# Patient Record
Sex: Female | Born: 1945
Health system: Southern US, Community
[De-identification: ages and names within clinical notes are randomized; demographics above are authoritative.]

## PROBLEM LIST (undated history)

## (undated) DIAGNOSIS — I340 Nonrheumatic mitral (valve) insufficiency: Secondary | ICD-10-CM

## (undated) DIAGNOSIS — M8580 Other specified disorders of bone density and structure, unspecified site: Secondary | ICD-10-CM

## (undated) DIAGNOSIS — Z9889 Other specified postprocedural states: Secondary | ICD-10-CM

## (undated) DIAGNOSIS — E041 Nontoxic single thyroid nodule: Secondary | ICD-10-CM

## (undated) DIAGNOSIS — Z923 Personal history of irradiation: Secondary | ICD-10-CM

## (undated) DIAGNOSIS — E039 Hypothyroidism, unspecified: Secondary | ICD-10-CM

## (undated) HISTORY — DX: Nontoxic single thyroid nodule: E04.1

## (undated) HISTORY — DX: Other specified disorders of bone density and structure, unspecified site: M85.80

## (undated) HISTORY — PX: BREAST EXCISIONAL BIOPSY: SUR124

## (undated) HISTORY — DX: Hypothyroidism, unspecified: E03.9

---

## 1997-11-05 ENCOUNTER — Other Ambulatory Visit: Admission: RE | Admit: 1997-11-05 | Discharge: 1997-11-05 | Payer: Self-pay | Admitting: Obstetrics and Gynecology

## 1997-12-19 ENCOUNTER — Other Ambulatory Visit: Admission: RE | Admit: 1997-12-19 | Discharge: 1997-12-19 | Payer: Self-pay | Admitting: Obstetrics and Gynecology

## 1998-12-04 ENCOUNTER — Encounter (INDEPENDENT_AMBULATORY_CARE_PROVIDER_SITE_OTHER): Payer: Self-pay

## 1998-12-04 ENCOUNTER — Other Ambulatory Visit: Admission: RE | Admit: 1998-12-04 | Discharge: 1998-12-04 | Payer: Self-pay | Admitting: Obstetrics and Gynecology

## 1999-08-27 ENCOUNTER — Encounter: Admission: RE | Admit: 1999-08-27 | Discharge: 1999-08-27 | Payer: Self-pay | Admitting: Obstetrics and Gynecology

## 1999-08-27 ENCOUNTER — Encounter: Payer: Self-pay | Admitting: Obstetrics and Gynecology

## 2000-08-30 ENCOUNTER — Encounter: Payer: Self-pay | Admitting: Obstetrics and Gynecology

## 2000-08-30 ENCOUNTER — Encounter: Admission: RE | Admit: 2000-08-30 | Discharge: 2000-08-30 | Payer: Self-pay | Admitting: Obstetrics and Gynecology

## 2001-09-01 ENCOUNTER — Encounter: Payer: Self-pay | Admitting: Obstetrics and Gynecology

## 2001-09-01 ENCOUNTER — Encounter: Admission: RE | Admit: 2001-09-01 | Discharge: 2001-09-01 | Payer: Self-pay | Admitting: Obstetrics and Gynecology

## 2002-04-17 ENCOUNTER — Other Ambulatory Visit: Admission: RE | Admit: 2002-04-17 | Discharge: 2002-04-17 | Payer: Self-pay | Admitting: Obstetrics and Gynecology

## 2002-09-06 ENCOUNTER — Encounter: Admission: RE | Admit: 2002-09-06 | Discharge: 2002-09-06 | Payer: Self-pay | Admitting: Obstetrics and Gynecology

## 2002-09-06 ENCOUNTER — Encounter: Payer: Self-pay | Admitting: Obstetrics and Gynecology

## 2002-09-08 ENCOUNTER — Encounter: Admission: RE | Admit: 2002-09-08 | Discharge: 2002-09-08 | Payer: Self-pay | Admitting: Obstetrics and Gynecology

## 2002-09-08 ENCOUNTER — Encounter: Payer: Self-pay | Admitting: Obstetrics and Gynecology

## 2002-11-06 ENCOUNTER — Encounter: Payer: Self-pay | Admitting: Family Medicine

## 2002-11-06 ENCOUNTER — Encounter: Admission: RE | Admit: 2002-11-06 | Discharge: 2002-11-06 | Payer: Self-pay | Admitting: Family Medicine

## 2003-09-10 ENCOUNTER — Encounter: Admission: RE | Admit: 2003-09-10 | Discharge: 2003-09-10 | Payer: Self-pay | Admitting: Obstetrics and Gynecology

## 2004-09-10 ENCOUNTER — Encounter: Admission: RE | Admit: 2004-09-10 | Discharge: 2004-09-10 | Payer: Self-pay | Admitting: Obstetrics and Gynecology

## 2004-09-25 ENCOUNTER — Ambulatory Visit (HOSPITAL_COMMUNITY): Admission: RE | Admit: 2004-09-25 | Discharge: 2004-09-25 | Payer: Self-pay | Admitting: Gastroenterology

## 2004-09-25 LAB — HM COLONOSCOPY

## 2005-09-18 ENCOUNTER — Encounter: Admission: RE | Admit: 2005-09-18 | Discharge: 2005-09-18 | Payer: Self-pay | Admitting: Obstetrics and Gynecology

## 2006-09-20 ENCOUNTER — Encounter: Admission: RE | Admit: 2006-09-20 | Discharge: 2006-09-20 | Payer: Self-pay | Admitting: Obstetrics and Gynecology

## 2006-11-10 ENCOUNTER — Inpatient Hospital Stay (HOSPITAL_COMMUNITY): Admission: EM | Admit: 2006-11-10 | Discharge: 2006-11-12 | Payer: Self-pay | Admitting: Emergency Medicine

## 2007-09-27 ENCOUNTER — Encounter: Admission: RE | Admit: 2007-09-27 | Discharge: 2007-09-27 | Payer: Self-pay | Admitting: Obstetrics and Gynecology

## 2007-10-06 ENCOUNTER — Encounter: Admission: RE | Admit: 2007-10-06 | Discharge: 2007-10-06 | Payer: Self-pay | Admitting: Obstetrics and Gynecology

## 2008-09-28 ENCOUNTER — Encounter: Admission: RE | Admit: 2008-09-28 | Discharge: 2008-09-28 | Payer: Self-pay | Admitting: Obstetrics and Gynecology

## 2009-09-30 ENCOUNTER — Encounter: Admission: RE | Admit: 2009-09-30 | Discharge: 2009-09-30 | Payer: Self-pay | Admitting: Obstetrics and Gynecology

## 2010-10-07 NOTE — Consult Note (Signed)
NAME:  Maria Gomez, Maria Gomez NO.:  [id number]   MEDICAL RECORD NO.:  [id number]          PATIENT TYPE:  EMS   LOCATION:  MAJO                         FACILITY:  [facility]   PHYSICIAN:  Ardeth Sportsman, MD     DATE OF BIRTH:  [birth year]   DATE OF CONSULTATION:  DATE OF DISCHARGE:                                 CONSULTATION   PRIMARY CARE PHYSICIAN:  I do not exactly for sure, I think it might be  Dr. Sigmund Hazel.   GYNECOLOGIST:  Dr. Carola Rhine.   REQUESTING PHYSICIAN:  Dr. Perrin Maltese from [facility].   SURGEON:  Dr. Michaell Cowing.   REASON FOR CONSULTATION:  Abdominal pain, possible appendicitis.   HISTORY OF PRESENT ILLNESS:  Maria Gomez is a 65 year old female,  otherwise, healthy who had sudden right lower quadrant abdominal pain  today.  It has become more intense.  It does not appear to radiate.  It  does not seem to get any worse with any or moving or twisting or  turning.  It progressed and based on concern, she went and saw Dr. Perrin Maltese  at the [facility].  He evaluated her and was concerned  about possible appendicitis and, therefore, he requested a consult for  me to evaluate the patient at [facility].   Patient has not had a history of really any abdominal pains before.  She  had a colonoscopy done about 3 years ago by Dr. Wandalee Ferdinand at [facility] and she recalls it being completely negative.  Apparently, her father had issues of bleeding diverticulosis and  stercoral ulcers with impaction and perforation and so was pretty  obsessed in making sure that family members had gotten their  colonoscopies.   Patient denies any sick contacts or travel history.  She has not really  had any nausea or vomiting and her appetite has been slightly down, but  she was able to tolerate food, including a sandwich around 6 hours ago  and actually is hungry right now.  No apparent bouts of hematemesis,  hematochezia or melena.   PAST  MEDICAL HISTORY:  1. Depression.  2. Negative colonoscopy in 2005 per patient.  3. Hypercholesterolemia.  4. Chronic constipation.   PAST SURGICAL HISTORY:  1. She had tonsils removed.  2. She has had left foot surgery.  3. She has had breast biopsies done on the right and on the left      breast.  One in the 1970s, one around 1991.  They were both benign.      She cannot recall the exact order of which one was which.   ALLERGIES:  NONE.   MEDICATIONS:  She takes:  1. Avicor daily.  2. Fish oil daily.  3. Garlic daily.  4. Sertraline 150 mg p.o. daily.  5. Aspirin 81 mg q.a.m.  6. Aspirin 325 mg q.h.s.  7. Prempro.  8. Calcium and vitamin D.  9. __________ to help with chronic constipation.   FAMILY HISTORY:  Notable for her father who had, at age 15, required  urgent colectomy for perforation  x2 secondary to probable obese syndrome  or stercoral ulceration.  No other family history of any other  significant GI problems.   SOCIAL HISTORY:  No tobacco, alcohol or drug use.  Her mother lives next  door to her and they are close.   REVIEW OF SYSTEMS:  As noted per HPI; otherwise, no constitutional,  ophthalmologic, ENT, cardiac, respiratory, GYN are negative.  Noted on  GYN, she had a full Pap smear in the past month and did not show any  abnormalities.  The rest of review of systems, musculoskeletal,  neurological, heme, lymph, hepatic, renal and endocrine, otherwise,  negative.  Heme, lymph and derm negative as well.   PHYSICAL EXAMINATION:  VITAL SIGNS:  Her pulse is in the 50s.  Respirations 16.  Blood pressure 112/73.  Temperature 97.6.  GENERAL:  She is a well-developed, well-nourished, slightly overweight  female in no acute distress.  Pleasant and chatty.  PSYCH:  Pleasant and interactive.  No evidence of any dementia,  delirium, psychosis or paranoia.  HEENT:  Eyes:  Pupils equal, round and reactive to light.  Extraocular  movements are intact.  Sclerae  nonicteric.  They are not injected.  She  is normocephalic with no facial asymmetry.  Mucous membranes are moist  and nasopharynx and oropharynx appear to be clear.  NECK:  Supple without any masses.  Trachea is midline.  LYMPH:  No head, neck, axillary or groin  lymphadenopathy.  BREASTS:  Deferred.  She has had a negative mammogram 2 months ago.  CHEST:  Clear to auscultation bilaterally.  No wheezes, rubs or rhonchi.  No pain to rib or sternal compression.  HEART:  Regular rate and rhythm.  Bradycardic, but no murmur, clicks or  rubs.  ABDOMEN:  Soft.  Slightly overweight.  She has a mild tenderness in the  right lower quadrant, but no rebound, tenderness or guarding to me.  No  periumbilical pain.  GENITAL:  Normal external female genitalia.  I did not do a formal  pelvic exam, given the recent negative pelvic exam.  RECTAL:  Deferred per patient's request.  EXTREMITIES:  No clubbing, cyanosis or edema.  MUSCULOSKELETAL:  Full range of motion of shoulders, elbows, wrists, as  well as hips, knees and ankles.  NEUROLOGICAL:  Cranial nerves II-XII are intact.  Hand grips 5/5 equal  and symmetrical and there is no resting or tension tremors.   STUDIES:  White count is 11.4, which is slightly elevated.  Her  chemistries are, otherwise, unremarkable.  She has a CT scan of the  abdomen from an outside facility, that was actually read with the  radiologist here and we both agree we can see appendix more towards the  midline.  She has sort of a nonadherent cecum in proximal descending  colon.  This is consistent with a cecum bascule.  Appendix is small, but  not inflamed.  Cecum and terminal ileum appear to be normal as well.  Colon is full of stool, but no definite bowel wall thickening concerning  for colitis.  No thickening of the terminal ileum concerning for Crohn.  She has some mild stranding of the proximal ascending colon, but no  definite 100% tic is actually seen.    ASSESSMENT/PLAN:  [name] with a sudden episode of  abdominal pain with the most likely etiology of diverticulitis given  negative GYN etiology and appendix is completely normal.   1. Admit.  2. IV antibiotics.  3. Sips only and  advance diet when white count normalizes and      abdominal pain resolves.  4. Follow transition over to oral antibiotics in the next day or so if      she makes rapid improvements.  5. Consider a repeat CT scan of the abdomen and pelvis with antero and      IV contrast in a couple of days if there is no significant      improvement.  6. Hypertension.  We will monitor closely.  7. Bradycardia.  We will get an EKG, although no evidence of a      myocardial infarction.  8. Serial abdominal exams and follow expectantly.   Anatomy and physiology of the digestive tract was explained.  Pathology  and etiology of diverticulosis and differential diagnoses explained as  well.  She and her mother are in agreement with this plan.      Ardeth Sportsman, MD  Electronically Signed     SCG/MEDQ  D:  11/10/2006  T:  11/11/2006  Job:  [id number]

## 2010-10-07 NOTE — Discharge Summary (Signed)
NAME:  HARTLEE, AMEDEE NO.:  [id number]   MEDICAL RECORD NO.:  [id number]          PATIENT TYPE:  INP   LOCATION:  5731                         FACILITY:  [facility]   PHYSICIAN:  Anselm Pancoast. Weatherly, M.D.DATE OF BIRTH:  1945/06/27   DATE OF ADMISSION:  11/10/2006  DATE OF DISCHARGE:  11/12/2006                               DISCHARGE SUMMARY   DISCHARGE DIAGNOSIS:  Probably a cecal diverticulitis.   OPERATIONS:  None.   HISTORY:  Maria Gomez is a 65 year old female otherwise healthy who  had a sudden onset of right lower abdominal pain on June 18, after a  bowel movement.  It does not radiate.  It did not appear to be getting  any worse.  And, she was seen by Dr. Perrin Maltese and a PA [facility].  He evaluated her and was concerned about a possible appendicitis  and requested a surgical consultation.  Dr. Michaell Cowing saw her in the  emergency room at [facility].  She has a past history of a negative  colonoscopy in 2006, by Dr. Evette Cristal, some history of chronic constipation,  and her evaluation was described as soft, slightly overweight, mild  tenderness in the right lower quadrant, no rebound, and no real obvious  surgical peritoneal signs.  Rectal and genitalia were described as  normal.  There was not actually a rectal exam at the patient's request.  The patient had a CT and the CT was described as diverticulitis of the  cecum, not an appendicitis.  And, she was admitted, placed on IV  antibiotics.  The described as possibly a kind of an ascending colitis  and then the following day she was seen by Dr. Carolynne Edouard, clinically was  improving, and a repeat white count was 8000, where her white count in  the [facility] had been 11,400.  She has been on now a liquid diet, is  tolerating it with no abdominal symptoms, and is ready for discharge at  this time.   She is going to be discharged on oral antibiotics, Cipro 500 mg b.i.d.  for 1 week and Flagyl 500 mg q.i.d.  for 1 week.  No pain medication.  She is instructed to get some MiraLax and if she is getting any  consistency of basically a firm stool to prevent it with the MiraLax.  She will be on low residue diet and will return to be seen in our office  to be seen either by Dr. Michaell Cowing.  Dr. Abbey Chatters did her father's  colectomy, and I could see her as needed.   I have not seen the CT but clinically she is fine and understands that  if she is having increasing pain she will call to be seen sooner.   I have also talked with Dr. Evette Cristal he did her colonoscopy 2 years ago and  the colonoscopy at that time described a perfectly normal cecum with no  evidence of any diverticulitis or questionable findings in the cecum of  2 years earlier.   She is discharged in improved condition and can return to normal work  and over the next 48 hours will advance her diet from full liquids to a  low residue diet.           ______________________________  Anselm Pancoast. Zachery Dakins, M.D.     WJW/MEDQ  D:  11/12/2006  T:  11/12/2006  Job:  [id number]   cc:   Ardeth Sportsman, MD  Graylin Shiver, M.D.

## 2010-10-10 NOTE — Op Note (Signed)
NAME:  Maria Gomez, Maria Gomez              ACCOUNT NO.:  [id number]   MEDICAL RECORD NO.:  [id number]          PATIENT TYPE:  AMB   LOCATION:  ENDO                         FACILITY:  [facility]   PHYSICIAN:  Graylin Shiver, M.D.   DATE OF BIRTH:  1945/07/19   DATE OF PROCEDURE:  09/25/2004  DATE OF DISCHARGE:                                 OPERATIVE REPORT   PROCEDURE:  Colonoscopy.   INDICATIONS:  Family history of colorectal cancer.   Informed consent was obtained after explanation of the risks of bleeding,  infection and perforation.   PREMEDICATION:  Fentanyl 60 mcg IV, Versed 6 milligrams IV.   PROCEDURE:  With the patient in the left lateral decubitus position, a  rectal exam was performed. No masses were felt. The Olympus colonoscope was  inserted into the rectum and advanced around the colon to the cecum.  Cecal  landmarks were identified.  The cecum and ascending colon looked normal. The  transverse colon looked normal. The descending colon looked normal. There  were a few diverticula in the sigmoid region. The rectum looked normal. She  tolerated the procedure well without complications.   IMPRESSION:  Diverticulosis.   PLAN:  I would recommend a follow-up colonoscopy again in five years due to  the family history.      SFG/MEDQ  D:  09/25/2004  T:  09/25/2004  Job:  [id number]   cc:   Sigmund Hazel, M.D.  [address]  [address] [place], [place] [address]  Fax: [phone]

## 2010-11-25 ENCOUNTER — Other Ambulatory Visit: Payer: Self-pay | Admitting: Physician Assistant

## 2010-11-25 DIAGNOSIS — Z1231 Encounter for screening mammogram for malignant neoplasm of breast: Secondary | ICD-10-CM

## 2010-12-08 ENCOUNTER — Ambulatory Visit
Admission: RE | Admit: 2010-12-08 | Discharge: 2010-12-08 | Disposition: A | Discharge status: Home / Self Care | Payer: Medicare Other | Source: Ambulatory Visit | Attending: Physician Assistant | Admitting: Physician Assistant

## 2010-12-08 DIAGNOSIS — Z1231 Encounter for screening mammogram for malignant neoplasm of breast: Secondary | ICD-10-CM

## 2011-03-11 LAB — CREATININE, SERUM: Creatinine, Ser: 1.05

## 2011-03-11 LAB — CBC
HCT: 37.9
MCHC: 34.2
RDW: 12.8
WBC: 8

## 2011-03-11 LAB — POTASSIUM: Potassium: 4.5

## 2012-03-22 DIAGNOSIS — Z0000 Encounter for general adult medical examination without abnormal findings: Secondary | ICD-10-CM | POA: Diagnosis not present

## 2012-03-22 DIAGNOSIS — E039 Hypothyroidism, unspecified: Secondary | ICD-10-CM | POA: Diagnosis not present

## 2012-03-22 DIAGNOSIS — E782 Mixed hyperlipidemia: Secondary | ICD-10-CM | POA: Diagnosis not present

## 2012-03-22 DIAGNOSIS — E559 Vitamin D deficiency, unspecified: Secondary | ICD-10-CM | POA: Diagnosis not present

## 2012-04-08 DIAGNOSIS — Z0000 Encounter for general adult medical examination without abnormal findings: Secondary | ICD-10-CM | POA: Diagnosis not present

## 2012-04-12 ENCOUNTER — Other Ambulatory Visit: Payer: Self-pay | Admitting: Physician Assistant

## 2012-04-12 DIAGNOSIS — Z780 Asymptomatic menopausal state: Secondary | ICD-10-CM

## 2012-04-13 ENCOUNTER — Other Ambulatory Visit: Payer: Self-pay | Admitting: Physician Assistant

## 2012-04-13 DIAGNOSIS — Z1231 Encounter for screening mammogram for malignant neoplasm of breast: Secondary | ICD-10-CM

## 2012-04-19 ENCOUNTER — Other Ambulatory Visit: Payer: Medicare Other

## 2012-06-02 ENCOUNTER — Ambulatory Visit
Admission: RE | Admit: 2012-06-02 | Discharge: 2012-06-02 | Disposition: A | Discharge status: Home / Self Care | Payer: Medicare Other | Source: Ambulatory Visit | Attending: Physician Assistant | Admitting: Physician Assistant

## 2012-06-02 DIAGNOSIS — M949 Disorder of cartilage, unspecified: Secondary | ICD-10-CM | POA: Diagnosis not present

## 2012-06-02 DIAGNOSIS — Z1231 Encounter for screening mammogram for malignant neoplasm of breast: Secondary | ICD-10-CM

## 2012-06-02 DIAGNOSIS — Z780 Asymptomatic menopausal state: Secondary | ICD-10-CM

## 2012-06-02 DIAGNOSIS — M899 Disorder of bone, unspecified: Secondary | ICD-10-CM | POA: Diagnosis not present

## 2012-12-01 ENCOUNTER — Other Ambulatory Visit: Payer: Self-pay | Admitting: Physician Assistant

## 2012-12-07 ENCOUNTER — Telehealth: Payer: Self-pay | Admitting: Physician Assistant

## 2012-12-07 MED ORDER — LEVOTHYROXINE SODIUM 25 MCG PO TABS: 25.0000 ug | ORAL_TABLET | Freq: Every day | ORAL | Status: DC

## 2012-12-07 NOTE — Telephone Encounter (Signed)
Ok to refill to last until CPE.

## 2012-12-07 NOTE — Telephone Encounter (Signed)
Medication refilled per protocol. 

## 2013-04-10 DIAGNOSIS — Z23 Encounter for immunization: Secondary | ICD-10-CM | POA: Diagnosis not present

## 2013-04-16 ENCOUNTER — Other Ambulatory Visit: Payer: Self-pay | Admitting: Family Medicine

## 2013-04-16 MED ORDER — LEVOTHYROXINE SODIUM 25 MCG PO TABS: 25.0000 ug | ORAL_TABLET | Freq: Every day | ORAL | Status: DC

## 2013-04-16 NOTE — Telephone Encounter (Signed)
.  Rx Refilled to [facility]

## 2013-05-15 ENCOUNTER — Other Ambulatory Visit: Payer: Self-pay

## 2013-05-15 DIAGNOSIS — Z1231 Encounter for screening mammogram for malignant neoplasm of breast: Secondary | ICD-10-CM

## 2013-06-13 ENCOUNTER — Ambulatory Visit
Admission: RE | Admit: 2013-06-13 | Discharge: 2013-06-13 | Disposition: A | Discharge status: Home / Self Care | Payer: Medicare Other | Source: Ambulatory Visit

## 2013-06-13 DIAGNOSIS — Z1231 Encounter for screening mammogram for malignant neoplasm of breast: Secondary | ICD-10-CM

## 2013-06-14 ENCOUNTER — Other Ambulatory Visit: Payer: Self-pay | Admitting: Physician Assistant

## 2013-06-14 DIAGNOSIS — R928 Other abnormal and inconclusive findings on diagnostic imaging of breast: Secondary | ICD-10-CM

## 2013-06-22 ENCOUNTER — Other Ambulatory Visit: Payer: Medicare Other

## 2013-06-22 DIAGNOSIS — E785 Hyperlipidemia, unspecified: Secondary | ICD-10-CM | POA: Diagnosis not present

## 2013-06-22 DIAGNOSIS — Z0000 Encounter for general adult medical examination without abnormal findings: Secondary | ICD-10-CM

## 2013-06-22 DIAGNOSIS — M8580 Other specified disorders of bone density and structure, unspecified site: Secondary | ICD-10-CM

## 2013-06-22 DIAGNOSIS — Z79899 Other long term (current) drug therapy: Secondary | ICD-10-CM | POA: Diagnosis not present

## 2013-06-22 DIAGNOSIS — E039 Hypothyroidism, unspecified: Secondary | ICD-10-CM

## 2013-06-22 DIAGNOSIS — E569 Vitamin deficiency, unspecified: Secondary | ICD-10-CM

## 2013-06-22 LAB — CBC WITH DIFFERENTIAL/PLATELET
BASOS ABS: 0 10*3/uL (ref 0.0–0.1)
BASOS PCT: 0 % (ref 0–1)
EOS ABS: 0.1 10*3/uL (ref 0.0–0.7)
EOS PCT: 1 % (ref 0–5)
HEMATOCRIT: 44.6 % (ref 36.0–46.0)
Hemoglobin: 15.2 g/dL — ABNORMAL HIGH (ref 12.0–15.0)
LYMPHS ABS: 1.9 10*3/uL (ref 0.7–4.0)
Lymphocytes Relative: 27 % (ref 12–46)
MCH: 30.7 pg (ref 26.0–34.0)
MCHC: 34.1 g/dL (ref 30.0–36.0)
MCV: 90.1 fL (ref 78.0–100.0)
MONO ABS: 0.5 10*3/uL (ref 0.1–1.0)
Monocytes Relative: 7 % (ref 3–12)
Neutro Abs: 4.6 10*3/uL (ref 1.7–7.7)
Neutrophils Relative %: 65 % (ref 43–77)
PLATELETS: 294 10*3/uL (ref 150–400)
RBC: 4.95 MIL/uL (ref 3.87–5.11)
RDW: 13.9 % (ref 11.5–15.5)
WBC: 7.1 10*3/uL (ref 4.0–10.5)

## 2013-06-22 LAB — LIPID PANEL
CHOL/HDL RATIO: 5.1 ratio
CHOLESTEROL: 257 mg/dL — AB (ref 0–200)
HDL: 50 mg/dL (ref >39)
LDL Cholesterol: 166 mg/dL — ABNORMAL HIGH (ref 0–99)
TRIGLYCERIDES: 204 mg/dL — AB (ref <150)
VLDL: 41 mg/dL — ABNORMAL HIGH (ref 0–40)

## 2013-06-22 LAB — COMPLETE METABOLIC PANEL WITH GFR
ALK PHOS: 82 U/L (ref 39–117)
ALT: 15 U/L (ref 0–35)
AST: 12 U/L (ref 0–37)
Albumin: 4.4 g/dL (ref 3.5–5.2)
BILIRUBIN TOTAL: 0.5 mg/dL (ref 0.2–1.2)
BUN: 19 mg/dL (ref 6–23)
CO2: 27 mEq/L (ref 19–32)
Calcium: 9.9 mg/dL (ref 8.4–10.5)
Chloride: 104 mEq/L (ref 96–112)
Creat: 1.05 mg/dL (ref 0.50–1.10)
GFR, EST AFRICAN AMERICAN: 64 mL/min
GFR, EST NON AFRICAN AMERICAN: 55 mL/min — AB
Glucose, Bld: 99 mg/dL (ref 70–99)
POTASSIUM: 4.9 meq/L (ref 3.5–5.3)
SODIUM: 139 meq/L (ref 135–145)
Total Protein: 7.4 g/dL (ref 6.0–8.3)

## 2013-06-22 LAB — TSH: TSH: 3.125 u[IU]/mL (ref 0.350–4.500)

## 2013-06-23 ENCOUNTER — Ambulatory Visit
Admission: RE | Admit: 2013-06-23 | Discharge: 2013-06-23 | Disposition: A | Discharge status: Home / Self Care | Payer: Medicare Other | Source: Ambulatory Visit | Attending: Physician Assistant | Admitting: Physician Assistant

## 2013-06-23 ENCOUNTER — Other Ambulatory Visit: Payer: Self-pay | Admitting: Physician Assistant

## 2013-06-23 DIAGNOSIS — R922 Inconclusive mammogram: Secondary | ICD-10-CM | POA: Diagnosis not present

## 2013-06-23 DIAGNOSIS — R928 Other abnormal and inconclusive findings on diagnostic imaging of breast: Secondary | ICD-10-CM

## 2013-06-26 ENCOUNTER — Ambulatory Visit (INDEPENDENT_AMBULATORY_CARE_PROVIDER_SITE_OTHER): Payer: Medicare Other | Admitting: Physician Assistant

## 2013-06-26 ENCOUNTER — Encounter: Payer: Self-pay | Admitting: Physician Assistant

## 2013-06-26 VITALS — BP 118/78 | HR 72 | Temp 98.1°F | Resp 18 | Ht 61.25 in | Wt 162.0 lb

## 2013-06-26 DIAGNOSIS — M949 Disorder of cartilage, unspecified: Secondary | ICD-10-CM

## 2013-06-26 DIAGNOSIS — E039 Hypothyroidism, unspecified: Secondary | ICD-10-CM

## 2013-06-26 DIAGNOSIS — M899 Disorder of bone, unspecified: Secondary | ICD-10-CM | POA: Diagnosis not present

## 2013-06-26 DIAGNOSIS — Z23 Encounter for immunization: Secondary | ICD-10-CM | POA: Diagnosis not present

## 2013-06-26 DIAGNOSIS — Z0000 Encounter for general adult medical examination without abnormal findings: Secondary | ICD-10-CM

## 2013-06-26 DIAGNOSIS — M8580 Other specified disorders of bone density and structure, unspecified site: Secondary | ICD-10-CM

## 2013-06-26 NOTE — Progress Notes (Signed)
Patient ID: Maria Gomez MRN: [id number], DOB: 1945-05-30, 68 y.o. Date of Encounter: 06/26/2013,   Chief Complaint: Physical (CPE)  HPI: 68 y.o. y/o female  here for CPE.   She has no complaints today. She did recently have a mammogram and is to go back this Friday for a biopsy. Otherwise, she has had no other medical issues over the past year since her last visit here.   Review of Systems: Consitutional: No fever, chills, fatigue, night sweats, lymphadenopathy. No significant/unexplained weight changes. Eyes: No visual changes, eye redness, or discharge. ENT/Mouth: No ear pain, sore throat, nasal drainage, or sinus pain. Cardiovascular: No chest pressure,heaviness, tightness or squeezing, even with exertion. No increased shortness of breath or dyspnea on exertion.No palpitations, edema, orthopnea, PND. Respiratory: No cough, hemoptysis, SOB, or wheezing. Gastrointestinal: No anorexia, dysphagia, reflux, pain, nausea, vomiting, hematemesis, diarrhea, constipation, BRBPR, or melena. Breast: No mass, nodules, bulging, or retraction. No skin changes or inflammation. No nipple discharge. No lymphadenopathy. Genitourinary: No dysuria, hematuria, incontinence, vaginal discharge, pruritis, burning, abnormal bleeding, or pain. Musculoskeletal: No decreased ROM, No joint pain or swelling. No significant pain in neck, back, or extremities. Skin: No rash, pruritis, or concerning lesions. Neurological: No headache, dizziness, syncope, seizures, tremors, memory loss, coordination problems, or paresthesias. Psychological: No anxiety, depression, hallucinations, SI/HI. Endocrine: No polydipsia, polyphagia, polyuria, or known diabetes.No increased fatigue. No palpitations/rapid heart rate. No significant/unexplained weight change. All other systems were reviewed and are otherwise negative.  Past Medical History  Diagnosis Date  . Hypothyroidism   . Osteopenia      History reviewed. No  pertinent past surgical history.  Home Meds:  Current Outpatient Prescriptions on File Prior to Visit  Medication Sig Dispense Refill  . levothyroxine (SYNTHROID, LEVOTHROID) 25 MCG tablet Take 1 tablet (25 mcg total) by mouth daily before breakfast.  90 tablet  1   No current facility-administered medications on file prior to visit.    Allergies:  Allergies  Allergen Reactions  . Niaspan [Niacin Er] Other (See Comments)    Severe flushing    History   Social History  . Marital Status: Married    Spouse Name: N/A    Number of Children: N/A  . Years of Education: N/A   Occupational History  . Not on file.   Social History Main Topics  . Smoking status: Never Smoker   . Smokeless tobacco: Never Used  . Alcohol Use: No  . Drug Use: No  . Sexual Activity: Not on file   Other Topics Concern  . Not on file   Social History Narrative   Lives with her mom.    Takes care of aunt who lives across the street.    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          Family History  Problem Relation Age of Onset  . Cancer Brother 19    ColoRectal Cancer    Physical Exam: Blood pressure 118/78, pulse 72, temperature 98.1 F (36.7 C), temperature source Oral, resp. rate 18, height 5' 1.25" (1.556 m), weight 162 lb (73.483 kg)., Body mass index is 30.35 kg/(m^2). General: Well developed, well nourished, WF. Appears  in no acute distress. HEENT: Normocephalic, atraumatic. Conjunctiva pink, sclera non-icteric. Pupils 2 mm constricting to 1 mm, round, regular, and equally reactive to light and accomodation. EOMI. Internal auditory canal clear. TMs with good cone of light and without pathology. Nasal mucosa pink. Nares are without  discharge. No sinus tenderness. Oral mucosa pink.  Pharynx without exudate.   Neck: Supple. Trachea midline. No thyromegaly. Full ROM. No lymphadenopathy.No Carotid Bruits. Lungs: Clear to auscultation bilaterally without wheezes,  rales, or rhonchi. Breathing is of normal effort and unlabored. Cardiovascular: RRR with S1 S2. No murmurs, rubs, or gallops. Distal pulses 2+ symmetrically. No carotid or abdominal bruits. Breast: Symmetrical. No masses. Nipples without discharge. Abdomen: Soft, non-tender, non-distended with normoactive bowel sounds. No hepatosplenomegaly or masses. No rebound/guarding. No CVA tenderness. No hernias.  Genitourinary:  External genitalia without lesions. Vaginal mucosa pink.No discharge present. Cervix pink and without discharge. No cervical tenderness.Normal uterus size. No adnexal mass or tenderness.  Musculoskeletal: Full range of motion and 5/5 strength throughout. Without swelling, atrophy, tenderness, crepitus, or warmth. Extremities without clubbing, cyanosis, or edema. Calves supple. Skin: Warm and moist without erythema, ecchymosis, wounds, or rash. Neuro: A+Ox3. CN II-XII grossly intact. Moves all extremities spontaneously. Full sensation throughout. Normal gait. DTR 2+ throughout upper and lower extremities. Finger to nose intact. Psych:  Responds to questions appropriately with a normal affect.   Assessment/Plan:  67 y.o. y/o female here for CPE 1. Visit for preventive health examination   A. Screening Labs:  Results for orders placed in visit on 06/22/13  TSH      Result Value Range   TSH 3.125  0.350 - 4.500 uIU/mL  LIPID PANEL      Result Value Range   Cholesterol 257 (*) 0 - 200 mg/dL   Triglycerides 204 (*) <150 mg/dL   HDL 50  >39 mg/dL   Total CHOL/HDL Ratio 5.1     VLDL 41 (*) 0 - 40 mg/dL   LDL Cholesterol 166 (*) 0 - 99 mg/dL  CBC WITH DIFFERENTIAL      Result Value Range   WBC 7.1  4.0 - 10.5 K/uL   RBC 4.95  3.87 - 5.11 MIL/uL   Hemoglobin 15.2 (*) 12.0 - 15.0 g/dL   HCT 44.6  36.0 - 46.0 %   MCV 90.1  78.0 - 100.0 fL   MCH 30.7  26.0 - 34.0 pg   MCHC 34.1  30.0 - 36.0 g/dL   RDW 13.9  11.5 - 15.5 %   Platelets 294  150 - 400 K/uL   Neutrophils Relative  % 65  43 - 77 %   Neutro Abs 4.6  1.7 - 7.7 K/uL   Lymphocytes Relative 27  12 - 46 %   Lymphs Abs 1.9  0.7 - 4.0 K/uL   Monocytes Relative 7  3 - 12 %   Monocytes Absolute 0.5  0.1 - 1.0 K/uL   Eosinophils Relative 1  0 - 5 %   Eosinophils Absolute 0.1  0.0 - 0.7 K/uL   Basophils Relative 0  0 - 1 %   Basophils Absolute 0.0  0.0 - 0.1 K/uL   Smear Review Criteria for review not met    COMPLETE METABOLIC PANEL WITH GFR      Result Value Range   Sodium 139  135 - 145 mEq/L   Potassium 4.9  3.5 - 5.3 mEq/L   Chloride 104  96 - 112 mEq/L   CO2 27  19 - 32 mEq/L   Glucose, Bld 99  70 - 99 mg/dL   BUN 19  6 - 23 mg/dL   Creat 1.05  0.50 - 1.10 mg/dL   Total Bilirubin 0.5  0.2 - 1.2 mg/dL   Alkaline Phosphatase 82  39 - 117   U/L   AST 12  0 - 37 U/L   ALT 15  0 - 35 U/L   Total Protein 7.4  6.0 - 8.3 g/dL   Albumin 4.4  3.5 - 5.2 g/dL   Calcium 9.9  8.4 - 10.5 mg/dL   GFR, Est African American 64     GFR, Est Non African American 55 (*)     Cholesterol numbers are the same as they were at the last check. She is kind of borderline of considering statin therapy. Discussed this with her. Will continue to try to decrease saturated fats in the diet and continue her walking.  B. Pap: Last Pap smear was performed here 02/27/2011. Cytology was normal. There was no HPV cotesting.  Latest guidelines state that no further pap indicated after age 65, as last pap nml and no h/o abnormality.   C. Screening Mammogram: Patient recently had mammogram which showed some abnormality. She is scheduled for followup biopsy this Friday.  D. DEXA/BMD:  At her last physical she had never had a DEXA/BMD. Placed referral for this and she had this performed 06/02/2012.  Showed osteopenia in both sites.  Lumbar spine: T score: -1.7 Left hip: T score: -2.1 See # 3 below.   E. Colorectal Cancer Screening: Last colonoscopy was performed by Dr. Ganem 09/25/2004. I had her sign a release to try to obtain records  at the last visit but did not get the records.  Today I have told her to call Dr. Ganem's office to determine when followup is due.   She has a brother who died of colorectal cancer at age 57. Therefore I think she is probably due for followup. Again, I discussed this with her today and told her to call there for followup.  F. Immunizations:  Influenza: She reports that she has already gotten the influenza vaccine for this season. Tetanus: Up-to-date. Given 04/08/2012 Pneumococcal: Pneumovax was given 02/2011                              Will give Prevnar 13 today.  Zostavax:  She has received this 05/20/2012    2. Hypothyroidism Recent TSH is within normal limits. Continue current dose.  3. Osteopenia She does report that her mother has osteoporosis. Discussed with her her mother has had any fractures. He says her mother has only had an ankle fracture. Says that this occurred after she fell on a wet floor and experienced a hard fall. Patient does not smoke. She has been on no chronic steroid treatment. She is now doing weight-bearing exercise and walking 1 mile daily. She is on calcium and vitamin D. We can wait and monitor this and hold off on starting any prescription medications right now.  4. Need for prophylactic vaccination against Streptococcus pneumoniae (pneumococcus) - Pneumococcal conjugate vaccine 13-valent   Signed, Mary Beth Dixon, PA, BSFM 06/26/2013 2:09 PM    

## 2013-06-29 ENCOUNTER — Ambulatory Visit
Admission: RE | Admit: 2013-06-29 | Discharge: 2013-06-29 | Disposition: A | Discharge status: Home / Self Care | Payer: Medicare Other | Source: Ambulatory Visit | Attending: Physician Assistant | Admitting: Physician Assistant

## 2013-06-29 DIAGNOSIS — R928 Other abnormal and inconclusive findings on diagnostic imaging of breast: Secondary | ICD-10-CM

## 2013-06-29 DIAGNOSIS — N6019 Diffuse cystic mastopathy of unspecified breast: Secondary | ICD-10-CM | POA: Diagnosis not present

## 2013-06-29 DIAGNOSIS — N6029 Fibroadenosis of unspecified breast: Secondary | ICD-10-CM | POA: Diagnosis not present

## 2013-09-06 ENCOUNTER — Other Ambulatory Visit: Payer: Self-pay | Admitting: Physician Assistant

## 2013-09-06 NOTE — Telephone Encounter (Signed)
Medication refilled per protocol. 

## 2013-10-27 ENCOUNTER — Encounter: Payer: Self-pay | Admitting: Family Medicine

## 2013-11-06 ENCOUNTER — Ambulatory Visit: Payer: Medicare Other | Admitting: Physician Assistant

## 2013-11-08 ENCOUNTER — Ambulatory Visit: Payer: Medicare Other | Admitting: Physician Assistant

## 2013-11-13 ENCOUNTER — Ambulatory Visit (INDEPENDENT_AMBULATORY_CARE_PROVIDER_SITE_OTHER): Payer: Medicare Other | Admitting: Physician Assistant

## 2013-11-13 ENCOUNTER — Encounter: Payer: Self-pay | Admitting: Physician Assistant

## 2013-11-13 VITALS — BP 108/76 | HR 60 | Temp 98.5°F | Resp 18 | Wt 162.0 lb

## 2013-11-13 DIAGNOSIS — K921 Melena: Secondary | ICD-10-CM

## 2013-11-13 DIAGNOSIS — K5900 Constipation, unspecified: Secondary | ICD-10-CM

## 2013-11-13 DIAGNOSIS — Z800 Family history of malignant neoplasm of digestive organs: Secondary | ICD-10-CM

## 2013-11-13 NOTE — Progress Notes (Signed)
Patient ID: Maria Gomez MRN: [id number], DOB: Oct 14, 1945, 68 y.o. Date of Encounter: 11/13/2013, 12:57 PM    Chief Complaint:  Chief Complaint  Patient presents with  . thinks she has colon cancer    prob w/constipation, bloody stools, "pencil like stools",      HPI: 68 y.o. year old white female with the above complaint.  She says that she has been noticing these changes over the last one to 2 months. Says that back around 2 months ago she had a period of time when her stools were thin  and " pencil-like."     Says that they are now back to normal caliber since she has adjusted her diet and is eating more spinach and beans. Says that she also was having some problems with constipation. Says about 2 weeks ago she had a episode where she had a real difficult time with her bowel movement. Says that when she finally got the stool to come out she saw some red blood. Because of all of this she was concerned that she could have colon cancer. She has a brother who died of colon cancer.  says that he was diagnosed with colon cancer around age 50. She has had no melena. No maroon colored stools and no black tarry stools. Has not seen red blood except for that one time. Has had no lightheadedness.     Home Meds:   Outpatient Prescriptions Prior to Visit  Medication Sig Dispense Refill  . Biotin 1 MG CAPS Take 1 capsule by mouth daily.      . Calcium Carbonate-Vitamin D (CALCIUM + D PO) Take 500 mg by mouth daily.      . Coenzyme Q10 (COQ10) 200 MG CAPS Take 1 capsule by mouth daily.      . Cyanocobalamin (CVS B-12) 1500 MCG TBDP Take 1 tablet by mouth daily.      Marland Kitchen levothyroxine (SYNTHROID, LEVOTHROID) 25 MCG tablet TAKE 1 TABLET  BY MOUTH DAILY BEFORE BREAKFAST.  90 tablet  1  . Multiple Vitamin (MULTIVITAMIN) tablet Take 1 tablet by mouth daily. Vitality      . OMEGA-3 KRILL OIL PO Take 1 capsule by mouth daily.      Marland Kitchen OVER THE COUNTER MEDICATION Take 1 tablet by mouth daily.  Cell Wise Broad Spectrum Antioxident      . Probiotic Product (PROBIOTIC DAILY PO) Take 1 capsule by mouth daily. Florify       No facility-administered medications prior to visit.    Allergies:  Allergies  Allergen Reactions  . Niaspan [Niacin Er] Other (See Comments)    Severe flushing      Review of Systems: See HPI for pertinent ROS. All other ROS negative.    Physical Exam: Blood pressure 108/76, pulse 60, temperature 98.5 F (36.9 C), temperature source Oral, resp. rate 18, weight 162 lb (73.483 kg)., Body mass index is 30.35 kg/(m^2). General:  WNWD WF. Appears in no acute distress. Neck: Supple. No thyromegaly. No lymphadenopathy. Lungs: Clear bilaterally to auscultation without wheezes, rales, or rhonchi. Breathing is unlabored. Heart: Regular rhythm. No murmurs, rubs, or gallops. Abdomen: Soft, non-tender, non-distended with normoactive bowel sounds. No hepatomegaly. No rebound/guarding. No obvious abdominal masses. Msk:  Strength and tone normal for age. Extremities/Skin: Warm and dry. No clubbing or cyanosis. No edema. No rashes or suspicious lesions. Neuro: Alert and oriented X 3. Moves all extremities spontaneously. Gait is normal. CNII-XII grossly in tact. Psych:  Responds to questions appropriately with  a normal affect.     ASSESSMENT AND PLAN:  68 y.o. year old female with  1. Family history of colon cancer - Ambulatory referral to Gastroenterology  2. Hematochezia - Ambulatory referral to Gastroenterology  3. Unspecified constipation - Ambulatory referral to Gastroenterology  Last colonoscopy was 09/25/2004 by Dr. Penelope Coop. Colonoscopy report shows diverticulosis. He has on the report that he would recommend followup colonoscopy in 5 years due to her family history. Given this recommendation, she is actually overdue for colonoscopy anyway. Refer her back to Dr. Penelope Coop for followup colonoscopy. I recommended obtaining a CBC today to make sure she has not  developed any anemia but she defers this. She is to follow up immediately if she develops any lightheadedness or further symptoms.  [address] [place], [place], [facility] 11/13/2013 12:57 PM

## 2013-11-21 DIAGNOSIS — K625 Hemorrhage of anus and rectum: Secondary | ICD-10-CM | POA: Diagnosis not present

## 2013-12-14 DIAGNOSIS — L259 Unspecified contact dermatitis, unspecified cause: Secondary | ICD-10-CM | POA: Diagnosis not present

## 2013-12-21 DIAGNOSIS — L259 Unspecified contact dermatitis, unspecified cause: Secondary | ICD-10-CM | POA: Diagnosis not present

## 2013-12-29 ENCOUNTER — Telehealth: Payer: Self-pay | Admitting: Physician Assistant

## 2013-12-29 NOTE — Telephone Encounter (Signed)
Patient is calling to let us know that the [facility] pharmacy needs authorization for her [place], [place] [phone] (Phone) [phone] (Fax)

## 2014-01-01 NOTE — Telephone Encounter (Signed)
Need authorizations please, can't do it because of [email or web address] does not have me on record for [place].

## 2014-01-02 MED ORDER — LEVOTHYROXINE SODIUM 25 MCG PO TABS: ORAL_TABLET | ORAL | Status: DC

## 2014-01-02 NOTE — Telephone Encounter (Signed)
Cal placed to pharmacy.   Was advised that no PA required, patient requesting refill. Stated that refill noted to prescription, but patient had not yet called in to have it delivered.   States that patient must call pharmacy to request delivery at [phone].  Call placed to patient. Maria Gomez.

## 2014-01-11 DIAGNOSIS — K625 Hemorrhage of anus and rectum: Secondary | ICD-10-CM | POA: Diagnosis not present

## 2014-01-11 DIAGNOSIS — Z800 Family history of malignant neoplasm of digestive organs: Secondary | ICD-10-CM | POA: Diagnosis not present

## 2014-01-11 DIAGNOSIS — K5730 Diverticulosis of large intestine without perforation or abscess without bleeding: Secondary | ICD-10-CM | POA: Diagnosis not present

## 2014-02-28 ENCOUNTER — Other Ambulatory Visit: Payer: Self-pay | Admitting: Physician Assistant

## 2014-03-01 NOTE — Telephone Encounter (Signed)
Medication refilled per protocol. 

## 2014-03-29 DIAGNOSIS — Z23 Encounter for immunization: Secondary | ICD-10-CM | POA: Diagnosis not present

## 2014-06-12 ENCOUNTER — Other Ambulatory Visit: Payer: Self-pay

## 2014-06-12 DIAGNOSIS — Z1231 Encounter for screening mammogram for malignant neoplasm of breast: Secondary | ICD-10-CM

## 2014-06-19 ENCOUNTER — Ambulatory Visit: Payer: Medicare Other

## 2014-06-27 ENCOUNTER — Ambulatory Visit
Admission: RE | Admit: 2014-06-27 | Discharge: 2014-06-27 | Disposition: A | Discharge status: Home / Self Care | Payer: Medicare Other | Source: Ambulatory Visit

## 2014-06-27 ENCOUNTER — Other Ambulatory Visit: Payer: Medicare Other

## 2014-06-27 DIAGNOSIS — E785 Hyperlipidemia, unspecified: Secondary | ICD-10-CM

## 2014-06-27 DIAGNOSIS — Z1231 Encounter for screening mammogram for malignant neoplasm of breast: Secondary | ICD-10-CM

## 2014-06-27 DIAGNOSIS — Z0000 Encounter for general adult medical examination without abnormal findings: Secondary | ICD-10-CM | POA: Diagnosis not present

## 2014-06-27 DIAGNOSIS — E039 Hypothyroidism, unspecified: Secondary | ICD-10-CM

## 2014-06-27 DIAGNOSIS — Z79899 Other long term (current) drug therapy: Secondary | ICD-10-CM

## 2014-06-27 DIAGNOSIS — M8580 Other specified disorders of bone density and structure, unspecified site: Secondary | ICD-10-CM

## 2014-06-27 LAB — COMPLETE METABOLIC PANEL WITH GFR
ALT: 14 U/L (ref 0–35)
AST: 12 U/L (ref 0–37)
Albumin: 3.9 g/dL (ref 3.5–5.2)
Alkaline Phosphatase: 74 U/L (ref 39–117)
BILIRUBIN TOTAL: 0.5 mg/dL (ref 0.2–1.2)
BUN: 14 mg/dL (ref 6–23)
CALCIUM: 9.4 mg/dL (ref 8.4–10.5)
CO2: 25 mEq/L (ref 19–32)
CREATININE: 0.98 mg/dL (ref 0.50–1.10)
Chloride: 101 mEq/L (ref 96–112)
GFR, Est African American: 69 mL/min
GFR, Est Non African American: 59 mL/min — ABNORMAL LOW
Glucose, Bld: 92 mg/dL (ref 70–99)
Potassium: 4.2 mEq/L (ref 3.5–5.3)
Sodium: 136 mEq/L (ref 135–145)
Total Protein: 6.7 g/dL (ref 6.0–8.3)

## 2014-06-27 LAB — CBC WITH DIFFERENTIAL/PLATELET
BASOS ABS: 0 10*3/uL (ref 0.0–0.1)
Basophils Relative: 0 % (ref 0–1)
Eosinophils Absolute: 0.1 10*3/uL (ref 0.0–0.7)
Eosinophils Relative: 1 % (ref 0–5)
HEMATOCRIT: 44 % (ref 36.0–46.0)
Hemoglobin: 14.7 g/dL (ref 12.0–15.0)
Lymphocytes Relative: 22 % (ref 12–46)
Lymphs Abs: 1.9 10*3/uL (ref 0.7–4.0)
MCH: 30.8 pg (ref 26.0–34.0)
MCHC: 33.4 g/dL (ref 30.0–36.0)
MCV: 92.1 fL (ref 78.0–100.0)
MPV: 9.9 fL (ref 8.6–12.4)
Monocytes Absolute: 0.4 10*3/uL (ref 0.1–1.0)
Monocytes Relative: 5 % (ref 3–12)
NEUTROS PCT: 72 % (ref 43–77)
Neutro Abs: 6.1 10*3/uL (ref 1.7–7.7)
PLATELETS: 269 10*3/uL (ref 150–400)
RBC: 4.78 MIL/uL (ref 3.87–5.11)
RDW: 14.2 % (ref 11.5–15.5)
WBC: 8.5 10*3/uL (ref 4.0–10.5)

## 2014-06-27 LAB — LIPID PANEL
CHOL/HDL RATIO: 5.3 ratio
Cholesterol: 259 mg/dL — ABNORMAL HIGH (ref 0–200)
HDL: 49 mg/dL (ref >39)
LDL CALC: 155 mg/dL — AB (ref 0–99)
TRIGLYCERIDES: 277 mg/dL — AB (ref <150)
VLDL: 55 mg/dL — ABNORMAL HIGH (ref 0–40)

## 2014-06-27 LAB — TSH: TSH: 1.885 u[IU]/mL (ref 0.350–4.500)

## 2014-07-04 ENCOUNTER — Encounter: Payer: Self-pay | Admitting: Physician Assistant

## 2014-07-04 ENCOUNTER — Ambulatory Visit (INDEPENDENT_AMBULATORY_CARE_PROVIDER_SITE_OTHER): Payer: Medicare Other | Admitting: Physician Assistant

## 2014-07-04 VITALS — BP 112/70 | HR 60 | Temp 98.2°F | Resp 18 | Ht 62.0 in | Wt 162.0 lb

## 2014-07-04 DIAGNOSIS — J988 Other specified respiratory disorders: Secondary | ICD-10-CM | POA: Diagnosis not present

## 2014-07-04 DIAGNOSIS — E039 Hypothyroidism, unspecified: Secondary | ICD-10-CM | POA: Diagnosis not present

## 2014-07-04 DIAGNOSIS — B9689 Other specified bacterial agents as the cause of diseases classified elsewhere: Secondary | ICD-10-CM

## 2014-07-04 DIAGNOSIS — M8580 Other specified disorders of bone density and structure, unspecified site: Secondary | ICD-10-CM

## 2014-07-04 DIAGNOSIS — Z0000 Encounter for general adult medical examination without abnormal findings: Secondary | ICD-10-CM

## 2014-07-04 MED ORDER — AZITHROMYCIN 250 MG PO TABS: ORAL_TABLET | ORAL | Status: DC

## 2014-07-04 NOTE — Progress Notes (Signed)
Patient ID: Maria Gomez MRN: [id number], DOB: September 09, 1945, 69 y.o. Date of Encounter: 07/04/2014,   Chief Complaint: Physical (CPE)  HPI: 69 y.o. y/o female  here for CPE.   Today she reports that she has had a cough for about one month. Says that she has had no nasal congestion or mucus from the nose.  She has no other complaints today.  Review of Systems: Consitutional: No fever, chills, fatigue, night sweats, lymphadenopathy. No significant/unexplained weight changes. Eyes: No visual changes, eye redness, or discharge. ENT/Mouth: No ear pain, sore throat, nasal drainage, or sinus pain. Cardiovascular: No chest pressure,heaviness, tightness or squeezing, even with exertion. No increased shortness of breath or dyspnea on exertion.No palpitations, edema, orthopnea, PND. Respiratory: No cough, hemoptysis, SOB, or wheezing. Gastrointestinal: No anorexia, dysphagia, reflux, pain, nausea, vomiting, hematemesis, diarrhea, constipation, BRBPR, or melena. Breast: No mass, nodules, bulging, or retraction. No skin changes or inflammation. No nipple discharge. No lymphadenopathy. Genitourinary: No dysuria, hematuria, incontinence, vaginal discharge, pruritis, burning, abnormal bleeding, or pain. Musculoskeletal: No decreased ROM, No joint pain or swelling. No significant pain in neck, back, or extremities. Skin: No rash, pruritis, or concerning lesions. Neurological: No headache, dizziness, syncope, seizures, tremors, memory loss, coordination problems, or paresthesias. Psychological: No anxiety, depression, hallucinations, SI/HI. Endocrine: No polydipsia, polyphagia, polyuria, or known diabetes.No increased fatigue. No palpitations/rapid heart rate. No significant/unexplained weight change. All other systems were reviewed and are otherwise negative.  Past Medical History  Diagnosis Date  . Hypothyroidism   . Osteopenia      History reviewed. No pertinent past surgical  history.  Home Meds:  Current Outpatient Prescriptions on File Prior to Visit  Medication Sig Dispense Refill  . Biotin 1 MG CAPS Take 1 capsule by mouth daily.    . Calcium Carbonate-Vitamin D (CALCIUM + D PO) Take 500 mg by mouth daily.    . Coenzyme Q10 (COQ10) 200 MG CAPS Take 1 capsule by mouth daily.    . Cyanocobalamin (CVS B-12) 1500 MCG TBDP Take 1 tablet by mouth daily.    Marland Kitchen levothyroxine (SYNTHROID, LEVOTHROID) 25 MCG tablet TAKE 1 TABLET EVERY DAY BEFORE BREAKFAST 90 tablet 1  . Multiple Vitamin (MULTIVITAMIN) tablet Take 1 tablet by mouth daily. Vitality    . Probiotic Product (PROBIOTIC DAILY PO) Take 1 capsule by mouth daily. Florify    . OMEGA-3 KRILL OIL PO Take 1 capsule by mouth daily.    Marland Kitchen OVER THE COUNTER MEDICATION Take 1 tablet by mouth daily. Cell Wise Broad Spectrum Antioxident     No current facility-administered medications on file prior to visit.    Allergies:  Allergies  Allergen Reactions  . Niaspan [Niacin Er] Other (See Comments)    Severe flushing    History   Social History  . Marital Status: Married    Spouse Name: N/A  . Number of Children: N/A  . Years of Education: N/A   Occupational History  . Not on file.   Social History Main Topics  . Smoking status: Never Smoker   . Smokeless tobacco: Never Used  . Alcohol Use: No  . Drug Use: No  . Sexual Activity: Not on file   Other Topics Concern  . Not on file   Social History Narrative   Lives with her mom.    Takes care of aunt who lives across the street.    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  Family History  Problem Relation Age of Onset  . Cancer Brother 34    ColoRectal Cancer    Physical Exam: Blood pressure 112/70, pulse 60, temperature 98.2 F (36.8 C), temperature source Oral, resp. rate 18, height '5\' 2"'  (1.575 m), weight 162 lb (73.483 kg)., Body mass index is 29.62 kg/(m^2). General: Well developed, well nourished, WF.  Appears  in no acute distress. HEENT: Normocephalic, atraumatic. Conjunctiva pink, sclera non-icteric. Pupils 2 mm constricting to 1 mm, round, regular, and equally reactive to light and accomodation. EOMI. Internal auditory canal clear. TMs with good cone of light and without pathology. Nasal mucosa pink. Nares are without discharge. No sinus tenderness. Oral mucosa pink.  Pharynx without exudate.   Neck: Supple. Trachea midline. No thyromegaly. Full ROM. No lymphadenopathy.No Carotid Bruits. Lungs: Clear to auscultation bilaterally without wheezes, rales, or rhonchi. Breathing is of normal effort and unlabored. Cardiovascular: RRR with S1 S2. No murmurs, rubs, or gallops. Distal pulses 2+ symmetrically. No carotid or abdominal bruits. Breast: Symmetrical. No masses. Nipples without discharge. Abdomen: Soft, non-tender, non-distended with normoactive bowel sounds. No hepatosplenomegaly or masses. No rebound/guarding. No CVA tenderness. No hernias.  Genitourinary: Pt deferred today. Had Pelvic Exam here 06/2013-pt now 69 y/o. Musculoskeletal: Full range of motion and 5/5 strength throughout. Without swelling, atrophy, tenderness, crepitus, or warmth. Extremities without clubbing, cyanosis, or edema.  Skin: Warm and moist without erythema, ecchymosis, wounds, or rash. Neuro: A+Ox3. CN II-XII grossly intact. Moves all extremities spontaneously. Full sensation throughout. Normal gait. Psych:  Responds to questions appropriately with a normal affect.   Assessment/Plan:  69 y.o. y/o female here for CPE 1. Visit for preventive health examination   A. Screening Labs:  Results for orders placed or performed in visit on 06/27/14  CBC with Differential/Platelet  Result Value Ref Range   WBC 8.5 4.0 - 10.5 K/uL   RBC 4.78 3.87 - 5.11 MIL/uL   Hemoglobin 14.7 12.0 - 15.0 g/dL   HCT 44.0 36.0 - 46.0 %   MCV 92.1 78.0 - 100.0 fL   MCH 30.8 26.0 - 34.0 pg   MCHC 33.4 30.0 - 36.0 g/dL   RDW 14.2 11.5 -  15.5 %   Platelets 269 150 - 400 K/uL   MPV 9.9 8.6 - 12.4 fL   Neutrophils Relative % 72 43 - 77 %   Neutro Abs 6.1 1.7 - 7.7 K/uL   Lymphocytes Relative 22 12 - 46 %   Lymphs Abs 1.9 0.7 - 4.0 K/uL   Monocytes Relative 5 3 - 12 %   Monocytes Absolute 0.4 0.1 - 1.0 K/uL   Eosinophils Relative 1 0 - 5 %   Eosinophils Absolute 0.1 0.0 - 0.7 K/uL   Basophils Relative 0 0 - 1 %   Basophils Absolute 0.0 0.0 - 0.1 K/uL   Smear Review Criteria for review not met   Lipid panel  Result Value Ref Range   Cholesterol 259 (H) 0 - 200 mg/dL   Triglycerides 277 (H) <150 mg/dL   HDL 49 >39 mg/dL   Total CHOL/HDL Ratio 5.3 Ratio   VLDL 55 (H) 0 - 40 mg/dL   LDL Cholesterol 155 (H) 0 - 99 mg/dL  TSH  Result Value Ref Range   TSH 1.885 0.350 - 4.500 uIU/mL  COMPLETE METABOLIC PANEL WITH GFR  Result Value Ref Range   Sodium 136 135 - 145 mEq/L   Potassium 4.2 3.5 - 5.3 mEq/L   Chloride 101 96 - 112 mEq/L  CO2 25 19 - 32 mEq/L   Glucose, Bld 92 70 - 99 mg/dL   BUN 14 6 - 23 mg/dL   Creat 0.98 0.50 - 1.10 mg/dL   Total Bilirubin 0.5 0.2 - 1.2 mg/dL   Alkaline Phosphatase 74 39 - 117 U/L   AST 12 0 - 37 U/L   ALT 14 0 - 35 U/L   Total Protein 6.7 6.0 - 8.3 g/dL   Albumin 3.9 3.5 - 5.2 g/dL   Calcium 9.4 8.4 - 10.5 mg/dL   GFR, Est African American 69 mL/min   GFR, Est Non African American 59 (L) mL/min    Cholesterol numbers are the same as they were at the last check. She is kind of borderline of considering statin therapy. Discussed this with her. Will continue to try to decrease saturated fats in the diet and continue her walking. At [place] 06/2014 she was a little frustrated because she says that every night her mom eats ice cream and her mom frequently eats cake and patient eats neither of these. However at this visit we did discuss the fact that she does eat a lot of cheese and sour cream and casseroles. Discussed trying to decrease these foods. She does not want to start statin therapy  at this time.  B. Pap: Last Pap smear was performed here 02/27/2011. Cytology was normal. There was no HPV cotesting.  Latest guidelines state that no further pap indicated after age 24, as last pap nml and no h/o abnormality.  She had pelvic exam here 06/2013 which was normal. Defers repeat at visit 06/2014 and she is now age 69.  C. Screening Mammogram: She just recently had mammogram 06/27/2014--negative. Repeat one year.  D. DEXA/BMD:  At recent physical she had never had a DEXA/BMD. Placed referral for this and she had this performed 06/02/2012.  Showed osteopenia in both sites.  Lumbar spine: T score: -1.7 Left hip: T score: -2.1 See # 3 below.--" "3 . Osteopenia She does report that her mother has osteoporosis. Discussed with her her mother has had any fractures. He says her mother has only had an ankle fracture. Says that this occurred after she fell on a wet floor and experienced a hard fall. Patient does not smoke. She has been on no chronic steroid treatment. She is now doing weight-bearing exercise and walking 1 mile daily. She is on calcium and vitamin D. We can wait and monitor this and hold off on starting any prescription medications right now.    E. Colorectal Cancer Screening: She had colonoscopy  performed by Dr. Penelope Coop 09/25/2004. She had another colonoscopy performed 01/11/2014. Found this listed in [place] but was unable to find the actual report and results to tell when she is supposed to have repeat.  F. Immunizations:  Influenza: She reports that she has already gotten the influenza vaccine for this season. Tetanus: Up-to-date. Given 04/08/2012 Pneumococcal: Pneumovax was given 02/2011                             Prevnar 13 --given here 06/26/2013  Zostavax:  She has received this 05/20/2012    2. Hypothyroidism Recent TSH is within normal limits. Continue current dose.  3. Osteopenia She does report that her mother has osteoporosis. Discussed with her her mother  has had any fractures. He says her mother has only had an ankle fracture. Says that this occurred after she fell on a wet floor  and experienced a hard fall. Patient does not smoke. She has been on no chronic steroid treatment. She is now doing weight-bearing exercise and walking 1 mile daily. She is on calcium and vitamin D. We can wait and monitor this and hold off on starting any prescription medications right now.   4. Bacterial respiratory infection - azithromycin (ZITHROMAX) 250 MG tablet; Day 1: Take 2 daily.  Days 2-5: Take 1 daily.  Dispense: 6 tablet; Refill: 0   Subjective:   Patient presents for Medicare Annual/Subsequent preventive examination.   Review Past Medical/Family/Social: These are all documented in Epic and are all reviewed today.  Risk Factors  Current exercise habits:  Walking Dietary issues discussed: Discussed decreasing cheese sour cream and casseroles today. See note under lipids above.  Cardiac risk factors: Age, Lipids   Depression Screen  (Note: if answer to either of the following is "Yes", a more complete depression screening is indicated)  Over the past two weeks, have you felt down, depressed or hopeless? No Over the past two weeks, have you felt little interest or pleasure in doing things? No Have you lost interest or pleasure in daily life? No Do you often feel hopeless? No Do you cry easily over simple problems? No   Activities of Daily Living  In your present state of health, do you have any difficulty performing the following activities?:  Driving? No  Managing money? No  Feeding yourself? No  Getting from bed to chair? No  Climbing a flight of stairs? No  Preparing food and eating?: No  Bathing or showering? No  Getting dressed: No  Getting to the toilet? No  Using the toilet:No  Moving around from place to place: No  In the past year have you fallen or had a near fall?:No  Are you sexually active? No  Do you have more than one  partner? No   Hearing Difficulties: No  Do you often ask people to speak up or repeat themselves? No  Do you experience ringing or noises in your ears? No Do you have difficulty understanding soft or whispered voices? No  Do you feel that you have a problem with memory? No Do you often misplace items? No  Do you feel safe at home? Yes  Cognitive Testing  Alert? Yes Normal Appearance?Yes  Oriented to person? Yes Place? Yes  Time? Yes  Recall of three objects? Yes  Can perform simple calculations? Yes  Displays appropriate judgment?Yes  Can read the correct time from a watch face?Yes   List the Names of Other Physician/Practitioners you currently use:  GI--In EPic  Indicate any recent Medical Services you may have received from other than [facility] providers in the past year (date may be approximate).   Screening Tests / Date Colonoscopy --all of this information is listed above.                    Zostavax  Mammogram  Influenza Vaccine  Tetanus/tdap    Assessment:    Annual wellness medicare exam   Plan:    During the course of the visit the patient was educated and counseled about appropriate screening and preventive services including:  Screening mammography  Colorectal cancer screening  Shingles vaccine. Prescription given to that she can get the vaccine at the pharmacy or Medicare part D.  Screen + for depression. PHQ- 9 score of 12 (moderate depression). We discussed the options of counseling versus possibly a medication. I encouraged her strongly think  about the counseling. She is going through some medical problems currently and her husband is as well Mrs. been very stressful for her. She says she will think about it. She does have Xanax to use as needed. Though she may benefit from an SSRI for her more depressive type symptoms but she wants to hold off at this time.  I aksed her to please have her cardioloist send records since we have none on file.  Diet review for  nutrition referral? Yes ____ Not Indicated __x__  Patient Instructions (the written plan) was given to the patient.  Medicare Attestation  I have personally reviewed:  The patient's medical and social history  Their use of alcohol, tobacco or illicit drugs  Their current medications and supplements  The patient's functional ability including ADLs,fall risks, home safety risks, cognitive, and hearing and visual impairment  Diet and physical activities  Evidence for depression or mood disorders  The patient's weight, height, BMI, and visual acuity have been recorded in the chart. I have made referrals, counseling, and provided education to the patient based on review of the above and I have provided the patient with a written personalized care plan for preventive services.        Signed, [address] [place], [place], [facility] 07/04/2014 9:40 AM

## 2014-07-10 ENCOUNTER — Telehealth: Payer: Self-pay | Admitting: Physician Assistant

## 2014-07-10 MED ORDER — LEVOFLOXACIN 750 MG PO TABS: 750.0000 mg | ORAL_TABLET | Freq: Every day | ORAL | Status: DC

## 2014-07-10 NOTE — Telephone Encounter (Signed)
Patient calling to say she is not better from taking her azithromycin would like a call back regarding this   [phone]

## 2014-07-10 NOTE — Telephone Encounter (Signed)
Called patient.  States cough has not improved.  Has gone through [place], [place].  Please advise.

## 2014-07-10 NOTE — Telephone Encounter (Signed)
Levaquin 750mg 1 po QD x 7 days. # 7 + 0. 

## 2014-07-10 NOTE — Telephone Encounter (Signed)
Pt aware of change in ABX.  If still not better will need to see back in office

## 2014-07-17 ENCOUNTER — Other Ambulatory Visit: Payer: Self-pay | Admitting: Physician Assistant

## 2014-07-18 NOTE — Telephone Encounter (Signed)
Medication refilled per protocol. 

## 2014-08-01 ENCOUNTER — Ambulatory Visit
Admission: RE | Admit: 2014-08-01 | Discharge: 2014-08-01 | Disposition: A | Discharge status: Home / Self Care | Payer: Medicare Other | Source: Ambulatory Visit | Attending: Physician Assistant | Admitting: Physician Assistant

## 2014-08-01 ENCOUNTER — Encounter: Payer: Self-pay | Admitting: Physician Assistant

## 2014-08-01 ENCOUNTER — Ambulatory Visit (INDEPENDENT_AMBULATORY_CARE_PROVIDER_SITE_OTHER): Payer: Medicare Other | Admitting: Physician Assistant

## 2014-08-01 ENCOUNTER — Telehealth: Payer: Self-pay | Admitting: *Deleted

## 2014-08-01 ENCOUNTER — Other Ambulatory Visit: Payer: Self-pay | Admitting: Physician Assistant

## 2014-08-01 VITALS — BP 108/78 | HR 76 | Temp 98.2°F | Resp 18 | Wt 162.0 lb

## 2014-08-01 DIAGNOSIS — R05 Cough: Secondary | ICD-10-CM

## 2014-08-01 DIAGNOSIS — R053 Chronic cough: Secondary | ICD-10-CM

## 2014-08-01 DIAGNOSIS — R9389 Abnormal findings on diagnostic imaging of other specified body structures: Secondary | ICD-10-CM

## 2014-08-01 DIAGNOSIS — R059 Cough, unspecified: Secondary | ICD-10-CM

## 2014-08-01 MED ORDER — PREDNISONE 20 MG PO TABS: 20.0000 mg | ORAL_TABLET | Freq: Two times a day (BID) | ORAL | Status: DC

## 2014-08-01 NOTE — Progress Notes (Signed)
Patient ID: BRYNNAN RODENBAUGH MRN: [id number], DOB: 09-24-1945, 69 y.o. Date of Encounter: 08/01/2014, 11:07 AM    Chief Complaint:  Chief Complaint  Patient presents with  . persisitant cough x 3 mth    rt lung very painful     HPI: 69 y.o. year old white female is here for follow-up.  I reviewed my office visit note dated 07/04/14. At that visit she reported that she had been experiencing a cough for about one month. She had no nasal congestion or mucus from the nose, but just congested cough. At that visit my exam showed that her lung exam was normal. At that visit I prescribed azithromycin Z-Pak.  Patient states that she took the Z-Pak as directed but says that she got no relief at all and so no change in her symptoms even after completion of that.  She called [place] on 07/10/14 reporting that she had not gotten symptom relief after completing the Z-Pak. At that time I prescribed Levaquin 750 mg daily 7 days. Patient states that she did take the Levaquin as directed and completed all of it. However, she states that she again saw no decrease in symptoms-- no change at all even after completion of Levaquin.  States that when she is just sitting at rest she feels okay. However says that she gets up to do any type of work at all, she feels pain "in her right lung "  and points underneath her right breast and towards her right back at that same level.  At that time she also starts to cough. Says that she is coughing up no phlegm.  Says that otherwise she does not feel bad. Only feels bad when she gets up to exert herself and develops that pain and cough.  Has had no fevers or chills.     Home Meds:   Outpatient Prescriptions Prior to Visit  Medication Sig Dispense Refill  . Biotin 1 MG CAPS Take 1 capsule by mouth daily.    . Calcium Carbonate-Vitamin D (CALCIUM + D PO) Take 500 mg by mouth daily.    . Coenzyme Q10 (COQ10) 200 MG CAPS Take 1 capsule by mouth daily.    .  Cyanocobalamin (CVS B-12) 1500 MCG TBDP Take 1 tablet by mouth daily.    Marland Kitchen levothyroxine (SYNTHROID, LEVOTHROID) 25 MCG tablet TAKE 1 TABLET EVERY DAY BEFORE BREAKFAST 90 tablet 1  . Multiple Vitamin (MULTIVITAMIN) tablet Take 1 tablet by mouth daily. Vitality    . OMEGA-3 KRILL OIL PO Take 1 capsule by mouth daily.    . Probiotic Product (PROBIOTIC DAILY PO) Take 1 capsule by mouth daily. Florify    . levofloxacin (LEVAQUIN) 750 MG tablet Take 1 tablet (750 mg total) by mouth daily. 7 tablet 0  . OVER THE COUNTER MEDICATION Take 1 tablet by mouth daily. Cell Wise Broad Spectrum Antioxident     No facility-administered medications prior to visit.    Allergies:  Allergies  Allergen Reactions  . Niaspan [Niacin Er] Other (See Comments)    Severe flushing      Review of Systems: See HPI for pertinent ROS. All other ROS negative.    Physical Exam: Blood pressure 108/78, pulse 76, temperature 98.2 F (36.8 C), temperature source Oral, resp. rate 18, weight 162 lb (73.483 kg), SpO2 98 %., Body mass index is 29.62 kg/(m^2). General: WNWD WF.  Appears in no acute distress. Neck: Supple. No thyromegaly. No lymphadenopathy. Lungs: Clear bilaterally to auscultation without wheezes,  rales, or rhonchi. Breathing is unlabored. Lungs are clear with no wheezing or rhonchi. She does cough several times during the visit and it does sound like a deep cough.  (Not a hacky cough that you would usually see with post nasal drip or GERD) Heart: Regular rhythm. No murmurs, rubs, or gallops. Msk:  Strength and tone normal for age. Extremities/Skin: Warm and dry.  Neuro: Alert and oriented X 3. Moves all extremities spontaneously. Gait is normal. CNII-XII grossly in tact. Psych:  Responds to questions appropriately with a normal affect.     ASSESSMENT AND PLAN:  69 y.o. year old female with  1. Cough Will obtain chest x-ray and then follow-up with patient once I get these results.  NOTE::She does  cough several times during the visit and it does sound like a deep cough.  (Not a hacky cough that you would usually see with post nasal drip or GERD)  07/04/14--prescribed a Z-Pak 07/10/14---prescribed Levaquin  - DG Chest 2 View; Future   Signed, Olean Ree [place], [place], [facility] 08/01/2014 11:07 AM

## 2014-08-01 NOTE — Telephone Encounter (Signed)
Pt has appointment at [place] on Fri march 11 at 3pm arrive 2:40pm, pt needs to bring ID and insurance. Left message on both mobile and home numbers to return my call

## 2014-08-02 ENCOUNTER — Telehealth: Payer: Self-pay | Admitting: Family Medicine

## 2014-08-02 NOTE — Telephone Encounter (Signed)
I agree with documentation from [facility]. I recommend that she have CT.

## 2014-08-02 NOTE — Telephone Encounter (Signed)
(  continuation of note from below)  Has started to feel some better and feels maybe will not need CT.  Maybe wait until has finished steroid and see how she feels.  I told her good that she feels better but has had cough for a long time and has abnormal chest xray.  Steroid will probably make her feel better from all the irritation from the cough but need to find out what underlying cause is.  Reinforced abnormal CXR.  Patient understood, does still want your advise.  Does have CT tomorrow and have told her NOT to cancel that appt.

## 2014-08-02 NOTE — Telephone Encounter (Signed)
Per phone note by Maudie Mercury pt aware of appt

## 2014-08-02 NOTE — Telephone Encounter (Signed)
Pt called concerned about having CT tomorrow.  Maria Gomez has started steroids and already is feeling some better.

## 2014-08-02 NOTE — Telephone Encounter (Signed)
Pt called.  Understands need for CT as planned

## 2014-08-03 ENCOUNTER — Ambulatory Visit
Admission: RE | Admit: 2014-08-03 | Discharge: 2014-08-03 | Disposition: A | Discharge status: Home / Self Care | Payer: Medicare Other | Source: Ambulatory Visit | Attending: Physician Assistant | Admitting: Physician Assistant

## 2014-08-03 DIAGNOSIS — R05 Cough: Secondary | ICD-10-CM

## 2014-08-03 DIAGNOSIS — K449 Diaphragmatic hernia without obstruction or gangrene: Secondary | ICD-10-CM | POA: Diagnosis not present

## 2014-08-03 DIAGNOSIS — R053 Chronic cough: Secondary | ICD-10-CM

## 2014-08-03 DIAGNOSIS — R0602 Shortness of breath: Secondary | ICD-10-CM | POA: Diagnosis not present

## 2014-08-03 DIAGNOSIS — R9389 Abnormal findings on diagnostic imaging of other specified body structures: Secondary | ICD-10-CM

## 2014-12-04 ENCOUNTER — Other Ambulatory Visit: Payer: Self-pay | Admitting: Physician Assistant

## 2014-12-04 NOTE — Telephone Encounter (Signed)
Medication refilled per protocol. 

## 2014-12-07 ENCOUNTER — Telehealth: Payer: Self-pay | Admitting: Physician Assistant

## 2014-12-07 NOTE — Telephone Encounter (Signed)
Left message NTBS

## 2014-12-07 NOTE — Telephone Encounter (Signed)
Patient was prescribed prednisone last march and would like to know if this can be prescribed again for her by chance for her ongoing cough [phone]

## 2015-02-02 DIAGNOSIS — L255 Unspecified contact dermatitis due to plants, except food: Secondary | ICD-10-CM | POA: Diagnosis not present

## 2015-02-15 ENCOUNTER — Other Ambulatory Visit: Payer: Self-pay | Admitting: Physician Assistant

## 2015-02-15 NOTE — Telephone Encounter (Signed)
Refill appropriate and filled per protocol. 

## 2015-03-22 DIAGNOSIS — Z23 Encounter for immunization: Secondary | ICD-10-CM | POA: Diagnosis not present

## 2015-04-16 ENCOUNTER — Other Ambulatory Visit: Payer: Self-pay | Admitting: Physician Assistant

## 2015-04-16 NOTE — Telephone Encounter (Signed)
Medication refilled per protocol. 

## 2015-06-17 ENCOUNTER — Other Ambulatory Visit: Payer: Self-pay | Admitting: Physician Assistant

## 2015-06-18 ENCOUNTER — Other Ambulatory Visit: Payer: Self-pay

## 2015-06-18 DIAGNOSIS — Z1231 Encounter for screening mammogram for malignant neoplasm of breast: Secondary | ICD-10-CM

## 2015-07-16 ENCOUNTER — Other Ambulatory Visit: Payer: Medicare Other

## 2015-07-16 ENCOUNTER — Other Ambulatory Visit: Payer: Self-pay | Admitting: Family Medicine

## 2015-07-16 DIAGNOSIS — E559 Vitamin D deficiency, unspecified: Secondary | ICD-10-CM | POA: Diagnosis not present

## 2015-07-16 DIAGNOSIS — Z79899 Other long term (current) drug therapy: Secondary | ICD-10-CM

## 2015-07-16 DIAGNOSIS — Z0000 Encounter for general adult medical examination without abnormal findings: Secondary | ICD-10-CM | POA: Diagnosis not present

## 2015-07-16 DIAGNOSIS — E039 Hypothyroidism, unspecified: Secondary | ICD-10-CM

## 2015-07-16 DIAGNOSIS — M8580 Other specified disorders of bone density and structure, unspecified site: Secondary | ICD-10-CM

## 2015-07-16 LAB — CBC WITH DIFFERENTIAL/PLATELET
Basophils Absolute: 0 10*3/uL (ref 0.0–0.1)
Basophils Relative: 0 % (ref 0–1)
Eosinophils Absolute: 0.1 10*3/uL (ref 0.0–0.7)
Eosinophils Relative: 2 % (ref 0–5)
HEMATOCRIT: 43.2 % (ref 36.0–46.0)
HEMOGLOBIN: 14.3 g/dL (ref 12.0–15.0)
LYMPHS PCT: 35 % (ref 12–46)
Lymphs Abs: 2.1 10*3/uL (ref 0.7–4.0)
MCH: 30.8 pg (ref 26.0–34.0)
MCHC: 33.1 g/dL (ref 30.0–36.0)
MCV: 92.9 fL (ref 78.0–100.0)
MONO ABS: 0.4 10*3/uL (ref 0.1–1.0)
MONOS PCT: 7 % (ref 3–12)
MPV: 10.1 fL (ref 8.6–12.4)
NEUTROS ABS: 3.3 10*3/uL (ref 1.7–7.7)
Neutrophils Relative %: 56 % (ref 43–77)
Platelets: 256 10*3/uL (ref 150–400)
RBC: 4.65 MIL/uL (ref 3.87–5.11)
RDW: 14 % (ref 11.5–15.5)
WBC: 5.9 10*3/uL (ref 4.0–10.5)

## 2015-07-17 LAB — LIPID PANEL
Cholesterol: 231 mg/dL — ABNORMAL HIGH (ref 125–200)
HDL: 51 mg/dL (ref >=46)
LDL CALC: 149 mg/dL — AB (ref <130)
TRIGLYCERIDES: 154 mg/dL — AB (ref <150)
Total CHOL/HDL Ratio: 4.5 Ratio (ref <=5.0)
VLDL: 31 mg/dL — ABNORMAL HIGH (ref <30)

## 2015-07-17 LAB — COMPLETE METABOLIC PANEL WITH GFR
ALBUMIN: 4.1 g/dL (ref 3.6–5.1)
ALK PHOS: 75 U/L (ref 33–130)
ALT: 10 U/L (ref 6–29)
AST: 13 U/L (ref 10–35)
BUN: 18 mg/dL (ref 7–25)
CALCIUM: 9.4 mg/dL (ref 8.6–10.4)
CHLORIDE: 104 mmol/L (ref 98–110)
CO2: 26 mmol/L (ref 20–31)
Creat: 1 mg/dL — ABNORMAL HIGH (ref 0.50–0.99)
GFR, EST AFRICAN AMERICAN: 66 mL/min (ref >=60)
GFR, EST NON AFRICAN AMERICAN: 58 mL/min — AB (ref >=60)
Glucose, Bld: 94 mg/dL (ref 70–99)
POTASSIUM: 4.6 mmol/L (ref 3.5–5.3)
Sodium: 139 mmol/L (ref 135–146)
Total Bilirubin: 0.4 mg/dL (ref 0.2–1.2)
Total Protein: 6.8 g/dL (ref 6.1–8.1)

## 2015-07-17 LAB — TSH: TSH: 2.66 mIU/L

## 2015-07-17 LAB — VITAMIN D 25 HYDROXY (VIT D DEFICIENCY, FRACTURES): VIT D 25 HYDROXY: 45 ng/mL (ref 30–100)

## 2015-07-22 ENCOUNTER — Ambulatory Visit (INDEPENDENT_AMBULATORY_CARE_PROVIDER_SITE_OTHER): Payer: Medicare Other | Admitting: Physician Assistant

## 2015-07-22 ENCOUNTER — Encounter: Payer: Self-pay | Admitting: Physician Assistant

## 2015-07-22 VITALS — BP 110/68 | HR 68 | Temp 98.3°F | Resp 18 | Ht 61.0 in | Wt 156.0 lb

## 2015-07-22 DIAGNOSIS — M8580 Other specified disorders of bone density and structure, unspecified site: Secondary | ICD-10-CM

## 2015-07-22 DIAGNOSIS — E039 Hypothyroidism, unspecified: Secondary | ICD-10-CM

## 2015-07-22 DIAGNOSIS — Z0000 Encounter for general adult medical examination without abnormal findings: Secondary | ICD-10-CM | POA: Diagnosis not present

## 2015-07-22 MED ORDER — LEVOTHYROXINE SODIUM 25 MCG PO TABS: ORAL_TABLET | ORAL | Status: DC

## 2015-07-22 NOTE — Progress Notes (Addendum)
Patient ID: Maria Gomez MRN: [id number], DOB: 1945-07-23, 70 y.o. Date of Encounter: 07/22/2015,   Chief Complaint: Physical (CPE)  HPI: 70 y.o. y/o female here for CPE.   She has no complaints today.  She shows me a picture on her cell phone-- there is a picture of her with a pile of limbs and brush that she has been working on. Says she "works hard." Pt states she has moved into her mother's house to live with her mom. Patient's son has moved into patient's old house. As well, patient's aunt lives next door. 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 Says that the brush pile that is in her picture today is where she takes care of his property.  In addition to this "yardwork" she walks 1 mile a day. States that she has recently started doing [facility] [employer] [facility]---- says she really enjoys that.  She is taking her thyroid medication daily.  Review of Systems: Consitutional: No fever, chills, fatigue, night sweats, lymphadenopathy. No significant/unexplained weight changes. Eyes: No visual changes, eye redness, or discharge. ENT/Mouth: No ear pain, sore throat, nasal drainage, or sinus pain. Cardiovascular: No chest pressure,heaviness, tightness or squeezing, even with exertion. No increased shortness of breath or dyspnea on exertion.No palpitations, edema, orthopnea, PND. Respiratory: No cough, hemoptysis, SOB, or wheezing. Gastrointestinal: No anorexia, dysphagia, reflux, pain, nausea, vomiting, hematemesis, diarrhea, constipation, BRBPR, or melena. Breast: No mass, nodules, bulging, or retraction. No skin changes or inflammation. No nipple discharge. No lymphadenopathy. Genitourinary: No dysuria, hematuria, incontinence, vaginal discharge, pruritis, burning, abnormal bleeding, or pain. Musculoskeletal: No decreased ROM, No joint pain or swelling. No significant pain in neck, back, or extremities. Skin: No rash,  pruritis, or concerning lesions. Neurological: No headache, dizziness, syncope, seizures, tremors, memory loss, coordination problems, or paresthesias. Psychological: No anxiety, depression, hallucinations, SI/HI. Endocrine: No polydipsia, polyphagia, polyuria, or known diabetes.No increased fatigue. No palpitations/rapid heart rate. No significant/unexplained weight change. All other systems were reviewed and are otherwise negative.  Past Medical History  Diagnosis Date  . Hypothyroidism   . Osteopenia      History reviewed. No pertinent past surgical history.  Home Meds:  Current Outpatient Prescriptions on File Prior to Visit  Medication Sig Dispense Refill  . Biotin 1 MG CAPS Take 1 capsule by mouth daily.    . Calcium Carbonate-Vitamin D (CALCIUM + D PO) Take 500 mg by mouth daily.    . Coenzyme Q10 (COQ10) 200 MG CAPS Take 1 capsule by mouth daily.    . Cyanocobalamin ([facility] B-12) 1500 MCG TBDP Take 1 tablet by mouth daily.    Marland Kitchen levothyroxine (SYNTHROID, LEVOTHROID) 25 MCG tablet TAKE 1 TABLET EVERY DAY BEFORE BREAKFAST (NEED MD APPOINTMENT) 90 tablet 0  . Multiple Vitamin (MULTIVITAMIN) tablet Take 1 tablet by mouth daily. Vitality    . Probiotic Product (PROBIOTIC DAILY PO) Take 1 capsule by mouth daily. Florify    . OMEGA-3 KRILL OIL PO Take 1 capsule by mouth daily. Reported on 07/22/2015     No current facility-administered medications on file prior to visit.    Allergies:  Allergies  Allergen Reactions  . Niaspan [Niacin Er] Other (See Comments)    Severe flushing    Social History   Social History  . Marital Status: Married    Spouse Name: N/A  . Number of Children: N/A  . Years of Education: N/A  Occupational History  . Not on file.   Social History Main Topics  . Smoking status: Never Smoker   . Smokeless tobacco: Never Used  . Alcohol Use: No  . Drug Use: No  . Sexual Activity: Not on file   Other Topics Concern  . Not on file   Social History  Narrative   Lives with her mom.    Takes care of aunt who lives across the street.    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          Family History  Problem Relation Age of Onset  . Cancer Brother 40    ColoRectal Cancer    Physical Exam: Blood pressure 110/68, pulse 68, temperature 98.3 F (36.8 C), temperature source Oral, resp. rate 18, height _0  (1.549 m), weight 156 lb (70.761 kg)., Body mass index is 29.49 kg/(m^2). General: Well developed, well nourished, WF. Appears  in no acute distress. HEENT: Normocephalic, atraumatic. Conjunctiva pink, sclera non-icteric. Pupils 2 mm constricting to 1 mm, round, regular, and equally reactive to light and accomodation. EOMI. Internal auditory canal clear. TMs with good cone of light and without pathology. Nasal mucosa pink. Nares are without discharge. No sinus tenderness. Oral mucosa pink.  Pharynx without exudate.   Neck: Supple. Trachea midline. No thyromegaly. Full ROM. No lymphadenopathy.No Carotid Bruits. Lungs: Clear to auscultation bilaterally without wheezes, rales, or rhonchi. Breathing is of normal effort and unlabored. Cardiovascular: RRR with S1 S2. No murmurs, rubs, or gallops. Distal pulses 2+ symmetrically. No carotid or abdominal bruits. Breast: Symmetrical. No masses. Nipples without discharge. Abdomen: Soft, non-tender, non-distended with normoactive bowel sounds. No hepatosplenomegaly or masses. No rebound/guarding. No CVA tenderness. No hernias.  Genitourinary: External genitalia normal. Vaginal mucosa normal. Cervix normal. Bimanual exam normal with no masses and no pain. Musculoskeletal: Full range of motion and 5/5 strength throughout.   Skin: Warm and moist. Left forehead: there is one area that is scaly appearance, rough with palpation. There is small amount of pink erythema underlying the scale. Lesion approx 3 mm diameter, raised approx 2 mm (scale).  Neuro: A+Ox3. CN II-XII grossly  intact. Moves all extremities spontaneously. Full sensation throughout. Normal gait. Psych:  Responds to questions appropriately with a normal affect.   Assessment/Plan:  70 y.o. y/o female here for CPE 1. Visit for preventive health examination   A. Screening Labs:  Results for orders placed or performed in visit on 07/16/15  COMPLETE METABOLIC PANEL WITH GFR  Result Value Ref Range   Sodium 139 135 - 146 mmol/L   Potassium 4.6 3.5 - 5.3 mmol/L   Chloride 104 98 - 110 mmol/L   CO2 26 20 - 31 mmol/L   Glucose, Bld 94 70 - 99 mg/dL   BUN 18 7 - 25 mg/dL   Creat 1.00 (H) 0.50 - 0.99 mg/dL   Total Bilirubin 0.4 0.2 - 1.2 mg/dL   Alkaline Phosphatase 75 33 - 130 U/L   AST 13 10 - 35 U/L   ALT 10 6 - 29 U/L   Total Protein 6.8 6.1 - 8.1 g/dL   Albumin 4.1 3.6 - 5.1 g/dL   Calcium 9.4 8.6 - 10.4 mg/dL   GFR, Est African American 66 >=60 mL/min   GFR, Est Non African American 58 (L) >=60 mL/min  TSH  Result Value Ref Range   TSH 2.66 mIU/L  Lipid panel  Result Value Ref Range   Cholesterol 231 (H)  125 - 200 mg/dL   Triglycerides 154 (H) <150 mg/dL   HDL 51 >=46 mg/dL   Total CHOL/HDL Ratio 4.5 <=5.0 Ratio   VLDL 31 (H) <30 mg/dL   LDL Cholesterol 149 (H) <130 mg/dL  CBC with Differential/Platelet  Result Value Ref Range   WBC 5.9 4.0 - 10.5 K/uL   RBC 4.65 3.87 - 5.11 MIL/uL   Hemoglobin 14.3 12.0 - 15.0 g/dL   HCT 43.2 36.0 - 46.0 %   MCV 92.9 78.0 - 100.0 fL   MCH 30.8 26.0 - 34.0 pg   MCHC 33.1 30.0 - 36.0 g/dL   RDW 14.0 11.5 - 15.5 %   Platelets 256 150 - 400 K/uL   MPV 10.1 8.6 - 12.4 fL   Neutrophils Relative % 56 43 - 77 %   Neutro Abs 3.3 1.7 - 7.7 K/uL   Lymphocytes Relative 35 12 - 46 %   Lymphs Abs 2.1 0.7 - 4.0 K/uL   Monocytes Relative 7 3 - 12 %   Monocytes Absolute 0.4 0.1 - 1.0 K/uL   Eosinophils Relative 2 0 - 5 %   Eosinophils Absolute 0.1 0.0 - 0.7 K/uL   Basophils Relative 0 0 - 1 %   Basophils Absolute 0.0 0.0 - 0.1 K/uL   Smear Review  Criteria for review not met   VITAMIN D 25 Hydroxy (Vit-D Deficiency, Fractures)  Result Value Ref Range   Vit D, 25-Hydroxy 45 30 - 100 ng/mL    Cholesterol numbers are the same as they were at the last check. She is kind of borderline of considering statin therapy. Discussed this with her. Will continue to try to decrease saturated fats in the diet and continue her walking. At [place] 06/2014 she was a little frustrated because she says that every night her mom eats ice cream and her mom frequently eats cake and patient eats neither of these. However at that visit we did discuss the fact that she does eat a lot of cheese and sour cream and casseroles. Discussed trying to decrease these foods. She does not want to start statin therapy at this time. At [place] 06/2015---Discussed that cholesterol numbers all about the same as at last check. She still does not want medication.   B. Pap: Last Pap smear was performed here 02/27/2011. Cytology was normal. There was no HPV cotesting.  Latest guidelines state that no further pap indicated after age 42, as last pap nml and no h/o abnormality.  She had pelvic exam here 06/2013 which was normal.  Pelvic Exam--07/22/15--Normal.   C. Screening Mammogram: She just recently had mammogram 06/27/2014--negative. Repeat one year. At [place] 07/22/15 she states that she has mammogram scheduled for tomorrow.  D. DEXA/BMD:  At recent physical she had never had a DEXA/BMD. Placed referral for this and she had this performed 06/02/2012.  Showed osteopenia in both sites.  Lumbar spine: T score: -1.7 Left hip: T score: -2.1 See # 3 below.--" "3 . Osteopenia She does report that her mother has osteoporosis. Discussed with her her mother has had any fractures. She says her mother has only had an ankle fracture. Says that this occurred after she fell on a wet floor and experienced a hard fall. Patient does not smoke. She has been on no chronic steroid treatment. She is now doing  weight-bearing exercise and walking 1 mile daily. She is on calcium and vitamin D. We can wait and monitor this and hold off on starting any prescription medications  right now. At [place] 07/22/2015----Ordered F/U DEXA-- ADDENDUM--DEXA performed 07/30/2015-- T-Score -2.0. No statistically significant change in BMD of Lumbar Spine or Left Hip since 06/02/2012. Pt informed to cont Calcium, Vit D, Weight Bearing Exercise.     E. Colorectal Cancer Screening: She had colonoscopy  performed by Dr. Penelope Coop 09/25/2004. She had another colonoscopy performed 01/11/2014. Found this listed in [place] but was unable to find the actual report and results to tell when she is supposed to have repeat.  F. Immunizations:  Influenza: She reports that she has already gotten the influenza vaccine for this season. Tetanus: Up-to-date. Given 04/08/2012 Pneumococcal: Pneumovax was given 02/2011                             Prevnar 13 --given here 06/26/2013  Zostavax:  She has received this 05/20/2012    2. Hypothyroidism Recent TSH is within normal limits. Continue current dose.  3. Osteopenia She does report that her mother has osteoporosis. Discussed with her her mother has had any fractures. He says her mother has only had an ankle fracture. Says that this occurred after she fell on a wet floor and experienced a hard fall. Patient does not smoke. She has been on no chronic steroid treatment. She is now doing weight-bearing exercise and walking 1 mile daily. She is on calcium and vitamin D. We can wait and monitor this and hold off on starting any prescription medications right now. At [place] 07/22/2015----Ordered F/U DEXA--  4. Actinic Keratosis A.K on Left Forehead--- Cryo therapy applied 4 freeze- thaw cycles.  Subjective:   Patient presents for Medicare Annual/Subsequent preventive examination.   Review Past Medical/Family/Social: These are all documented in Epic and are all reviewed today.  Risk Factors    Current exercise habits:  Walking, Water Aerobics, "Yard Work" for 15 acres!!! Dietary issues discussed: Discussed decreasing cheese sour cream and casseroles. See note under lipids above.  Cardiac risk factors: Age, Lipids   Depression Screen  (Note: if answer to either of the following is "Yes", a more complete depression screening is indicated)  Over the past two weeks, have you felt down, depressed or hopeless? No Over the past two weeks, have you felt little interest or pleasure in doing things? No Have you lost interest or pleasure in daily life? No Do you often feel hopeless? No Do you cry easily over simple problems? No   Activities of Daily Living  In your present state of health, do you have any difficulty performing the following activities?:  Driving? No  Managing money? No  Feeding yourself? No  Getting from bed to chair? No  Climbing a flight of stairs? No  Preparing food and eating?: No  Bathing or showering? No  Getting dressed: No  Getting to the toilet? No  Using the toilet:No  Moving around from place to place: No  In the past year have you fallen or had a near fall?:No  Are you sexually active? No  Do you have more than one partner? No   Hearing Difficulties: No  Do you often ask people to speak up or repeat themselves? No  Do you experience ringing or noises in your ears? No Do you have difficulty understanding soft or whispered voices? No  Do you feel that you have a problem with memory? No Do you often misplace items? No  Do you feel safe at home? Yes  Cognitive Testing  Alert? Yes Normal Appearance?Yes  Oriented to person? Yes Place? Yes  Time? Yes  Recall of three objects? Yes  Can perform simple calculations? Yes  Displays appropriate judgment?Yes  Can read the correct time from a watch face?Yes   List the Names of Other Physician/Practitioners you currently use:  GI--In EPic  Indicate any recent Medical Services you may have received from  other than [facility] providers in the past year (date may be approximate).   Screening Tests / Date Colonoscopy --all of this information is listed above.                    Zostavax  Mammogram  Influenza Vaccine  Tetanus/tdap    Assessment:    Annual wellness medicare exam   Plan:    During the course of the visit the patient was educated and counseled about appropriate screening and preventive services including:  Screening mammography  Colorectal cancer screening  Shingles vaccine. Prescription given to that she can get the vaccine at the pharmacy or Medicare part D.  Screen + for depression. PHQ- 9 score of 12 (moderate depression). We discussed the options of counseling versus possibly a medication. I encouraged her strongly think about the counseling. She is going through some medical problems currently and her husband is as well Mrs. been very stressful for her. She says she will think about it. She does have Xanax to use as needed. Though she may benefit from an SSRI for her more depressive type symptoms but she wants to hold off at this time.  I aksed her to please have her cardioloist send records since we have none on file.  Diet review for nutrition referral? Yes ____ Not Indicated __x__  Patient Instructions (the written plan) was given to the patient.  Medicare Attestation  I have personally reviewed:  The patient's medical and social history  Their use of alcohol, tobacco or illicit drugs  Their current medications and supplements  The patient's functional ability including ADLs,fall risks, home safety risks, cognitive, and hearing and visual impairment  Diet and physical activities  Evidence for depression or mood disorders  The patient's weight, height, BMI, and visual acuity have been recorded in the chart. I have made referrals, counseling, and provided education to the patient based on review of the above and I have provided the patient with a written personalized care  plan for preventive services.        Signed, [address] [place], [place], [facility] 07/22/2015 10:14 AM

## 2015-07-23 ENCOUNTER — Ambulatory Visit
Admission: RE | Admit: 2015-07-23 | Discharge: 2015-07-23 | Disposition: A | Discharge status: Home / Self Care | Payer: Medicare Other | Source: Ambulatory Visit

## 2015-07-23 DIAGNOSIS — Z1231 Encounter for screening mammogram for malignant neoplasm of breast: Secondary | ICD-10-CM

## 2015-07-30 ENCOUNTER — Ambulatory Visit
Admission: RE | Admit: 2015-07-30 | Discharge: 2015-07-30 | Disposition: A | Discharge status: Home / Self Care | Payer: Medicare Other | Source: Ambulatory Visit | Attending: Physician Assistant | Admitting: Physician Assistant

## 2015-07-30 DIAGNOSIS — M8589 Other specified disorders of bone density and structure, multiple sites: Secondary | ICD-10-CM | POA: Diagnosis not present

## 2015-07-30 DIAGNOSIS — M8580 Other specified disorders of bone density and structure, unspecified site: Secondary | ICD-10-CM

## 2015-07-30 LAB — HM DEXA SCAN

## 2015-07-31 ENCOUNTER — Encounter: Payer: Self-pay | Admitting: Family Medicine

## 2016-01-06 ENCOUNTER — Ambulatory Visit (INDEPENDENT_AMBULATORY_CARE_PROVIDER_SITE_OTHER): Payer: Medicare Other | Admitting: Physician Assistant

## 2016-01-06 ENCOUNTER — Encounter: Payer: Self-pay | Admitting: Physician Assistant

## 2016-01-06 DIAGNOSIS — K5900 Constipation, unspecified: Secondary | ICD-10-CM

## 2016-01-06 MED ORDER — LINACLOTIDE 72 MCG PO CAPS: 72.0000 ug | ORAL_CAPSULE | Freq: Every day | ORAL | 6 refills | Status: DC

## 2016-01-06 NOTE — Progress Notes (Signed)
Patient ID: Maria Gomez MRN: [id number], DOB: 1945-07-02, 70 y.o. Date of Encounter: 01/06/2016, 3:15 PM    Chief Complaint:  Chief Complaint  Patient presents with  . Constipation    Pt states she has to use Milk of Mag and prunes to use the bathroom - states she either has constipation or diarrhea intermitting  . Abdominal Pain    Intermitting lower abdomen pain   . Rash    on chest/upper abdomen      HPI: 70 y.o. year old female presents with above.   I asked about her rash--- she says this is resolved. 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  Regarding the constipation----says that she has been having this issue for about 1-1/2 months. Says that once or twice a week she uses milk of magnesia and prunes together and that works. Says that she has tried using just milk of magnesia by itself and it does not work and she has tried prunes by itself and that doesn't work but the combination works.  She does drink plenty of water on a regular basis.       Home Meds:   Outpatient Medications Prior to Visit  Medication Sig Dispense Refill  . Biotin 1 MG CAPS Take 1 capsule by mouth daily.    . Calcium Carbonate-Vitamin D (CALCIUM + D PO) Take 500 mg by mouth daily.    . Coenzyme Q10 (COQ10) 200 MG CAPS Take 1 capsule by mouth daily.    . Cyanocobalamin ([facility] B-12) 1500 MCG TBDP Take 1 tablet by mouth daily.    Marland Kitchen levothyroxine (SYNTHROID, LEVOTHROID) 25 MCG tablet TAKE 1 TABLET EVERY DAY BEFORE BREAKFAST 90 tablet 2  . Multiple Vitamin (MULTIVITAMIN) tablet Take 1 tablet by mouth daily. Vitality    . OMEGA-3 KRILL OIL PO Take 1 capsule by mouth daily. Reported on 07/22/2015    . Probiotic Product (PROBIOTIC DAILY PO) Take 1 capsule by mouth daily. Florify    . Melatonin 5 MG TABS Take 10 mg by mouth at bedtime as needed.     No facility-administered medications prior to visit.     Allergies:  Allergies    Allergen Reactions  . Niaspan [Niacin Er] Other (See Comments)    Severe flushing      Review of Systems: See HPI for pertinent ROS. All other ROS negative.    Physical Exam: Blood pressure 124/82, pulse 78, temperature 98.6 F (37 C), temperature source Oral, resp. rate 16, height 5\' 1"  (1.549 m), weight 152 lb (68.9 kg), SpO2 98 %., Body mass index is 28.72 kg/m. General:  WNWD WF. Appears in no acute distress. Neck: Supple. No thyromegaly. No lymphadenopathy. Lungs: Clear bilaterally to auscultation without wheezes, rales, or rhonchi. Breathing is unlabored. Heart: Regular rhythm. No murmurs, rubs, or gallops. Abdomen: Soft, non-tender, non-distended with normoactive bowel sounds. No hepatomegaly. No rebound/guarding. No obvious abdominal masses. Msk:  Strength and tone normal for age. Extremities/Skin: Inspected chest and abdomen but there is no rash at this time. Neuro: Alert and oriented X 3. Moves all extremities spontaneously. Gait is normal. CNII-XII grossly in tact. Psych:  Responds to questions appropriately with a normal affect.     ASSESSMENT AND PLAN:  70 y.o. year old female with  1. Constipation, unspecified constipation type Gave her 2 bottles of samples --- each containing 4 capsules ---for a total of 8 capsules. She is to  try the samples first.  She is to take her thyroid medication and then 30 minutes later take a Linzess then she can do her usual routine as far as eating drinking etc.  She can try taking the Linzess daily but then adjust the dosing as needed based on her stools.  If she feels that she needs a higher dose she is to call me and I can increase the dose. However based on her current symptoms and current treatment, I think that this low dose would be adequate and she may not even need this dose on a daily basis. Follow-up PRN. - linaclotide (LINZESS) 72 MCG capsule; Take 1 capsule (72 mcg total) by mouth daily before breakfast.  Dispense: 30 capsule;  Refill: [address] [place], [place], [facility] 01/06/2016 3:15 PM

## 2016-01-13 ENCOUNTER — Telehealth: Payer: Self-pay | Admitting: Family Medicine

## 2016-01-13 MED ORDER — LINACLOTIDE 145 MCG PO CAPS: 145.0000 ug | ORAL_CAPSULE | Freq: Every day | ORAL | 0 refills | Status: DC

## 2016-01-13 NOTE — Telephone Encounter (Signed)
Pt calling about the Rx for Linzess 72 mcg.  Says it really is not working.  Still needs to take MOM to get results.  Has not filled Rx given.  Please advise.

## 2016-01-13 NOTE — Telephone Encounter (Signed)
Tell her will try higher / "regular dose" -- I had given her the low dose.  Linzess 138mcg 1 po Q AM # 30 + 5

## 2016-01-13 NOTE — Telephone Encounter (Signed)
Called and discussed dose change with patient.  Will call in 1 30 day supply to local pharmacy.  Pt will call at end of week 2 into 3 and report on how it is working.  If working will send 90 day refills to mail order.

## 2016-01-14 ENCOUNTER — Telehealth: Payer: Self-pay | Admitting: Family Medicine

## 2016-01-14 NOTE — Telephone Encounter (Signed)
Linzess is $352.42.  TOO expensive.  What else can we do??

## 2016-01-15 NOTE — Telephone Encounter (Signed)
Amitiza 24 mcg 1 po BID with food # 60 + 5

## 2016-01-16 MED ORDER — LUBIPROSTONE 24 MCG PO CAPS: 24.0000 ug | ORAL_CAPSULE | Freq: Two times a day (BID) | ORAL | 0 refills | Status: DC

## 2016-01-16 NOTE — Telephone Encounter (Signed)
New Rx to pharmacy and left message for pt to call me back

## 2016-01-17 NOTE — Telephone Encounter (Signed)
Pt aware of new Rx.  Let us know if any issues with cost or effectiveness

## 2016-02-19 ENCOUNTER — Other Ambulatory Visit: Payer: Self-pay | Admitting: Physician Assistant

## 2016-03-03 ENCOUNTER — Telehealth: Payer: Self-pay

## 2016-03-03 NOTE — Telephone Encounter (Signed)
Pt states lubiprostone is not working for her all that good and wanted to see what you suggested she do now?  Pt states she still uses her prune mixture that works

## 2016-03-04 NOTE — Telephone Encounter (Signed)
Try: Linzess 135mcg 1 po QAM --- # 30 +0. If this works, then will refill as # 90 supply---take this daily. Call us in 2 - 3 weeks and let us know--if this works, then will send refill for # 90

## 2016-03-05 MED ORDER — LINACLOTIDE 145 MCG PO CAPS: 145.0000 ug | ORAL_CAPSULE | Freq: Every day | ORAL | 0 refills | Status: DC

## 2016-03-05 NOTE — Telephone Encounter (Signed)
RX filled.

## 2016-03-09 ENCOUNTER — Telehealth: Payer: Self-pay

## 2016-03-09 DIAGNOSIS — Z23 Encounter for immunization: Secondary | ICD-10-CM | POA: Diagnosis not present

## 2016-03-09 NOTE — Telephone Encounter (Signed)
Pt was taking amitiza which did not help with her symptoms and the Linzess cost is not affordable to her.  Pt states she will cke with ins and see what they will cover

## 2016-04-19 DIAGNOSIS — N390 Urinary tract infection, site not specified: Secondary | ICD-10-CM | POA: Diagnosis not present

## 2016-04-29 ENCOUNTER — Ambulatory Visit (INDEPENDENT_AMBULATORY_CARE_PROVIDER_SITE_OTHER): Payer: Medicare Other | Admitting: Physician Assistant

## 2016-04-29 ENCOUNTER — Encounter: Payer: Self-pay | Admitting: Physician Assistant

## 2016-04-29 VITALS — BP 118/62 | HR 70 | Temp 98.2°F | Resp 16 | Wt 145.0 lb

## 2016-04-29 DIAGNOSIS — N390 Urinary tract infection, site not specified: Secondary | ICD-10-CM | POA: Diagnosis not present

## 2016-04-29 DIAGNOSIS — R8290 Unspecified abnormal findings in urine: Secondary | ICD-10-CM

## 2016-04-29 LAB — URINALYSIS, ROUTINE W REFLEX MICROSCOPIC
BILIRUBIN URINE: NEGATIVE
Glucose, UA: NEGATIVE
KETONES UR: NEGATIVE
Nitrite: NEGATIVE
PH: 7 (ref 5.0–8.0)
Protein, ur: NEGATIVE
SPECIFIC GRAVITY, URINE: 1.015 (ref 1.001–1.035)

## 2016-04-29 LAB — URINALYSIS, MICROSCOPIC ONLY
CASTS: NONE SEEN [LPF]
CRYSTALS: NONE SEEN [HPF]
Yeast: NONE SEEN [HPF]

## 2016-04-29 MED ORDER — CIPROFLOXACIN HCL 500 MG PO TABS
500.0000 mg | ORAL_TABLET | Freq: Two times a day (BID) | ORAL | 0 refills | Status: DC
Start: 2016-04-29 — End: 2016-08-17

## 2016-04-29 NOTE — Progress Notes (Signed)
Patient ID: Maria Gomez MRN: [id number], DOB: 05-16-1946, 70 y.o. Date of Encounter: 04/29/2016, 12:34 PM    Chief Complaint:  Chief Complaint  Patient presents with  . urine cloudy     HPI: 70 y.o. year old female presents with above.   Says October 24 she saw her dentist for an abscess and root canal. At that time he prescribed amoxicillin 500 mg #22 pills.  Says then at Thanksgiving she developed UTI. We were closed so [facility] went to [facility] They prescribed nitrofurantoin 100 mg 1 twice a day 7 days #14+0. She says that she took all of that. Says that 24 hours later after she had completed the antibiotics her urine got cloudy again and she developed urinary frequency again.  Came here for follow-up today because urine now cloudy and having urinary frequency again. Has not noticed much dysuria. Has had no fevers or chills. No back pain.       Home Meds:   Outpatient Medications Prior to Visit  Medication Sig Dispense Refill  . Biotin 1 MG CAPS Take 1 capsule by mouth daily.    . Calcium Carbonate-Vitamin D (CALCIUM + D PO) Take 500 mg by mouth daily.    . Coenzyme Q10 (COQ10) 200 MG CAPS Take 1 capsule by mouth daily.    . Cyanocobalamin ([facility] B-12) 1500 MCG TBDP Take 1 tablet by mouth daily.    Marland Kitchen levothyroxine (SYNTHROID, LEVOTHROID) 25 MCG tablet TAKE 1 TABLET EVERY DAY BEFORE BREAKFAST 90 tablet 2  . Multiple Vitamin (MULTIVITAMIN) tablet Take 1 tablet by mouth daily. Vitality    . OMEGA-3 KRILL OIL PO Take 1 capsule by mouth daily. Reported on 07/22/2015    . Probiotic Product (PROBIOTIC DAILY PO) Take 1 capsule by mouth daily. Florify    . diphenhydrAMINE (BENADRYL) 50 MG tablet Take 50 mg by mouth at bedtime as needed for itching.    . linaclotide (LINZESS) 145 MCG CAPS capsule Take 1 capsule (145 mcg total) by mouth daily before breakfast. (Patient not taking: Reported on 04/29/2016) 30 capsule 0  . lubiprostone (AMITIZA) 24 MCG capsule Take 1 capsule (24 mcg  total) by mouth 2 (two) times daily with a meal. (Patient not taking: Reported on 04/29/2016) 60 capsule 0   No facility-administered medications prior to visit.     Allergies:  Allergies  Allergen Reactions  . Niaspan [Niacin Er] Other (See Comments)    Severe flushing  . Phenazopyridine Hcl Other (See Comments)    Made sick      Review of Systems: See HPI for pertinent ROS. All other ROS negative.    Physical Exam: Blood pressure 118/62, pulse 70, temperature 98.2 F (36.8 C), temperature source Oral, resp. rate 16, weight 145 lb (65.8 kg), SpO2 98 %., Body mass index is 27.4 kg/m. General:  WNWD WF Appears in no acute distress. Neck: Supple. No thyromegaly. No lymphadenopathy. Lungs: Clear bilaterally to auscultation without wheezes, rales, or rhonchi. Breathing is unlabored. Heart: Regular rhythm. No murmurs, rubs, or gallops. Abdomen: Soft, non-tender, non-distended with normoactive bowel sounds. No hepatomegaly. No rebound/guarding. No obvious abdominal masses. Msk:  Strength and tone normal for age. No costophrenic angle tenderness with percussion bilaterally. Extremities/Skin: Warm and dry.  Neuro: Alert and oriented X 3. Moves all extremities spontaneously. Gait is normal. CNII-XII grossly in tact. Psych:  Responds to questions appropriately with a normal affect.   Results for orders placed or performed in visit on 04/29/16  Urinalysis, Routine w  reflex microscopic  Result Value Ref Range   Color, Urine YELLOW YELLOW   APPearance CLOUDY (A) CLEAR   Specific Gravity, Urine 1.015 1.001 - 1.035   pH 7.0 5.0 - 8.0   Glucose, UA NEGATIVE NEGATIVE   Bilirubin Urine NEGATIVE NEGATIVE   Ketones, ur NEGATIVE NEGATIVE   Hgb urine dipstick 2+ (A) NEGATIVE   Protein, ur NEGATIVE NEGATIVE   Nitrite NEGATIVE NEGATIVE   Leukocytes, UA 2+ (A) NEGATIVE  Urine Microscopic  Result Value Ref Range   WBC, UA 40-60 (A) <=5 WBC/HPF   RBC / HPF 3-10 (A) <=2 RBC/HPF   Squamous  Epithelial / LPF 0-5 <=5 HPF   Bacteria, UA FEW (A) NONE SEEN HPF   Crystals NONE SEEN NONE SEEN HPF   Casts NONE SEEN NONE SEEN LPF   Yeast NONE SEEN NONE SEEN HPF     ASSESSMENT AND PLAN:  70 y.o. year old female with  1. Urinary tract infection without hematuria, site unspecified She is to start the Cipro immediately take as directed and complete all of it. As well will send urine culture and will follow up with her when I get that result. - ciprofloxacin (CIPRO) 500 MG tablet; Take 1 tablet (500 mg total) by mouth 2 (two) times daily.  Dispense: 14 tablet; Refill: 0 - Urine culture  2. Cloudy urine - Urinalysis, Routine w reflex microscopic   Signed, [address] [place], [place], [facility] 04/29/2016 12:34 PM

## 2016-05-02 LAB — URINE CULTURE

## 2016-06-24 ENCOUNTER — Other Ambulatory Visit: Payer: Self-pay | Admitting: Physician Assistant

## 2016-06-24 DIAGNOSIS — Z1231 Encounter for screening mammogram for malignant neoplasm of breast: Secondary | ICD-10-CM

## 2016-08-04 LAB — HM MAMMOGRAPHY

## 2016-08-05 ENCOUNTER — Ambulatory Visit
Admission: RE | Admit: 2016-08-05 | Discharge: 2016-08-05 | Disposition: A | Discharge status: Home / Self Care | Payer: Medicare Other | Source: Ambulatory Visit | Attending: Physician Assistant | Admitting: Physician Assistant

## 2016-08-05 DIAGNOSIS — Z1231 Encounter for screening mammogram for malignant neoplasm of breast: Secondary | ICD-10-CM | POA: Diagnosis not present

## 2016-08-11 ENCOUNTER — Other Ambulatory Visit: Payer: Self-pay | Admitting: Family Medicine

## 2016-08-11 ENCOUNTER — Other Ambulatory Visit: Payer: Medicare Other

## 2016-08-11 DIAGNOSIS — M8580 Other specified disorders of bone density and structure, unspecified site: Secondary | ICD-10-CM

## 2016-08-11 DIAGNOSIS — E039 Hypothyroidism, unspecified: Secondary | ICD-10-CM

## 2016-08-11 DIAGNOSIS — Z0000 Encounter for general adult medical examination without abnormal findings: Secondary | ICD-10-CM

## 2016-08-11 DIAGNOSIS — Z79899 Other long term (current) drug therapy: Secondary | ICD-10-CM

## 2016-08-11 LAB — COMPLETE METABOLIC PANEL WITH GFR
ALBUMIN: 4.2 g/dL (ref 3.6–5.1)
ALT: 14 U/L (ref 6–29)
AST: 14 U/L (ref 10–35)
Alkaline Phosphatase: 63 U/L (ref 33–130)
BUN: 16 mg/dL (ref 7–25)
CALCIUM: 9.5 mg/dL (ref 8.6–10.4)
CHLORIDE: 105 mmol/L (ref 98–110)
CO2: 27 mmol/L (ref 20–31)
Creat: 1.13 mg/dL — ABNORMAL HIGH (ref 0.60–0.93)
GFR, EST AFRICAN AMERICAN: 57 mL/min — AB (ref >=60)
GFR, EST NON AFRICAN AMERICAN: 49 mL/min — AB (ref >=60)
Glucose, Bld: 96 mg/dL (ref 70–99)
POTASSIUM: 4.8 mmol/L (ref 3.5–5.3)
Sodium: 139 mmol/L (ref 135–146)
Total Bilirubin: 0.5 mg/dL (ref 0.2–1.2)
Total Protein: 6.6 g/dL (ref 6.1–8.1)

## 2016-08-11 LAB — LIPID PANEL
CHOL/HDL RATIO: 4.3 ratio (ref <5.0)
CHOLESTEROL: 222 mg/dL — AB (ref <200)
HDL: 52 mg/dL (ref >50)
LDL Cholesterol: 139 mg/dL — ABNORMAL HIGH (ref <100)
Triglycerides: 154 mg/dL — ABNORMAL HIGH (ref <150)
VLDL: 31 mg/dL — ABNORMAL HIGH (ref <30)

## 2016-08-11 LAB — CBC WITH DIFFERENTIAL/PLATELET
BASOS PCT: 0 %
Basophils Absolute: 0 cells/uL (ref 0–200)
Eosinophils Absolute: 57 cells/uL (ref 15–500)
Eosinophils Relative: 1 %
HEMATOCRIT: 43.9 % (ref 35.0–45.0)
HEMOGLOBIN: 14.4 g/dL (ref 12.0–15.0)
Lymphocytes Relative: 32 %
Lymphs Abs: 1824 cells/uL (ref 850–3900)
MCH: 30.5 pg (ref 27.0–33.0)
MCHC: 32.8 g/dL (ref 32.0–36.0)
MCV: 93 fL (ref 80.0–100.0)
MPV: 10.2 fL (ref 7.5–12.5)
Monocytes Absolute: 456 cells/uL (ref 200–950)
Monocytes Relative: 8 %
NEUTROS PCT: 59 %
Neutro Abs: 3363 cells/uL (ref 1500–7800)
Platelets: 267 10*3/uL (ref 140–400)
RBC: 4.72 MIL/uL (ref 3.80–5.10)
RDW: 14 % (ref 11.0–15.0)
WBC: 5.7 10*3/uL (ref 3.8–10.8)

## 2016-08-11 LAB — TSH: TSH: 2.53 m[IU]/L

## 2016-08-17 ENCOUNTER — Encounter: Payer: Self-pay | Admitting: Physician Assistant

## 2016-08-17 ENCOUNTER — Ambulatory Visit (INDEPENDENT_AMBULATORY_CARE_PROVIDER_SITE_OTHER): Payer: Medicare Other | Admitting: Physician Assistant

## 2016-08-17 VITALS — BP 132/76 | HR 73 | Temp 98.1°F | Resp 14 | Wt 152.6 lb

## 2016-08-17 DIAGNOSIS — M8580 Other specified disorders of bone density and structure, unspecified site: Secondary | ICD-10-CM | POA: Diagnosis not present

## 2016-08-17 DIAGNOSIS — R011 Cardiac murmur, unspecified: Secondary | ICD-10-CM | POA: Diagnosis not present

## 2016-08-17 DIAGNOSIS — E039 Hypothyroidism, unspecified: Secondary | ICD-10-CM

## 2016-08-17 DIAGNOSIS — Z0000 Encounter for general adult medical examination without abnormal findings: Secondary | ICD-10-CM

## 2016-08-17 DIAGNOSIS — R0989 Other specified symptoms and signs involving the circulatory and respiratory systems: Secondary | ICD-10-CM | POA: Diagnosis not present

## 2016-08-17 NOTE — Progress Notes (Signed)
Patient ID: AOI KOUNS MRN: [id number], DOB: March 11, 1946, 71 y.o. Date of Encounter: 08/17/2016,   Chief Complaint: Physical (CPE)  HPI: 71 y.o. y/o female here for CPE.    07/22/2015: She shows me a picture on her cell phone-- there is a picture of her with a pile of limbs and brush that she has been working on. Says she "works hard." Pt states she has moved into her mother's house to live with her mom. Patient's son has moved into patient's old house. As well, patient's aunt lives next door. 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 Says that the brush pile that is in her picture today is where she takes care of his property.  In addition to this "yardwork" she walks 1 mile a day. States that she has recently [facility] says she really enjoys that.   08/17/2016: Today she reports that she is still living with her mother. Her mom is 16 years old. States that she her mother has to take no medicines but is starting to have some dementia.  Pt still remains very active with keeping up the yard and the house. Says that she had to quit the water aerobics because she did not have time for that when she was having to mowe the yard 5 days a week. She states that she has continued to feel well and has no specific complaints or concerns to address today. Even with physical exertion, she has no angina symptoms. She is taking her thyroid medication daily.  Review of Systems: Consitutional: No fever, chills, fatigue, night sweats, lymphadenopathy. No significant/unexplained weight changes. Eyes: No visual changes, eye redness, or discharge. ENT/Mouth: No ear pain, sore throat, nasal drainage, or sinus pain. Cardiovascular: No chest pressure,heaviness, tightness or squeezing, even with exertion. No increased shortness of breath or dyspnea on exertion.No palpitations, edema, orthopnea,  PND. Respiratory: No cough, hemoptysis, SOB, or wheezing. Gastrointestinal: No anorexia, dysphagia, reflux, pain, nausea, vomiting, hematemesis, diarrhea, constipation, BRBPR, or melena. Breast: No mass, nodules, bulging, or retraction. No skin changes or inflammation. No nipple discharge. No lymphadenopathy. Genitourinary: No dysuria, hematuria, incontinence, vaginal discharge, pruritis, burning, abnormal bleeding, or pain. Musculoskeletal: No decreased ROM, No joint pain or swelling. No significant pain in neck, back, or extremities. Skin: No rash, pruritis, or concerning lesions. Neurological: No headache, dizziness, syncope, seizures, tremors, memory loss, coordination problems, or paresthesias. Psychological: No anxiety, depression, hallucinations, SI/HI. Endocrine: No polydipsia, polyphagia, polyuria, or known diabetes.No increased fatigue. No palpitations/rapid heart rate. No significant/unexplained weight change. All other systems were reviewed and are otherwise negative.  Past Medical History:  Diagnosis Date  . Hypothyroidism   . Osteopenia      Past Surgical History:  Procedure Laterality Date  . BREAST EXCISIONAL BIOPSY Bilateral     Home Meds:  Current Outpatient Prescriptions on File Prior to Visit  Medication Sig Dispense Refill  . Biotin 1 MG CAPS Take 1 capsule by mouth daily.    . Calcium Carbonate-Vitamin D (CALCIUM + D PO) Take 500 mg by mouth daily.    . Coenzyme Q10 (COQ10) 200 MG CAPS Take 1 capsule by mouth daily.    . Cyanocobalamin ([facility]S B-12) 1500 MCG TBDP Take 1 tablet by mouth daily.    . diphenhydrAMINE (BENADRYL) 50 MG tablet Take 50 mg by mouth at bedtime as needed for itching.    Marland Kitchen  levothyroxine (SYNTHROID, LEVOTHROID) 25 MCG tablet TAKE 1 TABLET EVERY DAY BEFORE BREAKFAST 90 tablet 2  . Multiple Vitamin (MULTIVITAMIN) tablet Take 1 tablet by mouth daily. Vitality    . OMEGA-3 KRILL OIL PO Take 1 capsule by mouth daily. Reported on 07/22/2015    .  Probiotic Product (PROBIOTIC DAILY PO) Take 1 capsule by mouth daily. Florify     No current facility-administered medications on file prior to visit.     Allergies:  Allergies  Allergen Reactions  . Niaspan [Niacin Er] Other (See Comments)    Severe flushing  . Phenazopyridine Hcl Other (See Comments)    Made sick    Social History   Social History  . Marital status: Married    Spouse name: N/A  . Number of children: N/A  . Years of education: N/A   Occupational History  . Not on file.   Social History Main Topics  . Smoking status: Never Smoker  . Smokeless tobacco: Never Used  . Alcohol use No  . Drug use: No  . Sexual activity: Not on file   Other Topics Concern  . Not on file   Social History Narrative   Lives with her mom.    Takes care of aunt who lives across the street.    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          Family History  Problem Relation Age of Onset  . Cancer Brother 59    ColoRectal Cancer    Physical Exam: Blood pressure 132/76, pulse 73, temperature 98.1 F (36.7 C), temperature source Oral, resp. rate 14, weight 152 lb 9.6 oz (69.2 kg), SpO2 98 %., Body mass index is 28.83 kg/m. General: Well developed, well nourished, WF. Appears  in no acute distress. HEENT: Normocephalic, atraumatic. Conjunctiva pink, sclera non-icteric. Pupils 2 mm constricting to 1 mm, round, regular, and equally reactive to light and accomodation. EOMI. Internal auditory canal clear. TMs with good cone of light and without pathology. Nasal mucosa pink. Nares are without discharge. No sinus tenderness. Oral mucosa pink.  Pharynx without exudate.   Neck: Supple. Trachea midline. No thyromegaly. Full ROM. No lymphadenopathy. Soft right carotid bruit. Positive left carotid bruit. Lungs: Clear to auscultation bilaterally without wheezes, rales, or rhonchi. Breathing is of normal effort and unlabored. Cardiovascular: RRR with S1 S2. III/VI  murmur present at apex and at 2nd ICS. Breast: Symmetrical. No masses. Nipples without discharge. Abdomen: Soft, non-tender, non-distended with normoactive bowel sounds. No hepatosplenomegaly or masses. No rebound/guarding. No CVA tenderness. No hernias.  Genitourinary: She refuses pelvic exam today (08/17/2016)  Musculoskeletal: Full range of motion and 5/5 strength throughout.   Skin: Warm and moist. Left forehead: there is one area that is scaly appearance, rough with palpation. There is small amount of pink erythema underlying the scale. Lesion approx 3 mm diameter, raised approx 2 mm (scale).  Neuro: A+Ox3. CN II-XII grossly intact. Moves all extremities spontaneously. Full sensation throughout. Normal gait. Psych:  Responds to questions appropriately with a normal affect.   Assessment/Plan:  71 y.o. y/o female here for CPE 1. Visit for preventive health examination   A. Screening Labs:  She recently came in had fasting labs. We reviewed these results today. All labs are normal. LDL has improved slightly and LDL 139.  At prior visit, discussed that she was borderline of considering statin therapy. Discussed this with her. Will continue to try to decrease saturated fats in  the diet and continue her walking.   B. Pap: Last Pap smear was performed here 02/27/2011. Cytology was normal. There was no HPV cotesting.  Latest guidelines state that no further pap indicated after age 40, as last pap nml and no h/o abnormality.  She had pelvic exam here 06/2013 which was normal.  Pelvic Exam--07/22/15--Normal.  08/17/2016--she defers pelvic exam.  C. Screening Mammogram: She just recently had mammogram 06/27/2014--negative. Repeat one year. At [place] 07/22/15 she states that she has mammogram scheduled for tomorrow. At [place] 08/17/2016 she states that she just had mammogram and received letter last week that it was negative.  D. DEXA/BMD:  At recent physical she had never had a DEXA/BMD. Placed referral  for this and she had this performed 06/02/2012.  Showed osteopenia in both sites.  Lumbar spine: T score: -1.7 Left hip: T score: -2.1 See # 35below.--" "5 . Osteopenia She does report that her mother has osteoporosis. Discussed with her her mother has had any fractures. She says her mother has only had an ankle fracture. Says that this occurred after she fell on a wet floor and experienced a hard fall. Patient does not smoke. She has been on no chronic steroid treatment. She is now doing weight-bearing exercise and walking 1 mile daily. She is on calcium and vitamin D. We can wait and monitor this and hold off on starting any prescription medications right now. At [place] 07/22/2015----Ordered F/U DEXA-- ADDENDUM--DEXA performed 07/30/2015-- T-Score -2.0. No statistically significant change in BMD of Lumbar Spine or Left Hip since 06/02/2012. Pt informed to cont Calcium, Vit D, Weight Bearing Exercise.   08/17/2016--- Continue the calcium, vitamin D, weightbearing exercise. Repeat DEXA not due until after 07/29/2017  E. Colorectal Cancer Screening: She had colonoscopy  performed by Dr. Penelope Coop 09/25/2004. She had another colonoscopy performed 01/11/2014. Found this listed in [place] but was unable to find the actual report and results to tell when she is supposed to have repeat.  F. Immunizations:  Influenza: She reports that she has already gotten the influenza vaccine for this season. Tetanus: Up-to-date. Given 04/08/2012 Pneumococcal: Pneumovax was given 02/2011                             Prevnar 13 --given here 06/26/2013  Zostavax:  She has received this 05/20/2012    2. Murmur On exam 08/17/16 murmur is heard. Will obtain echo to further evaluate. Pt voices understanding and agrees. - ECHOCARDIOGRAM COMPLETE; Future  3. Bilateral carotid bruits On exam 08/17/16 murmur is heard. As well I can hear some carotid bruits--left greater than right. This could be radiation of the murmur but may be  secondary to carotid stenosis so will obtain carotid artery ultrasound to evaluate.Pt voices understanding and agrees.  - ECHOCARDIOGRAM COMPLETE; Future - VAS US CAROTID; Future   4. Hypothyroidism Recent TSH is within normal limits. Continue current dose.  5 Osteopenia She does report that her mother has osteoporosis. Discussed with her her mother has had any fractures. She says her mother has only had an ankle fracture. Says that this occurred after she fell on a wet floor and experienced a hard fall. Patient does not smoke. She has been on no chronic steroid treatment. She is now doing weight-bearing exercise and walking 1 mile daily. She is on calcium and vitamin D. We can wait and monitor this and hold off on starting any prescription medications right now. At [facility] 07/22/2015----Ordered F/U  DEXA-- ADDENDUM--DEXA performed 07/30/2015-- T-Score -2.0. No statistically significant change in BMD of Lumbar Spine or Left Hip since 06/02/2012. Pt informed to cont Calcium, Vit D, Weight Bearing Exercise.   08/17/2016--- Continue the calcium, vitamin D, weightbearing exercise. Repeat DEXA not due until after 07/29/2017   6. Actinic Keratosis A.K on Left Forehead--- Cryo therapy applied 4 freeze- thaw cycles.  Subjective:   Patient presents for Medicare Annual/Subsequent preventive examination.   Review Past Medical/Family/Social: These are all documented in Epic and are all reviewed today.  Risk Factors  Current exercise habits:  Walking, Water Aerobics, "Yard Work" for 15 acres!!! Dietary issues discussed: Discussed decreasing cheese sour cream and casseroles. See note under lipids above.  Cardiac risk factors: Age, Lipids   Depression Screen  (Note: if answer to either of the following is "Yes", a more complete depression screening is indicated)  Over the past two weeks, have you felt down, depressed or hopeless? No Over the past two weeks, have you felt little interest or pleasure in  doing things? No Have you lost interest or pleasure in daily life? No Do you often feel hopeless? No Do you cry easily over simple problems? No   Activities of Daily Living  In your present state of health, do you have any difficulty performing the following activities?:  Driving? No  Managing money? No  Feeding yourself? No  Getting from bed to chair? No  Climbing a flight of stairs? No  Preparing food and eating?: No  Bathing or showering? No  Getting dressed: No  Getting to the toilet? No  Using the toilet:No  Moving around from place to place: No  In the past year have you fallen or had a near fall?:No  Are you sexually active? No  Do you have more than one partner? No   Hearing Difficulties: No  Do you often ask people to speak up or repeat themselves? No  Do you experience ringing or noises in your ears? No Do you have difficulty understanding soft or whispered voices? No  Do you feel that you have a problem with memory? No Do you often misplace items? No  Do you feel safe at home? Yes  Cognitive Testing  Alert? Yes Normal Appearance?Yes  Oriented to person? Yes Place? Yes  Time? Yes  Recall of three objects? Yes  Can perform simple calculations? Yes  Displays appropriate judgment?Yes  Can read the correct time from a watch face?Yes   List the Names of Other Physician/Practitioners you currently use:  GI--In EPic  Indicate any recent Medical Services you may have received from other than [facility] providers in the past year (date may be approximate).   Screening Tests / Date Colonoscopy --all of this information is listed above.                    Zostavax  Mammogram  Influenza Vaccine  Tetanus/tdap    Assessment:    Annual wellness medicare exam   Plan:    During the course of the visit the patient was educated and counseled about appropriate screening and preventive services including:  Screening mammography  Colorectal cancer screening  Shingles  vaccine. Prescription given to that she can get the vaccine at the pharmacy or Medicare part D.  Screen + for depression. PHQ- 9 score of 12 (moderate depression). We discussed the options of counseling versus possibly a medication. I encouraged her strongly think about the counseling. She is going through some medical problems currently  and her husband is as well Mrs. been very stressful for her. She says she will think about it. She does have Xanax to use as needed. Though she may benefit from an SSRI for her more depressive type symptoms but she wants to hold off at this time.  I aksed her to please have her cardioloist send records since we have none on file.  Diet review for nutrition referral? Yes ____ Not Indicated __x__  Patient Instructions (the written plan) was given to the patient.  Medicare Attestation  I have personally reviewed:  The patient's medical and social history  Their use of alcohol, tobacco or illicit drugs  Their current medications and supplements  The patient's functional ability including ADLs,fall risks, home safety risks, cognitive, and hearing and visual impairment  Diet and physical activities  Evidence for depression or mood disorders  The patient's weight, height, BMI, and visual acuity have been recorded in the chart. I have made referrals, counseling, and provided education to the patient based on review of the above and I have provided the patient with a written personalized care plan for preventive services.        Marin Olp [place], [place], [facility] 08/17/2016 3:14 PM

## 2016-09-01 ENCOUNTER — Other Ambulatory Visit: Payer: Self-pay

## 2016-09-01 ENCOUNTER — Ambulatory Visit (HOSPITAL_COMMUNITY): Payer: Medicare Other | Attending: Internal Medicine

## 2016-09-01 DIAGNOSIS — R0989 Other specified symptoms and signs involving the circulatory and respiratory systems: Secondary | ICD-10-CM

## 2016-09-01 DIAGNOSIS — I517 Cardiomegaly: Secondary | ICD-10-CM | POA: Diagnosis not present

## 2016-09-01 DIAGNOSIS — R011 Cardiac murmur, unspecified: Secondary | ICD-10-CM | POA: Diagnosis not present

## 2016-09-01 DIAGNOSIS — I340 Nonrheumatic mitral (valve) insufficiency: Secondary | ICD-10-CM | POA: Insufficient documentation

## 2016-09-04 ENCOUNTER — Ambulatory Visit (HOSPITAL_COMMUNITY)
Admission: RE | Admit: 2016-09-04 | Discharge: 2016-09-04 | Disposition: A | Discharge status: Home / Self Care | Payer: Medicare Other | Source: Ambulatory Visit | Attending: Vascular Surgery | Admitting: Vascular Surgery

## 2016-09-04 DIAGNOSIS — R0989 Other specified symptoms and signs involving the circulatory and respiratory systems: Secondary | ICD-10-CM | POA: Diagnosis not present

## 2016-09-04 LAB — VAS US CAROTID
LCCAPDIAS: 27 cm/s
LCCAPSYS: 115 cm/s
LEFT ECA DIAS: -13 cm/s
LEFT VERTEBRAL DIAS: 14 cm/s
LICAPSYS: 68 cm/s
Left CCA dist dias: -22 cm/s
Left CCA dist sys: -69 cm/s
Left ICA dist dias: -19 cm/s
Left ICA dist sys: -48 cm/s
Left ICA prox dias: 18 cm/s
RCCADSYS: -65 cm/s
RCCAPDIAS: 19 cm/s
RCCAPSYS: 116 cm/s
RIGHT CCA MID DIAS: -13 cm/s
RIGHT ECA DIAS: -9 cm/s
RIGHT VERTEBRAL DIAS: 9 cm/s

## 2016-11-14 ENCOUNTER — Other Ambulatory Visit: Payer: Self-pay | Admitting: Physician Assistant

## 2016-12-30 ENCOUNTER — Ambulatory Visit (INDEPENDENT_AMBULATORY_CARE_PROVIDER_SITE_OTHER): Payer: Medicare Other | Admitting: Physician Assistant

## 2016-12-30 ENCOUNTER — Encounter: Payer: Self-pay | Admitting: Physician Assistant

## 2016-12-30 VITALS — BP 108/70 | HR 84 | Temp 97.9°F | Resp 14 | Ht 61.0 in | Wt 156.4 lb

## 2016-12-30 DIAGNOSIS — R8290 Unspecified abnormal findings in urine: Secondary | ICD-10-CM

## 2016-12-30 DIAGNOSIS — N390 Urinary tract infection, site not specified: Secondary | ICD-10-CM

## 2016-12-30 LAB — URINALYSIS, ROUTINE W REFLEX MICROSCOPIC
Bilirubin Urine: NEGATIVE
Glucose, UA: NEGATIVE
Ketones, ur: NEGATIVE
NITRITE: NEGATIVE
PH: 6 (ref 5.0–8.0)
Protein, ur: NEGATIVE
SPECIFIC GRAVITY, URINE: 1.01 (ref 1.001–1.035)

## 2016-12-30 LAB — URINALYSIS, MICROSCOPIC ONLY
CRYSTALS: NONE SEEN [HPF]
Casts: NONE SEEN [LPF]
SQUAMOUS EPITHELIAL / LPF: NONE SEEN [HPF] (ref <=5)
Yeast: NONE SEEN [HPF]

## 2016-12-30 MED ORDER — CIPROFLOXACIN HCL 500 MG PO TABS: 500.0000 mg | ORAL_TABLET | Freq: Two times a day (BID) | ORAL | 0 refills | Status: DC

## 2016-12-30 NOTE — Progress Notes (Signed)
Patient ID: Maria Gomez MRN: [id number], DOB: Oct 09, 1945, 71 y.o. Date of Encounter: 12/30/2016, 10:25 AM    Chief Complaint:  Chief Complaint  Patient presents with  . Urinary Frequency  . urine odor  . abnormal urine color     HPI: 71 y.o. year old female presents with above.   She reports that she has been having urinary frequency. "Have to go to the bathroom every 5 minutes ". Has been drinking cranberry juice but symptoms not resolving. No fevers or chills. No back pain.     Home Meds:   Outpatient Medications Prior to Visit  Medication Sig Dispense Refill  . Biotin 1 MG CAPS Take 1 capsule by mouth daily.    . Calcium Carbonate-Vitamin D (CALCIUM + D PO) Take 500 mg by mouth daily.    . Coenzyme Q10 (COQ10) 200 MG CAPS Take 1 capsule by mouth daily.    . Cyanocobalamin ([facility] B-12) 1500 MCG TBDP Take 1 tablet by mouth daily.    . diphenhydrAMINE (BENADRYL) 50 MG tablet Take 50 mg by mouth at bedtime as needed for itching.    . levothyroxine (SYNTHROID, LEVOTHROID) 25 MCG tablet TAKE 1 TABLET EVERY DAY BEFORE BREAKFAST 90 tablet 2  . Multiple Vitamin (MULTIVITAMIN) tablet Take 1 tablet by mouth daily. Vitality    . OMEGA-3 KRILL OIL PO Take 1 capsule by mouth daily. Reported on 07/22/2015    . Probiotic Product (PROBIOTIC DAILY PO) Take 1 capsule by mouth daily. Florify     No facility-administered medications prior to visit.     Allergies:  Allergies  Allergen Reactions  . Niaspan [Niacin Er] Other (See Comments)    Severe flushing  . Phenazopyridine Hcl Other (See Comments)    Made sick      Review of Systems: See HPI for pertinent ROS. All other ROS negative.    Physical Exam: Blood pressure 108/70, pulse 84, temperature 97.9 F (36.6 C), temperature source Oral, resp. rate 14, height 5\' 1"  (1.549 m), weight 156 lb 6.4 oz (70.9 kg), SpO2 98 %., Body mass index is 29.55 kg/m. General:  WNWD WF. Appears in no acute distress. Neck: Supple. No  thyromegaly. No lymphadenopathy. Lungs: Clear bilaterally to auscultation without wheezes, rales, or rhonchi. Breathing is unlabored. Heart: Regular rhythm. No murmurs, rubs, or gallops. Abdomen: Soft, non-tender, non-distended with normoactive bowel sounds. No hepatomegaly. No rebound/guarding. No obvious abdominal masses. No tenderness with palpation, even in the low abdomen/suprapubic region. Msk:  Strength and tone normal for age. No tenderness with percussion to costophrenic angle bilaterally. Extremities/Skin: Warm and dry.  Neuro: Alert and oriented X 3. Moves all extremities spontaneously. Gait is normal. CNII-XII grossly in tact. Psych:  Responds to questions appropriately with a normal affect.   Results for orders placed or performed in visit on 12/30/16  Urinalysis, Routine w reflex microscopic  Result Value Ref Range   Color, Urine YELLOW YELLOW   APPearance CLOUDY (A) CLEAR   Specific Gravity, Urine 1.010 1.001 - 1.035   pH 6.0 5.0 - 8.0   Glucose, UA NEGATIVE NEGATIVE   Bilirubin Urine NEGATIVE NEGATIVE   Ketones, ur NEGATIVE NEGATIVE   Hgb urine dipstick 3+ (A) NEGATIVE   Protein, ur NEGATIVE NEGATIVE   Nitrite NEGATIVE NEGATIVE   Leukocytes, UA 2+ (A) NEGATIVE  Urine Microscopic  Result Value Ref Range   WBC, UA 40-60 (A) <=5 WBC/HPF   RBC / HPF 0-2 <=2 RBC/HPF   Squamous Epithelial / LPF NONE  SEEN <=5 HPF   Bacteria, UA MODERATE (A) NONE SEEN HPF   Crystals NONE SEEN NONE SEEN HPF   Casts NONE SEEN NONE SEEN LPF   Yeast NONE SEEN NONE SEEN HPF     ASSESSMENT AND PLAN:  71 y.o. year old female with  1. Urinary tract infection without hematuria, site unspecified Reviewed with her that she had UTI December 2017. However prior to that had not had UTIs. After further discussion she states that if she only urinates and she does wipe back to front. Discussed with her that she needs to always wipe front to back even if she is only urinating always wipe front to  back. Also reviewed urine culture result from December 2017. That infection was sensitive to Cipro so we'll go ahead and use Cipro again this time. She is to take first dose as soon as she can take as directed and complete all of it. Follow-up if symptoms worsen or if they do not resolve upon completion of antibiotic. - ciprofloxacin (CIPRO) 500 MG tablet; Take 1 tablet (500 mg total) by mouth 2 (two) times daily.  Dispense: 14 tablet; Refill: 0  2. Abnormal urine odor - Urinalysis, Routine w reflex microscopic   Signed, [address] [place], [place], [facility] 12/30/2016 10:25 AM

## 2017-01-27 DIAGNOSIS — L821 Other seborrheic keratosis: Secondary | ICD-10-CM | POA: Diagnosis not present

## 2017-03-01 DIAGNOSIS — Z23 Encounter for immunization: Secondary | ICD-10-CM | POA: Diagnosis not present

## 2017-03-29 ENCOUNTER — Ambulatory Visit (INDEPENDENT_AMBULATORY_CARE_PROVIDER_SITE_OTHER): Payer: Medicare Other | Admitting: Physician Assistant

## 2017-03-29 ENCOUNTER — Encounter: Payer: Self-pay | Admitting: Physician Assistant

## 2017-03-29 VITALS — BP 124/78 | HR 90 | Temp 98.3°F | Resp 14 | Wt 154.4 lb

## 2017-03-29 DIAGNOSIS — J988 Other specified respiratory disorders: Secondary | ICD-10-CM

## 2017-03-29 DIAGNOSIS — B9689 Other specified bacterial agents as the cause of diseases classified elsewhere: Secondary | ICD-10-CM

## 2017-03-29 MED ORDER — AZITHROMYCIN 250 MG PO TABS: ORAL_TABLET | ORAL | 0 refills | Status: DC

## 2017-03-29 NOTE — Progress Notes (Signed)
Patient ID: KAILIN PRINCIPATO MRN: [id number], DOB: [birth year], 71 y.o. Date of Encounter: 03/29/2017, 11:46 AM    Chief Complaint:  Chief Complaint  Patient presents with  . Cough    going on for about a month     HPI: 71 y.o. year old female presents with above.   She states that she has been having cough and phlegm in her chest for about a month.   States that she has had no congestion in her head or nose and has had no mucus from her nose.   Has had no fevers or chills.  Says that she really has not even felt bad.  Therefore kept thinking that she would be able to fight this off on her own but decided it has gone on long enough and needed to come in.  No other concerns to address today.     Home Meds:   Outpatient Medications Prior to Visit  Medication Sig Dispense Refill  . Biotin 1 MG CAPS Take 1 capsule by mouth daily.    . Calcium Carbonate-Vitamin D (CALCIUM + D PO) Take 500 mg by mouth daily.    . Coenzyme Q10 (COQ10) 200 MG CAPS Take 1 capsule by mouth daily.    . Cyanocobalamin ([facility]S B-12) 1500 MCG TBDP Take 1 tablet by mouth daily.    . diphenhydrAMINE (BENADRYL) 50 MG tablet Take 50 mg by mouth at bedtime as needed for itching.    . levothyroxine (SYNTHROID, LEVOTHROID) 25 MCG tablet TAKE 1 TABLET EVERY DAY BEFORE BREAKFAST 90 tablet 2  . Multiple Vitamin (MULTIVITAMIN) tablet Take 1 tablet by mouth daily. Vitality    . OMEGA-3 KRILL OIL PO Take 1 capsule by mouth daily. Reported on 07/22/2015    . Probiotic Product (PROBIOTIC DAILY PO) Take 1 capsule by mouth daily. Florify    . ciprofloxacin (CIPRO) 500 MG tablet Take 1 tablet (500 mg total) by mouth 2 (two) times daily. 14 tablet 0   No facility-administered medications prior to visit.     Allergies:  Allergies  Allergen Reactions  . Niaspan [Niacin Er] Other (See Comments)    Severe flushing  . Phenazopyridine Hcl Other (See Comments)    Made sick      Review of Systems: See HPI for pertinent ROS.  All other ROS negative.    Physical Exam: Blood pressure 124/78, pulse 90, temperature 98.3 F (36.8 C), temperature source Oral, resp. rate 14, weight 70 kg (154 lb 6.4 oz), SpO2 99 %., Body mass index is 29.17 kg/m. General:  WNWD WF. Appears in no acute distress. HEENT: Normocephalic, atraumatic, eyes without discharge, sclera non-icteric, nares are without discharge. Bilateral auditory canals clear, TM's are without perforation, pearly grey and translucent with reflective cone of light bilaterally. Oral cavity moist, posterior pharynx without exudate, erythema, peritonsillar abscess.  Neck: Supple. No thyromegaly. No lymphadenopathy. Lungs: Clear bilaterally to auscultation without wheezes, rales, or rhonchi. Breathing is unlabored. Heart: Regular rhythm. No murmurs, rubs, or gallops. Msk:  Strength and tone normal for age. Extremities/Skin: Warm and dry.  Neuro: Alert and oriented X 3. Moves all extremities spontaneously. Gait is normal. CNII-XII grossly in tact. Psych:  Responds to questions appropriately with a normal affect.     ASSESSMENT AND PLAN:  71 y.o. year old female with  1. Bacterial respiratory infection She is to take antibiotic as directed.  Follow-up if symptoms do not resolve within 1 week after completion of antibiotic. - azithromycin (ZITHROMAX) 250 MG  tablet; Day 1: Take 2 daily. Days 2 -5: Take 1 daily.  Dispense: 6 tablet; Refill: 0   Signed, [address] [place], [place], [facility] 03/29/2017 11:46 AM

## 2017-04-05 ENCOUNTER — Telehealth: Payer: Self-pay

## 2017-04-05 MED ORDER — LEVOFLOXACIN 750 MG PO TABS: 750.0000 mg | ORAL_TABLET | Freq: Every day | ORAL | 0 refills | Status: DC

## 2017-04-05 NOTE — Telephone Encounter (Signed)
Levaquin 750mg  1 po QD x 7 days # 7 +0 If cough does not resolve after this, let me know -- then will need to get chest xray.

## 2017-04-05 NOTE — Telephone Encounter (Signed)
Patient called and states she still has a cough, no fever, no sore throat,patietn states she still have congestion and is requesting another  rx for the cough. Pls advise

## 2017-04-05 NOTE — Telephone Encounter (Signed)
rx sent to pharmacy pt aware of provider recommendations

## 2017-04-19 ENCOUNTER — Telehealth: Payer: Self-pay | Admitting: Physician Assistant

## 2017-04-19 DIAGNOSIS — R05 Cough: Secondary | ICD-10-CM

## 2017-04-19 DIAGNOSIS — R053 Chronic cough: Secondary | ICD-10-CM

## 2017-04-19 NOTE — Telephone Encounter (Signed)
At this point she needs to go for chest x-ray. Place order for chest x-ray. Give patient information of where to go to [place], [facility] address  Reviewed chart.   She had office visit 03/29/17 and was prescribed azithromycin. She had phone call 04/05/17 and was prescribed Levaquin.

## 2017-04-19 NOTE — Telephone Encounter (Signed)
Patient is aware an order has been in for her to have a chest xray done

## 2017-04-19 NOTE — Telephone Encounter (Signed)
Please See note below and advise

## 2017-04-19 NOTE — Telephone Encounter (Signed)
Patient is calling to say her cough is not better after taking all of her antibiotic, should she go ahead with chest xray, or please advise what she should do  [phone]

## 2017-04-20 ENCOUNTER — Ambulatory Visit
Admission: RE | Admit: 2017-04-20 | Discharge: 2017-04-20 | Disposition: A | Discharge status: Home / Self Care | Payer: Medicare Other | Source: Ambulatory Visit | Attending: Physician Assistant | Admitting: Physician Assistant

## 2017-04-20 ENCOUNTER — Other Ambulatory Visit: Payer: Self-pay

## 2017-04-20 DIAGNOSIS — R05 Cough: Secondary | ICD-10-CM

## 2017-04-20 DIAGNOSIS — R053 Chronic cough: Secondary | ICD-10-CM

## 2017-04-20 MED ORDER — OMEPRAZOLE 20 MG PO CPDR: 20.0000 mg | DELAYED_RELEASE_CAPSULE | Freq: Every day | ORAL | 2 refills | Status: DC

## 2017-04-20 MED ORDER — CETIRIZINE HCL 10 MG PO TABS: 10.0000 mg | ORAL_TABLET | Freq: Every day | ORAL | 2 refills | Status: DC

## 2017-05-03 ENCOUNTER — Ambulatory Visit: Payer: Self-pay | Admitting: Physician Assistant

## 2017-05-10 ENCOUNTER — Ambulatory Visit (INDEPENDENT_AMBULATORY_CARE_PROVIDER_SITE_OTHER): Payer: Medicare Other | Admitting: Physician Assistant

## 2017-05-10 ENCOUNTER — Encounter: Payer: Self-pay | Admitting: Physician Assistant

## 2017-05-10 ENCOUNTER — Other Ambulatory Visit: Payer: Self-pay

## 2017-05-10 VITALS — BP 100/72 | HR 74 | Temp 98.3°F | Resp 14 | Ht 61.0 in | Wt 154.4 lb

## 2017-05-10 DIAGNOSIS — R011 Cardiac murmur, unspecified: Secondary | ICD-10-CM | POA: Diagnosis not present

## 2017-05-10 DIAGNOSIS — M8580 Other specified disorders of bone density and structure, unspecified site: Secondary | ICD-10-CM

## 2017-05-10 DIAGNOSIS — R05 Cough: Secondary | ICD-10-CM | POA: Diagnosis not present

## 2017-05-10 DIAGNOSIS — E039 Hypothyroidism, unspecified: Secondary | ICD-10-CM

## 2017-05-10 DIAGNOSIS — R0989 Other specified symptoms and signs involving the circulatory and respiratory systems: Secondary | ICD-10-CM

## 2017-05-10 DIAGNOSIS — R059 Cough, unspecified: Secondary | ICD-10-CM

## 2017-05-10 NOTE — Progress Notes (Signed)
Patient ID: Maria Gomez MRN: [id number], DOB: 08-Jan-1946, 71 y.o. Date of Encounter: 05/10/2017,   Chief Complaint: Physical (CPE)  HPI: 71 y.o. y/o female here for CPE.    07/22/2015: She shows me a picture on her cell phone-- there is a picture of her with a pile of limbs and brush that she has been working on. Says she "works hard." Pt states she has moved into her mother's house to live with her mom. Patient's son has moved into patient's old house. As well, patient's aunt lives next door. 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 Says that the brush pile that is in her picture today is where she takes care of his property.  In addition to this "yardwork" she walks 1 mile a day. States that she has recently [facility] says she really enjoys that.   08/17/2016: Today she reports that she is still living with her mother. Her mom is 72 years old. States that she her mother has to take no medicines but is starting to have some dementia.  Pt still remains very active with keeping up the yard and the house. Says that she had to quit the water aerobics because she did not have time for that when she was having to mowe the yard 5 days a week. She states that she has continued to feel well and has no specific complaints or concerns to address today. Even with physical exertion, she has no angina symptoms. She is taking her thyroid medication daily.  05/10/2017: She had recent office visits with me secondary to cough.  Was treated with antibiotics.  Cough persisted.  Chest X-ray performed 04/20/17 was normal.  Then had her start Zyrtec and omeprazole daily.  Today she states that she has been taking these 2 medications daily and that the cough is resolved. She is taking thyroid medication daily as directed.     Review of Systems: Consitutional: No fever, chills, fatigue, night sweats,  lymphadenopathy. No significant/unexplained weight changes. Eyes: No visual changes, eye redness, or discharge. ENT/Mouth: No ear pain, sore throat, nasal drainage, or sinus pain. Cardiovascular: No chest pressure,heaviness, tightness or squeezing, even with exertion. No increased shortness of breath or dyspnea on exertion.No palpitations, edema, orthopnea, PND. Respiratory: No cough, hemoptysis, SOB, or wheezing. Gastrointestinal: No anorexia, dysphagia, reflux, pain, nausea, vomiting, hematemesis, diarrhea, constipation, BRBPR, or melena. Breast: No mass, nodules, bulging, or retraction. No skin changes or inflammation. No nipple discharge. No lymphadenopathy. Genitourinary: No dysuria, hematuria, incontinence, vaginal discharge, pruritis, burning, abnormal bleeding, or pain. Musculoskeletal: No decreased ROM, No joint pain or swelling. No significant pain in neck, back, or extremities. Skin: No rash, pruritis, or concerning lesions. Neurological: No headache, dizziness, syncope, seizures, tremors, memory loss, coordination problems, or paresthesias. Psychological: No anxiety, depression, hallucinations, SI/HI. Endocrine: No polydipsia, polyphagia, polyuria, or known diabetes.No increased fatigue. No palpitations/rapid heart rate. No significant/unexplained weight change. All other systems were reviewed and are otherwise negative.  Past Medical History:  Diagnosis Date  . Hypothyroidism   . Osteopenia      Past Surgical History:  Procedure Laterality Date  . BREAST EXCISIONAL BIOPSY Bilateral     Home Meds:  Current Outpatient Medications on File Prior to Visit  Medication Sig Dispense Refill  . Biotin 1 MG CAPS Take 1 capsule by mouth daily.    . Calcium Carbonate-Vitamin D (  CALCIUM + D PO) Take 500 mg by mouth daily.    . cetirizine (ZYRTEC) 10 MG tablet Take 1 tablet (10 mg total) by mouth daily. 30 tablet 2  . Coenzyme Q10 (COQ10) 200 MG CAPS Take 1 capsule by mouth daily.    .  Cyanocobalamin (CVS B-12) 1500 MCG TBDP Take 1 tablet by mouth daily.    . diphenhydrAMINE (BENADRYL) 50 MG tablet Take 50 mg by mouth at bedtime as needed for itching.    . levothyroxine (SYNTHROID, LEVOTHROID) 25 MCG tablet TAKE 1 TABLET EVERY DAY BEFORE BREAKFAST 90 tablet 2  . Multiple Vitamin (MULTIVITAMIN) tablet Take 1 tablet by mouth daily. Vitality    . OMEGA-3 KRILL OIL PO Take 1 capsule by mouth daily. Reported on 07/22/2015    . omeprazole (PRILOSEC) 20 MG capsule Take 1 capsule (20 mg total) by mouth daily. 30 capsule 2  . Probiotic Product (PROBIOTIC DAILY PO) Take 1 capsule by mouth daily. Florify     No current facility-administered medications on file prior to visit.     Allergies:  Allergies  Allergen Reactions  . Niaspan [Niacin Er] Other (See Comments)    Severe flushing  . Phenazopyridine Hcl Other (See Comments)    Made sick    Social History   Socioeconomic History  . Marital status: Married    Spouse name: Not on file  . Number of children: Not on file  . Years of education: Not on file  . Highest education level: Not on file  Social Needs  . Financial resource strain: Not on file  . Food insecurity - worry: Not on file  . Food insecurity - inability: Not on file  . Transportation needs - medical: Not on file  . Transportation needs - non-medical: Not on file  Occupational History  . Not on file  Tobacco Use  . Smoking status: Never Smoker  . Smokeless tobacco: Never Used  Substance and Sexual Activity  . Alcohol use: No  . Drug use: No  . Sexual activity: Not on file  Other Topics Concern  . Not on file  Social History Narrative   Lives with her mom.    Takes care of aunt who lives across the street.    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          Family History  Problem Relation Age of Onset  . Cancer Brother 45       ColoRectal Cancer    Physical Exam: Blood pressure 100/72, pulse 74, temperature 98.3 F  (36.8 C), temperature source Oral, resp. rate 14, height 5\' 1"  (1.549 m), weight 70 kg (154 lb 6.4 oz), SpO2 98 %., Body mass index is 29.17 kg/m. General: Well developed, well nourished, WF. Appears  in no acute distress. Neck: Supple. Trachea midline. No thyromegaly. Full ROM. No lymphadenopathy. Soft right carotid bruit. Positive left carotid bruit. Lungs: Clear to auscultation bilaterally without wheezes, rales, or rhonchi. Breathing is of normal effort and unlabored. Cardiovascular: RRR with S1 S2. III/VI murmur present at apex and at 2nd ICS. Abdomen: Soft, non-tender, non-distended with normoactive bowel sounds. No hepatosplenomegaly or masses. No rebound/guarding. No CVA tenderness. No hernias.  Musculoskeletal: Full range of motion and 5/5 strength throughout.   Skin: Warm and moist. Neuro: A+Ox3. CN II-XII grossly intact. Moves all extremities spontaneously. Full sensation throughout. Normal gait. Psych:  Responds to questions appropriately with a normal affect.   Assessment/Plan:  71  y.o. y/o female here for   1. Hypothyroidism, unspecified type She is taking thyroid medication daily.  Check lab to monitor. - TSH  2. Osteopenia, unspecified location She does report that her mother has osteoporosis. Discussed with her her mother has had any fractures. She says her mother has only had an ankle fracture. Says that this occurred after she fell on a wet floor and experienced a hard fall. Patient does not smoke. She has been on no chronic steroid treatment. She is now doing weight-bearing exercise and walking 1 mile daily. She is on calcium and vitamin D. We can wait and monitor this and hold off on starting any prescription medications right now. At [facility] 07/22/2015----Ordered F/U DEXA-- ADDENDUM--DEXA performed 07/30/2015-- T-Score -2.0. No statistically significant change in BMD of Lumbar Spine or Left Hip since 06/02/2012. Pt informed to cont Calcium, Vit D, Weight Bearing Exercise.    05/10/2017--- Continue the calcium, vitamin D, weightbearing exercise. Repeat DEXA not due until after 07/29/2017   3. Murmur Echocardiogram was performed 08/2016.  EF 60-65%.  Mitral valve showed trace to mild regurgitation.  Aortic valve showed sclerosis without stenosis.  No other significant abnormalities.  4. Bilateral carotid bruits Carotid Doppler was performed 09/04/16.  No significant stenosis of bilateral external or common carotid arteries.  No significant stenosis demonstrated.  5. Cough She is currently taking Zyrtec and omeprazole daily.  Cough has resolved.  She can stop these medications and if cough recurs then can start back one medication at a time.    [address] [place], [place], [facility] 05/10/2017 12:07 PM

## 2017-05-11 LAB — TSH: TSH: 3.74 m[IU]/L (ref 0.40–4.50)

## 2017-05-13 ENCOUNTER — Other Ambulatory Visit: Payer: Self-pay

## 2017-05-13 MED ORDER — LEVOTHYROXINE SODIUM 25 MCG PO TABS: ORAL_TABLET | ORAL | 2 refills | Status: DC

## 2017-06-25 ENCOUNTER — Other Ambulatory Visit: Payer: Self-pay | Admitting: Physician Assistant

## 2017-06-25 DIAGNOSIS — Z1231 Encounter for screening mammogram for malignant neoplasm of breast: Secondary | ICD-10-CM

## 2017-07-27 DIAGNOSIS — R69 Illness, unspecified: Secondary | ICD-10-CM | POA: Diagnosis not present

## 2017-08-10 ENCOUNTER — Ambulatory Visit
Admission: RE | Admit: 2017-08-10 | Discharge: 2017-08-10 | Disposition: A | Discharge status: Home / Self Care | Payer: Medicare HMO | Source: Ambulatory Visit | Attending: Physician Assistant | Admitting: Physician Assistant

## 2017-08-10 DIAGNOSIS — Z1231 Encounter for screening mammogram for malignant neoplasm of breast: Secondary | ICD-10-CM

## 2017-08-16 ENCOUNTER — Other Ambulatory Visit: Payer: Medicare Other

## 2017-08-16 DIAGNOSIS — Z0000 Encounter for general adult medical examination without abnormal findings: Secondary | ICD-10-CM

## 2017-08-16 DIAGNOSIS — Z13220 Encounter for screening for lipoid disorders: Secondary | ICD-10-CM | POA: Diagnosis not present

## 2017-08-16 DIAGNOSIS — Z1329 Encounter for screening for other suspected endocrine disorder: Secondary | ICD-10-CM | POA: Diagnosis not present

## 2017-08-17 LAB — CBC WITH DIFFERENTIAL/PLATELET
BASOS ABS: 29 {cells}/uL (ref 0–200)
Basophils Relative: 0.3 %
Eosinophils Absolute: 115 cells/uL (ref 15–500)
Eosinophils Relative: 1.2 %
HCT: 40.1 % (ref 35.0–45.0)
Hemoglobin: 13.8 g/dL (ref 11.7–15.5)
LYMPHS ABS: 1565 {cells}/uL (ref 850–3900)
MCH: 31.4 pg (ref 27.0–33.0)
MCHC: 34.4 g/dL (ref 32.0–36.0)
MCV: 91.1 fL (ref 80.0–100.0)
MPV: 11 fL (ref 7.5–12.5)
Monocytes Relative: 8.4 %
NEUTROS PCT: 73.8 %
Neutro Abs: 7085 cells/uL (ref 1500–7800)
PLATELETS: 240 10*3/uL (ref 140–400)
RBC: 4.4 10*6/uL (ref 3.80–5.10)
RDW: 12.3 % (ref 11.0–15.0)
TOTAL LYMPHOCYTE: 16.3 %
WBC: 9.6 10*3/uL (ref 3.8–10.8)
WBCMIX: 806 {cells}/uL (ref 200–950)

## 2017-08-17 LAB — LIPID PANEL
CHOLESTEROL: 199 mg/dL (ref <200)
HDL: 45 mg/dL — AB (ref >50)
LDL Cholesterol (Calc): 123 mg/dL (calc) — ABNORMAL HIGH
NON-HDL CHOLESTEROL (CALC): 154 mg/dL — AB (ref <130)
TRIGLYCERIDES: 193 mg/dL — AB (ref <150)
Total CHOL/HDL Ratio: 4.4 (calc) (ref <5.0)

## 2017-08-17 LAB — COMPLETE METABOLIC PANEL WITH GFR
AG RATIO: 1.5 (calc) (ref 1.0–2.5)
ALKALINE PHOSPHATASE (APISO): 72 U/L (ref 33–130)
ALT: 12 U/L (ref 6–29)
AST: 13 U/L (ref 10–35)
Albumin: 4.1 g/dL (ref 3.6–5.1)
BILIRUBIN TOTAL: 0.5 mg/dL (ref 0.2–1.2)
BUN/Creatinine Ratio: 16 (calc) (ref 6–22)
BUN: 15 mg/dL (ref 7–25)
CALCIUM: 9.5 mg/dL (ref 8.6–10.4)
CHLORIDE: 103 mmol/L (ref 98–110)
CO2: 28 mmol/L (ref 20–32)
Creat: 0.95 mg/dL — ABNORMAL HIGH (ref 0.60–0.93)
GFR, EST NON AFRICAN AMERICAN: 60 mL/min/{1.73_m2} (ref >=60)
GFR, Est African American: 70 mL/min/{1.73_m2} (ref >=60)
Globulin: 2.7 g/dL (calc) (ref 1.9–3.7)
Glucose, Bld: 106 mg/dL — ABNORMAL HIGH (ref 65–99)
POTASSIUM: 4.7 mmol/L (ref 3.5–5.3)
SODIUM: 140 mmol/L (ref 135–146)
Total Protein: 6.8 g/dL (ref 6.1–8.1)

## 2017-08-17 LAB — TSH: TSH: 2.32 m[IU]/L (ref 0.40–4.50)

## 2017-08-23 ENCOUNTER — Encounter: Payer: Self-pay | Admitting: Physician Assistant

## 2017-08-23 ENCOUNTER — Other Ambulatory Visit: Payer: Self-pay

## 2017-08-23 ENCOUNTER — Ambulatory Visit (INDEPENDENT_AMBULATORY_CARE_PROVIDER_SITE_OTHER): Payer: Medicare HMO | Admitting: Physician Assistant

## 2017-08-23 VITALS — BP 138/82 | HR 84 | Temp 98.5°F | Resp 16 | Ht 61.5 in | Wt 153.2 lb

## 2017-08-23 DIAGNOSIS — R011 Cardiac murmur, unspecified: Secondary | ICD-10-CM | POA: Diagnosis not present

## 2017-08-23 DIAGNOSIS — M8580 Other specified disorders of bone density and structure, unspecified site: Secondary | ICD-10-CM

## 2017-08-23 DIAGNOSIS — Z0000 Encounter for general adult medical examination without abnormal findings: Secondary | ICD-10-CM

## 2017-08-23 DIAGNOSIS — R69 Illness, unspecified: Secondary | ICD-10-CM | POA: Diagnosis not present

## 2017-08-23 DIAGNOSIS — F419 Anxiety disorder, unspecified: Secondary | ICD-10-CM | POA: Diagnosis not present

## 2017-08-23 DIAGNOSIS — E039 Hypothyroidism, unspecified: Secondary | ICD-10-CM | POA: Diagnosis not present

## 2017-08-23 DIAGNOSIS — J069 Acute upper respiratory infection, unspecified: Secondary | ICD-10-CM

## 2017-08-23 MED ORDER — CETIRIZINE HCL 10 MG PO TABS: 10.0000 mg | ORAL_TABLET | Freq: Every day | ORAL | 0 refills | Status: DC

## 2017-08-23 MED ORDER — OMEPRAZOLE 20 MG PO CPDR: 20.0000 mg | DELAYED_RELEASE_CAPSULE | Freq: Every day | ORAL | 0 refills | Status: DC

## 2017-08-23 MED ORDER — AZITHROMYCIN 250 MG PO TABS: ORAL_TABLET | ORAL | 0 refills | Status: DC

## 2017-08-23 MED ORDER — LEVOTHYROXINE SODIUM 25 MCG PO TABS: ORAL_TABLET | ORAL | 2 refills | Status: DC

## 2017-08-23 MED ORDER — ALPRAZOLAM 0.25 MG PO TABS: 0.2500 mg | ORAL_TABLET | Freq: Three times a day (TID) | ORAL | 0 refills | Status: DC | PRN

## 2017-08-23 MED ORDER — PREDNISONE 20 MG PO TABS: ORAL_TABLET | ORAL | 0 refills | Status: DC

## 2017-08-23 NOTE — Progress Notes (Addendum)
Patient ID: Maria Gomez MRN: [id number], DOB: 04/14/1946, 72 y.o. Date of Encounter: 08/23/2017,   Chief Complaint: Physical (CPE) 72 y.o.  HPI: 72 y.o. y/o female here for CPE.    07/22/2015: She shows me a picture on her cell phone-- there is a picture of her with a pile of limbs and brush that she has been working on. Says she "works hard." Pt states she has moved into her mother's house to live with her mom. Patient's son has moved into patient's old house. As well, patient's aunt lives next door. 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 Says that the brush pile that is in her picture today is where she takes care of his property.  In addition to this "yardwork" she walks 1 mile a day. States that she has recently [facility] says she really enjoys that.   08/17/2016: Today she reports that she is still living with her mother. Her mom is 67 years old. States that she her mother has to take no medicines but is starting to have some dementia.  Pt still remains very active with keeping up the yard and the house. Says that she had to quit the water aerobics because she did not have time for that when she was having to mowe the yard 5 days a week. She states that she has continued to feel well and has no specific complaints or concerns to address today. Even with physical exertion, she has no angina symptoms. She is taking her thyroid medication daily.   08/23/2017: She discusses that her mom is still living with her.  Her mom is almost 45 year old.  She says that her mom is in physical physically is in good shape but were short short-term memory is very poor and her reasoning is very poor.  Says that she feels stressed and frustrated with dealing with her.  Is that her mom wants to be in charge and wants to do things.  Patient says that as soon as she leaves the house her mom cooks.  That yesterday  as soon as she left the house she came back and her mom had made brownies with caramel sauce on top.  Says that her mom loves sugar.  Patient also says that she had a man come and help split would and that her mom went out there with a wheelbarrow insisting to help load that would even though the man and the patient both said please stop ".  Says that she is very frustrating.  Also says that her mom has been working on taxes for 3 months and has then spread all over the place and she names off multiple tables and floors etc. covered [employer] papers.  That her family member says that they have Xanax 0.25 mg to use when flying on an airplane and when speaking in front of big crowds and thinks that it would help patient to have this available to use as needed when her mom really frustrates and stresses her.  She also reports that she has been having some congestion type symptoms over the last couple weeks.  Says that she had cleaned out the barn and cleaned out some scrap metal and have gotten out some wood and burned also had raked leaves to burn in a barrel.  This "seems like anytime I deal with this stuff I get sick ".  Is that she "was very sick for couple of days "says it seems better but still is not completely resolving.  This is been going on 1-1/2 weeks.  Been using Mucinex DM and Robitussin cough syrup.  She has no other specific concerns to address today. Otherwise she has been feeling well and feeling that other things are stable other than above.  She continues to take thyroid medication daily.  She recently came and had fasting labs drawn on 08/16/17 so we have reviewed those results today.   Review of Systems: Consitutional: No fever, chills, fatigue, night sweats, lymphadenopathy. No significant/unexplained weight changes. Eyes: No visual changes, eye redness, or discharge. ENT/Mouth: No ear pain, sore throat, nasal drainage, or sinus pain. Cardiovascular: No chest pressure,heaviness,  tightness or squeezing, even with exertion. No increased shortness of breath or dyspnea on exertion.No palpitations, edema, orthopnea, PND. Respiratory: No cough, hemoptysis, SOB, or wheezing. Gastrointestinal: No anorexia, dysphagia, reflux, pain, nausea, vomiting, hematemesis, diarrhea, constipation, BRBPR, or melena. Breast: No mass, nodules, bulging, or retraction. No skin changes or inflammation. No nipple discharge. No lymphadenopathy. Genitourinary: No dysuria, hematuria, incontinence, vaginal discharge, pruritis, burning, abnormal bleeding, or pain. Musculoskeletal: No decreased ROM, No joint pain or swelling. No significant pain in neck, back, or extremities. Skin: No rash, pruritis, or concerning lesions. Neurological: No headache, dizziness, syncope, seizures, tremors, memory loss, coordination problems, or paresthesias. Psychological: No anxiety, depression, hallucinations, SI/HI. Endocrine: No polydipsia, polyphagia, polyuria, or known diabetes.No increased fatigue. No palpitations/rapid heart rate. No significant/unexplained weight change. All other systems were reviewed and are otherwise negative.  Past Medical History:  Diagnosis Date  . Hypothyroidism   . Osteopenia      Past Surgical History:  Procedure Laterality Date  . BREAST EXCISIONAL BIOPSY Bilateral     Home Meds:  Current Outpatient Medications on File Prior to Visit  Medication Sig Dispense Refill  . Biotin 1 MG CAPS Take 1 capsule by mouth daily.    . Calcium Carbonate-Vitamin D (CALCIUM + D PO) Take 500 mg by mouth daily.    . cetirizine (ZYRTEC) 10 MG tablet Take 1 tablet (10 mg total) by mouth daily. 30 tablet 2  . Coenzyme Q10 (COQ10) 200 MG CAPS Take 1 capsule by mouth daily.    . Cyanocobalamin ([employer] B-12) 1500 MCG TBDP Take 1 tablet by mouth daily.    . diphenhydrAMINE (BENADRYL) 50 MG tablet Take 50 mg by mouth at bedtime as needed for itching.    . levothyroxine (SYNTHROID, LEVOTHROID) 25 MCG tablet  TAKE 1 TABLET EVERY DAY BEFORE BREAKFAST 90 tablet 2  . Multiple Vitamin (MULTIVITAMIN) tablet Take 1 tablet by mouth daily. Vitality    . OMEGA-3 KRILL OIL PO Take 1 capsule by mouth daily. Reported on 07/22/2015    . omeprazole (PRILOSEC) 20 MG capsule Take 1 capsule (20 mg total) by mouth daily. 30 capsule 2  . Probiotic Product (PROBIOTIC DAILY PO) Take 1 capsule by mouth daily. Florify     No current facility-administered medications on file prior to visit.     Allergies:  Allergies  Allergen Reactions  . Niaspan [Niacin Er] Other (See Comments)    Severe flushing  . Phenazopyridine Hcl Other (See Comments)    Made sick    Social History   Socioeconomic History  . Marital status: Married    Spouse name: Not on file  . Number of children: Not on file  . Years of education: Not on file  . Highest education level: Not  on file  Occupational History  . Not on file  Social Needs  . Financial resource strain: Not on file  . Food insecurity:    Worry: Not on file    Inability: Not on file  . Transportation needs:    Medical: Not on file    Non-medical: Not on file  Tobacco Use  . Smoking status: Never Smoker  . Smokeless tobacco: Never Used  Substance and Sexual Activity  . Alcohol use: No  . Drug use: No  . Sexual activity: Not on file  Lifestyle  . Physical activity:    Days per week: Not on file    Minutes per session: Not on file  . Stress: Not on file  Relationships  . Social connections:    Talks on phone: Not on file    Gets together: Not on file    Attends religious service: Not on file    Active member of club or organization: Not on file    Attends meetings of clubs or organizations: Not on file    Relationship status: Not on file  . Intimate partner violence:    Fear of current or ex partner: Not on file    Emotionally abused: Not on file    Physically abused: Not on file    Forced sexual activity: Not on file  Other Topics Concern  . Not on file    Social History Narrative   Lives with her mom.    Takes care of aunt who lives across the street.    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          Family History  Problem Relation Age of Onset  . Cancer Brother 36       ColoRectal Cancer    Physical Exam: Blood pressure 138/82, pulse 84, temperature 98.5 F (36.9 C), temperature source Oral, resp. rate 16, height 5' 1.5" (1.562 m), weight 69.5 kg (153 lb 3.2 oz), SpO2 97 %., Body mass index is 28.48 kg/m. General: Well developed, well nourished, WF. Appears  in no acute distress. HEENT: Normocephalic, atraumatic. Conjunctiva pink, sclera non-icteric. Pupils 2 mm constricting to 1 mm, round, regular, and equally reactive to light and accomodation. EOMI. Internal auditory canal clear. TMs with good cone of light and without pathology. Nasal mucosa pink. Nares are without discharge. No sinus tenderness. Oral mucosa pink.  Pharynx without exudate.   Neck: Supple. Trachea midline. No thyromegaly. Full ROM. No lymphadenopathy. Radiation of murmur to neck bilaterally. Lungs: Clear to auscultation bilaterally without wheezes, rales, or rhonchi. Breathing is of normal effort and unlabored. Cardiovascular: RRR with S1 S2. III/VI murmur present at apex and at 2nd ICS. Breast: Symmetrical. No masses. Nipples without discharge. Abdomen: Soft, non-tender, non-distended with normoactive bowel sounds. No hepatosplenomegaly or masses. No rebound/guarding. No CVA tenderness. No hernias.  Genitourinary: She refuses pelvic exam today (08/23/2017)  Musculoskeletal: Full range of motion and 5/5 strength throughout.   Skin: Warm and moist.  Neuro: A+Ox3. CN II-XII grossly intact. Moves all extremities spontaneously. Full sensation throughout. Normal gait. Psych:  Responds to questions appropriately with a normal affect.   Assessment/Plan:  72 y.o. y/o female here for CPE 1. Visit for preventive health examination   A.  Screening Labs: 08/23/2017:  She recently came in had fasting labs. We reviewed these results today. All labs are normal. LDL has improved slightly and LDL 123.     B. Pap: Last Pap smear  was performed here 02/27/2011. Cytology was normal. There was no HPV cotesting.  Latest guidelines state that no further pap indicated after age 68, as last pap nml and no h/o abnormality.  She had pelvic exam here 06/2013 which was normal.  Pelvic Exam--07/22/15--Normal.  08/23/2017:  -she defers pelvic exam.  C. Screening Mammogram: She just recently had mammogram 06/27/2014--negative. Repeat one year. At [facility] 07/22/15 she states that she has mammogram scheduled for tomorrow. At [facility] 08/17/2016 she states that she just had mammogram and received letter last week that it was negative. 08/23/2017: She just recently had mammogram 08/10/2017-- negative  D. DEXA/BMD:  At recent physical she had never had a DEXA/BMD. Placed referral for this and she had this performed 06/02/2012.  Showed osteopenia in both sites.  Lumbar spine: T score: -1.7 Left hip: T score: -2.1 See # 5below.--" "5 . Osteopenia She does report that her mother has osteoporosis. Discussed with her her mother has had any fractures. She says her mother has only had an ankle fracture. Says that this occurred after she fell on a wet floor and experienced a hard fall. Patient does not smoke. She has been on no chronic steroid treatment. She is now doing weight-bearing exercise and walking 1 mile daily. She is on calcium and vitamin D. We can wait and monitor this and hold off on starting any prescription medications right now. At [facility] 07/22/2015----Ordered F/U DEXA-- ADDENDUM--DEXA performed 07/30/2015-- T-Score -2.0. No statistically significant change in BMD of Lumbar Spine or Left Hip since 06/02/2012. Pt informed to cont Calcium, Vit D, Weight Bearing Exercise.   08/17/2016--- Continue the calcium, vitamin D, weightbearing exercise. Repeat DEXA not due until  after 07/29/2017 08/23/2017: Today have put an order for a follow-up DEXA scan.  Last DEXA scan was 07/30/2015.  She does mention that her mom has osteoporosis and is on a bisphosphonate once weekly. - DG BONE DENSITY (DXA); Future   E. Colorectal Cancer Screening: She had colonoscopy  performed by Dr. Penelope Coop 09/25/2004. She had another colonoscopy performed 01/11/2014. Found this listed in [facility] but was unable to find the actual report and results to tell when she is supposed to have repeat. 08/23/2017: Last colonoscopy 01/11/2014.  See above.  F. Immunizations:  Influenza: She reports that she has already gotten the influenza vaccine for this season. Tetanus: Up-to-date. Given 04/08/2012 Pneumococcal: Pneumovax was given 02/2011                             Prevnar 13 --given here 06/26/2013  Zostavax:  She has received this 05/20/2012    2. Murmur On exam 08/17/16 murmur is heard. Will obtain echo to further evaluate. Pt voices understanding and agrees. - ECHOCARDIOGRAM COMPLETE; Future 08/23/2017: She still has significant murmur on exam so will get follow-up echocardiogram.  F/U echocardiogram was ordered today. - ECHOCARDIOGRAM COMPLETE; Future  3. Bilateral carotid bruits 07/2016 she had echocardiogram and carotid Dopplers.  Carotid dopplers were performed 09/06/2016 and showed no significant carotid stenosis.  What I am hearing on exam is radiation of her murmur up her neck.  4. Hypothyroidism Recent TSH is within normal limits. Continue current dose.  5 Osteopenia She does report that her mother has osteoporosis. Discussed with her her mother has had any fractures. She says her mother has only had an ankle fracture. Says that this occurred after she fell on a wet floor and experienced a hard fall. Patient does not smoke.  She has been on no chronic steroid treatment. She is now doing weight-bearing exercise and walking 1 mile daily. She is on calcium and vitamin D. We can wait and monitor  this and hold off on starting any prescription medications right now. At [facility] 07/22/2015----Ordered F/U DEXA-- ADDENDUM--DEXA performed 07/30/2015-- T-Score -2.0. No statistically significant change in BMD of Lumbar Spine or Left Hip since 06/02/2012. Pt informed to cont Calcium, Vit D, Weight Bearing Exercise.   08/17/2016--- Continue the calcium, vitamin D, weightbearing exercise. Repeat DEXA not due until after 07/29/2017 08/23/2017: Today have put an order for a follow-up DEXA scan.  Last DEXA scan was 07/30/2015.  She does mention that her mom has osteoporosis and is on a bisphosphonate once weekly. - DG BONE DENSITY (DXA); Future  Acute upper respiratory infection 08/23/2017:   She is to take antibiotic and prednisone as directed.  Follow-up if symptoms do not resolve after completion of these. - azithromycin (ZITHROMAX) 250 MG tablet; Day 1: Take 2 daily.  Days 2 -5: Take 1 daily.  Dispense: 6 tablet; Refill: 0 - predniSONE (DELTASONE) 20 MG tablet; Take 3 daily for 2 days, then 2 daily for 2 days, then 1 daily for 2 days.  Dispense: 12 tablet; Refill: 0  Anxiety 08/23/2017: Today I sent prescription for Xanax 0.25 mg 1 p.o. 3 times daily as needed anxiety #30+0.  She can inform pharmacy when she needs a refill. I also discussed with her having someone else help be a caregiver for her mom.  Gust whether having someone hired is in option.  She says that she thinks that that would offend her mom and that her mom would be be upset with that.  He does think that she needs to talk to her brother to see if they can help may be they could be with her mom 1 day a week to give patient a break.    Subjective:   Patient presents for Medicare Annual/Subsequent preventive examination.   Review Past Medical/Family/Social: These are all documented in Epic and are all reviewed today.  Risk Factors  Current exercise habits:  Walking, Water Aerobics, "Yard Work" for 15 acres!!! Dietary issues discussed: Discussed  decreasing cheese sour cream and casseroles. See note under lipids above.  Cardiac risk factors: Age, Lipids   Depression Screen  (Note: if answer to either of the following is "Yes", a more complete depression screening is indicated)  Over the past two weeks, have you felt down, depressed or hopeless? No Over the past two weeks, have you felt little interest or pleasure in doing things? No Have you lost interest or pleasure in daily life? No Do you often feel hopeless? No Do you cry easily over simple problems? No   Activities of Daily Living  In your present state of health, do you have any difficulty performing the following activities?:  Driving? No  Managing money? No  Feeding yourself? No  Getting from bed to chair? No  Climbing a flight of stairs? No  Preparing food and eating?: No  Bathing or showering? No  Getting dressed: No  Getting to the toilet? No  Using the toilet:No  Moving around from place to place: No  In the past year have you fallen or had a near fall?:No  Are you sexually active? No  Do you have more than one partner? No   Hearing Difficulties: No  Do you often ask people to speak up or repeat themselves? No  Do you experience ringing  or noises in your ears? No Do you have difficulty understanding soft or whispered voices? No  Do you feel that you have a problem with memory? No Do you often misplace items? No  Do you feel safe at home? Yes  Cognitive Testing  Alert? Yes Normal Appearance?Yes  Oriented to person? Yes Place? Yes  Time? Yes  Recall of three objects? Yes  Can perform simple calculations? Yes  Displays appropriate judgment?Yes  Can read the correct time from a watch face?Yes   List the Names of Other Physician/Practitioners you currently use:  GI--In EPic  Indicate any recent Medical Services you may have received from other than [facility] providers in the past year (date may be approximate).   Screening Tests / Date Colonoscopy --all  of this information is listed above.                    Zostavax  Mammogram  Influenza Vaccine  Tetanus/tdap    Assessment:    Annual wellness medicare exam   Plan:    During the course of the visit the patient was educated and counseled about appropriate screening and preventive services ---these are all documented above.   Medicare Attestation  I have personally reviewed:  The patient's medical and social history  Their current medications and supplements  The patient's functional ability including ADLs,fall risks, home safety risks, cognitive, and hearing and visual impairment  Diet and physical activities  The patient's weight, height, BMI, and visual acuity have been recorded in the chart.      Signed, [address] [place], [place], [facility] 08/23/2017 10:05 AM

## 2017-08-23 NOTE — Telephone Encounter (Signed)
Refill appropriate 

## 2017-08-31 ENCOUNTER — Telehealth: Payer: Self-pay

## 2017-08-31 NOTE — Telephone Encounter (Signed)
Patient called and left a message that her ears are stopped up and she feels like she is inside a well. Patient is requesting another round of antibiotics. Pls advise

## 2017-09-01 MED ORDER — LEVOFLOXACIN 750 MG PO TABS: 750.0000 mg | ORAL_TABLET | Freq: Every day | ORAL | 0 refills | Status: DC

## 2017-09-01 NOTE — Telephone Encounter (Signed)
rx sent to pharmacy patient is aware

## 2017-09-01 NOTE — Telephone Encounter (Signed)
At [facility] 08/23/17 I prescribed prednisone and azithromycin. At this point-- have her take---- Levaquin 750 mg 1 p.o. daily x 7 days--- dispense #7+0.   If symptoms do not resolve after that, come in for follow-up visit to reevaluate.

## 2017-09-08 ENCOUNTER — Encounter: Payer: Self-pay | Admitting: Family Medicine

## 2017-09-08 ENCOUNTER — Other Ambulatory Visit: Payer: Self-pay | Admitting: Family Medicine

## 2017-09-08 ENCOUNTER — Ambulatory Visit (INDEPENDENT_AMBULATORY_CARE_PROVIDER_SITE_OTHER): Payer: Medicare HMO | Admitting: Family Medicine

## 2017-09-08 VITALS — BP 130/70 | HR 76 | Temp 98.3°F | Resp 14 | Ht 61.5 in | Wt 155.0 lb

## 2017-09-08 DIAGNOSIS — H6091 Unspecified otitis externa, right ear: Secondary | ICD-10-CM

## 2017-09-08 DIAGNOSIS — R05 Cough: Secondary | ICD-10-CM | POA: Diagnosis not present

## 2017-09-08 DIAGNOSIS — J302 Other seasonal allergic rhinitis: Secondary | ICD-10-CM

## 2017-09-08 DIAGNOSIS — R059 Cough, unspecified: Secondary | ICD-10-CM

## 2017-09-08 DIAGNOSIS — H6981 Other specified disorders of Eustachian tube, right ear: Secondary | ICD-10-CM

## 2017-09-08 DIAGNOSIS — J069 Acute upper respiratory infection, unspecified: Secondary | ICD-10-CM | POA: Diagnosis not present

## 2017-09-08 DIAGNOSIS — H9191 Unspecified hearing loss, right ear: Secondary | ICD-10-CM

## 2017-09-08 MED ORDER — ALBUTEROL SULFATE HFA 108 (90 BASE) MCG/ACT IN AERS: 2.0000 | INHALATION_SPRAY | Freq: Four times a day (QID) | RESPIRATORY_TRACT | 0 refills | Status: DC | PRN

## 2017-09-08 MED ORDER — MOMETASONE FUROATE 50 MCG/ACT NA SUSP: 2.0000 | Freq: Every day | NASAL | 12 refills | Status: DC

## 2017-09-08 MED ORDER — PREDNISONE 20 MG PO TABS: ORAL_TABLET | ORAL | 0 refills | Status: DC

## 2017-09-08 MED ORDER — CIPROFLOXACIN-DEXAMETHASONE 0.3-0.1 % OT SUSP: 4.0000 [drp] | Freq: Two times a day (BID) | OTIC | 0 refills | Status: AC

## 2017-09-08 NOTE — Patient Instructions (Signed)
Take zyrtec and add nasal spray (nasonex) to help with allergies and with your ear (eustachian tube dysfunction). The oral steroids will help with this too, as well as your cough.  I feel you have some reactive airway to the pollen/allergies.  I want you to have an inhaler available to you for when you get coughing fits when going outside.  OR if you get chest tightness again with your cough.    Your right ear looks like you had an outer ear infection and a possible hole in the ear drum.  The drops will treat both of this.    Follow up if you are not feeling much better in about 2 weeks.  If may be gradual improvement between 1-2 weeks.     Eustachian Tube Dysfunction  The eustachian tube connects the middle ear to the back of the nose. It regulates air pressure in the middle ear by allowing air to move between the ear and nose. It also helps to drain fluid from the middle ear space. When the eustachian tube does not function properly, air pressure, fluid, or both can build up in the middle ear. Eustachian tube dysfunction can affect one or both ears. What are the causes? This condition happens when the eustachian tube becomes blocked or cannot open normally. This may result from:  Ear infections.  Colds and other upper respiratory infections.  Allergies.  Irritation, such as from cigarette smoke or acid from the stomach coming up into the esophagus (gastroesophageal reflux).  Sudden changes in air pressure, such as from descending in an airplane.  Abnormal growths in the nose or throat, such as nasal polyps, tumors, or enlarged tissue at the back of the throat (adenoids).  What increases the risk? This condition may be more likely to develop in people who smoke and people who are overweight. Eustachian tube dysfunction may also be more likely to develop in children, especially children who have:  Certain birth defects of the mouth, such as cleft palate.  Large tonsils and  adenoids.  What are the signs or symptoms? Symptoms of this condition may include:  A feeling of fullness in the ear.  Ear pain.  Clicking or popping noises in the ear.  Ringing in the ear.  Hearing loss.  Loss of balance.  Symptoms may get worse when the air pressure around you changes, such as when you travel to an area of high elevation or fly on an airplane. How is this diagnosed? This condition may be diagnosed based on:  Your symptoms.  A physical exam of your ear, nose, and throat.  Tests, such as those that measure: ? The movement of your eardrum (tympanogram). ? Your hearing (audiometry).  How is this treated? Treatment depends on the cause and severity of your condition. If your symptoms are mild, you may be able to relieve your symptoms by moving air into ("popping") your ears. If you have symptoms of fluid in your ears, treatment may include:  Decongestants.  Antihistamines.  Nasal sprays or ear drops that contain medicines that reduce swelling (steroids).  In some cases, you may need to have a procedure to drain the fluid in your eardrum (myringotomy). In this procedure, a small tube is placed in the eardrum to:  Drain the fluid.  Restore the air in the middle ear space.  Follow these instructions at home:  Take over-the-counter and prescription medicines only as told by your health care provider.  Use techniques to help pop your ears  as recommended by your health care provider. These may include: ? Chewing gum. ? Yawning. ? Frequent, forceful swallowing. ? Closing your mouth, holding your nose closed, and gently blowing as if you are trying to blow air out of your nose.  Do not do any of the following until your health care provider approves: ? Travel to high altitudes. ? Fly in airplanes. ? Work in a [employer] scheme manager or room. ? Scuba dive.  Keep your ears dry. Dry your ears completely after showering or bathing.  Do not smoke.  Keep all  follow-up visits as told by your health care provider. This is important. Contact a health care provider if:  Your symptoms do not go away after treatment.  Your symptoms come back after treatment.  You are unable to pop your ears.  You have: ? A fever. ? Pain in your ear. ? Pain in your head or neck. ? Fluid draining from your ear.  Your hearing suddenly changes.  You become very dizzy.  You lose your balance. This information is not intended to replace advice given to you by your health care provider. Make sure you discuss any questions you have with your health care provider. Document Released: 06/07/2015 Document Revised: 10/17/2015 Document Reviewed: 05/30/2014 Elsevier Interactive Patient Education  2018 [employer].    Otitis Externa Otitis externa is an infection of the outer ear canal. The outer ear canal is the area between the outside of the ear and the eardrum. Otitis externa is sometimes called "swimmer's ear." What are the causes? This condition may be caused by:  Swimming in dirty water.  Moisture in the ear.  An injury to the inside of the ear.  An object stuck in the ear.  A cut or scrape on the outside of the ear.  What increases the risk? This condition is more likely to develop in swimmers. What are the signs or symptoms? The first symptom of this condition is often itching in the ear. Later signs and symptoms include:  Swelling of the ear.  Redness in the ear.  Ear pain. The pain may get worse when you pull on your ear.  Pus coming from the ear.  How is this diagnosed? This condition may be diagnosed by examining the ear and testing fluid from the ear for bacteria and funguses. How is this treated? This condition may be treated with:  Antibiotic ear drops. These are often given for 10-14 days.  Medicine to reduce itching and swelling.  Follow these instructions at home:  If you were prescribed antibiotic ear drops, apply them as  told by your health care provider. Do not stop using the antibiotic even if your condition improves.  Take over-the-counter and prescription medicines only as told by your health care provider.  Keep all follow-up visits as told by your health care provider. This is important. How is this prevented?  Keep your ear dry. Use the corner of a towel to dry your ear after you swim or bathe.  Avoid scratching or putting things in your ear. Doing these things can damage the ear canal or remove the protective wax that lines it, which makes it easier for bacteria and funguses to grow.  Avoid swimming in lakes, polluted water, or pools that may not have the right amount of chlorine.  Consider making ear drops and putting 3 or 4 drops in each ear after you swim. Ask your health care provider about how you can make ear drops. Contact a  health care provider if:  You have a fever.  After 3 days your ear is still red, swollen, painful, or draining pus.  Your redness, swelling, or pain gets worse.  You have a severe headache.  You have redness, swelling, pain, or tenderness in the area behind your ear. This information is not intended to replace advice given to you by your health care provider. Make sure you discuss any questions you have with your health care provider. Document Released: 05/11/2005 Document Revised: 06/18/2015 Document Reviewed: 02/18/2015 Elsevier Interactive Patient Education  2018 [place], Adult An allergy is when your body's defense system (immune system) overreacts to an otherwise harmless substance (allergen) that you breathe in or eat or something that touches your skin. When you come into contact with something that you are allergic to, your immune system produces certain proteins (antibodies). These proteins cause cells to release chemicals (histamines) that trigger the symptoms of an allergic reaction. Allergies often affect the nasal passages (allergic  rhinitis), eyes (allergic conjunctivitis), skin (atopic dermatitis), and stomach. Allergies can be mild or severe. Allergies cannot spread from person to person (are not contagious). They can develop at any age and may be outgrown. What increases the risk? You may be at greater risk of allergies if other people in your family have allergies. What are the signs or symptoms? Symptoms depend on what type of allergy you have. They may include:  Runny, stuffy nose.  Sneezing.  Itchy mouth, ears, or throat.  Postnasal drip.  Sore throat.  Itchy, red, watery, or puffy eyes.  Skin rash or hives.  Stomach pain.  Vomiting.  Diarrhea.  Bloating.  Wheezing or coughing.  People with a severe allergy to food, medicine, or an insect bite may have a life-threatening allergic reaction (anaphylaxis). Symptoms of anaphylaxis include:  Hives.  Itching.  Flushed face.  Swollen lips, tongue, or mouth.  Tight or swollen throat.  Chest pain or tightness in the chest.  Trouble breathing or shortness of breath.  Rapid heartbeat.  Dizziness or fainting.  Vomiting.  Diarrhea.  Pain in the abdomen.  How is this diagnosed? This condition is diagnosed based on:  Your symptoms.  Your family and medical history.  A physical exam.  You may need to see a health care provider who specializes in treating allergies (allergist). You may also have tests, including:  Skin tests to see which allergens are causing your symptoms, such as: ? Skin prick test. In this test, your skin is pricked with a tiny needle and exposed to small amounts of possible allergens to see if your skin reacts. ? Intradermal skin test. In this test, a small amount of allergen is injected under your skin to see if your skin reacts. ? Patch test. In this test, a small amount of allergen is placed on your skin and then your skin is covered with a bandage. Your health care provider will check your skin after a couple  of days to see if a rash has developed.  Blood tests.  Challenges tests. In this test, you inhale a small amount of allergen by mouth to see if you have an allergic reaction.  You may also be asked to:  Keep a food diary. A food diary is a record of all the foods and drinks you have in a day and any symptoms you experience.  Practice an elimination diet. An elimination diet involves eliminating specific foods from your diet and then adding them back  in one by one to find out if a certain food causes an allergic reaction.  How is this treated? Treatment for allergies depends on your symptoms. Treatment may include:  Cold compresses to soothe itching and swelling.  Eye drops.  Nasal sprays.  Using a saline spray or container (neti pot) to flush out the nose (nasal irrigation). These methods can help clear away mucus and keep the nasal passages moist.  Using a humidifier.  Oral antihistamines or other medicines to block allergic reaction and inflammation.  Skin creams to treat rashes or itching.  Diet changes to eliminate food allergy triggers.  Repeated exposure to tiny amounts of allergens to build up a tolerance and prevent future allergic reactions (immunotherapy). These include: ? Allergy shots. ? Oral treatment. This involves taking small doses of an allergen under the tongue (sublingual immunotherapy).  Emergency epinephrine injection (auto-injector) in case of an allergic emergency. This is a self-injectable, pre-measured medicine that must be given within the first few minutes of a serious allergic reaction.  Follow these instructions at home:  Avoid known allergens whenever possible.  If you suffer from airborne allergens, wash out your nose daily. You can do this with a saline spray or a neti pot to flush out your nose (nasal irrigation).  Take over-the-counter and prescription medicines only as told by your health care provider.  Keep all follow-up visits as told  by your health care provider. This is important.  If you are at risk of a severe allergic reaction (anaphylaxis), keep your auto-injector with you at all times.  If you have ever had anaphylaxis, wear a medical alert bracelet or necklace that states you have a severe allergy. Contact a health care provider if:  Your symptoms do not improve with treatment. Get help right away if:  You have symptoms of anaphylaxis, such as: ? Swollen mouth, tongue, or throat. ? Pain or tightness in your chest. ? Trouble breathing or shortness of breath. ? Dizziness or fainting. ? Severe abdominal pain, vomiting, or diarrhea. This information is not intended to replace advice given to you by your health care provider. Make sure you discuss any questions you have with your health care provider. Document Released: 08/04/2002 Document Revised: 09/09/2016 Document Reviewed: 11/27/2015 Elsevier Interactive Patient Education  2018 [employer].    Bronchospasm, Adult Bronchospasm is a tightening of the airways going into the lungs. During an episode, it may be harder to breathe. You may cough, and you may make a whistling sound when you breathe (wheeze). This condition often affects people with asthma. What are the causes? This condition is caused by swelling and irritation in the airways. It can be triggered by:  An infection (common).  Seasonal allergies.  An allergic reaction.  Exercise.  Irritants. These include pollution, cigarette smoke, strong odors, aerosol sprays, and paint fumes.  Weather changes. Winds increase molds and pollens in the air. Cold air may cause swelling.  Stress and emotional upset.  What are the signs or symptoms? Symptoms of this condition include:  Wheezing. If the episode was triggered by an allergy, wheezing may start right away or hours later.  Nighttime coughing.  Frequent or severe coughing with a simple cold.  Chest tightness.  Shortness of  breath.  Decreased ability to exercise.  How is this diagnosed? This condition is usually diagnosed with a review of your medical history and a physical exam. Tests, such as lung function tests, are sometimes done to look for other conditions. The need  for a chest X-ray depends on where the wheezing occurs and whether it is the first time you have wheezed. How is this treated? This condition may be treated with:  Inhaled medicines. These open up the airways and help you breathe. They can be taken with an inhaler or a nebulizer device.  Corticosteroid medicines. These may be given for severe bronchospasm, usually when it is associated with asthma.  Avoiding triggers, such as irritants, infection, or allergies.  Follow these instructions at home: Medicines  Take over-the-counter and prescription medicines only as told by your health care provider.  If you need to use an inhaler or nebulizer to take your medicine, ask your health care provider to explain how to use it correctly. If you were given a spacer, always use it with your inhaler. Lifestyle  Reduce the number of triggers in your home. To do this: ? Change your heating and air conditioning filter at least once a month. ? Limit your use of fireplaces and wood stoves. ? Do not smoke. Do not allow smoking in your home. ? Avoid using perfumes and fragrances. ? Get rid of pests, such as roaches and mice, and their droppings. ? Remove any mold from your home. ? Keep your house clean and dust free. Use unscented cleaning products. ? Replace carpet with wood, tile, or vinyl flooring. Carpet can trap dander and dust. ? Use allergy-proof pillows, mattress covers, and box spring covers. ? Wash bed sheets and blankets every week in hot water. Dry them in a dryer. ? Use blankets that are made of polyester or cotton. ? Wash your hands often. ? Do not allow pets in your bedroom.  Avoid breathing in cold air when you exercise. General  instructions  Have a plan for seeking medical care. Know when to call your health care provider and local emergency services, and where to get emergency care.  Stay up to date on your immunizations.  When you have an episode of bronchospasm, stay calm. Try to relax and breathe more slowly.  If you have asthma, make sure you have an asthma action plan.  Keep all follow-up visits as told by your health care provider. This is important. Contact a health care provider if:  You have muscle aches.  You have chest pain.  The mucus that you cough up (sputum) changes from clear or white to yellow, green, gray, or bloody.  You have a fever.  Your sputum gets thicker. Get help right away if:  Your wheezing and coughing get worse, even after you take your prescribed medicines.  It gets even harder to breathe.  You develop severe chest pain. Summary  Bronchospasm is a tightening of the airways going into the lungs.  During an episode of bronchospasm, you may have a harder time breathing. You may cough and make a whistling sound when you breathe (wheeze).  Avoid exposure to triggers such as smoke, dust, mold, animal dander, and fragrances.  When you have an episode of bronchospasm, stay calm. Try to relax and breathe more slowly. This information is not intended to replace advice given to you by your health care provider. Make sure you discuss any questions you have with your health care provider. Document Released: 05/14/2003 Document Revised: 05/07/2016 Document Reviewed: 05/07/2016 Elsevier Interactive Patient Education  2017 [employer].

## 2017-09-08 NOTE — Progress Notes (Signed)
Patient ID: WILLOWDEAN LUHMANN, female    DOB: [birth year], 72 y.o.   MRN: [id number]  PCP: Orlena Sheldon, PA-C  Chief Complaint  Patient presents with  . Right ear still stoped up and popping, cough    Subjective:   IMAGENE BOSS is a 72 y.o. female, presents to clinic with CC of right ear pressure with popping and decreased hearing and dry cough that has been constant for over 2 weeks.  Her symptoms began with changing weather and increased pollen.  She initially had a lot of chest congestion, nonproductive cough, red itchy watery eyes, fever, right ear pain with drainage and she was subsequently seen for a well visit 16 days ago.  At that time she was treated with a Z-Pak and prednisone, also instructed to take Zyrtec which she did not take.  She was not feeling better 8 days later and was then given Levaquin.  She states that she continues to feel fatigued, "cannot do what she normally does."  She believes that the Levaquin is making her feel badly particularly fatigued.  She denies exertional dyspnea, increased sleeping, weight loss.  She does states that she is more tired.  Her dry cough from more than 2 weeks ago has not resolved, unchanged, described as dry cough with intermittent chest tightness.  Cough exacerbated when she goes outside to work, she states it is the pollen.    Her right ear continues to be symptomatic with no change to her symptoms after completing 1 week of Levaquin.  She does not have any more drainage but she continues to feel like her "head is in a well," with popping and clicking with swallowing.   She denies any recent fever, sweats, chills, shortness of breath, chest pain, rash, body aches, near syncope, orthopnea, PND, lower extremity edema.  She has no sick contacts.  She has not been taking Zyrtec and she does not take any other medications for seasonal allergies.  She does note a history of bronchitis, roughly around once a year, which lasts for 1 to 2  months.    She was recently prescribed anxiety medicine for situational anxiety while taking care of her elderly mother, but she has not taken any.  She states that she does not like to take medications if she does not need to, however she does do a lot of vitamins, supplements, probiotics and while taking several antibiotics has been eating a lot of yogurt with cultures.  She denies any abdominal pain, nausea, vomiting, diarrhea.   Patient Active Problem List   Diagnosis Date Noted  . Anxiety 08/23/2017  . Murmur 08/17/2016  . Carotid bruit 08/17/2016  . Constipation 01/06/2016  . Hypothyroidism   . Osteopenia      Prior to Admission medications   Medication Sig Start Date End Date Taking? Authorizing Provider  ALPRAZolam (XANAX) 0.25 MG tablet Take 1 tablet (0.25 mg total) by mouth 3 (three) times daily as needed for anxiety. 08/23/17 08/23/18 Yes Orlena Sheldon, PA-C  Biotin 1 MG CAPS Take 1 capsule by mouth daily.   Yes [provider]  Calcium Carbonate-Vitamin D (CALCIUM + D PO) Take 500 mg by mouth daily.   Yes [provider]  cetirizine (ZYRTEC) 10 MG tablet Take 1 tablet (10 mg total) by mouth daily. Patient taking differently: Take 10 mg by mouth daily as needed.  08/23/17  Yes Orlena Sheldon, PA-C  Coenzyme Q10 (COQ10) 200 MG CAPS Take 1 capsule  by mouth daily.   Yes [provider]  Cyanocobalamin ([facility] B-12) 1500 MCG TBDP Take 1 tablet by mouth daily.   Yes [provider]  levofloxacin (LEVAQUIN) 750 MG tablet Take 1 tablet (750 mg total) by mouth daily. 09/01/17  Yes Dixon, Lonie Peak, PA-C  levothyroxine (SYNTHROID, LEVOTHROID) 25 MCG tablet TAKE 1 TABLET EVERY DAY BEFORE BREAKFAST 08/23/17  Yes [name] B, PA-C  Multiple Vitamin (MULTIVITAMIN) tablet Take 1 tablet by mouth daily. Vitality   Yes [provider]  OMEGA-3 KRILL OIL PO Take 1 capsule by mouth daily. Reported on 07/22/2015   Yes [provider]  Probiotic Product  (PROBIOTIC DAILY PO) Take 1 capsule by mouth daily. Florify   Yes [provider]  albuterol (PROVENTIL HFA;VENTOLIN HFA) 108 (90 Base) MCG/ACT inhaler Inhale 2 puffs into the lungs every 6 (six) hours as needed for wheezing or shortness of breath (chest tightness with wheezy cough). 09/08/17   Delsa Grana, PA-C  ciprofloxacin-dexamethasone (CIPRODEX) OTIC suspension Place 4 drops into the right ear 2 (two) times daily for 7 days. 09/08/17 09/15/17  Delsa Grana, PA-C  mometasone (NASONEX) 50 MCG/ACT nasal spray Place 2 sprays into the nose daily. 09/08/17   Delsa Grana, PA-C  predniSONE (DELTASONE) 20 MG tablet 3 tabs poqday 1-2, 2 tabs poqday 3-4, 1 tab poqday 5-6 09/08/17   Delsa Grana, PA-C     Allergies  Allergen Reactions  . Niaspan [Niacin Er] Other (See Comments)    Severe flushing  . Phenazopyridine Hcl Other (See Comments)    Made sick     Family History  Problem Relation Age of Onset  . Cancer Brother 92       ColoRectal Cancer     Social History   Socioeconomic History  . Marital status: Married    Spouse name: Not on file  . Number of children: Not on file  . Years of education: Not on file  . Highest education level: Not on file  Occupational History  . Not on file  Social Needs  . Financial resource strain: Not on file  . Food insecurity:    Worry: Not on file    Inability: Not on file  . Transportation needs:    Medical: Not on file    Non-medical: Not on file  Tobacco Use  . Smoking status: Never Smoker  . Smokeless tobacco: Never Used  Substance and Sexual Activity  . Alcohol use: No  . Drug use: No  . Sexual activity: Not on file  Lifestyle  . Physical activity:    Days per week: Not on file    Minutes per session: Not on file  . Stress: Not on file  Relationships  . Social connections:    Talks on phone: Not on file    Gets together: Not on file    Attends religious service: Not on file    Active member of club or organization: Not  on file    Attends meetings of clubs or organizations: Not on file    Relationship status: Not on file  . Intimate partner violence:    Fear of current or ex partner: Not on file    Emotionally abused: Not on file    Physically abused: Not on file    Forced sexual activity: Not on file  Other Topics Concern  . Not on file  Social History Narrative   Lives with her mom.    Takes care of aunt who lives across  the street.    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           Review of Systems  Constitutional: Positive for activity change. Negative for appetite change, chills, diaphoresis, fatigue, fever and unexpected weight change.  HENT: Positive for rhinorrhea, sneezing and voice change. Negative for congestion, dental problem, drooling, postnasal drip, sore throat, tinnitus and trouble swallowing.   Eyes: Negative.  Negative for pain, discharge and itching.  Respiratory: Positive for cough and chest tightness. Negative for apnea, choking, shortness of breath, wheezing and stridor.   Cardiovascular: Negative for chest pain, palpitations and leg swelling.  Gastrointestinal: Negative.  Negative for abdominal distention, abdominal pain, anal bleeding, blood in stool, constipation, diarrhea, nausea, rectal pain and vomiting.  Endocrine: Negative.   Genitourinary: Negative.   Musculoskeletal: Negative.   Skin: Negative.  Negative for color change, pallor and rash.  Allergic/Immunologic: Positive for environmental allergies. Negative for food allergies and immunocompromised state.  Neurological: Negative.  Negative for dizziness, syncope, facial asymmetry, weakness, light-headedness, numbness and headaches.  Hematological: Negative.   Psychiatric/Behavioral: Negative.   All other systems reviewed and are negative.      Objective:    Vitals:   09/08/17 1200  BP: 130/70  Pulse: 76  Resp: 14  Temp: 98.3 F (36.8 C)  TempSrc: Oral  SpO2: 99%  Weight: 155  lb (70.3 kg)  Height: 5' 1.5" (1.562 m)      Physical Exam  Constitutional: She is oriented to person, place, and time. Vital signs are normal. She appears well-developed and well-nourished.  Non-toxic appearance. She does not appear ill. No distress.  HENT:  Head: Normocephalic and atraumatic.  Right Ear: External ear normal. No drainage, swelling or tenderness. No mastoid tenderness. Decreased hearing is noted.  Left Ear: Hearing, tympanic membrane, external ear and ear canal normal.  Nose: Nose normal.  Mouth/Throat: Uvula is midline, oropharynx is clear and moist and mucous membranes are normal.  Hearing decreased on the right Right external auditory canal moderately edematous and erythematous with crusting, dried blood on tympanic membrane, translucent, no erythema, no effusion, no perforation visualized  Nasal mucosa bilaterally erythematous and edematous with clear discharge  Posterior oropharynx with injection and mild erythema and edema, no exudate, uvula midline  Mucous membranes moist  No sinus tenderness to palpation  Eyes: Pupils are equal, round, and reactive to light. Conjunctivae, EOM and lids are normal.  Neck: Normal range of motion and phonation normal. Neck supple. No tracheal deviation present.  Cardiovascular: Normal rate, regular rhythm and normal pulses. Exam reveals no gallop and no friction rub.  Murmur heard. Pulses:      Radial pulses are 2+ on the right side, and 2+ on the left side.       Posterior tibial pulses are 2+ on the right side, and 2+ on the left side.  Harsh systolic murmur auscultated over aortic area No LE edema No JVD  Pulmonary/Chest: Effort normal and breath sounds normal. No stridor. No respiratory distress. She has no decreased breath sounds. She has no wheezes. She has no rhonchi. She has no rales. She exhibits no tenderness.  Frequent wheezy cough, no wheeze on exam, no accessory muscle use, no chest wall tenderness to palpation    Abdominal: Soft. Normal appearance and bowel sounds are normal. She exhibits no distension. There is no tenderness. There is no rebound and no guarding.  Musculoskeletal: Normal range of motion. She exhibits no edema  or deformity.  Lymphadenopathy:    She has no cervical adenopathy.  Neurological: She is alert and oriented to person, place, and time. She exhibits normal muscle tone. Gait normal.  Skin: Skin is warm, dry and intact. Capillary refill takes less than 2 seconds. No rash noted. She is not diaphoretic. No pallor.  Psychiatric: She has a normal mood and affect. Her speech is normal and behavior is normal.  Nursing note and vitals reviewed.         Assessment & Plan:      ICD-10-CM   1. Upper respiratory tract infection, unspecified type J06.9 mometasone (NASONEX) 50 MCG/ACT nasal spray  Patient has had persistent symptoms for over 2 weeks, treated with 2 courses of antibiotics, she continues to have erythema and edema and nose and throat, suspect a viral syndrome with baseline allergies  predniSONE (DELTASONE) 20 MG tablet  2. Eustachian tube dysfunction, right H69.81 mometasone (NASONEX) 50 MCG/ACT nasal spray    predniSONE (DELTASONE) 20 MG tablet  3. Otitis externa of right ear, unspecified chronicity, unspecified type H60.91 ciprofloxacin-dexamethasone (CIPRODEX) OTIC suspension  4. Cough Bronchitis vs reactive airway vs persistent URI?  Triggered by pollen - see management of allergies below. Lungs CTA A&P, no CXR indicated.  R05 predniSONE (DELTASONE) 20 MG tablet    albuterol (PROVENTIL HFA;VENTOLIN HFA) 108 (90 Base) MCG/ACT inhaler  5. Seasonal allergies J30.2 mometasone (NASONEX) 50 MCG/ACT nasal spray       Ear complaints - hearing screened.   Hearing Screening  Edited by: Jannifer Franklin, [place] B, RMA   125hz  250hz  500hz  1000hz  2000hz  3000hz  4000hz  6000hz  8000hz   Right ear   25 40 40  40    Left ear   20 40 20  20       Decreased hearing on right.  Unclear if  patient had an otitis externa infection or a ruptured TM several weeks ago when she had ear drainage and the onset of her symptoms.  I see dried blood on the tympanic membrane but do not see any current perforation.  With decreased hearing but is possible that she did have a perfect eardrum that is healing, with residual symptoms.  Patient may have also simply had otitis externa which does appear to be in a healing process at this time.  She does have a lot of erythema and edema in her nose and throat in her right ear symptoms may eustachian tube dysfunction secondary to URI.  Because she does continue to have some edema in erythema and crusting of her right external auditory canal will treat with Ciprodex drops which are safe for middle ear in case of recent perforation.  Steroid nasal spray and antihistamines to treat her seasonal allergies and eustachian tube dysfunction.  Steroid taper and albuterol to help with seems to be a bronchospasm/reactive airway, when she goes outside and encountered pollen.  Discussed the patient that hopefully all of these things will improve her symptoms 1-2 weeks from now.  He will return if not improved.  Particularly if her hearing and ear symptoms are not resolved, patient may require ENT referral.  Patient did verbalize understanding of plan which was printed for her, highlighted and reviewed.  Advised to return sooner if feeling acutely worse with fever chills sweats or fatigue.  I do hear a systolic murmur over the aortic area patient has had a echo last year and is currently pending further testing which was ordered at her recent annual physical exam.  She has no  concerning signs or symptoms of valvular disease or congestive heart failure.   Delsa Grana, PA-C 09/08/17 1:04 PM

## 2017-09-09 ENCOUNTER — Telehealth: Payer: Self-pay

## 2017-09-09 ENCOUNTER — Other Ambulatory Visit: Payer: Self-pay | Admitting: Family Medicine

## 2017-09-09 MED ORDER — CIPROFLOXACIN HCL 0.3 % OP SOLN: 2.0000 [drp] | Freq: Two times a day (BID) | OPHTHALMIC | 0 refills | Status: AC

## 2017-09-09 NOTE — Progress Notes (Signed)
Ear drops sent to alternative pharmacy per pt request due to cost

## 2017-09-09 NOTE — Telephone Encounter (Signed)
Call placed to patient she is aware of provider recommendations  

## 2017-09-09 NOTE — Telephone Encounter (Signed)
Patient called left message indicating that Ciprodex was $150 and would like to know if something cheaper can be called in. Pls advise

## 2017-09-09 NOTE — Telephone Encounter (Signed)
Ciprofloxacin ophthalmic drops sent to [facility] [address] pharmacy, per pt request.    Pt can look at [facility] for coupons to help with cost.

## 2017-09-20 ENCOUNTER — Other Ambulatory Visit: Payer: Self-pay

## 2017-09-20 ENCOUNTER — Ambulatory Visit (HOSPITAL_COMMUNITY): Payer: Medicare HMO | Attending: Cardiovascular Disease

## 2017-09-20 DIAGNOSIS — R011 Cardiac murmur, unspecified: Secondary | ICD-10-CM

## 2017-09-20 DIAGNOSIS — J069 Acute upper respiratory infection, unspecified: Secondary | ICD-10-CM | POA: Insufficient documentation

## 2017-09-20 DIAGNOSIS — E039 Hypothyroidism, unspecified: Secondary | ICD-10-CM | POA: Diagnosis not present

## 2017-09-21 ENCOUNTER — Other Ambulatory Visit: Payer: Self-pay

## 2017-09-22 ENCOUNTER — Ambulatory Visit
Admission: RE | Admit: 2017-09-22 | Discharge: 2017-09-22 | Disposition: A | Discharge status: Home / Self Care | Payer: Medicare HMO | Source: Ambulatory Visit | Attending: Physician Assistant | Admitting: Physician Assistant

## 2017-09-22 DIAGNOSIS — M8580 Other specified disorders of bone density and structure, unspecified site: Secondary | ICD-10-CM

## 2017-09-22 DIAGNOSIS — Z0000 Encounter for general adult medical examination without abnormal findings: Secondary | ICD-10-CM

## 2017-09-22 DIAGNOSIS — M85852 Other specified disorders of bone density and structure, left thigh: Secondary | ICD-10-CM | POA: Diagnosis not present

## 2017-09-22 DIAGNOSIS — M810 Age-related osteoporosis without current pathological fracture: Secondary | ICD-10-CM | POA: Diagnosis not present

## 2017-09-22 DIAGNOSIS — Z780 Asymptomatic menopausal state: Secondary | ICD-10-CM | POA: Diagnosis not present

## 2017-09-28 ENCOUNTER — Other Ambulatory Visit: Payer: Self-pay

## 2017-09-28 DIAGNOSIS — I340 Nonrheumatic mitral (valve) insufficiency: Secondary | ICD-10-CM

## 2017-09-28 DIAGNOSIS — M810 Age-related osteoporosis without current pathological fracture: Secondary | ICD-10-CM | POA: Insufficient documentation

## 2017-09-28 MED ORDER — ALENDRONATE SODIUM 70 MG PO TABS: 70.0000 mg | ORAL_TABLET | ORAL | 3 refills | Status: DC

## 2017-09-28 NOTE — Telephone Encounter (Signed)
rx called into pharmacy

## 2017-11-03 ENCOUNTER — Encounter: Payer: Self-pay | Admitting: Physician Assistant

## 2017-11-03 ENCOUNTER — Ambulatory Visit: Payer: Medicare HMO | Admitting: Physician Assistant

## 2017-11-03 VITALS — BP 117/78 | HR 62 | Ht 61.0 in | Wt 159.8 lb

## 2017-11-03 DIAGNOSIS — I340 Nonrheumatic mitral (valve) insufficiency: Secondary | ICD-10-CM | POA: Diagnosis not present

## 2017-11-03 DIAGNOSIS — Z01818 Encounter for other preprocedural examination: Secondary | ICD-10-CM | POA: Diagnosis not present

## 2017-11-03 DIAGNOSIS — E039 Hypothyroidism, unspecified: Secondary | ICD-10-CM

## 2017-11-03 DIAGNOSIS — E785 Hyperlipidemia, unspecified: Secondary | ICD-10-CM

## 2017-11-03 LAB — CBC
Hematocrit: 43.3 % (ref 34.0–46.6)
Hemoglobin: 14.5 g/dL (ref 11.1–15.9)
MCH: 30.9 pg (ref 26.6–33.0)
MCHC: 33.5 g/dL (ref 31.5–35.7)
MCV: 92 fL (ref 79–97)
PLATELETS: 268 10*3/uL (ref 150–450)
RBC: 4.7 x10E6/uL (ref 3.77–5.28)
RDW: 13.9 % (ref 12.3–15.4)
WBC: 7.2 10*3/uL (ref 3.4–10.8)

## 2017-11-03 LAB — BASIC METABOLIC PANEL
BUN/Creatinine Ratio: 25 (ref 12–28)
BUN: 24 mg/dL (ref 8–27)
CO2: 24 mmol/L (ref 20–29)
CREATININE: 0.97 mg/dL (ref 0.57–1.00)
Calcium: 9.7 mg/dL (ref 8.7–10.3)
Chloride: 100 mmol/L (ref 96–106)
GFR calc Af Amer: 68 mL/min/{1.73_m2} (ref >59)
GFR, EST NON AFRICAN AMERICAN: 59 mL/min/{1.73_m2} — AB (ref >59)
Glucose: 85 mg/dL (ref 65–99)
Potassium: 5.2 mmol/L (ref 3.5–5.2)
Sodium: 138 mmol/L (ref 134–144)

## 2017-11-03 NOTE — Progress Notes (Signed)
Cardiology Office Note    Date:  11/03/2017   ID:  Maria, Gomez 07-18-1945, MRN [id number]  PCP:  Maria Sheldon, PA-C  Cardiologist:  New - case discussed with Dr. Martinique  Chief Complaint  Patient presents with  . New Patient (Initial Visit)    case discussed with Dr. Martinique, referred by [name] PA-C for mitral regurgitation     History of Present Illness:  Maria Gomez is a 72 y.o. female with PMH of hypothyroidism and HLD but no prior cardiac history.  On the previous office visit with his primary care provider in March 2018, a heart murmur was heard.  Echocardiogram obtained on 09/01/2016 showed EF 60 to 65%, mild LVH with moderate focal basal septal hypertrophy, grade 1 DD, thickened mitral leaflet with posterior leaflet prolapse, trace to mild MR, moderate LAE, PA peak pressure was 21 mmHg.  Carotid Doppler obtained on 09/04/2016 showed no significant disease bilaterally.  Echocardiogram was repeated on 09/20/2017, this showed EF 35 to 60%, bileaflet prolapse of mitral valve with at least moderate to severe MR directed anteriorly into the left atrium, PA peak pressure 35 mmHg.  Despite her age, she is living with her 54 year old mother in the capable of doing everything by herself.  In the past year, she did notice gradual worsening fatigue, however no significant shortness of breath or chest discomfort with exertion.  She is still mowing her lawn and able to do all the household work in the worst.  She denies any significant dizziness, lower extremity edema, orthopnea or PND.  I discussed case with Dr. Martinique, we we will set the patient up for outpatient TEE to further assess the mitral valve.   Past Medical History:  Diagnosis Date  . Hypothyroidism   . Osteopenia     Past Surgical History:  Procedure Laterality Date  . BREAST EXCISIONAL BIOPSY Bilateral     Current Medications: Outpatient Medications Prior to Visit  Medication Sig Dispense Refill  .  alendronate (FOSAMAX) 70 MG tablet Take 1 tablet (70 mg total) by mouth every 7 (seven) days. Take with a full glass of water on an empty stomach.Sit upright for 30 minutes after taking 12 tablet 3  . ALPRAZolam (XANAX) 0.25 MG tablet Take 1 tablet (0.25 mg total) by mouth 3 (three) times daily as needed for anxiety. 30 tablet 0  . Biotin 1 MG CAPS Take 1 capsule by mouth daily.    . cetirizine (ZYRTEC) 10 MG tablet Take 1 tablet (10 mg total) by mouth daily. (Patient taking differently: Take 10 mg by mouth daily as needed. ) 90 tablet 0  . Coenzyme Q10 (COQ10) 200 MG CAPS Take 1 capsule by mouth daily.    . Cyanocobalamin ([employer] B-12) 1500 MCG TBDP Take 1 tablet by mouth daily.    Marland Kitchen levothyroxine (SYNTHROID, LEVOTHROID) 25 MCG tablet TAKE 1 TABLET EVERY DAY BEFORE BREAKFAST 90 tablet 2  . Magnesium 500 MG CAPS Take 1 capsule by mouth every morning.    . Multiple Vitamin (MULTIVITAMIN) tablet Take 1 tablet by mouth daily. Vitality    . OMEGA-3 KRILL OIL PO Take 1 capsule by mouth daily. Reported on 07/22/2015    . Probiotic Product (PROBIOTIC DAILY PO) Take 1 capsule by mouth daily. Florify    . albuterol (PROVENTIL HFA;VENTOLIN HFA) 108 (90 Base) MCG/ACT inhaler Inhale 2 puffs into the lungs every 6 (six) hours as needed for wheezing or shortness of breath (chest tightness with wheezy cough).  1 Inhaler 0  . Calcium Carbonate-Vitamin D (CALCIUM + D PO) Take 500 mg by mouth daily.    Marland Kitchen levofloxacin (LEVAQUIN) 750 MG tablet Take 1 tablet (750 mg total) by mouth daily. 7 tablet 0  . mometasone (NASONEX) 50 MCG/ACT nasal spray Place 2 sprays into the nose daily. 17 g 12  . predniSONE (DELTASONE) 20 MG tablet 3 tabs poqday 1-2, 2 tabs poqday 3-4, 1 tab poqday 5-6 12 tablet 0   No facility-administered medications prior to visit.      Allergies:   Niaspan [niacin er]; Phenazopyridine hcl; and Statins   Social History   Socioeconomic History  . Marital status: Married    Spouse name: Not on file  .  Number of children: Not on file  . Years of education: Not on file  . Highest education level: Not on file  Occupational History  . Not on file  Social Needs  . Financial resource strain: Not on file  . Food insecurity:    Worry: Not on file    Inability: Not on file  . Transportation needs:    Medical: Not on file    Non-medical: Not on file  Tobacco Use  . Smoking status: Never Smoker  . Smokeless tobacco: Never Used  Substance and Sexual Activity  . Alcohol use: No  . Drug use: No  . Sexual activity: Not on file  Lifestyle  . Physical activity:    Days per week: Not on file    Minutes per session: Not on file  . Stress: Not on file  Relationships  . Social connections:    Talks on phone: Not on file    Gets together: Not on file    Attends religious service: Not on file    Active member of club or organization: Not on file    Attends meetings of clubs or organizations: Not on file    Relationship status: Not on file  Other Topics Concern  . Not on file  Social History Narrative   Lives with her mom.    Takes care of aunt who lives across the street.    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           Family History:  The patient's family history includes Cancer (age of onset: 63) in her brother; Cancer (age of onset: 81) in her maternal grandmother; Heart disease in her father; Stroke in her paternal grandfather; Stroke (age of onset: 38) in her maternal grandfather.   ROS:   Please see the history of present illness.    ROS All other systems reviewed and are negative.   PHYSICAL EXAM:   VS:  BP 117/78 (BP Location: Right Arm)   Pulse 62   Ht 5\' 1"  (1.549 m)   Wt 159 lb 12.8 oz (72.5 kg)   BMI 30.19 kg/m    GEN: Well nourished, well developed, in no acute distress  HEENT: normal  Neck: no JVD, carotid bruits, or masses Cardiac: RRR; no rubs, or gallops,no edema  2/6 systolic murmur near apex Respiratory:  clear to auscultation  bilaterally, normal work of breathing GI: soft, nontender, nondistended, + BS MS: no deformity or atrophy  Skin: warm and dry, no rash Neuro:  Alert and Oriented x 3, Strength and sensation are intact Psych: euthymic mood, full affect  Wt Readings from Last 3 Encounters:  11/03/17 159 lb 12.8 oz (72.5 kg)  09/08/17 155 lb (70.3  kg)  08/23/17 153 lb 3.2 oz (69.5 kg)      Studies/Labs Reviewed:   EKG:  EKG is ordered today.  The ekg ordered today demonstrates normal sinus rhythm, no significant ST-T wave changes  Recent Labs: 08/16/2017: ALT 12; BUN 15; Creat 0.95; Hemoglobin 13.8; Platelets 240; Potassium 4.7; Sodium 140; TSH 2.32   Lipid Panel    Component Value Date/Time   CHOL 199 08/16/2017 0907   TRIG 193 (H) 08/16/2017 0907   HDL 45 (L) 08/16/2017 0907   CHOLHDL 4.4 08/16/2017 0907   VLDL 31 (H) 08/11/2016 0857   LDLCALC 123 (H) 08/16/2017 0907    Additional studies/ records that were reviewed today include:   Echo 09/01/2016 LV EF: 60% -   65% Study Conclusions  - Left ventricle: The cavity size was normal. Wall thickness was   increased in a pattern of mild LVH. There was moderate focal   basal hypertrophy of the septum. Systolic function was normal.   The estimated ejection fraction was in the range of 60% to 65%.   Wall motion was normal; there were no regional wall motion   abnormalities. Doppler parameters are consistent with abnormal   left ventricular relaxation (grade 1 diastolic dysfunction). The   E/e&' ratio is >15, suggesting elevated LV filling pressure. - Aortic valve: Trileaflet. Sclerosis without stenosis. There was   no regurgitation. - Mitral valve: Thickened leaflets with posterior leaflet prolapse.   Trace to mild regurgitation. - Left atrium: The atrium was moderately dilated. - Tricuspid valve: There was trivial regurgitation. - Pulmonary arteries: PA peak pressure: 21 mm Hg (S). - Inferior vena cava: The vessel was normal in size. The    respirophasic diameter changes were in the normal range (>= 50%),   consistent with normal central venous pressure.  Impressions:  - LVEF 60-65%, mild LVH with moderate focal basal septal   hypertrophy, normal wall motion, grade 1 DD with elevated LV   filling pressure, aortic valve sclerosis, posterior mitral valve   leaflet prolapse with mild to moderate anteriorly directed mitral   regurgitation, moderate LAE, trivial TR, RVSP 21 mmHg, normal   IVC.   Echo 09/20/2017 LV EF: 55% -   60% Study Conclusions  - Left ventricle: The cavity size was normal. Wall thickness was   increased in a pattern of mild LVH. Systolic function was normal.   The estimated ejection fraction was in the range of 55% to 60%. - Mitral valve: There is bileaflet prolapse of the MV MR is   directed anteirorly into the left atrium IT is at least   moderately severe . - Left atrium: The atrium was mildly dilated. - Pulmonary arteries: PA peak pressure: 35 mm Hg (S).  ASSESSMENT:    1. Mitral valve insufficiency, unspecified etiology   2. Pre-op testing   3. Hyperlipidemia, unspecified hyperlipidemia type   4. Hypothyroidism, unspecified type      PLAN:  In order of problems listed above:  1. Mitral valve regurgitation: Echocardiogram obtained recently showed worsening mitral valve regurgitation with bileaflet prolapse compared to previous echocardiogram in 2018.  Patient does complain of gradual worsening fatigue, however denies any worsening chest discomfort with exertion or shortness of breath.  She appears to be euvolemic on physical exam.  After discussing with DOD Dr. Martinique, we recommend to proceed with TEE.  It is unclear to me why the degree of mitral regurgitation has worsened so significantly compared to a year ago, it is possible previous echocardiogram  was underestimating the true mitral regurgitation, or recent echocardiogram overestimating the degree of mitral regurgitation.  For some of  her age, she is fairly independent and also takes care of her 75 year old mother.  -Benefit and risk of transesophageal echocardiogram has been discussed with the patient, including but not limited to reaction to the sedation such as hypotension, bradycardia and aspiration event or trauma as result of transesophageal probe including esophageal bleeding, hematoma or esophageal rupture.  Overall risk 1 in 10,000 cases.  Patient understands the risk and benefits of the procedure and agreed to proceed.  2. Hyperlipidemia: She is currently not on any statin medication.  Recent lipid panel showed elevated LDL.  Given her age, I would recommend increased diet and exercise, if cannot improve, may need to consider statin  3. Hypothyroidism: Managed by primary care provider    Medication Adjustments/Labs and Tests Ordered: Current medicines are reviewed at length with the patient today.  Concerns regarding medicines are outlined above.  Medication changes, Labs and Tests ordered today are listed in the Patient Instructions below. Patient Instructions  Dear Ms. Drue Flirt, You are scheduled for a TEE on                      with Dr.                    .  Please arrive at the [facility] (Main Entrance A) at [facility]: [place], [place] [address] at                am/pm. (1 hour prior to procedure unless lab work is needed; if lab work is needed arrive 1.5 hours ahead)  DIET: Nothing to eat or drink after midnight except a sip of water with medications (see medication instructions below)  Medication Instructions: Do not need to hold any medications   Labs: Please have your labs drawn within one week of the scheduled TEE.  You must have a responsible person to drive you home and stay in the waiting area during your procedure. Failure to do so could result in cancellation.  Bring your insurance cards.  *Special Note: Every effort is made to have your procedure done on time.  Occasionally there are emergencies that occur at the hospital that may cause delays. Please be patient if a delay does occur.   Your physician recommends that you schedule a follow-up appointment in: 2 months with Dr. Martinique     Signed, [place], [place]  11/03/2017 1:02 PM    [facility] [place], [place], [place]  [address] Phone: [phone]; Fax: [phone]

## 2017-11-03 NOTE — Patient Instructions (Addendum)
Dear Ms. Drue Flirt, You are scheduled for a TEE on                      with Dr.                    .  Please arrive at the [facility] (Main Entrance A) at [facility]: [place], [place] [address] at                am/pm. (1 hour prior to procedure unless lab work is needed; if lab work is needed arrive 1.5 hours ahead)  DIET: Nothing to eat or drink after midnight except a sip of water with medications (see medication instructions below)  Medication Instructions: Do not need to hold any medications   Labs: Please have your labs drawn within one week of the scheduled TEE.  You must have a responsible person to drive you home and stay in the waiting area during your procedure. Failure to do so could result in cancellation.  Bring your insurance cards.  *Special Note: Every effort is made to have your procedure done on time. Occasionally there are emergencies that occur at the hospital that may cause delays. Please be patient if a delay does occur.   Your physician recommends that you schedule a follow-up appointment in: 2 months with Dr. Martinique

## 2017-11-08 ENCOUNTER — Other Ambulatory Visit: Payer: Self-pay | Admitting: Physician Assistant

## 2017-11-09 ENCOUNTER — Encounter (HOSPITAL_COMMUNITY): Payer: Self-pay | Admitting: *Deleted

## 2017-11-09 ENCOUNTER — Ambulatory Visit (HOSPITAL_BASED_OUTPATIENT_CLINIC_OR_DEPARTMENT_OTHER): Payer: Medicare HMO

## 2017-11-09 ENCOUNTER — Ambulatory Visit (HOSPITAL_COMMUNITY)
Admission: RE | Admit: 2017-11-09 | Discharge: 2017-11-09 | Disposition: A | Discharge status: Home / Self Care | Payer: Medicare HMO | Source: Ambulatory Visit | Attending: Cardiology | Admitting: Cardiology

## 2017-11-09 ENCOUNTER — Encounter (HOSPITAL_COMMUNITY)
Admission: RE | Disposition: A | Discharge status: Home / Self Care | Payer: Self-pay | Source: Ambulatory Visit | Attending: Cardiology

## 2017-11-09 ENCOUNTER — Other Ambulatory Visit: Payer: Self-pay

## 2017-11-09 DIAGNOSIS — I340 Nonrheumatic mitral (valve) insufficiency: Secondary | ICD-10-CM | POA: Insufficient documentation

## 2017-11-09 DIAGNOSIS — I341 Nonrheumatic mitral (valve) prolapse: Secondary | ICD-10-CM | POA: Diagnosis not present

## 2017-11-09 HISTORY — PX: TEE WITHOUT CARDIOVERSION: SHX5443

## 2017-11-09 SURGERY — ECHOCARDIOGRAM, TRANSESOPHAGEAL
Anesthesia: Moderate Sedation

## 2017-11-09 MED ORDER — BUTAMBEN-TETRACAINE-BENZOCAINE 2-2-14 % EX AERO
INHALATION_SPRAY | CUTANEOUS | Status: DC | PRN
  Administered 2017-11-09: 2 via TOPICAL

## 2017-11-09 MED ORDER — MIDAZOLAM HCL 10 MG/2ML IJ SOLN
INTRAMUSCULAR | Status: DC | PRN
  Administered 2017-11-09 (×2): 2 mg via INTRAVENOUS

## 2017-11-09 MED ORDER — FENTANYL CITRATE (PF) 100 MCG/2ML IJ SOLN
INTRAMUSCULAR | Status: DC | PRN
  Administered 2017-11-09: 25 ug via INTRAVENOUS

## 2017-11-09 MED ORDER — MIDAZOLAM HCL 5 MG/ML IJ SOLN
INTRAMUSCULAR | Status: AC
  Filled 2017-11-09: qty 2

## 2017-11-09 MED ORDER — FENTANYL CITRATE (PF) 100 MCG/2ML IJ SOLN
INTRAMUSCULAR | Status: AC
  Filled 2017-11-09: qty 2

## 2017-11-09 MED ORDER — SODIUM CHLORIDE BACTERIOSTATIC 0.9 % IJ SOLN
INTRAMUSCULAR | Status: DC | PRN
  Administered 2017-11-09: 9 mL via INTRAVENOUS

## 2017-11-09 MED ORDER — SODIUM CHLORIDE 0.9 % IV SOLN
INTRAVENOUS | Status: DC
  Administered 2017-11-09: 500 mL via INTRAVENOUS

## 2017-11-09 NOTE — Addendum Note (Signed)
Addended by: Waylan Rocher on: 11/09/2017 09:41 AM   Modules accepted: Orders

## 2017-11-09 NOTE — Discharge Instructions (Signed)

## 2017-11-09 NOTE — Progress Notes (Signed)
  Echocardiogram Echocardiogram Transesophageal has been performed.  Jannett Celestine 11/09/2017, 11:08 AM

## 2017-11-09 NOTE — CV Procedure (Signed)
Procedure: TEE  Sedation: Versed 4 mg IV, Fentanyl 25 mcg IV  Indication: Mitral regurgitation.   Findings: Please see echo section for full report.  Normal LV size with mild LV hypertrophy.  EF 60%.  Normal RV size and systolic function.  Mild left atrial enlargement, no LA appendage thrombus.  Normal right atrium.  No ASD/PFO by color doppler.  Trivial tricuspid regurgitation, peak RV-RA gradient 18 mmHg.  Trileaflet aortic valve with no stenosis, trivial regurgitation.  There was bileaflet prolapse of the mitral valve.  There was partial flail of the P2 scallop of the posterior leaflet with highly eccentric, anteriorly-directed mitral regurgitation.  The regurgitation appeared severe, unable to do PISA calculation because the jet was so eccentric.  There was not systolic flow reversal in the pulmonary vein doppler pattern. Normal caliber aorta with minimal plaque.  Impression: Severe mitral regurgitation in setting of bileaflet MVP and partial flail P2 scallop.   Maria Gomez 11/09/2017 11:04 AM

## 2017-11-10 ENCOUNTER — Encounter (HOSPITAL_COMMUNITY): Payer: Self-pay | Admitting: Cardiology

## 2017-11-11 ENCOUNTER — Telehealth: Payer: Self-pay

## 2017-11-11 NOTE — Telephone Encounter (Signed)
-----   Message from [place], [place] sent at 11/11/2017 10:27 AM EDT ----- Darden Dates, this patient need sooner followup. Discussed with Dr. Martinique, either see Dr. Martinique on 7/15 at 4:40PM or me on a day Dr. Martinique is also in the clinic.  Thanks  Signed, Almyra Deforest PA Pager: [phone]  ----- Message ----- From: Martinique, Peter M, MD Sent: 11/11/2017   8:55 AM To: Almyra Deforest, PA  Yes I do think she needs to be seen sooner. Will need to discuss possible MV repair. Options would be to add her on to my schedule July 15 at 4:40 pm or if there is a day you could see her when I am in the office and could see her with you - that would be great.  Collier Salina   ----- Message ----- From: Almyra Deforest, [place] Sent: 11/10/2017   9:38 AM To: Peter M Martinique, MD  Please review the TEE report, mitral regurgitation appears to be severe. Her next appt with you is 2 month out, should I bring the patient back earlier?

## 2017-11-11 NOTE — Telephone Encounter (Signed)
Patient notified and appointment scheduled. 

## 2017-12-05 NOTE — Progress Notes (Signed)
Cardiology Office Note    Date:  12/06/2017   ID:  Maria, Gomez 1945/12/13, MRN [id number]  PCP:  Rennis Golden  Cardiologist:  Dr. Martinique  Chief Complaint  Patient presents with  . Heart Murmur    History of Present Illness:  Maria Gomez is a 72 y.o. female seen for followup Mitral insufficiency. She was intially evaluated by Almyra Deforest PA-C on 11/03/17 for evaluation of a heart murmur. She has a  PMH of hypothyroidism and HLD but no prior cardiac history.  On the previous office visit with his primary care provider in March 2018, a heart murmur was heard.  Echocardiogram obtained on 09/01/2016 showed EF 60 to 65%, mild LVH with moderate focal basal septal hypertrophy, grade 1 DD, thickened mitral leaflet with posterior leaflet prolapse, mild MR, moderate LAE, PA peak pressure was 21 mmHg.  Carotid Doppler obtained on 09/04/2016 showed no significant disease bilaterally.  Echocardiogram was repeated on 09/20/2017, this showed EF 55 to 60%, bileaflet prolapse of mitral valve with at least moderate to severe MR directed anteriorly into the left atrium, PA peak pressure 35 mmHg. She subsequently underwent TEE showing bileaflet MVP with a flail P2 segment and severe MR. She is seen today to discuss.   She notes that she has felt well this year until April. She was cleaning out an old barn with a lot of old leaf mold. Following this she felt ill and had a fever and some upper respiratory symptoms. This improved but she continued to feel more fatigued than usual. She has noted more palpitations- more a sensation that her heart is beating hard. Otherwise she has been in excellent health. She cares for her 23 yo mother and does a lot of yard work with mowing and tree/bush trimming. Denies chest pain or edema.    Past Medical History:  Diagnosis Date  . Hypothyroidism   . Osteopenia     Past Surgical History:  Procedure Laterality Date  . BREAST EXCISIONAL BIOPSY Bilateral     . TEE WITHOUT CARDIOVERSION N/A 11/09/2017   Procedure: TRANSESOPHAGEAL ECHOCARDIOGRAM (TEE);  Surgeon: Larey Dresser, MD;  Location: [facility] ENDOSCOPY;  Service: Cardiovascular;  Laterality: N/A;    Current Medications: Outpatient Medications Prior to Visit  Medication Sig Dispense Refill  . alendronate (FOSAMAX) 70 MG tablet Take 1 tablet (70 mg total) by mouth every 7 (seven) days. Take with a full glass of water on an empty stomach.Sit upright for 30 minutes after taking 12 tablet 3  . Biotin 1 MG CAPS Take 1 capsule by mouth daily.    . cetirizine (ZYRTEC) 10 MG tablet Take 1 tablet (10 mg total) by mouth daily. (Patient taking differently: Take 10 mg by mouth at bedtime. ) 90 tablet 0  . Coenzyme Q10 (COQ10) 200 MG CAPS Take 1 capsule by mouth daily.    . Cyanocobalamin ([employer] B-12) 1500 MCG TBDP Take 1 tablet by mouth daily.    Marland Kitchen levothyroxine (SYNTHROID, LEVOTHROID) 25 MCG tablet TAKE 1 TABLET EVERY DAY BEFORE BREAKFAST 90 tablet 2  . Magnesium 500 MG CAPS Take 1 capsule by mouth every morning.    . Multiple Vitamin (MULTIVITAMIN) tablet Take 1 tablet by mouth daily. Centrum Silver    . OMEGA-3 KRILL OIL PO Take 1 capsule by mouth daily. Reported on 07/22/2015    . Probiotic Product (PROBIOTIC DAILY PO) Take 1 capsule by mouth daily. Florify    . Propylene Glycol (SYSTANE COMPLETE) 0.6 %  SOLN Place 1 drop into both eyes daily as needed (dry eyes).    . zinc gluconate 50 MG tablet Take 50 mg by mouth daily.    Marland Kitchen ALPRAZolam (XANAX) 0.25 MG tablet Take 1 tablet (0.25 mg total) by mouth 3 (three) times daily as needed for anxiety. (Patient not taking: Reported on 12/06/2017) 30 tablet 0   No facility-administered medications prior to visit.      Allergies:   Niaspan [niacin er]; Phenazopyridine hcl; and Statins   Social History   Socioeconomic History  . Marital status: Widowed    Spouse name: Not on file  . Number of children: Not on file  . Years of education: Not on file  . Highest  education level: Not on file  Occupational History  . Not on file  Social Needs  . Financial resource strain: Not on file  . Food insecurity:    Worry: Not on file    Inability: Not on file  . Transportation needs:    Medical: Not on file    Non-medical: Not on file  Tobacco Use  . Smoking status: Never Smoker  . Smokeless tobacco: Never Used  Substance and Sexual Activity  . Alcohol use: No  . Drug use: No  . Sexual activity: Not on file  Lifestyle  . Physical activity:    Days per week: Not on file    Minutes per session: Not on file  . Stress: Not on file  Relationships  . Social connections:    Talks on phone: Not on file    Gets together: Not on file    Attends religious service: Not on file    Active member of club or organization: Not on file    Attends meetings of clubs or organizations: Not on file    Relationship status: Not on file  Other Topics Concern  . Not on file  Social History Narrative   Lives with her mom.    Takes care of aunt who lives across the street.    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           Family History:  The patient's family history includes Cancer (age of onset: 67) in her brother; Cancer (age of onset: 15) in her maternal grandmother; Heart disease in her father; Stroke in her paternal grandfather; Stroke (age of onset: 25) in her maternal grandfather.   ROS:   Please see the history of present illness.    ROS All other systems reviewed and are negative.   PHYSICAL EXAM:   VS:  BP 118/68   Pulse 64   Ht 5\' 1"  (1.549 m)   Wt 162 lb (73.5 kg)   BMI 30.61 kg/m    GENERAL:  Well appearing overweight WF in NAD HEENT:  PERRL, EOMI, sclera are clear. Oropharynx is clear. NECK:  No jugular venous distention, carotid upstroke brisk and symmetric, no bruits, no thyromegaly or adenopathy LUNGS:  Clear to auscultation bilaterally CHEST:  Unremarkable HEART:  RRR,  PMI not displaced or sustained,S1 and  S2 within normal limits, no S3, no S4: no clicks, no rubs, gr 7-1/0 late systolic murmur at the apex. ABD:  Soft, nontender. BS +, no masses or bruits. No hepatomegaly, no splenomegaly EXT:  2 + pulses throughout, no edema, no cyanosis no clubbing SKIN:  Warm and dry.  No rashes NEURO:  Alert and oriented x 3. Cranial nerves II through XII intact. PSYCH:  Cognitively intact    Wt Readings from Last 3 Encounters:  12/06/17 162 lb (73.5 kg)  11/03/17 159 lb 12.8 oz (72.5 kg)  09/08/17 155 lb (70.3 kg)      Studies/Labs Reviewed:   EKG:  EKG is not ordered today.    Recent Labs: 08/16/2017: ALT 12; TSH 2.32 11/03/2017: BUN 24; Creatinine, Ser 0.97; Hemoglobin 14.5; Platelets 268; Potassium 5.2; Sodium 138   Lipid Panel    Component Value Date/Time   CHOL 199 08/16/2017 0907   TRIG 193 (H) 08/16/2017 0907   HDL 45 (L) 08/16/2017 0907   CHOLHDL 4.4 08/16/2017 0907   VLDL 31 (H) 08/11/2016 0857   LDLCALC 123 (H) 08/16/2017 0907    Additional studies/ records that were reviewed today include:   Echo 09/01/2016 LV EF: 60% -   65% Study Conclusions  - Left ventricle: The cavity size was normal. Wall thickness was   increased in a pattern of mild LVH. There was moderate focal   basal hypertrophy of the septum. Systolic function was normal.   The estimated ejection fraction was in the range of 60% to 65%.   Wall motion was normal; there were no regional wall motion   abnormalities. Doppler parameters are consistent with abnormal   left ventricular relaxation (grade 1 diastolic dysfunction). The   E/e&' ratio is >15, suggesting elevated LV filling pressure. - Aortic valve: Trileaflet. Sclerosis without stenosis. There was   no regurgitation. - Mitral valve: Thickened leaflets with posterior leaflet prolapse.   Trace to mild regurgitation. - Left atrium: The atrium was moderately dilated. - Tricuspid valve: There was trivial regurgitation. - Pulmonary arteries: PA peak  pressure: 21 mm Hg (S). - Inferior vena cava: The vessel was normal in size. The   respirophasic diameter changes were in the normal range (>= 50%),   consistent with normal central venous pressure.  Impressions:  - LVEF 60-65%, mild LVH with moderate focal basal septal   hypertrophy, normal wall motion, grade 1 DD with elevated LV   filling pressure, aortic valve sclerosis, posterior mitral valve   leaflet prolapse with mild to moderate anteriorly directed mitral   regurgitation, moderate LAE, trivial TR, RVSP 21 mmHg, normal   IVC.   Echo 09/20/2017 LV EF: 55% -   60% Study Conclusions  - Left ventricle: The cavity size was normal. Wall thickness was   increased in a pattern of mild LVH. Systolic function was normal.   The estimated ejection fraction was in the range of 55% to 60%. - Mitral valve: There is bileaflet prolapse of the MV MR is   directed anteirorly into the left atrium IT is at least   moderately severe . - Left atrium: The atrium was mildly dilated. - Pulmonary arteries: PA peak pressure: 35 mm Hg (S).  TEE 11/09/17: Study Conclusions  - Left ventricle: The cavity size was normal. Wall thickness was   normal. The estimated ejection fraction was 60%. Wall motion was   normal; there were no regional wall motion abnormalities. - Aortic valve: There was no stenosis. There was trivial   regurgitation. - Aorta: Normal caliber aorta with minimal plaque. - Left atrium: The atrium was mildly dilated. No evidence of   thrombus in the atrial cavity or appendage. - Right ventricle: The cavity size was normal. Systolic function   was normal. - Right atrium: No evidence of thrombus in the atrial cavity or   appendage. - Atrial septum: No ASD/PFO by color doppler. - Tricuspid  valve: Peak RV-RA gradient (S): 18 mm Hg. - Impressions: Severe mitral regurgitation in setting of bileaflet   MVP and partial flail P2 scallop.  Impressions:  - Severe mitral regurgitation  in setting of bileaflet MVP and   partial flail P2 scallop.  ASSESSMENT:    1. Severe mitral valve regurgitation   2. Mitral valve prolapse   3. Pre-procedure lab exam      PLAN:  In order of problems listed above:  1. Mitral valve regurgitation secondary to partially flail P2 segment of the MV with MV prolapse. MR is severe by TEE with jet directed anteriorly. This has increased compared to Echo done in April 2018. Now with mild pulmonary HTN.  Patient does complain of gradual worsening fatigue, however denies any worsening chest discomfort with exertion or shortness of breath.  She appears to be euvolemic on physical exam. We discussed the pathophysiology of her MV disease. We discussed fully treatment options including watchful waiting versus surgical valve repair. Given low operative risk I would not recommend Mitraclip. She understands that earlier repair may minimize risk for late complications including pulmonary HTN, CHF, arrhythmias. She would like to be proactive and proceed with elective MV repair. First I recommend a [place] to rule out CAD and to assess hemodynamics. Following this we will refer to Dr Roxy Manns for consideration for valve repair. The procedure and risks were reviewed including but not limited to death, myocardial infarction, stroke, arrythmias, bleeding, transfusion, emergency surgery, dye allergy, or renal dysfunction. The patient voices understanding and is agreeable to proceed. We will plan on performing her heart cath on August 27 when her son can be with her. g  2. Hyperlipidemia: mild. For now recommend lifestyle/diet modification. If she has CAD on cath will need statin therapy.  3. Hypothyroidism: Managed by primary care provider    Medication Adjustments/Labs and Tests Ordered: Current medicines are reviewed at length with the patient today.  Concerns regarding medicines are outlined above.  Medication changes, Labs and Tests ordered today are listed in the  Patient Instructions below. Patient Instructions    [facility] [address] [place] [address] Dept: [phone] Loc: [phone]  CORIN FORMISANO  12/06/2017  You are scheduled for a Cardiac Catheterization on ________________, ____________ ______ with Dr. Farley Crooker [place].  1. Please arrive at the [facility] (Main Entrance A) at [facility]: [address] [place], [place] [address] at __________ (two hours before your procedure to ensure your preparation). Free valet parking service is available.   Special note: Every effort is made to have your procedure done on time. Please understand that emergencies sometimes delay scheduled procedures.  2. Diet: Do not eat or drink anything after midnight prior to your procedure except sips of water to take medications.  3. Labs: You will need to have blood work done 1 week prior to your heart cath procedure.   4. Medication instructions in preparation for your procedure:  On the morning of your procedure, take Aspirin 81mg  and any morning medicines NOT listed above.  You may use sips of water.  5. Plan for one night stay--bring personal belongings. 6. Bring a current list of your medications and current insurance cards. 7. You MUST have a responsible person to drive you home. 8. Someone MUST be with you the first 24 hours after you arrive home or your discharge will be delayed. 9. Please wear clothes that are easy to  get on and off and wear slip-on shoes.  Thank you for allowing [place] to care for you!   -- [facility] Cardi[facility] services     Signed, Timon Geissinger Martinique, MD  12/06/2017 5:21 PM    [place] [place], [place],   [address] Phone: [phone]; Fax: [phone]

## 2017-12-06 ENCOUNTER — Encounter: Payer: Self-pay | Admitting: Cardiology

## 2017-12-06 ENCOUNTER — Ambulatory Visit: Payer: Medicare HMO | Admitting: Cardiology

## 2017-12-06 VITALS — BP 118/68 | HR 64 | Ht 61.0 in | Wt 162.0 lb

## 2017-12-06 DIAGNOSIS — Z01812 Encounter for preprocedural laboratory examination: Secondary | ICD-10-CM

## 2017-12-06 DIAGNOSIS — I340 Nonrheumatic mitral (valve) insufficiency: Secondary | ICD-10-CM | POA: Diagnosis not present

## 2017-12-06 DIAGNOSIS — I341 Nonrheumatic mitral (valve) prolapse: Secondary | ICD-10-CM

## 2017-12-06 NOTE — Patient Instructions (Addendum)
   [facility] [address] [place] [address] Dept: [phone] Loc: [phone]  MAKESHA BELITZ  12/06/2017  You are scheduled for a Cardiac Catheterization on Tuesday August 27th with Dr. Peter Martinique.  1. Please arrive at the [facility] (Main Entrance A) at [facility]: [address] [place], [place] [address] at 5:30am (two hours before your procedure to ensure your preparation). Free valet parking service is available.   Special note: Every effort is made to have your procedure done on time. Please understand that emergencies sometimes delay scheduled procedures.  2. Diet: Do not eat or drink anything after midnight prior to your procedure except sips of water to take medications.  3. Labs: You will need to have blood work done 1 week prior to your heart cath procedure.   4. Medication instructions in preparation for your procedure:  On the morning of your procedure, take Aspirin 81mg  and any morning medicines NOT listed above.  You may use sips of water.  5. Plan for one night stay--bring personal belongings. 6. Bring a current list of your medications and current insurance cards. 7. You MUST have a responsible person to drive you home. 8. Someone MUST be with you the first 24 hours after you arrive home or your discharge will be delayed. 9. Please wear clothes that are easy to get on and off and wear slip-on shoes.  Thank you for allowing [place] to care for you!   -- [place] [facility]

## 2018-01-12 DIAGNOSIS — Z01812 Encounter for preprocedural laboratory examination: Secondary | ICD-10-CM | POA: Diagnosis not present

## 2018-01-12 DIAGNOSIS — I341 Nonrheumatic mitral (valve) prolapse: Secondary | ICD-10-CM | POA: Diagnosis not present

## 2018-01-12 DIAGNOSIS — I340 Nonrheumatic mitral (valve) insufficiency: Secondary | ICD-10-CM | POA: Diagnosis not present

## 2018-01-12 LAB — CBC
HEMATOCRIT: 41.7 % (ref 34.0–46.6)
HEMOGLOBIN: 14.1 g/dL (ref 11.1–15.9)
MCH: 30.6 pg (ref 26.6–33.0)
MCHC: 33.8 g/dL (ref 31.5–35.7)
MCV: 91 fL (ref 79–97)
Platelets: 223 10*3/uL (ref 150–450)
RBC: 4.61 x10E6/uL (ref 3.77–5.28)
RDW: 13.4 % (ref 12.3–15.4)
WBC: 6.6 10*3/uL (ref 3.4–10.8)

## 2018-01-12 LAB — BASIC METABOLIC PANEL
BUN / CREAT RATIO: 17 (ref 12–28)
BUN: 20 mg/dL (ref 8–27)
CO2: 23 mmol/L (ref 20–29)
CREATININE: 1.16 mg/dL — AB (ref 0.57–1.00)
Calcium: 9.1 mg/dL (ref 8.7–10.3)
Chloride: 99 mmol/L (ref 96–106)
GFR, EST AFRICAN AMERICAN: 54 mL/min/{1.73_m2} — AB (ref >59)
GFR, EST NON AFRICAN AMERICAN: 47 mL/min/{1.73_m2} — AB (ref >59)
Glucose: 78 mg/dL (ref 65–99)
Potassium: 4.2 mmol/L (ref 3.5–5.2)
SODIUM: 138 mmol/L (ref 134–144)

## 2018-01-17 ENCOUNTER — Telehealth: Payer: Self-pay | Admitting: *Deleted

## 2018-01-17 NOTE — Telephone Encounter (Signed)
Pt contacted pre-catheterization scheduled at [facility] for: Tuesday January 17, 2018 7:30 AM Verified arrival time and place: [place] Entrance A at: 5:30 AM  No solid food after midnight prior to cath, clear liquids until 5 AM day of procedure. Verified no diabetes medications.  Hold: NSAIDS  AM meds can be  taken pre-cath with sip of water including: ASA 81 mg  Confirmed patient has responsible person to drive home post procedure and for 24 hours after you arrive home:yes

## 2018-01-18 ENCOUNTER — Ambulatory Visit (HOSPITAL_COMMUNITY)
Admission: RE | Admit: 2018-01-18 | Discharge: 2018-01-18 | Disposition: A | Discharge status: Home / Self Care | Payer: Medicare HMO | Source: Ambulatory Visit | Attending: Cardiology | Admitting: Cardiology

## 2018-01-18 ENCOUNTER — Other Ambulatory Visit: Payer: Self-pay

## 2018-01-18 ENCOUNTER — Encounter (HOSPITAL_COMMUNITY)
Admission: RE | Disposition: A | Discharge status: Home / Self Care | Payer: Self-pay | Source: Ambulatory Visit | Attending: Cardiology

## 2018-01-18 DIAGNOSIS — I340 Nonrheumatic mitral (valve) insufficiency: Secondary | ICD-10-CM | POA: Diagnosis present

## 2018-01-18 DIAGNOSIS — E039 Hypothyroidism, unspecified: Secondary | ICD-10-CM | POA: Diagnosis not present

## 2018-01-18 DIAGNOSIS — M8580 Other specified disorders of bone density and structure, unspecified site: Secondary | ICD-10-CM | POA: Insufficient documentation

## 2018-01-18 DIAGNOSIS — I272 Pulmonary hypertension, unspecified: Secondary | ICD-10-CM | POA: Insufficient documentation

## 2018-01-18 DIAGNOSIS — E785 Hyperlipidemia, unspecified: Secondary | ICD-10-CM | POA: Insufficient documentation

## 2018-01-18 DIAGNOSIS — I341 Nonrheumatic mitral (valve) prolapse: Secondary | ICD-10-CM | POA: Diagnosis not present

## 2018-01-18 HISTORY — PX: RIGHT/LEFT HEART CATH AND CORONARY ANGIOGRAPHY: CATH118266

## 2018-01-18 LAB — POCT I-STAT 3, VENOUS BLOOD GAS (G3P V)
BICARBONATE: 25.8 mmol/L (ref 20.0–28.0)
O2 Saturation: 75 %
TCO2: 27 mmol/L (ref 22–32)
pCO2, Ven: 47.1 mmHg (ref 44.0–60.0)
pH, Ven: 7.347 (ref 7.250–7.430)
pO2, Ven: 43 mmHg (ref 32.0–45.0)

## 2018-01-18 LAB — POCT I-STAT 3, ART BLOOD GAS (G3+)
ACID-BASE DEFICIT: 1 mmol/L (ref 0.0–2.0)
Bicarbonate: 24.5 mmol/L (ref 20.0–28.0)
O2 SAT: 99 %
TCO2: 26 mmol/L (ref 22–32)
pCO2 arterial: 40.7 mmHg (ref 32.0–48.0)
pH, Arterial: 7.387 (ref 7.350–7.450)
pO2, Arterial: 153 mmHg — ABNORMAL HIGH (ref 83.0–108.0)

## 2018-01-18 SURGERY — RIGHT/LEFT HEART CATH AND CORONARY ANGIOGRAPHY
Anesthesia: LOCAL

## 2018-01-18 MED ORDER — LIDOCAINE HCL (PF) 1 % IJ SOLN
INTRAMUSCULAR | Status: DC | PRN
  Administered 2018-01-18 (×3): 2 mL

## 2018-01-18 MED ORDER — ACETAMINOPHEN 325 MG PO TABS: 650.0000 mg | ORAL_TABLET | ORAL | Status: DC | PRN

## 2018-01-18 MED ORDER — SODIUM CHLORIDE 0.9% FLUSH: 3.0000 mL | Freq: Two times a day (BID) | INTRAVENOUS | Status: DC

## 2018-01-18 MED ORDER — FENTANYL CITRATE (PF) 100 MCG/2ML IJ SOLN
INTRAMUSCULAR | Status: DC | PRN
  Administered 2018-01-18: 25 ug via INTRAVENOUS

## 2018-01-18 MED ORDER — VERAPAMIL HCL 2.5 MG/ML IV SOLN
INTRAVENOUS | Status: AC
  Filled 2018-01-18: qty 2

## 2018-01-18 MED ORDER — SODIUM CHLORIDE 0.9% FLUSH: 3.0000 mL | INTRAVENOUS | Status: DC | PRN

## 2018-01-18 MED ORDER — IOHEXOL 350 MG/ML SOLN
INTRAVENOUS | Status: DC | PRN
  Administered 2018-01-18: 55 mL via INTRA_ARTERIAL

## 2018-01-18 MED ORDER — SODIUM CHLORIDE 0.9 % WEIGHT BASED INFUSION: 1.0000 mL/kg/h | INTRAVENOUS | Status: DC

## 2018-01-18 MED ORDER — ONDANSETRON HCL 4 MG/2ML IJ SOLN: 4.0000 mg | Freq: Four times a day (QID) | INTRAMUSCULAR | Status: DC | PRN

## 2018-01-18 MED ORDER — HEPARIN (PORCINE) IN NACL 1000-0.9 UT/500ML-% IV SOLN
INTRAVENOUS | Status: DC | PRN
  Administered 2018-01-18 (×2): 500 mL

## 2018-01-18 MED ORDER — VERAPAMIL HCL 2.5 MG/ML IV SOLN
INTRAVENOUS | Status: DC | PRN
  Administered 2018-01-18: 10 mL via INTRA_ARTERIAL

## 2018-01-18 MED ORDER — SODIUM CHLORIDE 0.9 % IV SOLN: 250.0000 mL | INTRAVENOUS | Status: DC | PRN

## 2018-01-18 MED ORDER — MIDAZOLAM HCL 2 MG/2ML IJ SOLN
INTRAMUSCULAR | Status: AC
  Filled 2018-01-18: qty 2

## 2018-01-18 MED ORDER — FENTANYL CITRATE (PF) 100 MCG/2ML IJ SOLN
INTRAMUSCULAR | Status: AC
  Filled 2018-01-18: qty 2

## 2018-01-18 MED ORDER — HEPARIN SODIUM (PORCINE) 1000 UNIT/ML IJ SOLN
INTRAMUSCULAR | Status: DC | PRN
  Administered 2018-01-18: 3500 [IU] via INTRAVENOUS

## 2018-01-18 MED ORDER — SODIUM CHLORIDE 0.9 % WEIGHT BASED INFUSION
3.0000 mL/kg/h | INTRAVENOUS | Status: AC
  Administered 2018-01-18: 3 mL/kg/h via INTRAVENOUS

## 2018-01-18 MED ORDER — MIDAZOLAM HCL 2 MG/2ML IJ SOLN
INTRAMUSCULAR | Status: DC | PRN
  Administered 2018-01-18: 1 mg via INTRAVENOUS

## 2018-01-18 MED ORDER — HEPARIN (PORCINE) IN NACL 1000-0.9 UT/500ML-% IV SOLN
INTRAVENOUS | Status: AC
  Filled 2018-01-18: qty 1000

## 2018-01-18 MED ORDER — LIDOCAINE HCL (PF) 1 % IJ SOLN
INTRAMUSCULAR | Status: AC
  Filled 2018-01-18: qty 30

## 2018-01-18 MED ORDER — ASPIRIN 81 MG PO CHEW: 81.0000 mg | CHEWABLE_TABLET | ORAL | Status: DC

## 2018-01-18 MED ORDER — HEPARIN SODIUM (PORCINE) 1000 UNIT/ML IJ SOLN
INTRAMUSCULAR | Status: AC
  Filled 2018-01-18: qty 1

## 2018-01-18 SURGICAL SUPPLY — 12 items
CATH BALLN WEDGE 5F 110CM (CATHETERS) ×2 IMPLANT
CATH IMPULSE 5F ANG/FL3.5 (CATHETERS) ×2 IMPLANT
DEVICE RAD COMP TR BAND LRG (VASCULAR PRODUCTS) ×2 IMPLANT
GLIDESHEATH SLEND SS 6F .021 (SHEATH) ×2 IMPLANT
GUIDEWIRE INQWIRE 1.5J.035X260 (WIRE) ×1 IMPLANT
INQWIRE 1.5J .035X260CM (WIRE) ×2
KIT HEART LEFT (KITS) ×2 IMPLANT
PACK CARDIAC CATHETERIZATION (CUSTOM PROCEDURE TRAY) ×2 IMPLANT
SHEATH GLIDE SLENDER 4/5FR (SHEATH) ×2 IMPLANT
SYR MEDRAD MARK V 150ML (SYRINGE) ×2 IMPLANT
TRANSDUCER W/STOPCOCK (MISCELLANEOUS) ×2 IMPLANT
TUBING CIL FLEX 10 FLL-RA (TUBING) ×2 IMPLANT

## 2018-01-18 NOTE — H&P (Signed)
Cardiology Admission History and Physical:   Patient ID: Maria Gomez; MRN: [id number]; DOB: 1945/10/13   Admission date: 01/18/2018  Primary Care Provider: Orlena Sheldon, PA-C Primary Cardiologist: Kenniya Westrich [place], MD  Primary Electrophysiologist:  N/A  Chief Complaint:  murmur  Patient Profile:   Maria Gomez is a 72 y.o. female with Severe MR  History of Present Illness:   [name] has a PMH of hypothyroidism and HLD but no prior cardiac history.  On a previous office visit with his primary care provider in March 2018, a heart murmur was heard.  Echocardiogram obtained on 09/01/2016 showed EF 60 to 65%, mild LVH with moderate focal basal septal hypertrophy, grade 1 DD, thickened mitral leaflet with posterior leaflet prolapse, mild MR, moderate LAE, PA peak pressure was 21 mmHg.  Carotid Doppler obtained on 09/04/2016 showed no significant disease bilaterally.  Echocardiogram was repeated on 09/20/2017, this showed EF 55 to 60%, bileaflet prolapse of mitral valve with at least moderate to severe MR directed anteriorly into the left atrium, PA peak pressure 35 mmHg. She subsequently underwent TEE showing bileaflet MVP with a flail P2 segment and severe MR.   She notes that she has felt well this year until April. She was cleaning out an old barn with a lot of old leaf mold. Following this she felt ill and had a fever and some upper respiratory symptoms. This improved but she continued to feel more fatigued than usual. She has noted more palpitations- more a sensation that her heart is beating hard. Otherwise she has been in excellent health. She cares for her 64 yo mother and does a lot of yard work with mowing and tree/bush trimming. Denies chest pain or edema.   Past Medical History:  Diagnosis Date  . Hypothyroidism   . Osteopenia     Past Surgical History:  Procedure Laterality Date  . BREAST EXCISIONAL BIOPSY Bilateral   . TEE WITHOUT CARDIOVERSION N/A 11/09/2017   Procedure: TRANSESOPHAGEAL ECHOCARDIOGRAM (TEE);  Surgeon: Larey Dresser, MD;  Location: [facility] ENDOSCOPY;  Service: Cardiovascular;  Laterality: N/A;     Medications Prior to Admission: Prior to Admission medications   Medication Sig Start Date End Date Taking? Authorizing Provider  alendronate (FOSAMAX) 70 MG tablet Take 1 tablet (70 mg total) by mouth every 7 (seven) days. Take with a full glass of water on an empty stomach.Sit upright for 30 minutes after taking 09/28/17  Yes Orlena Sheldon, PA-C  Biotin 5000 MCG TABS Take 1 tablet by mouth daily.    Yes [provider]  cetirizine (ZYRTEC) 10 MG tablet Take 1 tablet (10 mg total) by mouth daily. Patient taking differently: Take 10 mg by mouth daily as needed for allergies.  08/23/17  Yes Orlena Sheldon, PA-C  Coenzyme Q10 (COQ10) 200 MG CAPS Take 1 capsule by mouth daily.   Yes [provider]  Cyanocobalamin ([employer] B-12) 1000 MCG TBCR Take 1 tablet by mouth daily.    Yes [provider]  docusate sodium (COLACE) 100 MG capsule Take 100 mg by mouth daily as needed for mild constipation or moderate constipation.   Yes [provider]  levothyroxine (SYNTHROID, LEVOTHROID) 25 MCG tablet TAKE 1 TABLET EVERY DAY BEFORE BREAKFAST Patient taking differently: Take 25 mcg by mouth daily before breakfast. TAKE 1 TABLET EVERY DAY BEFORE BREAKFAST 08/23/17  Yes Orlena Sheldon, PA-C  Magnesium 500 MG CAPS Take 1 capsule by mouth every morning.   Yes [provider]  Multiple Vitamin (MULTIVITAMIN) tablet Take 1 tablet by mouth daily. Centrum Silver   Yes [provider]  naproxen sodium (ALEVE) 220 MG tablet Take 220 mg by mouth daily as needed (pain).   Yes [provider]  OMEGA-3 KRILL OIL PO Take 350 mg by mouth daily. Reported on 07/22/2015   Yes [provider]  Probiotic Product (PROBIOTIC DAILY PO) Take 1 capsule by mouth daily. Florify   Yes [provider]  Propylene Glycol  (SYSTANE COMPLETE) 0.6 % SOLN Place 1 drop into both eyes daily as needed (dry eyes).   Yes [provider]  zinc gluconate 50 MG tablet Take 50 mg by mouth daily.   Yes [provider]     Allergies:    Allergies  Allergen Reactions  . Niaspan [Niacin Er] Other (See Comments)    Severe flushing  . Phenazopyridine Hcl Other (See Comments)    Made sick Other reaction(s): Other (See Comments) Made sick  . Statins     Other reaction(s): Other (See Comments) Severe flushing    Social History:   Social History   Socioeconomic History  . Marital status: Widowed    Spouse name: Not on file  . Number of children: Not on file  . Years of education: Not on file  . Highest education level: Not on file  Occupational History  . Not on file  Social Needs  . Financial resource strain: Not on file  . Food insecurity:    Worry: Not on file    Inability: Not on file  . Transportation needs:    Medical: Not on file    Non-medical: Not on file  Tobacco Use  . Smoking status: Never Smoker  . Smokeless tobacco: Never Used  Substance and Sexual Activity  . Alcohol use: No  . Drug use: No  . Sexual activity: Not on file  Lifestyle  . Physical activity:    Days per week: Not on file    Minutes per session: Not on file  . Stress: Not on file  Relationships  . Social connections:    Talks on phone: Not on file    Gets together: Not on file    Attends religious service: Not on file    Active member of club or organization: Not on file    Attends meetings of clubs or organizations: Not on file    Relationship status: Not on file  . Intimate partner violence:    Fear of current or ex partner: Not on file    Emotionally abused: Not on file    Physically abused: Not on file    Forced sexual activity: Not on file  Other Topics Concern  . Not on file  Social History Narrative   Lives with her mom.    Takes care of aunt who lives across the street.    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          Family History:   The patient's family history includes Cancer (age of onset: 3) in her brother; Cancer (age of onset: 57) in her maternal grandmother; Heart disease in her father; Stroke in her paternal grandfather; Stroke (age of onset: 57) in her maternal grandfather.    ROS:  Please see the history of present illness.  All other ROS reviewed and negative.     Physical Exam/Data:   Vitals:   01/18/18 0548  BP: 127/78  Pulse: 64  Resp: 18  Temp: 98.2 F (36.8 C)  TempSrc: Oral  SpO2: 98%  Weight: 71.7 kg  Height: 5\' 1"  (1.549 m)   No intake or output data in the 24 hours ending 01/18/18 0705 Filed Weights   01/18/18 0548  Weight: 71.7 kg   Body mass index is 29.85 kg/m.  GENERAL:  Well appearing overweight WF in NAD HEENT:  PERRL, EOMI, sclera are clear. Oropharynx is clear. NECK:  No jugular venous distention, carotid upstroke brisk and symmetric, no bruits, no thyromegaly or adenopathy LUNGS:  Clear to auscultation bilaterally CHEST:  Unremarkable HEART:  RRR,  PMI not displaced or sustained,S1 and S2 within normal limits, no S3, no S4: no clicks, no rubs, gr 2-4/2 late systolic murmur at the apex. ABD:  Soft, nontender. BS +, no masses or bruits. No hepatomegaly, no splenomegaly EXT:  2 + pulses throughout, no edema, no cyanosis no clubbing SKIN:  Warm and dry.  No rashes NEURO:  Alert and oriented x 3. Cranial nerves II through XII intact. PSYCH:  Cognitively intact   EKG:  The ECG that was done today was personally reviewed and demonstrates NSR rate 61. Normal.   Relevant CV Studies: Echo 09/01/2016 LV EF: 60% - 65% Study Conclusions  - Left ventricle: The cavity size was normal. Wall thickness was increased in a pattern of mild LVH. There was moderate focal basal hypertrophy of the septum. Systolic function was normal. The estimated ejection fraction was in the range of 60% to  65%. Wall motion was normal; there were no regional wall motion abnormalities. Doppler parameters are consistent with abnormal left ventricular relaxation (grade 1 diastolic dysfunction). The E/e&' ratio is >15, suggesting elevated LV filling pressure. - Aortic valve: Trileaflet. Sclerosis without stenosis. There was no regurgitation. - Mitral valve: Thickened leaflets with posterior leaflet prolapse. Trace to mild regurgitation. - Left atrium: The atrium was moderately dilated. - Tricuspid valve: There was trivial regurgitation. - Pulmonary arteries: PA peak pressure: 21 mm Hg (S). - Inferior vena cava: The vessel was normal in size. The respirophasic diameter changes were in the normal range (>= 50%), consistent with normal central venous pressure.  Impressions:  - LVEF 60-65%, mild LVH with moderate focal basal septal hypertrophy, normal wall motion, grade 1 DD with elevated LV filling pressure, aortic valve sclerosis, posterior mitral valve leaflet prolapse with mild to moderate anteriorly directed mitral regurgitation, moderate LAE, trivial TR, RVSP 21 mmHg, normal IVC.   Echo 09/20/2017 LV EF: 55% - 60% Study Conclusions  - Left ventricle: The cavity size was normal. Wall thickness was increased in a pattern of mild LVH. Systolic function was normal. The estimated ejection fraction was in the range of 55% to 60%. - Mitral valve: There is bileaflet prolapse of the MV MR is directed anteirorly into the left atrium IT is at least moderately severe . - Left atrium: The atrium was mildly dilated. - Pulmonary arteries: PA peak pressure: 35 mm Hg (S).  TEE 11/09/17: Study Conclusions  - Left ventricle: The cavity size was normal. Wall thickness was normal. The estimated ejection fraction was 60%. Wall motion was normal; there were no regional wall motion abnormalities. - Aortic valve: There was no stenosis. There was  trivial regurgitation. - Aorta: Normal caliber aorta with minimal plaque. - Left atrium: The atrium was mildly dilated. No evidence of thrombus in the atrial cavity or appendage. - Right ventricle: The cavity size was normal. Systolic function was normal. - Right atrium: No evidence of  thrombus in the atrial cavity or appendage. - Atrial septum: No ASD/PFO by color doppler. - Tricuspid valve: Peak RV-RA gradient (S): 18 mm Hg. - Impressions: Severe mitral regurgitation in setting of bileaflet MVP and partial flail P2 scallop.  Impressions:  - Severe mitral regurgitation in setting of bileaflet MVP and partial flail P2 scallop.  Laboratory Data:  Chemistry Recent Labs  Lab 01/12/18 1119  NA 138  K 4.2  CL 99  CO2 23  GLUCOSE 78  BUN 20  CREATININE 1.16*  CALCIUM 9.1  GFRNONAA 47*  GFRAA 54*    No results for input(s): PROT, ALBUMIN, AST, ALT, ALKPHOS, BILITOT in the last 168 hours. Hematology Recent Labs  Lab 01/12/18 1119  WBC 6.6  RBC 4.61  HGB 14.1  HCT 41.7  MCV 91  MCH 30.6  MCHC 33.8  RDW 13.4  PLT 223   Cardiac EnzymesNo results for input(s): TROPONINI in the last 168 hours. No results for input(s): TROPIPOC in the last 168 hours.  BNPNo results for input(s): BNP, PROBNP in the last 168 hours.  DDimer No results for input(s): DDIMER in the last 168 hours.  Radiology/Studies:  No results found.  Assessment and Plan:   1. Mitral valve regurgitation secondary to partially flail P2 segment of the MV with MV prolapse. MR is severe by TEE with jet directed anteriorly. This has increased compared to Echo done in April 2018. Now with mild pulmonary HTN.  Patient does complain of gradual worsening fatigue, however denies any worsening chest discomfort with exertion or shortness of breath.  She appears to be euvolemic on physical exam. We discussed the pathophysiology of her MV disease. We discussed fully treatment options including watchful  waiting versus surgical valve repair. Given low operative risk I would not recommend Mitraclip. She understands that earlier repair may minimize risk for late complications including pulmonary HTN, CHF, arrhythmias. She would like to be proactive and proceed with elective MV repair. First I recommend a [place] to rule out CAD and to assess hemodynamics. Following this we will refer to Dr Roxy Manns for consideration for valve repair. The procedure and risks were reviewed including but not limited to death, myocardial infarction, stroke, arrythmias, bleeding, transfusion, emergency surgery, dye allergy, or renal dysfunction. The patient voices understanding and is agreeable to proceed. We will plan on performing her heart cath on August 27 when her son can be with her. g  2. Hyperlipidemia: mild. For now recommend lifestyle/diet modification. If she has CAD on cath will need statin therapy.  3. Hypothyroidism: Managed by primary care provider     For questions or updates, please contact [place] Please consult [email or web address] for contact info under Cardiology/STEMI.    Signed, Saleh Ulbrich [place], MD  01/18/2018 7:05 AM

## 2018-01-18 NOTE — Discharge Instructions (Signed)

## 2018-01-18 NOTE — Interval H&P Note (Signed)
History and Physical Interval Note:  01/18/2018 7:27 AM  Maria Gomez  has presented today for surgery, with the diagnosis of mr  The various methods of treatment have been discussed with the patient and family. After consideration of risks, benefits and other options for treatment, the patient has consented to  Procedure(s): RIGHT/LEFT HEART CATH AND CORONARY ANGIOGRAPHY (N/A) as a surgical intervention .  The patient's history has been reviewed, patient examined, no change in status, stable for surgery.  I have reviewed the patient's chart and labs.  Questions were answered to the patient's satisfaction.     Collier Salina [facility] 01/18/2018 7:27 AM

## 2018-01-18 NOTE — Progress Notes (Signed)
TRB removed, gauze with tegaderm placed, armboard placed site unremarkable will continue to monitor

## 2018-01-19 ENCOUNTER — Encounter (HOSPITAL_COMMUNITY): Payer: Self-pay | Admitting: Cardiology

## 2018-01-29 NOTE — Progress Notes (Signed)
Cardiology Office Note    Date:  02/02/2018   ID:  Maria, Gomez 1945-08-06, MRN [id number]  PCP:  Rennis Golden  Cardiologist:  Dr. Martinique  Chief Complaint  Patient presents with  . Follow-up    Post cath.  . Mitral Valve Prolapse    History of Present Illness:  Maria Gomez is a 72 y.o. female seen for followup Mitral insufficiency. She was intially evaluated by Almyra Deforest PA-C on 11/03/17 for evaluation of a heart murmur. She has a  PMH of hypothyroidism and HLD but no prior cardiac history.  On the previous office visit with his primary care provider in March 2018, a heart murmur was heard.  Echocardiogram obtained on 09/01/2016 showed EF 60 to 65%, mild LVH with moderate focal basal septal hypertrophy, grade 1 DD, thickened mitral leaflet with posterior leaflet prolapse, mild MR, moderate LAE, PA peak pressure was 21 mmHg.  Carotid Doppler obtained on 09/04/2016 showed no significant disease bilaterally.  Echocardiogram was repeated on 09/20/2017, this showed EF 55 to 60%, bileaflet prolapse of mitral valve with at least moderate to severe MR directed anteriorly into the left atrium, PA peak pressure 35 mmHg. She subsequently underwent TEE showing bileaflet MVP with a flail P2 segment and severe MR. She subsequently underwent [facility] on 8/27 confirming severe MR. Normal coronary arteries. Normal right heart pressures.    She continues to feel more fatigued than usual. She has noted more palpitations- more a sensation that her heart is beating hard. Otherwise she has been in excellent health. She cares for her 43 yo mother and does a lot of yard work with mowing and tree/bush trimming. Denies chest pain or edema.    Past Medical History:  Diagnosis Date  . Hypothyroidism   . Osteopenia     Past Surgical History:  Procedure Laterality Date  . BREAST EXCISIONAL BIOPSY Bilateral   . RIGHT/LEFT HEART CATH AND CORONARY ANGIOGRAPHY N/A 01/18/2018   Procedure: RIGHT/LEFT  HEART CATH AND CORONARY ANGIOGRAPHY;  Surgeon: Martinique, Peter M, MD;  Location: [facility];  Service: Cardiovascular;  Laterality: N/A;  . TEE WITHOUT CARDIOVERSION N/A 11/09/2017   Procedure: TRANSESOPHAGEAL ECHOCARDIOGRAM (TEE);  Surgeon: Larey Dresser, MD;  Location: [facility] ENDOSCOPY;  Service: Cardiovascular;  Laterality: N/A;    Current Medications: Outpatient Medications Prior to Visit  Medication Sig Dispense Refill  . alendronate (FOSAMAX) 70 MG tablet Take 1 tablet (70 mg total) by mouth every 7 (seven) days. Take with a full glass of water on an empty stomach.Sit upright for 30 minutes after taking 12 tablet 3  . Biotin 5000 MCG TABS Take 1 tablet by mouth daily.     . cetirizine (ZYRTEC) 10 MG tablet Take 1 tablet (10 mg total) by mouth daily. (Patient taking differently: Take 10 mg by mouth daily as needed for allergies. ) 90 tablet 0  . Coenzyme Q10 (COQ10) 200 MG CAPS Take 1 capsule by mouth daily.    . Cyanocobalamin ([employer] B-12) 1000 MCG TBCR Take 1 tablet by mouth daily.     Marland Kitchen docusate sodium (COLACE) 100 MG capsule Take 100 mg by mouth daily as needed for mild constipation or moderate constipation.    Marland Kitchen levothyroxine (SYNTHROID, LEVOTHROID) 25 MCG tablet TAKE 1 TABLET EVERY DAY BEFORE BREAKFAST (Patient taking differently: Take 25 mcg by mouth daily before breakfast. TAKE 1 TABLET EVERY DAY BEFORE BREAKFAST) 90 tablet 2  . Magnesium 500 MG CAPS Take 1 capsule by  mouth every morning.    . Multiple Vitamin (MULTIVITAMIN) tablet Take 1 tablet by mouth daily. Centrum Silver    . naproxen sodium (ALEVE) 220 MG tablet Take 220 mg by mouth daily as needed (pain).    . OMEGA-3 KRILL OIL PO Take 350 mg by mouth daily. Reported on 07/22/2015    . Probiotic Product (PROBIOTIC DAILY PO) Take 1 capsule by mouth daily. Florify    . Propylene Glycol (SYSTANE COMPLETE) 0.6 % SOLN Place 1 drop into both eyes daily as needed (dry eyes).    . zinc gluconate 50 MG tablet Take 50 mg by mouth  daily.     No facility-administered medications prior to visit.      Allergies:   Niaspan [niacin er]; Phenazopyridine hcl; and Statins   Social History   Socioeconomic History  . Marital status: Widowed    Spouse name: Not on file  . Number of children: Not on file  . Years of education: Not on file  . Highest education level: Not on file  Occupational History  . Not on file  Social Needs  . Financial resource strain: Not on file  . Food insecurity:    Worry: Not on file    Inability: Not on file  . Transportation needs:    Medical: Not on file    Non-medical: Not on file  Tobacco Use  . Smoking status: Never Smoker  . Smokeless tobacco: Never Used  Substance and Sexual Activity  . Alcohol use: No  . Drug use: No  . Sexual activity: Not on file  Lifestyle  . Physical activity:    Days per week: Not on file    Minutes per session: Not on file  . Stress: Not on file  Relationships  . Social connections:    Talks on phone: Not on file    Gets together: Not on file    Attends religious service: Not on file    Active member of club or organization: Not on file    Attends meetings of clubs or organizations: Not on file    Relationship status: Not on file  Other Topics Concern  . Not on file  Social History Narrative   Lives with her mom.    Takes care of aunt who lives across the street.    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           Family History:  The patient's family history includes Cancer (age of onset: 85) in her brother; Cancer (age of onset: 75) in her maternal grandmother; Heart disease in her father; Stroke in her paternal grandfather; Stroke (age of onset: 25) in her maternal grandfather.   ROS:   Please see the history of present illness.    ROS All other systems reviewed and are negative.   PHYSICAL EXAM:   VS:  BP 122/70 (BP Location: Left Arm, Patient Position: Sitting, Cuff Size: Normal)   Pulse 79   Ht 5\' 1"   (1.549 m)   Wt 159 lb (72.1 kg)   BMI 30.04 kg/m    GENERAL:  Well appearing overweight WF in NAD HEENT:  PERRL, EOMI, sclera are clear. Oropharynx is clear. NECK:  No jugular venous distention, carotid upstroke brisk and symmetric, no bruits, no thyromegaly or adenopathy LUNGS:  Clear to auscultation bilaterally CHEST:  Unremarkable HEART:  RRR,  PMI not displaced or sustained,S1 and S2 within normal limits, no S3, no S4: no  clicks, no rubs, gr 1-2/4 late systolic murmur at the apex. ABD:  Soft, nontender. BS +, no masses or bruits. No hepatomegaly, no splenomegaly EXT:  2 + pulses throughout, no edema, no cyanosis no clubbing. Radial cath site has healed well. SKIN:  Warm and dry.  No rashes NEURO:  Alert and oriented x 3. Cranial nerves II through XII intact. PSYCH:  Cognitively intact    Wt Readings from Last 3 Encounters:  02/02/18 159 lb (72.1 kg)  01/18/18 158 lb (71.7 kg)  12/06/17 162 lb (73.5 kg)      Studies/Labs Reviewed:   EKG:  EKG is not ordered today.    Recent Labs: 08/16/2017: ALT 12; TSH 2.32 01/12/2018: BUN 20; Creatinine, Ser 1.16; Hemoglobin 14.1; Platelets 223; Potassium 4.2; Sodium 138   Lipid Panel    Component Value Date/Time   CHOL 199 08/16/2017 0907   TRIG 193 (H) 08/16/2017 0907   HDL 45 (L) 08/16/2017 0907   CHOLHDL 4.4 08/16/2017 0907   VLDL 31 (H) 08/11/2016 0857   LDLCALC 123 (H) 08/16/2017 0907    Additional studies/ records that were reviewed today include:   Echo 09/01/2016 LV EF: 60% -   65% Study Conclusions  - Left ventricle: The cavity size was normal. Wall thickness was   increased in a pattern of mild LVH. There was moderate focal   basal hypertrophy of the septum. Systolic function was normal.   The estimated ejection fraction was in the range of 60% to 65%.   Wall motion was normal; there were no regional wall motion   abnormalities. Doppler parameters are consistent with abnormal   left ventricular relaxation (grade 1  diastolic dysfunction). The   E/e&' ratio is >15, suggesting elevated LV filling pressure. - Aortic valve: Trileaflet. Sclerosis without stenosis. There was   no regurgitation. - Mitral valve: Thickened leaflets with posterior leaflet prolapse.   Trace to mild regurgitation. - Left atrium: The atrium was moderately dilated. - Tricuspid valve: There was trivial regurgitation. - Pulmonary arteries: PA peak pressure: 21 mm Hg (S). - Inferior vena cava: The vessel was normal in size. The   respirophasic diameter changes were in the normal range (>= 50%),   consistent with normal central venous pressure.  Impressions:  - LVEF 60-65%, mild LVH with moderate focal basal septal   hypertrophy, normal wall motion, grade 1 DD with elevated LV   filling pressure, aortic valve sclerosis, posterior mitral valve   leaflet prolapse with mild to moderate anteriorly directed mitral   regurgitation, moderate LAE, trivial TR, RVSP 21 mmHg, normal   IVC.   Echo 09/20/2017 LV EF: 55% -   60% Study Conclusions  - Left ventricle: The cavity size was normal. Wall thickness was   increased in a pattern of mild LVH. Systolic function was normal.   The estimated ejection fraction was in the range of 55% to 60%. - Mitral valve: There is bileaflet prolapse of the MV MR is   directed anteirorly into the left atrium IT is at least   moderately severe . - Left atrium: The atrium was mildly dilated. - Pulmonary arteries: PA peak pressure: 35 mm Hg (S).  TEE 11/09/17: Study Conclusions  - Left ventricle: The cavity size was normal. Wall thickness was   normal. The estimated ejection fraction was 60%. Wall motion was   normal; there were no regional wall motion abnormalities. - Aortic valve: There was no stenosis. There was trivial   regurgitation. - Aorta: Normal  caliber aorta with minimal plaque. - Left atrium: The atrium was mildly dilated. No evidence of   thrombus in the atrial cavity or  appendage. - Right ventricle: The cavity size was normal. Systolic function   was normal. - Right atrium: No evidence of thrombus in the atrial cavity or   appendage. - Atrial septum: No ASD/PFO by color doppler. - Tricuspid valve: Peak RV-RA gradient (S): 18 mm Hg. - Impressions: Severe mitral regurgitation in setting of bileaflet   MVP and partial flail P2 scallop.  Impressions:  - Severe mitral regurgitation in setting of bileaflet MVP and   partial flail P2 scallop.  Cardiac cath 01/18/18: Procedures   RIGHT/LEFT HEART CATH AND CORONARY ANGIOGRAPHY  Conclusion     The left ventricular systolic function is normal.  LV end diastolic pressure is normal.  The left ventricular ejection fraction is 55-65% by visual estimate.  There is severe (4+) mitral regurgitation and moderate mitral valve prolapse.   1. Normal coronary anatomy 2. Normal LV function 3. Mitral valve prolapse with severe MR 3-4+ 4. Normal right heart pressures 5. Normal LV filling pressures  No indication for antiplatelet therapy at this time.   Will refer to Dr. Roxy Manns for consideration of MV repair     ASSESSMENT:    1. Mitral valve prolapse   2. Severe mitral valve regurgitation      PLAN:  In order of problems listed above:  1. Mitral valve regurgitation secondary to partially flail P2 segment of the MV with MV prolapse. MR is severe by TEE with jet directed anteriorly. This has increased compared to Echo done in April 2018.   Patient does complain of gradual worsening fatigue, however denies any worsening chest discomfort with exertion or shortness of breath.  She appears to be euvolemic on physical exam. She is scheduled to see Dr. Roxy Manns on Sept. 20 for consideration of MV repair.   2. Hyperlipidemia: mild. For now recommend lifestyle/diet modification.  3. Hypothyroidism: Managed by primary care provider    Medication Adjustments/Labs and Tests Ordered: Current medicines are  reviewed at length with the patient today.  Concerns regarding medicines are outlined above.  Medication changes, Labs and Tests ordered today are listed in the Patient Instructions below. There are no Patient Instructions on file for this visit.   Signed, Peter Martinique, MD  02/02/2018 1:41 PM    [facility] [place], [place], [place]  [address] Phone: [phone]; Fax: [phone]

## 2018-02-02 ENCOUNTER — Ambulatory Visit: Payer: Medicare HMO | Admitting: Cardiology

## 2018-02-02 ENCOUNTER — Encounter: Payer: Self-pay | Admitting: Cardiology

## 2018-02-02 VITALS — BP 122/70 | HR 79 | Ht 61.0 in | Wt 159.0 lb

## 2018-02-02 DIAGNOSIS — I341 Nonrheumatic mitral (valve) prolapse: Secondary | ICD-10-CM

## 2018-02-02 DIAGNOSIS — I340 Nonrheumatic mitral (valve) insufficiency: Secondary | ICD-10-CM | POA: Diagnosis not present

## 2018-02-11 ENCOUNTER — Encounter: Payer: Self-pay | Admitting: Thoracic Surgery (Cardiothoracic Vascular Surgery)

## 2018-02-11 ENCOUNTER — Other Ambulatory Visit: Payer: Self-pay | Admitting: *Deleted

## 2018-02-11 ENCOUNTER — Institutional Professional Consult (permissible substitution): Payer: Medicare HMO | Admitting: Thoracic Surgery (Cardiothoracic Vascular Surgery)

## 2018-02-11 VITALS — BP 124/76 | HR 65 | Resp 20 | Ht 61.0 in | Wt 160.0 lb

## 2018-02-11 DIAGNOSIS — Z01818 Encounter for other preprocedural examination: Secondary | ICD-10-CM

## 2018-02-11 DIAGNOSIS — I7409 Other arterial embolism and thrombosis of abdominal aorta: Secondary | ICD-10-CM

## 2018-02-11 DIAGNOSIS — I7101 Dissection of thoracic aorta: Secondary | ICD-10-CM

## 2018-02-11 DIAGNOSIS — I340 Nonrheumatic mitral (valve) insufficiency: Secondary | ICD-10-CM

## 2018-02-11 DIAGNOSIS — I71019 Dissection of thoracic aorta, unspecified: Secondary | ICD-10-CM

## 2018-02-11 NOTE — Patient Instructions (Addendum)
Stop taking omega 3 fish oil capsules (krill oil)  Continue taking all other medications without change through the day before surgery.  Have nothing to eat or drink after midnight the night before surgery.  On the morning of surgery take only levothyroxine (Synthroid) with a sip of water.

## 2018-02-11 NOTE — Progress Notes (Signed)
[facility] Gomez SURGERY CONSULTATION REPORT  Referring Provider is Martinique, Maria M, MD PCP is Orlena Sheldon, PA-C  Chief Complaint  Patient presents with  . Mitral Regurgitation    Surgical eval, Cardiac Cath 01/18/18, TEE 11/09/17, ECHO 09/20/17    HPI:  Patient is a 72 year old female who has been referred for surgical consultation to discuss treatment options for management of mitral valve prolapse with stage D severe symptomatic primary mitral regurgitation.  Patient has history of hypothyroidism and mild osteopenia but essentially has been healthy essentially all of her adult life.  She was recently noted to have a systolic murmur on physical exam and was referred for cardiology consultation.  She was initially evaluated by Almyra Deforest and more recently by Dr. Martinique.  Transthoracic echocardiogram performed September 20, 2017 revealed normal left ventricular systolic function with ejection fraction estimated 55 to 60%.  There was mitral valve prolapse with at least moderately severe mitral regurgitation.  Transesophageal echocardiogram performed November 09, 2017 confirmed the presence of bileaflet prolapse of the mitral valve with partially flail segment involving the middle scallop of the posterior leaflet.  There was felt to be severe mitral regurgitation although quantification using PISA was not possible because of the extremely eccentric jet of regurgitation.  There was mild left atrial enlargement and normal left ventricular size and systolic function.  No other significant abnormalities were noted.  Diagnostic cardiac catheterization was performed January 18, 2018.  The patient was found to have normal coronary arteries with no significant coronary artery disease.  There was severe mitral regurgitation with normal right heart pressures and normal left heart filling pressures.  Cardiothoracic surgical consultation was  requested.  Patient is widowed and lives with her elderly mother in [place].  She has remained healthy and physically active all of her adult life.  She is a retired Radio producer.  She looks after her mother who suffers from dementia.  She takes care of her entire house and performs chores around the house including mowing 10 acres of grass every week.  She complains that over the past year she has developed progressive decline in her exercise tolerance.  She gets fatigued much more easily than she used to.  She has occasional palpitations.  She does not experience much shortness of breath although she does note a dry persistent cough.  She denies any history of PND, orthopnea, or lower extremity edema.  She has not had any chest pain or chest tightness either with activity or at rest  Past Medical History:  Diagnosis Date  . Hypothyroidism   . Osteopenia     Past Surgical History:  Procedure Laterality Date  . BREAST EXCISIONAL BIOPSY Bilateral   . RIGHT/LEFT HEART CATH AND CORONARY ANGIOGRAPHY N/A 01/18/2018   Procedure: RIGHT/LEFT HEART CATH AND CORONARY ANGIOGRAPHY;  Surgeon: Martinique, Maria M, MD;  Location: [facility];  Service: Cardiovascular;  Laterality: N/A;  . TEE WITHOUT CARDIOVERSION N/A 11/09/2017   Procedure: TRANSESOPHAGEAL ECHOCARDIOGRAM (TEE);  Surgeon: Larey Dresser, MD;  Location: [facility];  Service: Cardiovascular;  Laterality: N/A;    Family History  Problem Relation Age of Onset  . Heart disease Father   . Cancer Brother 35       ColoRectal Cancer  . Cancer Maternal Grandmother 62  . Stroke Maternal Grandfather 70  . Stroke Paternal Grandfather     Social History   Socioeconomic History  . Marital status: Widowed  Spouse name: Not on file  . Number of children: Not on file  . Years of education: Not on file  . Highest education level: Not on file  Occupational History  . Not on file  Social Needs  . Financial resource strain: Not on file  .  Food insecurity:    Worry: Not on file    Inability: Not on file  . Transportation needs:    Medical: Not on file    Non-medical: Not on file  Tobacco Use  . Smoking status: Never Smoker  . Smokeless tobacco: Never Used  Substance and Sexual Activity  . Alcohol use: No  . Drug use: No  . Sexual activity: Not on file  Lifestyle  . Physical activity:    Days per week: Not on file    Minutes per session: Not on file  . Stress: Not on file  Relationships  . Social connections:    Talks on phone: Not on file    Gets together: Not on file    Attends religious service: Not on file    Active member of club or organization: Not on file    Attends meetings of clubs or organizations: Not on file    Relationship status: Not on file  . Intimate partner violence:    Fear of current or ex partner: Not on file    Emotionally abused: Not on file    Physically abused: Not on file    Forced sexual activity: Not on file  Other Topics Concern  . Not on file  Social History Narrative   Lives with her mom.    Takes care of aunt who lives across the street.    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          Current Outpatient Medications  Medication Sig Dispense Refill  . alendronate (FOSAMAX) 70 MG tablet Take 1 tablet (70 mg total) by mouth every 7 (seven) days. Take with a full glass of water on an empty stomach.Sit upright for 30 minutes after taking 12 tablet 3  . Biotin 5000 MCG TABS Take 1 tablet by mouth daily.     . cetirizine (ZYRTEC) 10 MG tablet Take 1 tablet (10 mg total) by mouth daily. (Patient taking differently: Take 10 mg by mouth daily as needed for allergies. ) 90 tablet 0  . Coenzyme Q10 (COQ10) 200 MG CAPS Take 1 capsule by mouth daily.    . Cyanocobalamin ([employer] B-12) 1000 MCG TBCR Take 1 tablet by mouth daily.     Marland Kitchen docusate sodium (COLACE) 100 MG capsule Take 100 mg by mouth daily as needed for mild constipation or moderate constipation.    Marland Kitchen  levothyroxine (SYNTHROID, LEVOTHROID) 25 MCG tablet TAKE 1 TABLET EVERY DAY BEFORE BREAKFAST (Patient taking differently: Take 25 mcg by mouth daily before breakfast. TAKE 1 TABLET EVERY DAY BEFORE BREAKFAST) 90 tablet 2  . Magnesium 500 MG CAPS Take 1 capsule by mouth every morning.    . Multiple Vitamin (MULTIVITAMIN) tablet Take 1 tablet by mouth daily. Centrum Silver    . naproxen sodium (ALEVE) 220 MG tablet Take 220 mg by mouth daily as needed (pain).    . OMEGA-3 KRILL OIL PO Take 350 mg by mouth daily. Reported on 07/22/2015    . Probiotic Product (PROBIOTIC DAILY PO) Take 1 capsule by mouth daily. Florify    . Propylene Glycol (SYSTANE COMPLETE) 0.6 % SOLN Place 1 drop into  both eyes daily as needed (dry eyes).    . zinc gluconate 50 MG tablet Take 50 mg by mouth daily.     No current facility-administered medications for this visit.     Allergies  Allergen Reactions  . Niaspan [Niacin Er] Other (See Comments)    Severe flushing  . Phenazopyridine Hcl Other (See Comments)    Made sick Other reaction(s): Other (See Comments) Made sick  . Statins     Other reaction(s): Other (See Comments) Severe flushing      Review of Systems:   General:  normal appetite, decreased energy, no weight gain, no weight loss, no fever  Cardiac:  no chest pain with exertion, no chest pain at rest, + mild SOB with exertion, no resting SOB, no PND, no orthopnea, + palpitations, no arrhythmia, no atrial fibrillation, no LE edema, no dizzy spells, no syncope  Respiratory:  no shortness of breath, no home oxygen, no productive cough, + dry cough, no bronchitis, no wheezing, no hemoptysis, no asthma, no pain with inspiration or cough, no sleep apnea, no CPAP at night  GI:   no difficulty swallowing, no reflux, + frequent heartburn, no hiatal hernia, no abdominal pain, + constipation, no diarrhea, no hematochezia, no hematemesis, no melena  GU:   no dysuria,  + frequency, no urinary tract infection, no  hematuria, no kidney stones, no kidney disease  Vascular:  no pain suggestive of claudication, no pain in feet, no leg cramps, no varicose veins, no DVT, no non-healing foot ulcer  Neuro:   no stroke, no TIA's, no seizures, no headaches, no temporary blindness one eye,  no slurred speech, no peripheral neuropathy, no chronic pain, no instability of gait, no memory/cognitive dysfunction  Musculoskeletal: no arthritis, no joint swelling, no myalgias, no difficulty walking, normal mobility   Skin:   no rash, no itching, no skin infections, no pressure sores or ulcerations  Psych:   + anxiety, no depression, no nervousness, no unusual recent stress  Eyes:   no blurry vision, no floaters, no recent vision changes, + wears glasses or contacts  ENT:   no hearing loss, no loose or painful teeth, no dentures, last saw dentist 1 year ago but scheduled for cleaning 02/17/2018  Hematologic:  no easy bruising, no abnormal bleeding, no clotting disorder, no frequent epistaxis  Endocrine:  no diabetes, does not check CBG's at home           Physical Exam:   BP 124/76   Pulse 65   Resp 20   Ht 5\' 1"  (1.549 Gomez)   Wt 160 lb (72.6 kg)   SpO2 98% Comment: RA  BMI 30.23 kg/Gomez   General:  Mildly obese,  well-appearing  HEENT:  Unremarkable   Neck:   no JVD, no bruits, no adenopathy   Chest:   clear to auscultation, symmetrical breath sounds, no wheezes, no rhonchi   CV:   RRR, grade III/VI systolic murmur heard best at apex,  no diastolic murmur  Abdomen:  soft, non-tender, no masses   Extremities:  warm, well-perfused, pulses diminished, no LE edema  Rectal/GU  Deferred  Neuro:   Grossly non-focal and symmetrical throughout  Skin:   Clean and dry, no rashes, no breakdown   Diagnostic Tests:  Transthoracic Echocardiography  Patient:    Maria Gomez, Maria Gomez MR #:       [id number] Study Date: 09/20/2017 Gender:     F Age:        44 Height:  156.2 cm Weight:     70.3 kg BSA:        1.77  Gomez^2 Pt. Status: Room:   SONOGRAPHER  Diamond Nickel  ATTENDING    Sanda Klein, MD  Layla Maw  REFERRING    [name] B  PERFORMING   [facility], Outpatient  cc:  ------------------------------------------------------------------- LV EF: 55% -   60%  ------------------------------------------------------------------- Indications:      R01.1 Murmur.  ------------------------------------------------------------------- History:   PMH:  Bilateral carotid bruits. Hypothyroidism. Upper respiratory infection.  ------------------------------------------------------------------- Study Conclusions  - Left ventricle: The cavity size was normal. Wall thickness was   increased in a pattern of mild LVH. Systolic function was normal.   The estimated ejection fraction was in the range of 55% to 60%. - Mitral valve: There is bileaflet prolapse of the MV MR is   directed anteirorly into the left atrium IT is at least   moderately severe . - Left atrium: The atrium was mildly dilated. - Pulmonary arteries: PA peak pressure: 35 mm Hg (S).  ------------------------------------------------------------------- Study data:  Comparison was made to the study of 09/01/2016.  Study status:  Routine.  Procedure:  The patient reported no pain pre or post test. Transthoracic echocardiography. Image quality was adequate.  Study completion:  There were no complications. Transthoracic echocardiography.  Gomez-mode, complete 2D, spectral Doppler, and color Doppler.  Birthdate:  Patient birthdate: 1945/11/26.  Age:  Patient is 72 yr old.  Sex:  Gender: female. BMI: 28.8 kg/Gomez^2.  Blood pressure:     130/70  Patient status: Outpatient.  Study date:  Study date: 09/20/2017. Study time: 09:34 AM.  Location:  [facility]  -------------------------------------------------------------------  ------------------------------------------------------------------- Left ventricle:  The  cavity size was normal. Wall thickness was increased in a pattern of mild LVH. Systolic function was normal. The estimated ejection fraction was in the range of 55% to 60%.  ------------------------------------------------------------------- Aortic valve:   Mildly thickened leaflets.  Doppler:  There was no regurgitation.  ------------------------------------------------------------------- Aorta:  The aorta was normal, not dilated, and non-diseased.  ------------------------------------------------------------------- Mitral valve:  There is bileaflet prolapse of the MV MR is directed anteirorly into the left atrium IT is at least moderately severe . Doppler:     Peak gradient (D): 5 mm Hg.  ------------------------------------------------------------------- Left atrium:  The atrium was mildly dilated.  ------------------------------------------------------------------- Right ventricle:  The cavity size was normal. Wall thickness was normal. Systolic function was normal.  ------------------------------------------------------------------- Pulmonic valve:    Structurally normal valve.   Cusp separation was normal.  Doppler:  Transvalvular velocity was within the normal range. There was no regurgitation.  ------------------------------------------------------------------- Tricuspid valve:   Structurally normal valve.   Leaflet separation was normal.  Doppler:  Transvalvular velocity was within the normal range. There was no regurgitation.  ------------------------------------------------------------------- Right atrium:  The atrium was normal in size.  ------------------------------------------------------------------- Pericardium:  There was no pericardial effusion.  ------------------------------------------------------------------- Systemic veins: Inferior vena cava: The vessel was normal in size. The respirophasic diameter changes were in the normal range (>=  50%), consistent with normal central venous pressure.  ------------------------------------------------------------------- Post procedure conclusions Ascending Aorta:  - The aorta was normal, not dilated, and non-diseased.  ------------------------------------------------------------------- Measurements   Left ventricle                           Value        Reference  LV ID, ED, PLAX chordal          (  L)     41.7  mm     43 - 52  LV ID, ES, PLAX chordal                  23.8  mm     23 - 38  LV fx shortening, PLAX chordal           43    %      >=29  LV PW thickness, ED                      10.4  mm     ----------  IVS/LV PW ratio, ED              (H)     1.33         <=1.3  LV e&', lateral                           7.51  cm/s   ----------  LV E/e&', lateral                         14.65        ----------  LV e&', medial                            7.4   cm/s   ----------  LV E/e&', medial                          14.86        ----------  LV e&', average                           7.46  cm/s   ----------  LV E/e&', average                         14.76        ----------    Ventricular septum                       Value        Reference  IVS thickness, ED                        13.8  mm     ----------    LVOT                                     Value        Reference  LVOT ID, S                               20    mm     ----------  LVOT area                                3.14  cm^2   ----------    Aortic valve  Value        Reference  Aortic regurg peak velocity              298   cm/s   ----------  Aortic regurg pressure half-time         750   ms     ----------  Aortic regurg peak gradient              36    mm Hg  ----------    Aorta                                    Value        Reference  Aortic root ID, ED                       30    mm     ----------    Left atrium                              Value        Reference  LA ID, A-P, ES                            36    mm     ----------  LA ID/bsa, A-P                           2.03  cm/Gomez^2 <=2.2  LA volume, S                             69.3  ml     ----------  LA volume/bsa, S                         39.2  ml/Gomez^2 ----------  LA volume, ES, 1-p A4C                   62    ml     ----------  LA volume/bsa, ES, 1-p A4C               35    ml/Gomez^2 ----------  LA volume, ES, 1-p A2C                   69.3  ml     ----------  LA volume/bsa, ES, 1-p A2C               39.2  ml/Gomez^2 ----------    Mitral valve                             Value        Reference  Mitral E-wave peak velocity              110   cm/s   ----------  Mitral A-wave peak velocity              79.6  cm/s   ----------  Mitral deceleration time                 187   ms     150 - 230  Mitral peak gradient,  D                  5     mm Hg  ----------  Mitral E/A ratio, peak                   1.4          ----------  Aliasing velocity, MR PISA               38.5  cm/s   ----------  Mitral regurg PISA radius                9     mm     ----------  Mitral regurg VTI, PISA                  98.3  cm     ----------  Mitral ERO, PISA                         0.31  cm^2   ----------  Mitral regurg volume, PISA               30    ml     ----------    Pulmonary arteries                       Value        Reference  PA pressure, S, DP               (H)     35    mm Hg  <=30    Tricuspid valve                          Value        Reference  Tricuspid regurg peak velocity           284   cm/s   ----------  Tricuspid peak RV-RA gradient            32    mm Hg  ----------    Right atrium                             Value        Reference  RA ID, S-I, ES, A4C              (H)     50.2  mm     34 - 49  RA area, ES, A4C                         10.6  cm^2   8.3 - 19.5  RA volume, ES, A/L                       18.3  ml     ----------  RA volume/bsa, ES, A/L                   10.3  ml/Gomez^2 ----------    Systemic veins                            Value        Reference  Estimated CVP  3     mm Hg  ----------    Right ventricle                          Value        Reference  RV pressure, S, DP               (H)     35    mm Hg  <=30  RV s&', lateral, S                        17.5  cm/s   ----------    Pulmonic valve                           Value        Reference  Pulmonic regurg velocity, ED             100   cm/s   ----------  Legend: (L)  and  (H)  mark values outside specified reference range.  ------------------------------------------------------------------- Prepared and Electronically Authenticated by  Dorris Carnes, Gomez.D. 2019-04-29T15:14:05    Transesophageal Echocardiography  Patient:    Jaquasia, Doscher MR #:       [id number] Study Date: 11/09/2017 Gender:     F Age:        47 Height:     154.9 cm Weight:     72.5 kg BSA:        1.79 Gomez^2 Pt. Status: Room:   ADMITTING    Loralie Champagne, Gomez.D.  ATTENDING    Loralie Champagne, Gomez.D.  PERFORMING   Loralie Champagne, Gomez.D.  Joanna Hews [id number]  [id number]    YKDX, IPJ [id number]  SONOGRAPHER  Jannett Celestine, RDCS  cc:  ------------------------------------------------------------------- LV EF: 60%  ------------------------------------------------------------------- History:   PMH:  Mitral Valve Prolapse Evaluation.  Murmur.  ------------------------------------------------------------------- Study Conclusions  - Left ventricle: The cavity size was normal. Wall thickness was   normal. The estimated ejection fraction was 60%. Wall motion was   normal; there were no regional wall motion abnormalities. - Aortic valve: There was no stenosis. There was trivial   regurgitation. - Aorta: Normal caliber aorta with minimal plaque. - Left atrium: The atrium was mildly dilated. No evidence of   thrombus in the atrial cavity or appendage. - Right ventricle: The cavity size was normal. Systolic function   was  normal. - Right atrium: No evidence of thrombus in the atrial cavity or   appendage. - Atrial septum: No ASD/PFO by color doppler. - Tricuspid valve: Peak RV-RA gradient (S): 18 mm Hg. - Impressions: Severe mitral regurgitation in setting of bileaflet   MVP and partial flail P2 scallop.  Impressions:  - Severe mitral regurgitation in setting of bileaflet MVP and   partial flail P2 scallop.  ------------------------------------------------------------------- Study data:   Study status:  Routine.  Consent:  The risks, benefits, and alternatives to the procedure were explained to the patient and informed consent was obtained.  Procedure:  The patient reported no pain pre or post test. Initial setup. The patient was brought to the laboratory. Surface ECG leads were monitored. Sedation. Conscious sedation was administered by cardiology staff. Transesophageal echocardiography. Topical anesthesia was obtained using viscous lidocaine. A transesophageal probe was inserted by the attending cardiologistwithout difficulty. Image quality was adequate.  Study completion:  The patient tolerated the procedure well. There  were no complications.          Diagnostic transesophageal echocardiography.  2D and color Doppler. Birthdate:  Patient birthdate: Sep 01, 1945.  Age:  Patient is 72 yr old.  Sex:  Gender: female.    BMI: 30.2 kg/Gomez^2.  Blood pressure:   118/52  Study date:  Study date: 11/09/2017. Study time: 10:40 AM.  Location:  Endoscopy.  -------------------------------------------------------------------  ------------------------------------------------------------------- Left ventricle:  The cavity size was normal. Wall thickness was normal. The estimated ejection fraction was 60%. Wall motion was normal; there were no regional wall motion abnormalities.  ------------------------------------------------------------------- Aortic valve:   Trileaflet.  Doppler:   There was no  stenosis. There was trivial regurgitation.  ------------------------------------------------------------------- Aorta:  Normal caliber aorta with minimal plaque.  ------------------------------------------------------------------- Mitral valve:  There was bileaflet prolapse of the mitral valve. There was partial flail of the P2 scallop of the posterior leaflet with highly eccentric, anteriorly-directed mitral regurgitation. The regurgitation appeared severe, unable to do PISA calculation because the jet was so eccentric. There was not systolic flow reversal in the pulmonary vein doppler pattern.  Doppler:   There was no evidence for stenosis.  ------------------------------------------------------------------- Left atrium:  The atrium was mildly dilated.  No evidence of thrombus in the atrial cavity or appendage.  ------------------------------------------------------------------- Atrial septum:  No ASD/PFO by color doppler.  ------------------------------------------------------------------- Right ventricle:  The cavity size was normal. Systolic function was normal.  ------------------------------------------------------------------- Pulmonic valve:    Structurally normal valve.   Cusp separation was normal.  ------------------------------------------------------------------- Tricuspid valve:   Doppler:  There was trivial regurgitation.   ------------------------------------------------------------------- Right atrium:  The atrium was normal in size.  No evidence of thrombus in the atrial cavity or appendage.  ------------------------------------------------------------------- Pericardium:  There was no pericardial effusion.   ------------------------------------------------------------------- Post procedure conclusions Ascending Aorta:  - Normal caliber aorta with minimal  plaque.  ------------------------------------------------------------------- Measurements   Tricuspid valve                      Value  Tricuspid regurg peak velocity       212   cm/s  Tricuspid peak RV-RA gradient        18    mm Hg  Legend: (L)  and  (H)  mark values outside specified reference range.  ------------------------------------------------------------------- Prepared and Electronically Authenticated by  Loralie Champagne, Gomez.D. 2019-06-22T15:50:42   RIGHT/LEFT HEART CATH AND CORONARY ANGIOGRAPHY  Conclusion     The left ventricular systolic function is normal.  LV end diastolic pressure is normal.  The left ventricular ejection fraction is 55-65% by visual estimate.  There is severe (4+) mitral regurgitation and moderate mitral valve prolapse.   1. Normal coronary anatomy 2. Normal LV function 3. Mitral valve prolapse with severe MR 3-4+ 4. Normal right heart pressures 5. Normal LV filling pressures  No indication for antiplatelet therapy at this time.   Will refer to Dr. Roxy Manns for consideration of MV repair.    Indications   Non-rheumatic mitral regurgitation [I34.0 (ICD-10-CM)]  Procedural Details/Technique   Technical Details Indication: 72 yo WF with MV prolapse and severe MR  Procedural Details: The right wrist was prepped, draped, and anesthetized with 1% lidocaine. Using the modified Seldinger technique a 6 Fr slender sheath was placed in the right radial artery and a 5 French sheath was placed in the left brachial vein. A Swan-Ganz catheter was used for the right heart catheterization. Standard protocol was followed for recording of right heart pressures and sampling of  oxygen saturations. Fick cardiac output was calculated. Standard Judkins catheters were used for selective coronary angiography and left ventriculography. There were no immediate procedural complications. The patient was transferred to the post catheterization recovery area for  further monitoring.  Contrast: 95 cc   Estimated blood loss <50 mL.  During this procedure the patient was administered the following to achieve and maintain moderate conscious sedation: Versed 1 mg, Fentanyl 25 mcg, while the patient's heart rate, blood pressure, and oxygen saturation were continuously monitored. The period of conscious sedation was 31 minutes, of which I was present face-to-face 100% of this time.  Complications   Complications documented before study signed (01/18/2018 8:44 AM EDT)    No complications were associated with this study.  Documented by Martinique, Maria M, MD - 01/18/2018 8:22 AM EDT    Coronary Findings   Diagnostic  Dominance: Right  Left Main  Vessel was injected. Vessel is normal in caliber. Vessel is angiographically normal.  Left Anterior Descending  Vessel was injected. Vessel is normal in caliber. Vessel is angiographically normal.  Left Circumflex  Vessel was injected. Vessel is normal in caliber. Vessel is angiographically normal.  Right Coronary Artery  Vessel was injected. Vessel is normal in caliber. Vessel is angiographically normal.  Intervention   No interventions have been documented.  Wall Motion   Resting               Left Heart   Left Ventricle The left ventricular size is normal. The left ventricular systolic function is normal. LV end diastolic pressure is normal. The left ventricular ejection fraction is 55-65% by visual estimate. No regional wall motion abnormalities.  Mitral Valve There is severe (4+) mitral regurgitation and moderate mitral valve prolapse.  Coronary Diagrams   Diagnostic Diagram       Implants    No implant documentation for this case.  MERGE Images   Show images for CARDIAC CATHETERIZATION   Link to Procedure Log   Procedure Log    Hemo Data    Most Recent Value  Fick Cardiac Output 4.95 L/min  Fick Cardiac Output Index 2.89 (L/min)/BSA  RA A Wave 7 mmHg  RA V Wave 8 mmHg  RA  Mean 7 mmHg  RV Systolic Pressure 29 mmHg  RV Diastolic Pressure 3 mmHg  RV EDP 11 mmHg  PA Systolic Pressure 28 mmHg  PA Diastolic Pressure 9 mmHg  PA Mean 16 mmHg  PW A Wave 16 mmHg  PW V Wave 22 mmHg  PW Mean 18 mmHg  AO Systolic Pressure 474 mmHg  AO Diastolic Pressure 62 mmHg  AO Mean 82 mmHg  LV Systolic Pressure 259 mmHg  LV Diastolic Pressure 4 mmHg  LV EDP 14 mmHg  AOp Systolic Pressure 563 mmHg  AOp Diastolic Pressure 51 mmHg  AOp Mean Pressure 70 mmHg  LVp Systolic Pressure 875 mmHg  LVp Diastolic Pressure 5 mmHg  LVp EDP Pressure 19 mmHg  QP/QS 1  TPVR Index 5.54 HRUI  TSVR Index 28.36 HRUI  TPVR/TSVR Ratio 0.2     Impression:  Patient has mitral valve prolapse with stage D severe symptomatic primary mitral regurgitation.  I have personally reviewed the patient's most recent transthoracic echocardiogram, transesophageal echocardiogram, and diagnostic cardiac catheterization.  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  There are clearly ruptured chordae tendinae.  There is severe mitral regurgitation.  Left ventricular size and systolic function remain normal.  Diagnostic cardiac catheterization confirmed the presence of severe mitral regurgitation and revealed normal coronary arteries with no significant coronary artery disease.  Right heart pressures were normal.  Based upon review of the patient's TEE I feel that risks associated with surgery should be low and the likelihood of successful and durable repair greater than 95%.  The patient appears to be relatively good candidate for minimally invasive approach for surgery.   Plan:  The patient was counseled at length regarding the indications, risks and potential benefits of mitral valve repair.  The rationale for elective surgery has been explained, including a  comparison between surgery and continued medical therapy with close follow-up.  The likelihood of successful and durable valve repair has been discussed with particular reference to the findings of their recent echocardiogram.  Based upon these findings and previous experience, I have quoted her a greater than 95 percent likelihood of successful valve repair.  The patient understands and accepts all potential risks of surgery including but not limited to risk of death, stroke or other neurologic complication, myocardial infarction, congestive heart failure, respiratory failure, renal failure, bleeding requiring transfusion and/or reexploration, arrhythmia, infection or other wound complications, pneumonia, pleural and/or pericardial effusion, pulmonary embolus, aortic dissection or other major vascular complication, or delayed complications related to valve repair or replacement including but not limited to structural valve deterioration and failure, thrombosis, embolization, endocarditis, or paravalvular leak.  Alternative surgical approaches have been discussed including a comparison between conventional sternotomy and minimally-invasive techniques.  The relative risks and benefits of each have been reviewed as they pertain to the patient's specific circumstances, and all of their questions have been addressed.  Specific risks potentially related to the minimally-invasive approach were discussed at length, including but not limited to risk of conversion to full or partial sternotomy, aortic dissection or other major vascular complication, unilateral acute lung injury or pulmonary edema, phrenic nerve dysfunction or paralysis, rib fracture, chronic pain, lung hernia, or lymphocele.  Expectations for the patient's postoperative convalescence have been discussed in detail.  All of her questions have been answered.  We tentatively plan to proceed with surgery on March 10, 2018.  Prior to that the patient will  undergo CT angiography of the aorta and iliac vessels to evaluate the feasibility of peripheral cannulation for surgery.  Patient will return to our office for follow-up prior to surgery on March 07, 2017.   I spent in excess of 90 minutes during the conduct of this office consultation and >50% of this time involved direct face-to-face encounter with the patient for counseling and/or coordination of their care.    Valentina Gu. Roxy Manns, MD 02/11/2018 10:27 AM

## 2018-02-14 ENCOUNTER — Encounter: Payer: Self-pay | Admitting: *Deleted

## 2018-02-15 DIAGNOSIS — R69 Illness, unspecified: Secondary | ICD-10-CM | POA: Diagnosis not present

## 2018-03-04 NOTE — Progress Notes (Addendum)
PCP:  [name] PA-C  Cardiologist: Peter Martinique  EKG: 03/07/18 -obtained  at PAT appt  Stress test: pt denies   ECHO: 02/11/18  In EPIC  Cardiac Cath: 01/18/18 in EPIC  Chest x-ray: 03/07/18 obtained at PAT appt

## 2018-03-04 NOTE — Pre-Procedure Instructions (Signed)
Maria Gomez  03/04/2018      [id number] [address], [place] [address] Phone: [phone] Fax: [phone]    Your procedure is scheduled on March 10, 2018.  Report to [facility] Admitting at 530 AM.  Call this number if you have problems the morning of surgery:  [phone]   Remember:  Do not eat or drink after midnight.    Take these medicines the morning of surgery with A SIP OF WATER  Levothyroxine (synthroid) Omeprazole (prilosec) Eye drops-if needed  7 days prior to surgery STOP taking any Aspirin (unless otherwise instructed by your surgeon), Aleve, Naproxen, Ibuprofen, Motrin, Advil, Goody's, BC's, all herbal medications, fish oil, and all vitamins    Do not wear jewelry, make-up or nail polish.  Do not wear lotions, powders, or perfumes, or deodorant.  Do not shave 48 hours prior to surgery.    Do not bring valuables to the hospital.   [facility] is not responsible for any belongings or valuables.  Contacts, dentures or bridgework may not be worn into surgery.  Leave your suitcase in the car.  After surgery it may be brought to your room.  For patients admitted to the hospital, discharge time will be determined by your treatment team.  Patients discharged the day of surgery will not be allowed to drive home.    [facility]- Preparing For Surgery  Before surgery, you can play an important role. Because skin is not sterile, your skin needs to be as free of germs as possible. You can reduce the number of germs on your skin by washing with CHG (chlorahexidine gluconate) Soap before surgery.  CHG is an antiseptic cleaner which kills germs and bonds with the skin to continue killing germs even after washing.    Oral Hygiene is also important to reduce your risk of infection.  Remember - BRUSH YOUR TEETH THE MORNING OF SURGERY WITH YOUR REGULAR TOOTHPASTE  Please do not use  if you have an allergy to CHG or antibacterial soaps. If your skin becomes reddened/irritated stop using the CHG.  Do not shave (including legs and underarms) for at least 48 hours prior to first CHG shower. It is OK to shave your face.  Please follow these instructions carefully.   1. Shower the NIGHT BEFORE SURGERY and the MORNING OF SURGERY with CHG.   2. If you chose to wash your hair, wash your hair first as usual with your normal shampoo.  3. After you shampoo, rinse your hair and body thoroughly to remove the shampoo.  4. Use CHG as you would any other liquid soap. You can apply CHG directly to the skin and wash gently with a scrungie or a clean washcloth.   5. Apply the CHG Soap to your body ONLY FROM THE NECK DOWN.  Do not use on open wounds or open sores. Avoid contact with your eyes, ears, mouth and genitals (private parts). Wash Face and genitals (private parts)  with your normal soap.  6. Wash thoroughly, paying special attention to the area where your surgery will be performed.  7. Thoroughly rinse your body with warm water from the neck down.  8. DO NOT shower/wash with your normal soap after using and rinsing off the CHG Soap.  9. Pat yourself dry with a CLEAN TOWEL.  10. Wear CLEAN PAJAMAS to bed the night before surgery, wear comfortable clothes the morning of surgery  11. Place CLEAN SHEETS on your bed the night of your first shower and DO NOT SLEEP WITH PETS.  Day of Surgery:  Do not apply any deodorants/lotions.  Please wear clean clothes to the hospital/surgery center.   Remember to brush your teeth WITH YOUR REGULAR TOOTHPASTE.   Please read over the following fact sheets that you were given. Pain Booklet, Coughing and Deep Breathing, MRSA Information and Surgical Site Infection Prevention

## 2018-03-07 ENCOUNTER — Other Ambulatory Visit (HOSPITAL_COMMUNITY): Payer: Medicare HMO

## 2018-03-07 ENCOUNTER — Ambulatory Visit: Payer: Medicare HMO | Admitting: Thoracic Surgery (Cardiothoracic Vascular Surgery)

## 2018-03-07 ENCOUNTER — Ambulatory Visit
Admission: RE | Admit: 2018-03-07 | Discharge: 2018-03-07 | Disposition: A | Discharge status: Home / Self Care | Payer: Medicare HMO | Source: Ambulatory Visit | Attending: Thoracic Surgery (Cardiothoracic Vascular Surgery) | Admitting: Thoracic Surgery (Cardiothoracic Vascular Surgery)

## 2018-03-07 ENCOUNTER — Encounter (HOSPITAL_COMMUNITY)
Admission: RE | Admit: 2018-03-07 | Discharge: 2018-03-07 | Disposition: A | Discharge status: Home / Self Care | Payer: Medicare HMO | Source: Ambulatory Visit | Attending: Thoracic Surgery (Cardiothoracic Vascular Surgery) | Admitting: Thoracic Surgery (Cardiothoracic Vascular Surgery)

## 2018-03-07 ENCOUNTER — Ambulatory Visit (HOSPITAL_COMMUNITY)
Admission: RE | Admit: 2018-03-07 | Discharge: 2018-03-07 | Disposition: A | Discharge status: Home / Self Care | Payer: Medicare HMO | Source: Ambulatory Visit | Attending: Thoracic Surgery (Cardiothoracic Vascular Surgery) | Admitting: Thoracic Surgery (Cardiothoracic Vascular Surgery)

## 2018-03-07 ENCOUNTER — Other Ambulatory Visit: Payer: Self-pay

## 2018-03-07 ENCOUNTER — Ambulatory Visit (HOSPITAL_BASED_OUTPATIENT_CLINIC_OR_DEPARTMENT_OTHER)
Admission: RE | Admit: 2018-03-07 | Discharge: 2018-03-07 | Disposition: A | Discharge status: Home / Self Care | Payer: Medicare HMO | Source: Ambulatory Visit | Attending: Thoracic Surgery (Cardiothoracic Vascular Surgery) | Admitting: Thoracic Surgery (Cardiothoracic Vascular Surgery)

## 2018-03-07 ENCOUNTER — Encounter (HOSPITAL_COMMUNITY): Payer: Self-pay

## 2018-03-07 ENCOUNTER — Encounter: Payer: Self-pay | Admitting: Thoracic Surgery (Cardiothoracic Vascular Surgery)

## 2018-03-07 VITALS — BP 132/80 | HR 99 | Resp 18 | Ht 61.0 in | Wt 163.0 lb

## 2018-03-07 DIAGNOSIS — I491 Atrial premature depolarization: Secondary | ICD-10-CM

## 2018-03-07 DIAGNOSIS — M810 Age-related osteoporosis without current pathological fracture: Secondary | ICD-10-CM | POA: Diagnosis not present

## 2018-03-07 DIAGNOSIS — J9811 Atelectasis: Secondary | ICD-10-CM | POA: Diagnosis not present

## 2018-03-07 DIAGNOSIS — I7409 Other arterial embolism and thrombosis of abdominal aorta: Secondary | ICD-10-CM

## 2018-03-07 DIAGNOSIS — I340 Nonrheumatic mitral (valve) insufficiency: Secondary | ICD-10-CM | POA: Diagnosis not present

## 2018-03-07 DIAGNOSIS — E041 Nontoxic single thyroid nodule: Secondary | ICD-10-CM

## 2018-03-07 DIAGNOSIS — K449 Diaphragmatic hernia without obstruction or gangrene: Secondary | ICD-10-CM | POA: Diagnosis not present

## 2018-03-07 DIAGNOSIS — Z01818 Encounter for other preprocedural examination: Secondary | ICD-10-CM

## 2018-03-07 DIAGNOSIS — D62 Acute posthemorrhagic anemia: Secondary | ICD-10-CM | POA: Diagnosis not present

## 2018-03-07 DIAGNOSIS — E039 Hypothyroidism, unspecified: Secondary | ICD-10-CM | POA: Diagnosis not present

## 2018-03-07 DIAGNOSIS — I511 Rupture of chordae tendineae, not elsewhere classified: Secondary | ICD-10-CM | POA: Diagnosis not present

## 2018-03-07 DIAGNOSIS — I7101 Dissection of thoracic aorta: Secondary | ICD-10-CM

## 2018-03-07 DIAGNOSIS — I71019 Dissection of thoracic aorta, unspecified: Secondary | ICD-10-CM

## 2018-03-07 DIAGNOSIS — Z79899 Other long term (current) drug therapy: Secondary | ICD-10-CM | POA: Diagnosis not present

## 2018-03-07 DIAGNOSIS — R112 Nausea with vomiting, unspecified: Secondary | ICD-10-CM | POA: Diagnosis not present

## 2018-03-07 DIAGNOSIS — Z888 Allergy status to other drugs, medicaments and biological substances status: Secondary | ICD-10-CM | POA: Diagnosis not present

## 2018-03-07 DIAGNOSIS — K429 Umbilical hernia without obstruction or gangrene: Secondary | ICD-10-CM | POA: Diagnosis not present

## 2018-03-07 DIAGNOSIS — Z954 Presence of other heart-valve replacement: Secondary | ICD-10-CM | POA: Diagnosis not present

## 2018-03-07 DIAGNOSIS — E876 Hypokalemia: Secondary | ICD-10-CM | POA: Diagnosis not present

## 2018-03-07 HISTORY — DX: Nonrheumatic mitral (valve) insufficiency: I34.0

## 2018-03-07 HISTORY — DX: Nontoxic single thyroid nodule: E04.1

## 2018-03-07 LAB — HEMOGLOBIN A1C
Hgb A1c MFr Bld: 5.5 % (ref 4.8–5.6)
MEAN PLASMA GLUCOSE: 111.15 mg/dL

## 2018-03-07 LAB — PULMONARY FUNCTION TEST
DL/VA % PRED: 110 %
DL/VA: 4.85 ml/min/mmHg/L
DLCO COR % PRED: 86 %
DLCO cor: 17.46 ml/min/mmHg
DLCO unc % pred: 87 %
DLCO unc: 17.72 ml/min/mmHg
FEF 25-75 POST: 2.13 L/s
FEF 25-75 Pre: 1.82 L/sec
FEF2575-%CHANGE-POST: 16 %
FEF2575-%PRED-POST: 131 %
FEF2575-%Pred-Pre: 112 %
FEV1-%CHANGE-POST: 3 %
FEV1-%PRED-POST: 97 %
FEV1-%Pred-Pre: 94 %
FEV1-PRE: 1.81 L
FEV1-Post: 1.87 L
FEV1FVC-%Change-Post: 1 %
FEV1FVC-%PRED-PRE: 108 %
FEV6-%Change-Post: 0 %
FEV6-%PRED-POST: 91 %
FEV6-%Pred-Pre: 91 %
FEV6-Post: 2.23 L
FEV6-Pre: 2.22 L
FEV6FVC-%Change-Post: -1 %
FEV6FVC-%PRED-PRE: 105 %
FEV6FVC-%Pred-Post: 104 %
FVC-%CHANGE-POST: 1 %
FVC-%PRED-POST: 88 %
FVC-%PRED-PRE: 86 %
FVC-POST: 2.26 L
FVC-Pre: 2.22 L
POST FEV1/FVC RATIO: 83 %
PRE FEV6/FVC RATIO: 100 %
Post FEV6/FVC ratio: 99 %
Pre FEV1/FVC ratio: 82 %
RV % PRED: 175 %
RV: 3.66 L
TLC % pred: 130 %
TLC: 5.98 L

## 2018-03-07 LAB — COMPREHENSIVE METABOLIC PANEL
ALK PHOS: 53 U/L (ref 38–126)
ALT: 14 U/L (ref 0–44)
ANION GAP: 10 (ref 5–15)
AST: 18 U/L (ref 15–41)
Albumin: 3.8 g/dL (ref 3.5–5.0)
BILIRUBIN TOTAL: 0.7 mg/dL (ref 0.3–1.2)
BUN: 16 mg/dL (ref 8–23)
CALCIUM: 9.8 mg/dL (ref 8.9–10.3)
CO2: 21 mmol/L — ABNORMAL LOW (ref 22–32)
Chloride: 106 mmol/L (ref 98–111)
Creatinine, Ser: 0.98 mg/dL (ref 0.44–1.00)
GFR, EST NON AFRICAN AMERICAN: 56 mL/min — AB (ref >60)
Glucose, Bld: 124 mg/dL — ABNORMAL HIGH (ref 70–99)
Potassium: 3.9 mmol/L (ref 3.5–5.1)
Sodium: 137 mmol/L (ref 135–145)
TOTAL PROTEIN: 6.5 g/dL (ref 6.5–8.1)

## 2018-03-07 LAB — URINALYSIS, ROUTINE W REFLEX MICROSCOPIC
Bacteria, UA: NONE SEEN
Bilirubin Urine: NEGATIVE
GLUCOSE, UA: NEGATIVE mg/dL
Ketones, ur: NEGATIVE mg/dL
Leukocytes, UA: NEGATIVE
Nitrite: NEGATIVE
Protein, ur: NEGATIVE mg/dL
Specific Gravity, Urine: 1.002 — ABNORMAL LOW (ref 1.005–1.030)
pH: 7 (ref 5.0–8.0)

## 2018-03-07 LAB — SURGICAL PCR SCREEN
MRSA, PCR: NEGATIVE
STAPHYLOCOCCUS AUREUS: NEGATIVE

## 2018-03-07 LAB — CBC
HEMATOCRIT: 43.1 % (ref 36.0–46.0)
HEMOGLOBIN: 13.9 g/dL (ref 12.0–15.0)
MCH: 30.2 pg (ref 26.0–34.0)
MCHC: 32.3 g/dL (ref 30.0–36.0)
MCV: 93.7 fL (ref 80.0–100.0)
Platelets: 239 10*3/uL (ref 150–400)
RBC: 4.6 MIL/uL (ref 3.87–5.11)
RDW: 12.5 % (ref 11.5–15.5)
WBC: 7.9 10*3/uL (ref 4.0–10.5)
nRBC: 0 % (ref 0.0–0.2)

## 2018-03-07 LAB — BLOOD GAS, ARTERIAL
Acid-Base Excess: 0.5 mmol/L (ref 0.0–2.0)
BICARBONATE: 23.7 mmol/L (ref 20.0–28.0)
Drawn by: 421801
FIO2: 21
O2 Saturation: 98.8 %
PATIENT TEMPERATURE: 98.6
PH ART: 7.477 — AB (ref 7.350–7.450)
PO2 ART: 122 mmHg — AB (ref 83.0–108.0)
pCO2 arterial: 32.4 mmHg (ref 32.0–48.0)

## 2018-03-07 LAB — APTT: aPTT: 29 seconds (ref 24–36)

## 2018-03-07 LAB — PROTIME-INR
INR: 0.96
PROTHROMBIN TIME: 12.7 s (ref 11.4–15.2)

## 2018-03-07 LAB — ABO/RH: ABO/RH(D): O POS

## 2018-03-07 MED ORDER — ALBUTEROL SULFATE (2.5 MG/3ML) 0.083% IN NEBU
2.5000 mg | INHALATION_SOLUTION | Freq: Once | RESPIRATORY_TRACT | Status: AC
  Administered 2018-03-07: 2.5 mg via RESPIRATORY_TRACT

## 2018-03-07 MED ORDER — IOPAMIDOL (ISOVUE-370) INJECTION 76%
75.0000 mL | Freq: Once | INTRAVENOUS | Status: AC | PRN
  Administered 2018-03-07: 75 mL via INTRAVENOUS

## 2018-03-07 NOTE — Progress Notes (Signed)
Maria Gomez       [place] [address]             [phone]     CARDIOTHORACIC SURGERY OFFICE NOTE  Referring Provider is Martinique, Peter M, MD PCP is Orlena Sheldon, PA-C   HPI:  Patient returns to the office today for follow-up of mitral valve prolapse with severe symptomatic primary mitral regurgitation with tentative plans to proceed with elective mitral valve repair later this week.  She was originally seen in consultation on February 11, 2018.  She reports no new problems or complaints.   Current Outpatient Medications  Medication Sig Dispense Refill  . alendronate (FOSAMAX) 70 MG tablet Take 1 tablet (70 mg total) by mouth every 7 (seven) days. Take with a full glass of water on an empty stomach.Sit upright for 30 minutes after taking 12 tablet 3  . Biotin 5000 MCG TABS Take 5,000 mcg by mouth daily.     . cetirizine (ZYRTEC) 10 MG tablet Take 1 tablet (10 mg total) by mouth daily. (Patient taking differently: Take 10 mg by mouth daily as needed for allergies. ) 90 tablet 0  . Coenzyme Q10 (COQ10) 200 MG CAPS Take 200 mg by mouth daily.     Marland Kitchen docusate sodium (COLACE) 100 MG capsule Take 100 mg by mouth daily as needed for mild constipation or moderate constipation.    Marland Kitchen levothyroxine (SYNTHROID, LEVOTHROID) 25 MCG tablet TAKE 1 TABLET EVERY DAY BEFORE BREAKFAST (Patient taking differently: Take 25 mcg by mouth daily before breakfast. TAKE 1 TABLET EVERY DAY BEFORE BREAKFAST) 90 tablet 2  . Melatonin 5 MG CAPS Take 10 mg by mouth at bedtime.    . Multiple Vitamin (MULTIVITAMIN) tablet Take 1 tablet by mouth daily. Centrum Silver    . omeprazole (PRILOSEC) 20 MG capsule Take 20 mg by mouth daily as needed (acid reflux).    . Probiotic Product (PROBIOTIC DAILY PO) Take 1 capsule by mouth daily. Florify    . Propylene Glycol (SYSTANE COMPLETE) 0.6 % SOLN Place 1 drop into both eyes 2 (two) times daily as needed (dry eyes).     . vitamin B-12 (CYANOCOBALAMIN)  1000 MCG tablet Take 1,000 mcg by mouth daily.    Marland Kitchen zinc gluconate 50 MG tablet Take 50 mg by mouth daily.     No current facility-administered medications for this visit.       Physical Exam:   BP 132/80 (BP Location: Left Arm, Patient Position: Sitting, Cuff Size: Normal)   Pulse 99   Resp 18   Ht 5\' 1"  (1.549 m)   Wt 163 lb (73.9 kg)   SpO2 100% Comment: RA  BMI 30.80 kg/m   General:  Well-appearing  Chest:   Clear to auscultation  CV:   Regular rate and rhythm with prominent holosystolic murmur  Incisions:  n/a  Abdomen:  Soft nontender  Extremities:  Warm and well-perfused  Diagnostic Tests:  CT ANGIOGRAPHY CHEST, ABDOMEN AND PELVIS  TECHNIQUE: Multidetector CT imaging through the chest, abdomen and pelvis was performed using the standard protocol during bolus administration of intravenous contrast. Multiplanar reconstructed images and MIPs were obtained and reviewed to evaluate the vascular anatomy.  CONTRAST:  48mL ISOVUE-370 IOPAMIDOL (ISOVUE-370) INJECTION 76%  COMPARISON:  None.  FINDINGS: CTA CHEST FINDINGS  Cardiovascular:  Heart:  No pericardial fluid/thickening. No significant calcifications of the aortic valve or mitral annulus. No significant coronary calcifications.  Aorta:  Unremarkable course, caliber, contour  of the thoracic aorta. No aneurysm or dissection flap. No periaortic fluid.  Pulmonary arteries:  No central filling defects of the main pulmonary artery, lobar arteries, proximal segmental arteries. Main pulmonary artery diameter measures 2.4 cm.  Mediastinum/Nodes: Heterogeneous nodule of the left thyroid the extension towards the thoracic inlet. Diameter on the axial images measures 2.9 cm and the diameter on the coronal images measures 3.0 cm. Hiatal hernia.  No mediastinal adenopathy with small lymph nodes present.  Lungs/Pleura: Central airways are clear. No pleural effusion. No confluent airspace  disease.  No pneumothorax.  Mild pattern of geographic ground-glass opacity.  Musculoskeletal: No acute displaced fracture. Degenerative changes of the spine.  Review of the MIP images confirms the above findings.  CTA ABDOMEN AND PELVIS FINDINGS  VASCULAR  Aorta: Unremarkable course, caliber, contour of the abdominal aorta. No dissection, aneurysm, or periaortic fluid.  Celiac: Celiac artery patent with typical branch pattern. No significant atherosclerosis.  SMA: SMA patent without significant atherosclerosis.  Renals: Bilateral renal arteries are patent without significant atherosclerosis.  IMA: IMA patent.  Right lower extremity:  Unremarkable course, caliber, and contour of the right iliac system. No aneurysm, dissection, or occlusion. Hypogastric artery is patent. Anterior and posterior division patent. Common femoral artery patent. Proximal SFA and profunda femoris patent.  Left lower extremity:  Unremarkable course, caliber, and contour of the left iliac system. No aneurysm, dissection, or occlusion. Hypogastric artery is patent. Anterior and posterior division patent. Common femoral artery patent. Proximal SFA and profunda femoris patent.  Veins: Unremarkable appearance of the venous system.  Review of the MIP images confirms the above findings.  NON-VASCULAR  Hepatobiliary: Unremarkable appearance of the liver. Unremarkable gall bladder.  Pancreas: Unremarkable pancreas  Spleen: Unremarkable  Adrenals/Urinary Tract: Unremarkable appearance of adrenal glands.  Right:  No hydronephrosis. Symmetric perfusion to the left. No nephrolithiasis. Unremarkable course of the right ureter.  Left:  No hydronephrosis. Symmetric perfusion to the right. No nephrolithiasis. Unremarkable course of the left ureter.  Unremarkable appearance of the urinary bladder .  Stomach/Bowel: Hiatal hernia. Otherwise unremarkable  stomach. Unremarkable appearance of small bowel. No evidence of obstruction. Normal appendix colonic diverticular disease without evidence of acute diverticulitis.  Lymphatic: No lymphadenopathy  Mesenteric: No free fluid or air. No adenopathy.  Reproductive: Unremarkable uterus and adnexa  Other: Small fat containing umbilical hernia  Musculoskeletal: No acute displaced fracture. Degenerative changes of the lumbar spine most pronounced at L5-S1.  IMPRESSION: No acute CT finding.  No significant atherosclerotic vascular disease.  Incidental left thyroid nodule measuring 2.9 cm, poorly characterized by CT. Dedicated thyroid ultrasound may be considered for more specific evaluation.  Mild pattern of geographic ground-glass opacity throughout the lungs, most commonly seen with small airway disease/air trapping.  Hiatal hernia.  Diverticular disease without evidence of acute diverticulitis.  Signed,  Dulcy Fanny. Dellia Nims, RPVI  [facility]  [facility]   Electronically Signed   By: Corrie Mckusick D.O.   On: 03/07/2018 16:01    Impression:  Patient has mitral valve prolapse with stage D severe symptomatic primary mitral regurgitation.  I have personally reviewed the patient's most recent transthoracic echocardiogram, transesophageal echocardiogram, and diagnostic cardiac catheterization.  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  There are clearly ruptured chordae tendinae.  There is severe mitral regurgitation.  Left ventricular size and systolic function remain normal.  Diagnostic cardiac catheterization confirmed the presence of severe mitral regurgitation and revealed normal coronary arteries with no significant coronary artery disease.  Right heart  pressures were normal.  Based upon review of the patient's TEE I feel that risks associated with surgery should be low and the likelihood of successful and durable repair greater than 95%.  The patient appears to be relatively good candidate for minimally invasive approach for surgery.  CT angiography reveals no contraindication to peripheral cannulation for surgery.  The patient was noted to have a small thyroid nodule on CT scan.    Plan:  The patient was again counseled at length regarding the indications, risks and potential benefits of mitral valve repair.  The rationale for elective surgery has been explained, including a comparison between surgery and continued medical therapy with close follow-up.  The likelihood of successful and durable valve repair has been discussed with particular reference to the findings of their recent echocardiogram.  Based upon these findings and previous experience, I have quoted her a greater than 95 percent likelihood of successful valve repair.  The patient understands and accepts all potential risks of surgery including but not limited to risk of death, stroke or other neurologic complication, myocardial infarction, congestive heart failure, respiratory failure, renal failure, bleeding requiring transfusion and/or reexploration, arrhythmia, infection or other wound complications, pneumonia, pleural and/or pericardial effusion, pulmonary embolus, aortic dissection or other major vascular complication, or delayed complications related to valve repair or replacement including but not limited to structural valve deterioration and failure, thrombosis, embolization, endocarditis, or paravalvular leak.  Alternative surgical approaches have been discussed including a comparison between conventional sternotomy and minimally-invasive techniques.  The relative risks and benefits of each have been reviewed as they pertain to the patient's specific circumstances, and all of their  questions have been addressed.  Specific risks potentially related to the minimally-invasive approach were discussed at length, including but not limited to risk of conversion to full or partial sternotomy, aortic dissection or other major vascular complication, unilateral acute lung injury or pulmonary edema, phrenic nerve dysfunction or paralysis, rib fracture, chronic pain, lung hernia, or lymphocele.  Expectations for the patient's postoperative convalescence have been discussed in detail.    The small nodule noted on CT scan was discussed and the possibility of elective thyroid ultrasound in the future was discussed.  All of her questions have been answered.  I spent in excess of 15 minutes during the conduct of this office consultation and >50% of this time involved direct face-to-face encounter with the patient for counseling and/or coordination of their care.    Valentina Gu. Roxy Manns, MD 03/07/2018 4:11 PM

## 2018-03-07 NOTE — Progress Notes (Signed)
Pre-MVR testing has been completed. 1-39% ICA stenosis bilaterally.  Palmar waveforms Right Palmar waveforms are obliterated with radial and ulnar compression. Left Palmar waveforms are obliterated with radial and ulnar compression.  03/07/18 2:40 PM Maria Gomez RVT

## 2018-03-07 NOTE — H&P (Signed)
[address]       ,[place] [address]             [phone]          CARDIOTHORACIC SURGERY HISTORY AND PHYSICAL EXAM  Referring Provider is Martinique, Peter M, MD PCP is Orlena Sheldon, PA-C      Chief Complaint  Patient presents with  . Mitral Regurgitation    Surgical eval, Cardiac Cath 01/18/18, TEE 11/09/17, ECHO 09/20/17    HPI:  Patient is a 72 year old female who has been referred for surgical consultation to discuss treatment options for management of mitral valve prolapse with stage D severe symptomatic primary mitral regurgitation.  Patient has history of hypothyroidism and mild osteopenia but essentially has been healthy essentially all of her adult life.  She was recently noted to have a systolic murmur on physical exam and was referred for cardiology consultation.  She was initially evaluated by Almyra Deforest and more recently by Dr. Martinique.  Transthoracic echocardiogram performed September 20, 2017 revealed normal left ventricular systolic function with ejection fraction estimated 55 to 60%.  There was mitral valve prolapse with at least moderately severe mitral regurgitation.  Transesophageal echocardiogram performed November 09, 2017 confirmed the presence of bileaflet prolapse of the mitral valve with partially flail segment involving the middle scallop of the posterior leaflet.  There was felt to be severe mitral regurgitation although quantification using PISA was not possible because of the extremely eccentric jet of regurgitation.  There was mild left atrial enlargement and normal left ventricular size and systolic function.  No other significant abnormalities were noted.  Diagnostic cardiac catheterization was performed January 18, 2018.  The patient was found to have normal coronary arteries with no significant coronary artery disease.  There was severe mitral regurgitation with normal right heart pressures and normal left heart filling pressures.   Cardiothoracic surgical consultation was requested.  Patient is widowed and lives with her elderly mother in [place].  She has remained healthy and physically active all of her adult life.  She is a retired Radio producer.  She looks after her mother who suffers from dementia.  She takes care of her entire house and performs chores around the house including mowing 10 acres of grass every week.  She complains that over the past year she has developed progressive decline in her exercise tolerance.  She gets fatigued much more easily than she used to.  She has occasional palpitations.  She does not experience much shortness of breath although she does note a dry persistent cough.  She denies any history of PND, orthopnea, or lower extremity edema.  She has not had any chest pain or chest tightness either with activity or at rest  Patient returns to the office today for follow-up of mitral valve prolapse with severe symptomatic primary mitral regurgitation with tentative plans to proceed with elective mitral valve repair later this week.  She was originally seen in consultation on February 11, 2018.  She reports no new problems or complaints.   Past Medical History:  Diagnosis Date  . Hypothyroidism   . Mitral regurgitation   . Osteopenia   . Thyroid nodule 03/07/2018   Incidental - noted on CT scan    Past Surgical History:  Procedure Laterality Date  . BREAST EXCISIONAL BIOPSY Bilateral   . RIGHT/LEFT HEART CATH AND CORONARY ANGIOGRAPHY N/A 01/18/2018   Procedure: RIGHT/LEFT HEART CATH AND CORONARY ANGIOGRAPHY;  Surgeon: Martinique, Peter M,  MD;  Location: [facility];  Service: Cardiovascular;  Laterality: N/A;  . TEE WITHOUT CARDIOVERSION N/A 11/09/2017   Procedure: TRANSESOPHAGEAL ECHOCARDIOGRAM (TEE);  Surgeon: Larey Dresser, MD;  Location: [facility];  Service: Cardiovascular;  Laterality: N/A;    Family History  Problem Relation Age of Onset  . Heart disease Father   . Cancer  Brother 33       ColoRectal Cancer  . Cancer Maternal Grandmother 43  . Stroke Maternal Grandfather 49  . Stroke Paternal Grandfather     Social History Social History   Tobacco Use  . Smoking status: Never Smoker  . Smokeless tobacco: Never Used  Substance Use Topics  . Alcohol use: No  . Drug use: No    Prior to Admission medications   Medication Sig Start Date End Date Taking? Authorizing Provider  alendronate (FOSAMAX) 70 MG tablet Take 1 tablet (70 mg total) by mouth every 7 (seven) days. Take with a full glass of water on an empty stomach.Sit upright for 30 minutes after taking 09/28/17  Yes Orlena Sheldon, PA-C  Biotin 5000 MCG TABS Take 5,000 mcg by mouth daily.    Yes [provider]  cetirizine (ZYRTEC) 10 MG tablet Take 1 tablet (10 mg total) by mouth daily. Patient taking differently: Take 10 mg by mouth daily as needed for allergies.  08/23/17  Yes Orlena Sheldon, PA-C  Coenzyme Q10 (COQ10) 200 MG CAPS Take 200 mg by mouth daily.    Yes [provider]  docusate sodium (COLACE) 100 MG capsule Take 100 mg by mouth daily as needed for mild constipation or moderate constipation.   Yes [provider]  levothyroxine (SYNTHROID, LEVOTHROID) 25 MCG tablet TAKE 1 TABLET EVERY DAY BEFORE BREAKFAST Patient taking differently: Take 25 mcg by mouth daily before breakfast. TAKE 1 TABLET EVERY DAY BEFORE BREAKFAST 08/23/17  Yes Orlena Sheldon, PA-C  Melatonin 5 MG CAPS Take 10 mg by mouth at bedtime.   Yes [provider]  Multiple Vitamin (MULTIVITAMIN) tablet Take 1 tablet by mouth daily. Centrum Silver   Yes [provider]  omeprazole (PRILOSEC) 20 MG capsule Take 20 mg by mouth daily as needed (acid reflux).   Yes [provider]  Probiotic Product (PROBIOTIC DAILY PO) Take 1 capsule by mouth daily. Florify   Yes [provider]  Propylene Glycol (SYSTANE COMPLETE) 0.6 % SOLN Place 1 drop into both eyes 2 (two) times daily as  needed (dry eyes).    Yes [provider]  vitamin B-12 (CYANOCOBALAMIN) 1000 MCG tablet Take 1,000 mcg by mouth daily.   Yes [provider]  zinc gluconate 50 MG tablet Take 50 mg by mouth daily.   Yes [provider]    Allergies  Allergen Reactions  . Niaspan [Niacin Er] Other (See Comments)    Severe flushing  . Phenazopyridine Hcl Other (See Comments)    Made sick   . Statins     Severe flushing     Review of Systems:              General:                      normal appetite, decreased energy, no weight gain, no weight loss, no fever             Cardiac:  no chest pain with exertion, no chest pain at rest, + mild SOB with exertion, no resting SOB, no PND, no orthopnea, + palpitations, no arrhythmia, no atrial fibrillation, no LE edema, no dizzy spells, no syncope             Respiratory:                 no shortness of breath, no home oxygen, no productive cough, + dry cough, no bronchitis, no wheezing, no hemoptysis, no asthma, no pain with inspiration or cough, no sleep apnea, no CPAP at night             GI:                               no difficulty swallowing, no reflux, + frequent heartburn, no hiatal hernia, no abdominal pain, + constipation, no diarrhea, no hematochezia, no hematemesis, no melena             GU:                              no dysuria,  + frequency, no urinary tract infection, no hematuria, no kidney stones, no kidney disease             Vascular:                     no pain suggestive of claudication, no pain in feet, no leg cramps, no varicose veins, no DVT, no non-healing foot ulcer             Neuro:                         no stroke, no TIA's, no seizures, no headaches, no temporary blindness one eye,  no slurred speech, no peripheral neuropathy, no chronic pain, no instability of gait, no memory/cognitive dysfunction             Musculoskeletal:         no arthritis, no joint swelling, no myalgias, no  difficulty walking, normal mobility              Skin:                            no rash, no itching, no skin infections, no pressure sores or ulcerations             Psych:                         + anxiety, no depression, no nervousness, no unusual recent stress             Eyes:                           no blurry vision, no floaters, no recent vision changes, + wears glasses or contacts             ENT:                            no hearing loss, no loose or painful teeth, no dentures, last saw dentist 1 year ago but scheduled for cleaning 02/17/2018  Hematologic:               no easy bruising, no abnormal bleeding, no clotting disorder, no frequent epistaxis             Endocrine:                   no diabetes, does not check CBG's at home                                                       Physical Exam:              BP 124/76   Pulse 65   Resp 20   Ht 5\' 1"  (1.549 m)   Wt 160 lb (72.6 kg)   SpO2 98% Comment: RA  BMI 30.23 kg/m              General:                      Mildly obese,  well-appearing             HEENT:                       Unremarkable              Neck:                           no JVD, no bruits, no adenopathy              Chest:                          clear to auscultation, symmetrical breath sounds, no wheezes, no rhonchi              CV:                              RRR, grade III/VI systolic murmur heard best at apex,  no diastolic murmur             Abdomen:                    soft, non-tender, no masses              Extremities:                 warm, well-perfused, pulses diminished, no LE edema             Rectal/GU                   Deferred             Neuro:                         Grossly non-focal and symmetrical throughout             Skin:                            Clean and dry, no rashes, no breakdown   Diagnostic Tests:  Transthoracic Echocardiography  Patient: Diella, Gillingham  W MR #:  [id number] Study Date: 09/20/2017 Gender: F Age: 89 Height: 156.2 cm Weight: 70.3 kg BSA: 1.77 m^2 Pt. Status: Room:  SONOGRAPHER Diamond Nickel ATTENDING Sanda Klein, MD Layla Maw REFERRING [name] B PERFORMING [facility]  cc:  ------------------------------------------------------------------- LV EF: 55% - 60%  ------------------------------------------------------------------- Indications: R01.1 Murmur.  ------------------------------------------------------------------- History: PMH: Bilateral carotid bruits. Hypothyroidism. Upper respiratory infection.  ------------------------------------------------------------------- Study Conclusions  - Left ventricle: The cavity size was normal. Wall thickness was increased in a pattern of mild LVH. Systolic function was normal. The estimated ejection fraction was in the range of 55% to 60%. - Mitral valve: There is bileaflet prolapse of the MV MR is directed anteirorly into the left atrium IT is at least moderately severe . - Left atrium: The atrium was mildly dilated. - Pulmonary arteries: PA peak pressure: 35 mm Hg (S).  ------------------------------------------------------------------- Study data: Comparison was made to the study of 09/01/2016. Study status: Routine. Procedure: The patient reported no pain pre or post test. Transthoracic echocardiography. Image quality was adequate. Study completion: There were no complications. Transthoracic echocardiography. M-mode, complete 2D, spectral Doppler, and color Doppler. Birthdate: Patient birthdate: 11-12-1945. Age: Patient is 72 yr old. Sex: Gender: female. BMI: 28.8 kg/m^2. Blood pressure: 130/70 Patient status: Outpatient. Study date: Study date: 09/20/2017. Study time: 09:34 AM. Location: [facility]  3  -------------------------------------------------------------------  ------------------------------------------------------------------- Left ventricle: The cavity size was normal. Wall thickness was increased in a pattern of mild LVH. Systolic function was normal. The estimated ejection fraction was in the range of 55% to 60%.  ------------------------------------------------------------------- Aortic valve: Mildly thickened leaflets. Doppler: There was no regurgitation.  ------------------------------------------------------------------- Aorta: The aorta was normal, not dilated, and non-diseased.  ------------------------------------------------------------------- Mitral valve: There is bileaflet prolapse of the MV MR is directed anteirorly into the left atrium IT is at least moderately severe . Doppler: Peak gradient (D): 5 mm Hg.  ------------------------------------------------------------------- Left atrium: The atrium was mildly dilated.  ------------------------------------------------------------------- Right ventricle: The cavity size was normal. Wall thickness was normal. Systolic function was normal.  ------------------------------------------------------------------- Pulmonic valve: Structurally normal valve. Cusp separation was normal. Doppler: Transvalvular velocity was within the normal range. There was no regurgitation.  ------------------------------------------------------------------- Tricuspid valve: Structurally normal valve. Leaflet separation was normal. Doppler: Transvalvular velocity was within the normal range. There was no regurgitation.  ------------------------------------------------------------------- Right atrium: The atrium was normal in size.  ------------------------------------------------------------------- Pericardium: There was no pericardial  effusion.  ------------------------------------------------------------------- Systemic veins: Inferior vena cava: The vessel was normal in size. The respirophasic diameter changes were in the normal range (>= 50%), consistent with normal central venous pressure.  ------------------------------------------------------------------- Post procedure conclusions Ascending Aorta:  - The aorta was normal, not dilated, and non-diseased.  ------------------------------------------------------------------- Measurements  Left ventricle Value Reference LV ID, ED, PLAX chordal (L) 41.7 mm 43 - 52 LV ID, ES, PLAX chordal 23.8 mm 23 - 38 LV fx shortening, PLAX chordal 43 % >=29 LV PW thickness, ED 10.4 mm ---------- IVS/LV PW ratio, ED (H) 1.33 <=1.3 LV e&', lateral 7.51 cm/s ---------- LV E/e&', lateral 14.65 ---------- LV e&', medial 7.4 cm/s ---------- LV E/e&', medial 14.86 ---------- LV e&', average 7.46 cm/s ---------- LV E/e&', average 14.76 ----------  Ventricular septum Value Reference IVS thickness, ED 13.8 mm ----------  LVOT Value Reference LVOT ID, S 20 mm ---------- LVOT area 3.14 cm^2 ----------  Aortic valve Value Reference Aortic regurg peak velocity 298 cm/s ---------- Aortic regurg pressure half-time 750 ms ---------- Aortic regurg peak gradient 36 mm  Hg ----------  Aorta  Value Reference Aortic root ID, ED 30 mm ----------  Left atrium Value Reference LA ID, A-P, ES 36 mm ---------- LA ID/bsa, A-P 2.03 cm/m^2 <=2.2 LA volume, S 69.3 ml ---------- LA volume/bsa, S 39.2 ml/m^2 ---------- LA volume, ES, 1-p A4C 62 ml ---------- LA volume/bsa, ES, 1-p A4C 35 ml/m^2 ---------- LA volume, ES, 1-p A2C 69.3 ml ---------- LA volume/bsa, ES, 1-p A2C 39.2 ml/m^2 ----------  Mitral valve Value Reference Mitral E-wave peak velocity 110 cm/s ---------- Mitral A-wave peak velocity 79.6 cm/s ---------- Mitral deceleration time 187 ms 150 - 230 Mitral peak gradient, D 5 mm Hg ---------- Mitral E/A ratio, peak 1.4 ---------- Aliasing velocity, MR PISA 38.5 cm/s ---------- Mitral regurg PISA radius 9 mm ---------- Mitral regurg VTI, PISA 98.3 cm ---------- Mitral ERO, PISA 0.31 cm^2 ---------- Mitral regurg volume, PISA 30 ml ----------  Pulmonary arteries Value Reference PA pressure, S, DP (H) 35 mm Hg <=30  Tricuspid valve Value Reference Tricuspid regurg peak velocity 284 cm/s ---------- Tricuspid peak RV-RA gradient 32 mm Hg ----------  Right atrium Value Reference RA ID, S-I, ES, A4C (H) 50.2 mm 34 - 49 RA area, ES, A4C  10.6 cm^2 8.3 - 19.5 RA volume, ES, A/L 18.3 ml ---------- RA volume/bsa, ES, A/L 10.3 ml/m^2 ----------  Systemic veins Value Reference Estimated CVP 3 mm Hg ----------  Right ventricle Value Reference RV pressure, S, DP (H) 35 mm Hg <=30 RV s&', lateral, S 17.5 cm/s ----------  Pulmonic valve Value Reference Pulmonic regurg velocity, ED 100 cm/s ----------  Legend: (L) and (H) mark values outside specified reference range.  ------------------------------------------------------------------- Prepared and Electronically Authenticated by  Dorris Carnes, M.D. 2019-04-29T15:14:05    Transesophageal Echocardiography  Patient: Glorious, Flicker MR #: [id number] Study Date: 11/09/2017 Gender: F Age: 79 Height: 154.9 cm Weight: 72.5 kg BSA: 1.79 m^2 Pt. Status: Room:  ADMITTING Loralie Champagne, M.D. ATTENDING Loralie Champagne, M.D. PERFORMING Loralie Champagne, M.D. Joanna Hews [id number] OVZCHYIFO YDXA, JOI [id number] SONOGRAPHER Jannett Celestine, RDCS  cc:  ------------------------------------------------------------------- LV EF: 60%  ------------------------------------------------------------------- History: PMH: Mitral Valve Prolapse Evaluation. Murmur.  ------------------------------------------------------------------- Study Conclusions  - Left ventricle: The cavity size was normal. Wall thickness was normal. The estimated ejection fraction was 60%. Wall motion was normal; there were no regional wall motion abnormalities. - Aortic valve: There was no stenosis. There was trivial regurgitation. - Aorta: Normal  caliber aorta with minimal plaque. - Left atrium: The atrium was mildly dilated. No evidence of thrombus in the atrial cavity or appendage. - Right ventricle: The cavity size was normal. Systolic function was normal. - Right atrium: No evidence of thrombus in the atrial cavity or appendage. - Atrial septum: No ASD/PFO by color doppler. - Tricuspid valve: Peak RV-RA gradient (S): 18 mm Hg. - Impressions: Severe mitral regurgitation in setting of bileaflet MVP and partial flail P2 scallop.  Impressions:  - Severe mitral regurgitation in setting of bileaflet MVP and partial flail P2 scallop.  ------------------------------------------------------------------- Study data: Study status: Routine. Consent: The risks, benefits, and alternatives to the procedure were explained to the patient and informed consent was obtained. Procedure: The patient reported no pain pre or post test. Initial setup. The patient was brought to the laboratory. Surface ECG leads were monitored. Sedation. Conscious sedation was administered by cardiology staff. Transesophageal echocardiography. Topical anesthesia was obtained using viscous lidocaine. A transesophageal probe was inserted by the attending cardiologistwithout difficulty. Image quality was adequate. Study completion: The patient tolerated the procedure  well. There were no complications. Diagnostic transesophageal echocardiography. 2D and color Doppler. Birthdate: Patient birthdate: 01-24-1946. Age: Patient is 72 yr old. Sex: Gender: female. BMI: 30.2 kg/m^2. Blood pressure: 118/52 Study date: Study date: 11/09/2017. Study time: 10:40 AM. Location: Endoscopy.  -------------------------------------------------------------------  ------------------------------------------------------------------- Left ventricle: The cavity size was normal. Wall thickness was normal. The estimated ejection fraction  was 60%. Wall motion was normal; there were no regional wall motion abnormalities.  ------------------------------------------------------------------- Aortic valve: Trileaflet. Doppler: There was no stenosis. There was trivial regurgitation.  ------------------------------------------------------------------- Aorta: Normal caliber aorta with minimal plaque.  ------------------------------------------------------------------- Mitral valve: There was bileaflet prolapse of the mitral valve. There was partial flail of the P2 scallop of the posterior leaflet with highly eccentric, anteriorly-directed mitral regurgitation. The regurgitation appeared severe, unable to do PISA calculation because the jet was so eccentric. There was not systolic flow reversal in the pulmonary vein doppler pattern. Doppler: There was no evidence for stenosis.  ------------------------------------------------------------------- Left atrium: The atrium was mildly dilated. No evidence of thrombus in the atrial cavity or appendage.  ------------------------------------------------------------------- Atrial septum: No ASD/PFO by color doppler.  ------------------------------------------------------------------- Right ventricle: The cavity size was normal. Systolic function was normal.  ------------------------------------------------------------------- Pulmonic valve: Structurally normal valve. Cusp separation was normal.  ------------------------------------------------------------------- Tricuspid valve: Doppler: There was trivial regurgitation.  ------------------------------------------------------------------- Right atrium: The atrium was normal in size. No evidence of thrombus in the atrial cavity or appendage.  ------------------------------------------------------------------- Pericardium: There was no pericardial  effusion.  ------------------------------------------------------------------- Post procedure conclusions Ascending Aorta:  - Normal caliber aorta with minimal plaque.  ------------------------------------------------------------------- Measurements  Tricuspid valve Value Tricuspid regurg peak velocity 212 cm/s Tricuspid peak RV-RA gradient 18 mm Hg  Legend: (L) and (H) mark values outside specified reference range.  ------------------------------------------------------------------- Prepared and Electronically Authenticated by  Loralie Champagne, M.D. 2019-06-22T15:50:42   RIGHT/LEFT HEART CATH AND CORONARY ANGIOGRAPHY  Conclusion     The left ventricular systolic function is normal.  LV end diastolic pressure is normal.  The left ventricular ejection fraction is 55-65% by visual estimate.  There is severe (4+) mitral regurgitation and moderate mitral valve prolapse.  1. Normal coronary anatomy 2. Normal LV function 3. Mitral valve prolapse with severe MR 3-4+ 4. Normal right heart pressures 5. Normal LV filling pressures  No indication for antiplatelet therapy at this time.  Will refer to Dr. Roxy Manns for consideration of MV repair.    Indications   Non-rheumatic mitral regurgitation [I34.0 (ICD-10-CM)]  Procedural Details/Technique   Technical Details Indication: 72 yo WF with MV prolapse and severe MR  Procedural Details: The right wrist was prepped, draped, and anesthetized with 1% lidocaine. Using the modified Seldinger technique a 6 Fr slender sheath was placed in the right radial artery and a 5 French sheath was placed in the left brachial vein. A Swan-Ganz catheter was used for the right heart catheterization. Standard protocol was followed for recording of right heart pressures and sampling of oxygen saturations. Fick cardiac output was calculated. Standard Judkins catheters were used for selective  coronary angiography and left ventriculography. There were no immediate procedural complications. The patient was transferred to the post catheterization recovery area for further monitoring.  Contrast: 95 cc   Estimated blood loss <50 mL.  During this procedure the patient was administered the following to achieve and maintain moderate conscious sedation: Versed 1 mg, Fentanyl 25 mcg, while the patient's heart rate, blood pressure, and oxygen saturation were continuously monitored. The period of conscious sedation was 31 minutes, of which I was  present face-to-face 100% of this time.  Complications   Complications documented before study signed (01/18/2018 8:44 AM EDT)    No complications were associated with this study.  Documented by Martinique, Peter M, MD - 01/18/2018 8:22 AM EDT    Coronary Findings   Diagnostic  Dominance: Right  Left Main  Vessel was injected. Vessel is normal in caliber. Vessel is angiographically normal.  Left Anterior Descending  Vessel was injected. Vessel is normal in caliber. Vessel is angiographically normal.  Left Circumflex  Vessel was injected. Vessel is normal in caliber. Vessel is angiographically normal.  Right Coronary Artery  Vessel was injected. Vessel is normal in caliber. Vessel is angiographically normal.  Intervention   No interventions have been documented.  Wall Motion        Resting               Left Heart   Left Ventricle The left ventricular size is normal. The left ventricular systolic function is normal. LV end diastolic pressure is normal. The left ventricular ejection fraction is 55-65% by visual estimate. No regional wall motion abnormalities.  Mitral Valve There is severe (4+) mitral regurgitation and moderate mitral valve prolapse.  Coronary Diagrams   Diagnostic Diagram       Implants       No implant documentation for this case.  MERGE Images   Show images for CARDIAC CATHETERIZATION   Link  to Procedure Log   Procedure Log    Hemo Data    Most Recent Value  Fick Cardiac Output 4.95 L/min  Fick Cardiac Output Index 2.89 (L/min)/BSA  RA A Wave 7 mmHg  RA V Wave 8 mmHg  RA Mean 7 mmHg  RV Systolic Pressure 29 mmHg  RV Diastolic Pressure 3 mmHg  RV EDP 11 mmHg  PA Systolic Pressure 28 mmHg  PA Diastolic Pressure 9 mmHg  PA Mean 16 mmHg  PW A Wave 16 mmHg  PW V Wave 22 mmHg  PW Mean 18 mmHg  AO Systolic Pressure 366 mmHg  AO Diastolic Pressure 62 mmHg  AO Mean 82 mmHg  LV Systolic Pressure 440 mmHg  LV Diastolic Pressure 4 mmHg  LV EDP 14 mmHg  AOp Systolic Pressure 347 mmHg  AOp Diastolic Pressure 51 mmHg  AOp Mean Pressure 70 mmHg  LVp Systolic Pressure 425 mmHg  LVp Diastolic Pressure 5 mmHg  LVp EDP Pressure 19 mmHg  QP/QS 1  TPVR Index 5.54 HRUI  TSVR Index 28.36 HRUI  TPVR/TSVR Ratio 0.2   CT ANGIOGRAPHY CHEST, ABDOMEN AND PELVIS  TECHNIQUE: Multidetector CT imaging through the chest, abdomen and pelvis was performed using the standard protocol during bolus administration of intravenous contrast. Multiplanar reconstructed images and MIPs were obtained and reviewed to evaluate the vascular anatomy.  CONTRAST: 64mL ISOVUE-370 IOPAMIDOL (ISOVUE-370) INJECTION 76%  COMPARISON: None.  FINDINGS: CTA CHEST FINDINGS  Cardiovascular:  Heart:  No pericardial fluid/thickening. No significant calcifications of the aortic valve or mitral annulus. No significant coronary calcifications.  Aorta:  Unremarkable course, caliber, contour of the thoracic aorta. No aneurysm or dissection flap. No periaortic fluid.  Pulmonary arteries:  No central filling defects of the main pulmonary artery, lobar arteries, proximal segmental arteries. Main pulmonary artery diameter measures 2.4 cm.  Mediastinum/Nodes: Heterogeneous nodule of the left thyroid the extension towards the thoracic inlet. Diameter on the axial images measures 2.9 cm  and the diameter on the coronal images measures 3.0 cm. Hiatal hernia.  No mediastinal adenopathy with small  lymph nodes present.  Lungs/Pleura: Central airways are clear. No pleural effusion. No confluent airspace disease.  No pneumothorax. Mild pattern of geographic ground-glass opacity.  Musculoskeletal: No acute displaced fracture. Degenerative changes of the spine.  Review of the MIP images confirms the above findings.  CTA ABDOMEN AND PELVIS FINDINGS  VASCULAR  Aorta: Unremarkable course, caliber, contour of the abdominal aorta. No dissection, aneurysm, or periaortic fluid.  Celiac: Celiac artery patent with typical branch pattern. No significant atherosclerosis.  SMA: SMA patent without significant atherosclerosis.  Renals: Bilateral renal arteries are patent without significant atherosclerosis.  IMA: IMA patent.  Right lower extremity:  Unremarkable course, caliber, and contour of the right iliac system. No aneurysm, dissection, or occlusion. Hypogastric artery is patent. Anterior and posterior division patent. Common femoral artery patent. Proximal SFA and profunda femoris patent.  Left lower extremity:  Unremarkable course, caliber, and contour of the left iliac system. No aneurysm, dissection, or occlusion. Hypogastric artery is patent. Anterior and posterior division patent. Common femoral artery patent. Proximal SFA and profunda femoris patent.  Veins: Unremarkable appearance of the venous system.  Review of the MIP images confirms the above findings.  NON-VASCULAR  Hepatobiliary: Unremarkable appearance of the liver. Unremarkable gall bladder.  Pancreas: Unremarkable pancreas  Spleen: Unremarkable  Adrenals/Urinary Tract: Unremarkable appearance of adrenal glands.  Right:  No hydronephrosis. Symmetric perfusion to the left. No nephrolithiasis. Unremarkable course of the right ureter.  Left:  No  hydronephrosis. Symmetric perfusion to the right. No nephrolithiasis. Unremarkable course of the left ureter.  Unremarkable appearance of the urinary bladder .  Stomach/Bowel: Hiatal hernia. Otherwise unremarkable stomach. Unremarkable appearance of small bowel. No evidence of obstruction. Normal appendix colonic diverticular disease without evidence of acute diverticulitis.  Lymphatic: No lymphadenopathy  Mesenteric: No free fluid or air. No adenopathy.  Reproductive: Unremarkable uterus and adnexa  Other: Small fat containing umbilical hernia  Musculoskeletal: No acute displaced fracture. Degenerative changes of the lumbar spine most pronounced at L5-S1.  IMPRESSION: No acute CT finding.  No significant atherosclerotic vascular disease.  Incidental left thyroid nodule measuring 2.9 cm, poorly characterized by CT. Dedicated thyroid ultrasound may be considered for more specific evaluation.  Mild pattern of geographic ground-glass opacity throughout the lungs, most commonly seen with small airway disease/air trapping.  Hiatal hernia.  Diverticular disease without evidence of acute diverticulitis.  Signed,  Dulcy Fanny. Dellia Nims, RPVI  [facility]  [facility]   Electronically Signed By: Corrie Mckusick D.O. On: 03/07/2018 16:01    Impression:  Patient has mitral valve prolapse with stage D severe symptomatic primary mitral regurgitation. I have personally reviewed the patient's most recent transthoracic echocardiogram, transesophageal echocardiogram, and diagnostic cardiac catheterization. 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 There are clearly ruptured chordae tendinae.There is severe mitral regurgitation. Left ventricular  size and systolic function remain normal. Diagnostic cardiac catheterization confirmed the presence of severe mitral regurgitation and revealed normal coronary arteries with no significant coronary artery disease. Right heart pressures were normal. Based upon review of the patient's TEE I feel that risks associated with surgery should be low and the likelihood of successful and durable repair greater than 95%. The patient appears to be relatively good candidate for minimally invasive approach for surgery.  CT angiography reveals no contraindication to peripheral cannulation for surgery.  The patient was noted to have a small thyroid nodule  on CT scan.    Plan:  The patient was again counseled at length regarding the indications, risks and potential benefits of mitral valve repair. The rationale for elective surgery has been explained, including a comparison between surgery and continued medical therapy with close follow-up. The likelihood of successful and durable valve repair has been discussed with particular reference to the findings of their recent echocardiogram. Based upon these findings and previous experience, I have quoted hera greater than 95percent likelihood of successful valve repair. The patient understands and accepts all potential risks of surgery including but not limited to risk of death, stroke or other neurologic complication, myocardial infarction, congestive heart failure, respiratory failure, renal failure, bleeding requiring transfusion and/or reexploration, arrhythmia, infection or other wound complications, pneumonia, pleural and/or pericardial effusion, pulmonary embolus, aortic dissection or other major vascular complication, or delayed complications related to valve repair or replacement including but not limited to structural valve deterioration and failure, thrombosis, embolization, endocarditis, or paravalvular leak. Alternative surgical approaches have been  discussed including a comparison between conventional sternotomy and minimally-invasive techniques. The relative risks and benefits of each have been reviewed as they pertain to the patient's specific circumstances, and all of their questions have been addressed. Specific risks potentially related to the minimally-invasive approach were discussed at length, including but not limited to risk of conversion to full or partial sternotomy, aortic dissection or other major vascular complication, unilateral acute lung injury or pulmonary edema, phrenic nerve dysfunction or paralysis, rib fracture, chronic pain, lung hernia, or lymphocele. Expectations for the patient's postoperative convalescence have been discussed in detail.  The small nodule noted on CT scan was discussed and the possibility of elective thyroid ultrasound in the future was discussed.  All ofher questions have been answered.    Valentina Gu. Roxy Manns, MD 03/07/2018 4:11 PM

## 2018-03-07 NOTE — Patient Instructions (Signed)
   Continue taking all current medications without change through the day before surgery.  Have nothing to eat or drink after midnight the night before surgery.  On the morning of surgery take only Synthroid and Prilosec with a sip of water.

## 2018-03-09 MED ORDER — KENNESTONE BLOOD CARDIOPLEGIA (KBC) MANNITOL SYRINGE (20%, 32ML)
32.0000 mL | Freq: Once | INTRAVENOUS | Status: DC
  Filled 2018-03-09: qty 32

## 2018-03-09 MED ORDER — SODIUM CHLORIDE 0.9 % IV SOLN
INTRAVENOUS | Status: DC
  Filled 2018-03-09: qty 30

## 2018-03-09 MED ORDER — MILRINONE LACTATE IN DEXTROSE 20-5 MG/100ML-% IV SOLN
0.3000 ug/kg/min | INTRAVENOUS | Status: DC
  Filled 2018-03-09: qty 100

## 2018-03-09 MED ORDER — PLASMA-LYTE 148 IV SOLN
INTRAVENOUS | Status: DC
  Filled 2018-03-09: qty 2.5

## 2018-03-09 MED ORDER — EPINEPHRINE PF 1 MG/ML IJ SOLN
0.0000 ug/min | INTRAVENOUS | Status: DC
  Filled 2018-03-09: qty 4

## 2018-03-09 MED ORDER — TRANEXAMIC ACID 1000 MG/10ML IV SOLN
1.5000 mg/kg/h | INTRAVENOUS | Status: AC
  Administered 2018-03-10: 1.5 mg/kg/h via INTRAVENOUS
  Filled 2018-03-09: qty 25

## 2018-03-09 MED ORDER — TRANEXAMIC ACID (OHS) BOLUS VIA INFUSION
15.0000 mg/kg | INTRAVENOUS | Status: AC
  Administered 2018-03-10: 1108.5 mg via INTRAVENOUS
  Filled 2018-03-09: qty 1109

## 2018-03-09 MED ORDER — GLUTARALDEHYDE 0.625% SOAKING SOLUTION
TOPICAL | Status: DC
  Filled 2018-03-09 (×2): qty 50

## 2018-03-09 MED ORDER — VANCOMYCIN HCL 10 G IV SOLR
1250.0000 mg | INTRAVENOUS | Status: AC
  Administered 2018-03-10: 1250 mg via INTRAVENOUS
  Filled 2018-03-09: qty 1250

## 2018-03-09 MED ORDER — SODIUM CHLORIDE 0.9 % IV SOLN
750.0000 mg | INTRAVENOUS | Status: DC
  Filled 2018-03-09: qty 750

## 2018-03-09 MED ORDER — DOPAMINE-DEXTROSE 3.2-5 MG/ML-% IV SOLN
0.0000 ug/kg/min | INTRAVENOUS | Status: DC
  Filled 2018-03-09: qty 250

## 2018-03-09 MED ORDER — VANCOMYCIN HCL 1000 MG IV SOLR
INTRAVENOUS | Status: AC
  Administered 2018-03-10: 1000 mL
  Filled 2018-03-09: qty 1000

## 2018-03-09 MED ORDER — TRANEXAMIC ACID (OHS) PUMP PRIME SOLUTION
2.0000 mg/kg | INTRAVENOUS | Status: DC
  Filled 2018-03-09: qty 1.48

## 2018-03-09 MED ORDER — NOREPINEPHRINE 4 MG/250ML-% IV SOLN
0.0000 ug/min | INTRAVENOUS | Status: DC
  Filled 2018-03-09: qty 250

## 2018-03-09 MED ORDER — KENNESTONE BLOOD CARDIOPLEGIA VIAL
13.0000 mL | Freq: Once | Status: DC
  Filled 2018-03-09: qty 13

## 2018-03-09 MED ORDER — PHENYLEPHRINE HCL-NACL 20-0.9 MG/250ML-% IV SOLN
30.0000 ug/min | INTRAVENOUS | Status: DC
  Filled 2018-03-09: qty 250

## 2018-03-09 MED ORDER — SODIUM CHLORIDE 0.9 % IV SOLN
1.5000 g | INTRAVENOUS | Status: AC
  Administered 2018-03-10: 1.5 g via INTRAVENOUS
  Filled 2018-03-09: qty 1.5

## 2018-03-09 MED ORDER — NITROGLYCERIN IN D5W 200-5 MCG/ML-% IV SOLN
2.0000 ug/min | INTRAVENOUS | Status: DC
  Filled 2018-03-09: qty 250

## 2018-03-09 MED ORDER — DEXMEDETOMIDINE HCL IN NACL 400 MCG/100ML IV SOLN
0.1000 ug/kg/h | INTRAVENOUS | Status: AC
  Administered 2018-03-10: .4 ug/kg/h via INTRAVENOUS
  Filled 2018-03-09: qty 100

## 2018-03-09 MED ORDER — MAGNESIUM SULFATE 50 % IJ SOLN
40.0000 meq | INTRAMUSCULAR | Status: DC
  Filled 2018-03-09: qty 9.85

## 2018-03-09 MED ORDER — INSULIN REGULAR(HUMAN) IN NACL 100-0.9 UT/100ML-% IV SOLN
INTRAVENOUS | Status: AC
  Administered 2018-03-10: 1 [IU]/h via INTRAVENOUS
  Filled 2018-03-09: qty 100

## 2018-03-09 MED ORDER — POTASSIUM CHLORIDE 2 MEQ/ML IV SOLN
80.0000 meq | INTRAVENOUS | Status: DC
  Filled 2018-03-09: qty 40

## 2018-03-10 ENCOUNTER — Inpatient Hospital Stay (HOSPITAL_COMMUNITY): Payer: Medicare HMO | Admitting: Physician Assistant

## 2018-03-10 ENCOUNTER — Inpatient Hospital Stay (HOSPITAL_COMMUNITY): Payer: Medicare HMO

## 2018-03-10 ENCOUNTER — Encounter (HOSPITAL_COMMUNITY): Payer: Self-pay | Admitting: *Deleted

## 2018-03-10 ENCOUNTER — Inpatient Hospital Stay (HOSPITAL_COMMUNITY): Payer: Medicare HMO | Admitting: Certified Registered Nurse Anesthetist

## 2018-03-10 ENCOUNTER — Encounter (HOSPITAL_COMMUNITY)
Admission: RE | Disposition: A | Discharge status: Home / Self Care | Payer: Self-pay | Source: Ambulatory Visit | Attending: Thoracic Surgery (Cardiothoracic Vascular Surgery)

## 2018-03-10 ENCOUNTER — Ambulatory Visit (HOSPITAL_COMMUNITY): Payer: Medicare HMO | Attending: Thoracic Surgery (Cardiothoracic Vascular Surgery)

## 2018-03-10 ENCOUNTER — Inpatient Hospital Stay (HOSPITAL_COMMUNITY)
Admission: RE | Admit: 2018-03-10 | Discharge: 2018-03-16 | DRG: 219 | Disposition: A | Discharge status: Home / Self Care | Payer: Medicare HMO | Source: Ambulatory Visit | Attending: Thoracic Surgery (Cardiothoracic Vascular Surgery) | Admitting: Thoracic Surgery (Cardiothoracic Vascular Surgery)

## 2018-03-10 DIAGNOSIS — J9811 Atelectasis: Secondary | ICD-10-CM | POA: Diagnosis not present

## 2018-03-10 DIAGNOSIS — R001 Bradycardia, unspecified: Secondary | ICD-10-CM | POA: Diagnosis not present

## 2018-03-10 DIAGNOSIS — M810 Age-related osteoporosis without current pathological fracture: Secondary | ICD-10-CM | POA: Diagnosis not present

## 2018-03-10 DIAGNOSIS — J984 Other disorders of lung: Secondary | ICD-10-CM | POA: Diagnosis not present

## 2018-03-10 DIAGNOSIS — D62 Acute posthemorrhagic anemia: Secondary | ICD-10-CM | POA: Diagnosis not present

## 2018-03-10 DIAGNOSIS — I340 Nonrheumatic mitral (valve) insufficiency: Secondary | ICD-10-CM | POA: Diagnosis not present

## 2018-03-10 DIAGNOSIS — D6959 Other secondary thrombocytopenia: Secondary | ICD-10-CM | POA: Diagnosis not present

## 2018-03-10 DIAGNOSIS — I511 Rupture of chordae tendineae, not elsewhere classified: Secondary | ICD-10-CM | POA: Diagnosis present

## 2018-03-10 DIAGNOSIS — E876 Hypokalemia: Secondary | ICD-10-CM | POA: Diagnosis not present

## 2018-03-10 DIAGNOSIS — R0602 Shortness of breath: Secondary | ICD-10-CM | POA: Diagnosis not present

## 2018-03-10 DIAGNOSIS — Z4682 Encounter for fitting and adjustment of non-vascular catheter: Secondary | ICD-10-CM | POA: Diagnosis not present

## 2018-03-10 DIAGNOSIS — E039 Hypothyroidism, unspecified: Secondary | ICD-10-CM | POA: Diagnosis present

## 2018-03-10 DIAGNOSIS — R112 Nausea with vomiting, unspecified: Secondary | ICD-10-CM | POA: Diagnosis not present

## 2018-03-10 DIAGNOSIS — I371 Nonrheumatic pulmonary valve insufficiency: Secondary | ICD-10-CM | POA: Diagnosis not present

## 2018-03-10 DIAGNOSIS — R918 Other nonspecific abnormal finding of lung field: Secondary | ICD-10-CM | POA: Diagnosis not present

## 2018-03-10 DIAGNOSIS — Z9889 Other specified postprocedural states: Secondary | ICD-10-CM

## 2018-03-10 DIAGNOSIS — T502X5A Adverse effect of carbonic-anhydrase inhibitors, benzothiadiazides and other diuretics, initial encounter: Secondary | ICD-10-CM | POA: Diagnosis not present

## 2018-03-10 DIAGNOSIS — Z79899 Other long term (current) drug therapy: Secondary | ICD-10-CM | POA: Diagnosis not present

## 2018-03-10 DIAGNOSIS — F419 Anxiety disorder, unspecified: Secondary | ICD-10-CM | POA: Diagnosis not present

## 2018-03-10 DIAGNOSIS — Z888 Allergy status to other drugs, medicaments and biological substances status: Secondary | ICD-10-CM | POA: Diagnosis not present

## 2018-03-10 DIAGNOSIS — J90 Pleural effusion, not elsewhere classified: Secondary | ICD-10-CM | POA: Diagnosis not present

## 2018-03-10 DIAGNOSIS — I083 Combined rheumatic disorders of mitral, aortic and tricuspid valves: Secondary | ICD-10-CM | POA: Diagnosis not present

## 2018-03-10 HISTORY — DX: Other specified postprocedural states: Z98.890

## 2018-03-10 HISTORY — PX: MITRAL VALVE REPAIR: SHX2039

## 2018-03-10 HISTORY — PX: TEE WITHOUT CARDIOVERSION: SHX5443

## 2018-03-10 LAB — POCT I-STAT 3, ART BLOOD GAS (G3+)
ACID-BASE DEFICIT: 1 mmol/L (ref 0.0–2.0)
ACID-BASE DEFICIT: 5 mmol/L — AB (ref 0.0–2.0)
ACID-BASE DEFICIT: 6 mmol/L — AB (ref 0.0–2.0)
Acid-base deficit: 5 mmol/L — ABNORMAL HIGH (ref 0.0–2.0)
Acid-base deficit: 4 mmol/L — ABNORMAL HIGH (ref 0.0–2.0)
BICARBONATE: 19.5 mmol/L — AB (ref 20.0–28.0)
BICARBONATE: 18.9 mmol/L — AB (ref 20.0–28.0)
Bicarbonate: 23.1 mmol/L (ref 20.0–28.0)
Bicarbonate: 22.3 mmol/L (ref 20.0–28.0)
Bicarbonate: 19.4 mmol/L — ABNORMAL LOW (ref 20.0–28.0)
O2 SAT: 98 %
O2 Saturation: 100 %
O2 Saturation: 99 %
O2 Saturation: 98 %
O2 Saturation: 95 %
PCO2 ART: 43.3 mmHg (ref 32.0–48.0)
PH ART: 7.36 (ref 7.350–7.450)
PH ART: 7.423 (ref 7.350–7.450)
PH ART: 7.319 — AB (ref 7.350–7.450)
PO2 ART: 387 mmHg — AB (ref 83.0–108.0)
PO2 ART: 160 mmHg — AB (ref 83.0–108.0)
PO2 ART: 91 mmHg (ref 83.0–108.0)
PO2 ART: 82 mmHg — AB (ref 83.0–108.0)
Patient temperature: 35.2
Patient temperature: 36.8
TCO2: 24 mmol/L (ref 22–32)
TCO2: 21 mmol/L — AB (ref 22–32)
TCO2: 20 mmol/L — AB (ref 22–32)
TCO2: 24 mmol/L (ref 22–32)
TCO2: 20 mmol/L — ABNORMAL LOW (ref 22–32)
pCO2 arterial: 36.2 mmHg (ref 32.0–48.0)
pCO2 arterial: 34.5 mmHg (ref 32.0–48.0)
pCO2 arterial: 28.5 mmHg — ABNORMAL LOW (ref 32.0–48.0)
pCO2 arterial: 37.6 mmHg (ref 32.0–48.0)
pH, Arterial: 7.412 (ref 7.350–7.450)
pH, Arterial: 7.319 — ABNORMAL LOW (ref 7.350–7.450)
pO2, Arterial: 105 mmHg (ref 83.0–108.0)

## 2018-03-10 LAB — POCT I-STAT, CHEM 8
BUN: 17 mg/dL (ref 8–23)
BUN: 13 mg/dL (ref 8–23)
BUN: 16 mg/dL (ref 8–23)
BUN: 13 mg/dL (ref 8–23)
BUN: 13 mg/dL (ref 8–23)
BUN: 12 mg/dL (ref 8–23)
BUN: 12 mg/dL (ref 8–23)
CALCIUM ION: 1.2 mmol/L (ref 1.15–1.40)
CALCIUM ION: 1.06 mmol/L — AB (ref 1.15–1.40)
CHLORIDE: 108 mmol/L (ref 98–111)
CHLORIDE: 108 mmol/L (ref 98–111)
CHLORIDE: 110 mmol/L (ref 98–111)
CREATININE: 0.6 mg/dL (ref 0.44–1.00)
CREATININE: 0.7 mg/dL (ref 0.44–1.00)
Calcium, Ion: 0.9 mmol/L — ABNORMAL LOW (ref 1.15–1.40)
Calcium, Ion: 1.13 mmol/L — ABNORMAL LOW (ref 1.15–1.40)
Calcium, Ion: 0.99 mmol/L — ABNORMAL LOW (ref 1.15–1.40)
Calcium, Ion: 0.99 mmol/L — ABNORMAL LOW (ref 1.15–1.40)
Calcium, Ion: 1.21 mmol/L (ref 1.15–1.40)
Chloride: 105 mmol/L (ref 98–111)
Chloride: 109 mmol/L (ref 98–111)
Chloride: 105 mmol/L (ref 98–111)
Chloride: 110 mmol/L (ref 98–111)
Creatinine, Ser: 0.8 mg/dL (ref 0.44–1.00)
Creatinine, Ser: 0.6 mg/dL (ref 0.44–1.00)
Creatinine, Ser: 0.8 mg/dL (ref 0.44–1.00)
Creatinine, Ser: 0.6 mg/dL (ref 0.44–1.00)
Creatinine, Ser: 0.8 mg/dL (ref 0.44–1.00)
GLUCOSE: 103 mg/dL — AB (ref 70–99)
Glucose, Bld: 111 mg/dL — ABNORMAL HIGH (ref 70–99)
Glucose, Bld: 104 mg/dL — ABNORMAL HIGH (ref 70–99)
Glucose, Bld: 124 mg/dL — ABNORMAL HIGH (ref 70–99)
Glucose, Bld: 117 mg/dL — ABNORMAL HIGH (ref 70–99)
Glucose, Bld: 120 mg/dL — ABNORMAL HIGH (ref 70–99)
Glucose, Bld: 147 mg/dL — ABNORMAL HIGH (ref 70–99)
HCT: 37 % (ref 36.0–46.0)
HCT: 27 % — ABNORMAL LOW (ref 36.0–46.0)
HCT: 23 % — ABNORMAL LOW (ref 36.0–46.0)
HCT: 24 % — ABNORMAL LOW (ref 36.0–46.0)
HEMATOCRIT: 33 % — AB (ref 36.0–46.0)
HEMATOCRIT: 21 % — AB (ref 36.0–46.0)
HEMATOCRIT: 27 % — AB (ref 36.0–46.0)
HEMOGLOBIN: 11.2 g/dL — AB (ref 12.0–15.0)
HEMOGLOBIN: 7.8 g/dL — AB (ref 12.0–15.0)
Hemoglobin: 12.6 g/dL (ref 12.0–15.0)
Hemoglobin: 9.2 g/dL — ABNORMAL LOW (ref 12.0–15.0)
Hemoglobin: 7.1 g/dL — ABNORMAL LOW (ref 12.0–15.0)
Hemoglobin: 8.2 g/dL — ABNORMAL LOW (ref 12.0–15.0)
Hemoglobin: 9.2 g/dL — ABNORMAL LOW (ref 12.0–15.0)
POTASSIUM: 3.9 mmol/L (ref 3.5–5.1)
POTASSIUM: 3.9 mmol/L (ref 3.5–5.1)
Potassium: 4 mmol/L (ref 3.5–5.1)
Potassium: 3.6 mmol/L (ref 3.5–5.1)
Potassium: 3.7 mmol/L (ref 3.5–5.1)
Potassium: 4.3 mmol/L (ref 3.5–5.1)
Potassium: 3.8 mmol/L (ref 3.5–5.1)
SODIUM: 140 mmol/L (ref 135–145)
SODIUM: 144 mmol/L (ref 135–145)
Sodium: 142 mmol/L (ref 135–145)
Sodium: 141 mmol/L (ref 135–145)
Sodium: 142 mmol/L (ref 135–145)
Sodium: 138 mmol/L (ref 135–145)
Sodium: 142 mmol/L (ref 135–145)
TCO2: 25 mmol/L (ref 22–32)
TCO2: 24 mmol/L (ref 22–32)
TCO2: 25 mmol/L (ref 22–32)
TCO2: 24 mmol/L (ref 22–32)
TCO2: 24 mmol/L (ref 22–32)
TCO2: 21 mmol/L — ABNORMAL LOW (ref 22–32)
TCO2: 21 mmol/L — AB (ref 22–32)

## 2018-03-10 LAB — CBC
HCT: 32.3 % — ABNORMAL LOW (ref 36.0–46.0)
HEMATOCRIT: 36 % (ref 36.0–46.0)
HEMOGLOBIN: 11.8 g/dL — AB (ref 12.0–15.0)
HEMOGLOBIN: 10.3 g/dL — AB (ref 12.0–15.0)
MCH: 30.9 pg (ref 26.0–34.0)
MCH: 30.5 pg (ref 26.0–34.0)
MCHC: 32.8 g/dL (ref 30.0–36.0)
MCHC: 31.9 g/dL (ref 30.0–36.0)
MCV: 94.2 fL (ref 80.0–100.0)
MCV: 95.6 fL (ref 80.0–100.0)
NRBC: 0 % (ref 0.0–0.2)
NRBC: 0 % (ref 0.0–0.2)
PLATELETS: 139 10*3/uL — AB (ref 150–400)
Platelets: 102 10*3/uL — ABNORMAL LOW (ref 150–400)
RBC: 3.82 MIL/uL — AB (ref 3.87–5.11)
RBC: 3.38 MIL/uL — ABNORMAL LOW (ref 3.87–5.11)
RDW: 12.7 % (ref 11.5–15.5)
RDW: 13 % (ref 11.5–15.5)
WBC: 18.1 10*3/uL — ABNORMAL HIGH (ref 4.0–10.5)
WBC: 15.5 10*3/uL — AB (ref 4.0–10.5)

## 2018-03-10 LAB — POCT I-STAT 4, (NA,K, GLUC, HGB,HCT)
Glucose, Bld: 108 mg/dL — ABNORMAL HIGH (ref 70–99)
HCT: 33 % — ABNORMAL LOW (ref 36.0–46.0)
HEMOGLOBIN: 11.2 g/dL — AB (ref 12.0–15.0)
Potassium: 4.4 mmol/L (ref 3.5–5.1)
SODIUM: 144 mmol/L (ref 135–145)

## 2018-03-10 LAB — PROTIME-INR
INR: 1.37
Prothrombin Time: 16.8 seconds — ABNORMAL HIGH (ref 11.4–15.2)

## 2018-03-10 LAB — GLUCOSE, CAPILLARY
GLUCOSE-CAPILLARY: 110 mg/dL — AB (ref 70–99)
GLUCOSE-CAPILLARY: 95 mg/dL (ref 70–99)
Glucose-Capillary: 112 mg/dL — ABNORMAL HIGH (ref 70–99)
Glucose-Capillary: 144 mg/dL — ABNORMAL HIGH (ref 70–99)

## 2018-03-10 LAB — CREATININE, SERUM
CREATININE: 0.91 mg/dL (ref 0.44–1.00)
GFR calc Af Amer: >60 mL/min (ref >60)

## 2018-03-10 LAB — MAGNESIUM: MAGNESIUM: 3.6 mg/dL — AB (ref 1.7–2.4)

## 2018-03-10 LAB — HEMOGLOBIN AND HEMATOCRIT, BLOOD
HEMATOCRIT: 25.3 % — AB (ref 36.0–46.0)
Hemoglobin: 8.1 g/dL — ABNORMAL LOW (ref 12.0–15.0)

## 2018-03-10 LAB — PLATELET COUNT: Platelets: 155 10*3/uL (ref 150–400)

## 2018-03-10 LAB — APTT: APTT: 30 s (ref 24–36)

## 2018-03-10 LAB — PREPARE RBC (CROSSMATCH)

## 2018-03-10 SURGERY — REPAIR, MITRAL VALVE, MINIMALLY INVASIVE
Anesthesia: General | Site: Chest | Laterality: Right

## 2018-03-10 MED ORDER — TRAMADOL HCL 50 MG PO TABS
50.0000 mg | ORAL_TABLET | ORAL | Status: DC | PRN
  Administered 2018-03-11 – 2018-03-16 (×13): 100 mg via ORAL
  Filled 2018-03-10 (×10): qty 2
  Filled 2018-03-10: qty 1
  Filled 2018-03-10 (×4): qty 2

## 2018-03-10 MED ORDER — BUPIVACAINE HCL (PF) 0.5 % IJ SOLN
INTRAMUSCULAR | Status: DC | PRN
  Administered 2018-03-10: 10 mL

## 2018-03-10 MED ORDER — MORPHINE SULFATE (PF) 2 MG/ML IV SOLN: 1.0000 mg | INTRAVENOUS | Status: DC | PRN

## 2018-03-10 MED ORDER — LACTATED RINGERS IV SOLN
INTRAVENOUS | Status: DC
  Administered 2018-03-11: 22:00:00 via INTRAVENOUS

## 2018-03-10 MED ORDER — ROCURONIUM BROMIDE 10 MG/ML (PF) SYRINGE
PREFILLED_SYRINGE | INTRAVENOUS | Status: DC | PRN
  Administered 2018-03-10: 20 mg via INTRAVENOUS
  Administered 2018-03-10: 50 mg via INTRAVENOUS
  Administered 2018-03-10: 10 mg via INTRAVENOUS
  Administered 2018-03-10: 50 mg via INTRAVENOUS

## 2018-03-10 MED ORDER — SODIUM CHLORIDE 0.9 % IV SOLN
250.0000 mL | INTRAVENOUS | Status: DC
  Administered 2018-03-11: 20 mL via INTRAVENOUS

## 2018-03-10 MED ORDER — LACTATED RINGERS IV SOLN
INTRAVENOUS | Status: DC | PRN
  Administered 2018-03-10: 07:00:00 via INTRAVENOUS

## 2018-03-10 MED ORDER — ONDANSETRON HCL 4 MG/2ML IJ SOLN
4.0000 mg | Freq: Four times a day (QID) | INTRAMUSCULAR | Status: DC | PRN
  Administered 2018-03-10 – 2018-03-13 (×6): 4 mg via INTRAVENOUS
  Filled 2018-03-10 (×7): qty 2

## 2018-03-10 MED ORDER — SODIUM CHLORIDE 0.9% FLUSH: 3.0000 mL | INTRAVENOUS | Status: DC | PRN

## 2018-03-10 MED ORDER — BUPIVACAINE 0.5 % ON-Q PUMP SINGLE CATH 400 ML
400.0000 mL | INJECTION | Status: DC
  Filled 2018-03-10: qty 400

## 2018-03-10 MED ORDER — PROTAMINE SULFATE 10 MG/ML IV SOLN
INTRAVENOUS | Status: DC | PRN
  Administered 2018-03-10: 240 mg via INTRAVENOUS

## 2018-03-10 MED ORDER — ACETAMINOPHEN 160 MG/5ML PO SOLN: 650.0000 mg | Freq: Once | ORAL | Status: AC

## 2018-03-10 MED ORDER — BUPIVACAINE HCL (PF) 0.5 % IJ SOLN
INTRAMUSCULAR | Status: AC
  Filled 2018-03-10: qty 10

## 2018-03-10 MED ORDER — METOPROLOL TARTRATE 12.5 MG HALF TABLET: 12.5000 mg | ORAL_TABLET | Freq: Two times a day (BID) | ORAL | Status: DC

## 2018-03-10 MED ORDER — NITROGLYCERIN IN D5W 200-5 MCG/ML-% IV SOLN: 0.0000 ug/min | INTRAVENOUS | Status: DC

## 2018-03-10 MED ORDER — SODIUM CHLORIDE 0.9 % IV SOLN
1.5000 g | Freq: Two times a day (BID) | INTRAVENOUS | Status: AC
  Administered 2018-03-10 – 2018-03-12 (×4): 1.5 g via INTRAVENOUS
  Filled 2018-03-10 (×4): qty 1.5

## 2018-03-10 MED ORDER — MIDAZOLAM HCL 5 MG/5ML IJ SOLN
INTRAMUSCULAR | Status: DC | PRN
  Administered 2018-03-10 (×3): 1 mg via INTRAVENOUS
  Administered 2018-03-10: 3 mg via INTRAVENOUS

## 2018-03-10 MED ORDER — SODIUM CHLORIDE 0.9 % IV SOLN
INTRAVENOUS | Status: DC
  Administered 2018-03-10: 15:00:00 via INTRAVENOUS

## 2018-03-10 MED ORDER — BISACODYL 5 MG PO TBEC
10.0000 mg | DELAYED_RELEASE_TABLET | Freq: Every day | ORAL | Status: DC
  Administered 2018-03-11 – 2018-03-14 (×4): 10 mg via ORAL
  Filled 2018-03-10 (×5): qty 2

## 2018-03-10 MED ORDER — SODIUM CHLORIDE 0.45 % IV SOLN
INTRAVENOUS | Status: DC | PRN
  Administered 2018-03-10 – 2018-03-11 (×2): via INTRAVENOUS

## 2018-03-10 MED ORDER — CHLORHEXIDINE GLUCONATE 0.12 % MT SOLN
15.0000 mL | OROMUCOSAL | Status: AC
  Administered 2018-03-10: 15 mL via OROMUCOSAL

## 2018-03-10 MED ORDER — MIDAZOLAM HCL 2 MG/2ML IJ SOLN
2.0000 mg | INTRAMUSCULAR | Status: DC | PRN
  Filled 2018-03-10: qty 2

## 2018-03-10 MED ORDER — INSULIN ASPART 100 UNIT/ML ~~LOC~~ SOLN
0.0000 [IU] | SUBCUTANEOUS | Status: DC
  Administered 2018-03-10 – 2018-03-11 (×2): 2 [IU] via SUBCUTANEOUS

## 2018-03-10 MED ORDER — PROPOFOL 10 MG/ML IV BOLUS
INTRAVENOUS | Status: AC
  Filled 2018-03-10: qty 20

## 2018-03-10 MED ORDER — PANTOPRAZOLE SODIUM 40 MG PO TBEC
40.0000 mg | DELAYED_RELEASE_TABLET | Freq: Every day | ORAL | Status: DC
  Administered 2018-03-12 – 2018-03-16 (×5): 40 mg via ORAL
  Filled 2018-03-10 (×5): qty 1

## 2018-03-10 MED ORDER — PHENYLEPHRINE 40 MCG/ML (10ML) SYRINGE FOR IV PUSH (FOR BLOOD PRESSURE SUPPORT)
PREFILLED_SYRINGE | INTRAVENOUS | Status: AC
  Filled 2018-03-10: qty 10

## 2018-03-10 MED ORDER — CHLORHEXIDINE GLUCONATE 4 % EX LIQD: 30.0000 mL | CUTANEOUS | Status: DC

## 2018-03-10 MED ORDER — INSULIN REGULAR(HUMAN) IN NACL 100-0.9 UT/100ML-% IV SOLN
INTRAVENOUS | Status: DC
  Administered 2018-03-10: 0.9 [IU] via INTRAVENOUS

## 2018-03-10 MED ORDER — POTASSIUM CHLORIDE 10 MEQ/50ML IV SOLN: 10.0000 meq | INTRAVENOUS | Status: AC

## 2018-03-10 MED ORDER — ACETAMINOPHEN 160 MG/5ML PO SOLN: 1000.0000 mg | Freq: Four times a day (QID) | ORAL | Status: DC

## 2018-03-10 MED ORDER — BISACODYL 10 MG RE SUPP: 10.0000 mg | Freq: Every day | RECTAL | Status: DC

## 2018-03-10 MED ORDER — METOPROLOL TARTRATE 5 MG/5ML IV SOLN: 2.5000 mg | INTRAVENOUS | Status: DC | PRN

## 2018-03-10 MED ORDER — SODIUM CHLORIDE 0.9 % IV SOLN
INTRAVENOUS | Status: DC | PRN
  Administered 2018-03-10: 15 ug/min via INTRAVENOUS

## 2018-03-10 MED ORDER — METOPROLOL TARTRATE 25 MG/10 ML ORAL SUSPENSION: 12.5000 mg | Freq: Two times a day (BID) | ORAL | Status: DC

## 2018-03-10 MED ORDER — OXYCODONE HCL 5 MG PO TABS
5.0000 mg | ORAL_TABLET | ORAL | Status: DC | PRN
  Administered 2018-03-11 – 2018-03-12 (×4): 10 mg via ORAL
  Filled 2018-03-10 (×5): qty 2

## 2018-03-10 MED ORDER — SUCCINYLCHOLINE CHLORIDE 200 MG/10ML IV SOSY
PREFILLED_SYRINGE | INTRAVENOUS | Status: DC | PRN
  Administered 2018-03-10: 120 mg via INTRAVENOUS

## 2018-03-10 MED ORDER — DOCUSATE SODIUM 100 MG PO CAPS
200.0000 mg | ORAL_CAPSULE | Freq: Every day | ORAL | Status: DC
  Administered 2018-03-11 – 2018-03-14 (×4): 200 mg via ORAL
  Filled 2018-03-10 (×5): qty 2

## 2018-03-10 MED ORDER — FENTANYL CITRATE (PF) 250 MCG/5ML IJ SOLN
INTRAMUSCULAR | Status: DC | PRN
  Administered 2018-03-10: 150 ug via INTRAVENOUS
  Administered 2018-03-10 (×3): 100 ug via INTRAVENOUS
  Administered 2018-03-10: 50 ug via INTRAVENOUS

## 2018-03-10 MED ORDER — ALBUMIN HUMAN 5 % IV SOLN
INTRAVENOUS | Status: DC | PRN
  Administered 2018-03-10: 13:00:00 via INTRAVENOUS

## 2018-03-10 MED ORDER — FAMOTIDINE IN NACL 20-0.9 MG/50ML-% IV SOLN
20.0000 mg | Freq: Two times a day (BID) | INTRAVENOUS | Status: DC
  Administered 2018-03-10: 20 mg via INTRAVENOUS

## 2018-03-10 MED ORDER — CHLORHEXIDINE GLUCONATE 0.12 % MT SOLN
15.0000 mL | Freq: Once | OROMUCOSAL | Status: AC
  Administered 2018-03-10: 15 mL via OROMUCOSAL
  Filled 2018-03-10: qty 15

## 2018-03-10 MED ORDER — ASPIRIN 81 MG PO CHEW: 324.0000 mg | CHEWABLE_TABLET | Freq: Every day | ORAL | Status: DC

## 2018-03-10 MED ORDER — DEXMEDETOMIDINE HCL IN NACL 200 MCG/50ML IV SOLN
0.0000 ug/kg/h | INTRAVENOUS | Status: DC
  Administered 2018-03-10: 0.1 ug/kg/h via INTRAVENOUS
  Administered 2018-03-10: 0.2 ug/kg/h via INTRAVENOUS
  Administered 2018-03-10: 0.5 ug/kg/h via INTRAVENOUS
  Filled 2018-03-10: qty 50

## 2018-03-10 MED ORDER — LACTATED RINGERS IV SOLN
INTRAVENOUS | Status: DC
  Administered 2018-03-10: 15:00:00 via INTRAVENOUS

## 2018-03-10 MED ORDER — ROCURONIUM BROMIDE 50 MG/5ML IV SOSY
PREFILLED_SYRINGE | INTRAVENOUS | Status: AC
  Filled 2018-03-10: qty 10

## 2018-03-10 MED ORDER — ACETAMINOPHEN 650 MG RE SUPP
650.0000 mg | Freq: Once | RECTAL | Status: AC
  Administered 2018-03-10: 650 mg via RECTAL

## 2018-03-10 MED ORDER — MAGNESIUM SULFATE 4 GM/100ML IV SOLN
4.0000 g | Freq: Once | INTRAVENOUS | Status: AC
  Administered 2018-03-10: 4 g via INTRAVENOUS
  Filled 2018-03-10: qty 100

## 2018-03-10 MED ORDER — LACTATED RINGERS IV SOLN
500.0000 mL | Freq: Once | INTRAVENOUS | Status: AC | PRN
  Administered 2018-03-11: 500 mL via INTRAVENOUS

## 2018-03-10 MED ORDER — BUPIVACAINE 0.5 % ON-Q PUMP SINGLE CATH 400 ML
INJECTION | Status: AC | PRN
  Administered 2018-03-10: 400 mL

## 2018-03-10 MED ORDER — HEPARIN SODIUM (PORCINE) 1000 UNIT/ML IJ SOLN
INTRAMUSCULAR | Status: AC
  Filled 2018-03-10: qty 1

## 2018-03-10 MED ORDER — PROPOFOL 10 MG/ML IV BOLUS
INTRAVENOUS | Status: DC | PRN
  Administered 2018-03-10: 50 mg via INTRAVENOUS
  Administered 2018-03-10: 150 mg via INTRAVENOUS

## 2018-03-10 MED ORDER — PHENYLEPHRINE HCL-NACL 20-0.9 MG/250ML-% IV SOLN
0.0000 ug/min | INTRAVENOUS | Status: DC
  Administered 2018-03-10: 10 ug/min via INTRAVENOUS
  Filled 2018-03-10: qty 250

## 2018-03-10 MED ORDER — SUGAMMADEX SODIUM 200 MG/2ML IV SOLN
INTRAVENOUS | Status: DC | PRN
  Administered 2018-03-10: 200 mg via INTRAVENOUS

## 2018-03-10 MED ORDER — VANCOMYCIN HCL IN DEXTROSE 1-5 GM/200ML-% IV SOLN
1000.0000 mg | Freq: Once | INTRAVENOUS | Status: AC
  Administered 2018-03-10: 1000 mg via INTRAVENOUS
  Filled 2018-03-10: qty 200

## 2018-03-10 MED ORDER — SODIUM CHLORIDE 0.9% FLUSH
3.0000 mL | Freq: Two times a day (BID) | INTRAVENOUS | Status: DC
  Administered 2018-03-11 – 2018-03-15 (×10): 3 mL via INTRAVENOUS

## 2018-03-10 MED ORDER — SODIUM BICARBONATE 8.4 % IV SOLN
50.0000 meq | Freq: Once | INTRAVENOUS | Status: AC
  Administered 2018-03-10: 50 meq via INTRAVENOUS

## 2018-03-10 MED ORDER — LIDOCAINE 2% (20 MG/ML) 5 ML SYRINGE
INTRAMUSCULAR | Status: AC
  Filled 2018-03-10: qty 5

## 2018-03-10 MED ORDER — ASPIRIN EC 325 MG PO TBEC: 325.0000 mg | DELAYED_RELEASE_TABLET | Freq: Every day | ORAL | Status: DC

## 2018-03-10 MED ORDER — 0.9 % SODIUM CHLORIDE (POUR BTL) OPTIME
TOPICAL | Status: DC | PRN
  Administered 2018-03-10: 4000 mL

## 2018-03-10 MED ORDER — SODIUM CHLORIDE 0.9 % IV SOLN
INTRAVENOUS | Status: DC | PRN
  Administered 2018-03-10: 750 mg via INTRAVENOUS

## 2018-03-10 MED ORDER — INSULIN REGULAR BOLUS VIA INFUSION
0.0000 [IU] | Freq: Three times a day (TID) | INTRAVENOUS | Status: DC
  Filled 2018-03-10: qty 10

## 2018-03-10 MED ORDER — HEPARIN SODIUM (PORCINE) 1000 UNIT/ML IJ SOLN
INTRAMUSCULAR | Status: DC | PRN
  Administered 2018-03-10: 24000 [IU] via INTRAVENOUS

## 2018-03-10 MED ORDER — MORPHINE SULFATE (PF) 2 MG/ML IV SOLN
1.0000 mg | INTRAVENOUS | Status: DC | PRN
  Administered 2018-03-10: 1 mg via INTRAVENOUS
  Administered 2018-03-10: 2 mg via INTRAVENOUS
  Filled 2018-03-10 (×2): qty 1

## 2018-03-10 MED ORDER — ACETAMINOPHEN 500 MG PO TABS
1000.0000 mg | ORAL_TABLET | Freq: Four times a day (QID) | ORAL | Status: AC
  Administered 2018-03-11 – 2018-03-15 (×20): 1000 mg via ORAL
  Filled 2018-03-10 (×20): qty 2

## 2018-03-10 MED ORDER — ROCURONIUM BROMIDE 50 MG/5ML IV SOSY
PREFILLED_SYRINGE | INTRAVENOUS | Status: AC
  Filled 2018-03-10: qty 5

## 2018-03-10 MED ORDER — MIDAZOLAM HCL 10 MG/2ML IJ SOLN
INTRAMUSCULAR | Status: AC
  Filled 2018-03-10: qty 2

## 2018-03-10 MED ORDER — FENTANYL CITRATE (PF) 250 MCG/5ML IJ SOLN
INTRAMUSCULAR | Status: AC
  Filled 2018-03-10: qty 25

## 2018-03-10 MED ORDER — LEVOTHYROXINE SODIUM 25 MCG PO TABS
25.0000 ug | ORAL_TABLET | Freq: Every day | ORAL | Status: DC
  Administered 2018-03-11 – 2018-03-16 (×6): 25 ug via ORAL
  Filled 2018-03-10 (×6): qty 1

## 2018-03-10 MED ORDER — METOPROLOL TARTRATE 12.5 MG HALF TABLET
12.5000 mg | ORAL_TABLET | Freq: Once | ORAL | Status: AC
  Administered 2018-03-10: 12.5 mg via ORAL
  Filled 2018-03-10: qty 1

## 2018-03-10 MED ORDER — ALBUMIN HUMAN 5 % IV SOLN
250.0000 mL | INTRAVENOUS | Status: AC | PRN
  Administered 2018-03-10 (×4): 12.5 g via INTRAVENOUS
  Filled 2018-03-10 (×2): qty 250

## 2018-03-10 SURGICAL SUPPLY — 108 items
ADAPTER CARDIO PERF ANTE/RETRO (ADAPTER) ×3 IMPLANT
BAG DECANTER FOR FLEXI CONT (MISCELLANEOUS) ×3 IMPLANT
BLADE STERNUM SYSTEM 6 (BLADE) ×3 IMPLANT
BLADE SURG 11 STRL SS (BLADE) ×3 IMPLANT
BLADE SURG ROTATE 9660 (MISCELLANEOUS) ×3 IMPLANT
CANISTER SUCT 3000ML PPV (MISCELLANEOUS) ×6 IMPLANT
CANNULA FEM VENOUS REMOTE 22FR (CANNULA) ×3 IMPLANT
CANNULA FEMORAL ART 14 SM (MISCELLANEOUS) ×3 IMPLANT
CANNULA GUNDRY RCSP 15FR (MISCELLANEOUS) ×3 IMPLANT
CANNULA OPTISITE PERFUSION 16F (CANNULA) IMPLANT
CANNULA OPTISITE PERFUSION 18F (CANNULA) ×3 IMPLANT
CANNULA SUMP PERICARDIAL (CANNULA) ×6 IMPLANT
CATH KIT ON Q 5IN SLV (PAIN MANAGEMENT) IMPLANT
CATH KIT ON-Q SILVERSOAK 5IN (CATHETERS) ×3 IMPLANT
CELLS DAT CNTRL 66122 CELL SVR (MISCELLANEOUS) ×2 IMPLANT
CONN ST 1/4X3/8  BEN (MISCELLANEOUS) ×4
CONN ST 1/4X3/8 BEN (MISCELLANEOUS) ×8 IMPLANT
CONNECTOR 1/2X3/8X1/2 3 WAY (MISCELLANEOUS) ×1
CONNECTOR 1/2X3/8X1/2 3WAY (MISCELLANEOUS) ×2 IMPLANT
CONT SPEC 4OZ CLIKSEAL STRL BL (MISCELLANEOUS) ×3 IMPLANT
COVER BACK TABLE 24X17X13 BIG (DRAPES) ×3 IMPLANT
COVER WAND RF STERILE (DRAPES) ×3 IMPLANT
CRADLE DONUT ADULT HEAD (MISCELLANEOUS) ×3 IMPLANT
DERMABOND ADVANCED (GAUZE/BANDAGES/DRESSINGS) ×1
DERMABOND ADVANCED .7 DNX12 (GAUZE/BANDAGES/DRESSINGS) ×2 IMPLANT
DEVICE PMI PUNCTURE CLOSURE (MISCELLANEOUS) ×3 IMPLANT
DEVICE SUT CK QUICK LOAD MINI (Prosthesis & Implant Heart) ×6 IMPLANT
DEVICE TROCAR PUNCTURE CLOSURE (ENDOMECHANICALS) ×3 IMPLANT
DRAIN CHANNEL 28F RND 3/8 FF (WOUND CARE) ×6 IMPLANT
DRAPE BILATERAL SPLIT (DRAPES) ×3 IMPLANT
DRAPE C-ARM 42X72 X-RAY (DRAPES) ×3 IMPLANT
DRAPE CV SPLIT W-CLR ANES SCRN (DRAPES) ×3 IMPLANT
DRAPE INCISE IOBAN 66X45 STRL (DRAPES) ×9 IMPLANT
DRAPE SLUSH/WARMER DISC (DRAPES) ×3 IMPLANT
DRSG AQUACEL AG ADV 3.5X10 (GAUZE/BANDAGES/DRESSINGS) ×3 IMPLANT
DRSG COVADERM 4X8 (GAUZE/BANDAGES/DRESSINGS) ×3 IMPLANT
ELECT BLADE 6.5 EXT (BLADE) ×3 IMPLANT
ELECT REM PT RETURN 9FT ADLT (ELECTROSURGICAL) ×6
ELECTRODE REM PT RTRN 9FT ADLT (ELECTROSURGICAL) ×4 IMPLANT
FELT TEFLON 1X6 (MISCELLANEOUS) ×6 IMPLANT
FEMORAL VENOUS CANN RAP (CANNULA) IMPLANT
GAUZE SPONGE 4X4 12PLY STRL (GAUZE/BANDAGES/DRESSINGS) ×3 IMPLANT
GAUZE SPONGE 4X4 12PLY STRL LF (GAUZE/BANDAGES/DRESSINGS) ×3 IMPLANT
GLOVE BIO SURGEON STRL SZ 6 (GLOVE) ×9 IMPLANT
GLOVE BIO SURGEON STRL SZ 6.5 (GLOVE) ×15 IMPLANT
GLOVE ORTHO TXT STRL SZ7.5 (GLOVE) ×9 IMPLANT
GOWN STRL REUS W/ TWL LRG LVL3 (GOWN DISPOSABLE) ×16 IMPLANT
GOWN STRL REUS W/TWL LRG LVL3 (GOWN DISPOSABLE) ×8
IV NS 1000ML (IV SOLUTION) ×1
IV NS 1000ML BAXH (IV SOLUTION) ×2 IMPLANT
IV NS IRRIG 3000ML ARTHROMATIC (IV SOLUTION) ×6 IMPLANT
KIT BASIN OR (CUSTOM PROCEDURE TRAY) ×3 IMPLANT
KIT DILATOR VASC 18G NDL (KITS) ×3 IMPLANT
KIT DRAINAGE VACCUM ASSIST (KITS) ×3 IMPLANT
KIT SUCTION CATH 14FR (SUCTIONS) ×3 IMPLANT
KIT SUT CK MINI COMBO 4X17 (Prosthesis & Implant Heart) ×3 IMPLANT
KIT TURNOVER KIT B (KITS) ×3 IMPLANT
LEAD PACING MYOCARDI (MISCELLANEOUS) ×3 IMPLANT
LINE VENT (MISCELLANEOUS) ×3 IMPLANT
NDL SUT 1 .5 CRC FRENCH EYE (NEEDLE) ×2 IMPLANT
NEEDLE AORTIC ROOT 14G 7F (CATHETERS) ×3 IMPLANT
NEEDLE FRENCH EYE (NEEDLE) ×1
NS IRRIG 1000ML POUR BTL (IV SOLUTION) ×15 IMPLANT
PACK OPEN HEART (CUSTOM PROCEDURE TRAY) ×3 IMPLANT
PAD ARMBOARD 7.5X6 YLW CONV (MISCELLANEOUS) ×6 IMPLANT
PAD ELECT DEFIB RADIOL ZOLL (MISCELLANEOUS) ×3 IMPLANT
RING MITRAL MEMO 4D 30 (Prosthesis & Implant Heart) ×3 IMPLANT
RTRCTR WOUND ALEXIS 18CM MED (MISCELLANEOUS) ×3
SET CANNULATION TOURNIQUET (MISCELLANEOUS) ×3 IMPLANT
SET CARDIOPLEGIA MPS 5001102 (MISCELLANEOUS) ×3 IMPLANT
SET IRRIG TUBING LAPAROSCOPIC (IRRIGATION / IRRIGATOR) ×3 IMPLANT
SOLUTION ANTI FOG 6CC (MISCELLANEOUS) ×3 IMPLANT
SUT BONE WAX W31G (SUTURE) ×3 IMPLANT
SUT E-PACK MINIMALLY INVASIVE (SUTURE) ×3 IMPLANT
SUT ETHIBOND (SUTURE) ×6 IMPLANT
SUT ETHIBOND 2 0 SH (SUTURE) ×3 IMPLANT
SUT ETHIBOND 2-0 RB-1 WHT (SUTURE) ×6 IMPLANT
SUT ETHIBOND X763 2 0 SH 1 (SUTURE) ×3 IMPLANT
SUT GORETEX CV 4 TH 22 36 (SUTURE) ×3 IMPLANT
SUT GORETEX CV-5THC-13 36IN (SUTURE) ×21 IMPLANT
SUT GORETEX CV4 TH-18 (SUTURE) ×15 IMPLANT
SUT PROLENE 3 0 SH1 36 (SUTURE) ×12 IMPLANT
SUT PROLENE 4 0 RB 1 (SUTURE) ×3
SUT PROLENE 4-0 RB1 .5 CRCL 36 (SUTURE) ×6 IMPLANT
SUT PROLENE 5 0 C 1 36 (SUTURE) ×3 IMPLANT
SUT PROLENE 6 0 C 1 30 (SUTURE) ×12 IMPLANT
SUT PTFE CHORD X 20MM (SUTURE) ×3 IMPLANT
SUT SILK  1 MH (SUTURE) ×1
SUT SILK 1 MH (SUTURE) ×2 IMPLANT
SUT SILK 2 0 SH CR/8 (SUTURE) IMPLANT
SUT SILK 3 0 SH CR/8 (SUTURE) IMPLANT
SUT VIC AB 2-0 CTX 36 (SUTURE) IMPLANT
SUT VIC AB 3-0 SH 8-18 (SUTURE) ×3 IMPLANT
SUT VICRYL 2 TP 1 (SUTURE) IMPLANT
SYR 10ML LL (SYRINGE) ×3 IMPLANT
SYSTEM SAHARA CHEST DRAIN ATS (WOUND CARE) ×3 IMPLANT
TAPE CLOTH SURG 4X10 WHT LF (GAUZE/BANDAGES/DRESSINGS) ×3 IMPLANT
TAPE PAPER 2X10 WHT MICROPORE (GAUZE/BANDAGES/DRESSINGS) ×3 IMPLANT
TOWEL GREEN STERILE (TOWEL DISPOSABLE) ×3 IMPLANT
TOWEL GREEN STERILE FF (TOWEL DISPOSABLE) ×3 IMPLANT
TRAY FOLEY SLVR 16FR TEMP STAT (SET/KITS/TRAYS/PACK) ×3 IMPLANT
TROCAR XCEL BLADELESS 5X75MML (TROCAR) ×3 IMPLANT
TROCAR XCEL NON-BLD 11X100MML (ENDOMECHANICALS) ×6 IMPLANT
TUBE SUCT INTRACARD DLP 20F (MISCELLANEOUS) ×3 IMPLANT
TUNNELER SHEATH ON-Q 11GX8 DSP (PAIN MANAGEMENT) ×3 IMPLANT
UNDERPAD 30X30 (UNDERPADS AND DIAPERS) ×3 IMPLANT
WATER STERILE IRR 1000ML POUR (IV SOLUTION) ×6 IMPLANT
WIRE .035 3MM-J 145CM (WIRE) ×3 IMPLANT

## 2018-03-10 NOTE — Anesthesia Postprocedure Evaluation (Signed)
Anesthesia Post Note  Patient: Maria Gomez  Procedure(s) Performed: MINIMALLY INVASIVE MITRAL VALVE REPAIR (MVR):  -Triangular Resection of Posterior Leaflet P2; -Shortening Plasty of Posterior Leaflet P2; -Placement of Goretex Neo Chords x 6 using Chord-X system; -Ring Annuloplasty with a 30 mm Sorin Memo 4D Ring (Right Chest) TRANSESOPHAGEAL ECHOCARDIOGRAM (TEE) (N/A )     Patient location during evaluation: SICU Anesthesia Type: General Level of consciousness: sedated and patient remains intubated per anesthesia plan Pain management: pain level controlled Vital Signs Assessment: post-procedure vital signs reviewed and stable Respiratory status: patient remains intubated per anesthesia plan and patient on ventilator - see flowsheet for VS Cardiovascular status: stable Postop Assessment: no apparent nausea or vomiting Anesthetic complications: no    Last Vitals:  Vitals:   03/10/18 1830 03/10/18 1845  BP:  90/79  Pulse: 90 90  Resp: 16 19  Temp: 36.9 C 36.7 C  SpO2: 98% 100%    Last Pain:  Vitals:   03/10/18 1823  TempSrc:   PainSc: 3                  Olen Eaves COKER

## 2018-03-10 NOTE — Brief Op Note (Signed)
03/10/2018  2:21 PM  PATIENT:  Maria Gomez  72 y.o. female  PRE-OPERATIVE DIAGNOSIS:  MR  POST-OPERATIVE DIAGNOSIS:  MR  PROCEDURE:  Procedure(s) with comments:  MINIMALLY INVASIVE MITRAL VALVE REPAIR  -Triangular Resection of Posterior Leaflet P2 -Shortening Plasty of Posterior Leaflet P2 -Placement of Goretex Neo Chords x 6 using Chord-x system -Ring Annuloplasty with a 30 mm Sorin Memo 4D Ring  TRANSESOPHAGEAL ECHOCARDIOGRAM (TEE) (N/A)  SURGEON:  Surgeon(s) and Role:    Rexene Alberts, MD - Primary  PHYSICIAN ASSISTANT: Erin Barrett PA-C  ANESTHESIA:   general  EBL:  1000 mL   BLOOD ADMINISTERED: CELLSAVER  DRAINS: Chest Tubes Right Chest   LOCAL MEDICATIONS USED:  BUPIVICAINE   SPECIMEN:  Source of Specimen:  Portion of Posterior Leaflet  DISPOSITION OF SPECIMEN:  PATHOLOGY  COUNTS:  YES  TOURNIQUET:  * No tourniquets in log *  DICTATION: .Dragon Dictation  PLAN OF CARE: Admit to inpatient   PATIENT DISPOSITION:  ICU - intubated and hemodynamically stable.   Delay start of Pharmacological VTE agent (>24hrs) due to surgical blood loss or risk of bleeding: yes

## 2018-03-10 NOTE — Anesthesia Procedure Notes (Signed)
Procedure Name: Intubation Date/Time: 03/10/2018 8:10 AM Performed by: Roberts Gaudy, MD Pre-anesthesia Checklist: Patient identified, Emergency Drugs available, Suction available, Patient being monitored and Timeout performed Patient Re-evaluated:Patient Re-evaluated prior to induction Oxygen Delivery Method: Circle System Utilized Preoxygenation: Pre-oxygenation with 100% oxygen Induction Type: IV induction Ventilation: Mask ventilation without difficulty and Oral airway inserted - appropriate to patient size Laryngoscope Size: Mac and 3 Tube type: Oral Endobronchial tube: EBT position confirmed by fiberoptic bronchoscope, Double lumen EBT and Left and 35 Fr Number of attempts: 1 Airway Equipment and Method: Stylet and Oral airway Placement Confirmation: ETT inserted through vocal cords under direct vision,  positive ETCO2 and breath sounds checked- equal and bilateral Secured at: 27 cm Tube secured with: Tape Dental Injury: Teeth and Oropharynx as per pre-operative assessment

## 2018-03-10 NOTE — Transfer of Care (Addendum)
Immediate Anesthesia Transfer of Care Note  Patient: Maria Gomez  Procedure(s) Performed: MINIMALLY INVASIVE MITRAL VALVE REPAIR (MVR):  -Triangular Resection of Posterior Leaflet P2; -Shortening Plasty of Posterior Leaflet P2; -Placement of Goretex Neo Chords x 6 using Chord-X system; -Ring Annuloplasty with a 30 mm Sorin Memo 4D Ring (Right Chest) TRANSESOPHAGEAL ECHOCARDIOGRAM (TEE) (N/A )  Patient Location: SICU  Anesthesia Type:General  Level of Consciousness: Patient remains intubated per anesthesia plan  Airway & Oxygen Therapy: Patient remains intubated per anesthesia plan and Patient placed on Ventilator (see vital sign flow sheet for setting)  Post-op Assessment: Report given to RN and Post -op Vital signs reviewed and stable  Post vital signs: Reviewed and stable  Last Vitals:  Vitals Value Taken Time  BP    Temp 34.9 C 03/10/2018  3:28 PM  Pulse 80 03/10/2018  3:28 PM  Resp 16 03/10/2018  3:28 PM  SpO2 96 % 03/10/2018  3:28 PM  Vitals shown include unvalidated device data.  Last Pain:  Vitals:   03/10/18 0603  TempSrc: Oral  PainSc:          Complications: No apparent anesthesia complications

## 2018-03-10 NOTE — Procedures (Signed)
Extubation Procedure Note  Patient Details:   Name: Maria Gomez DOB: Jan 16, 1946 MRN: [id number]   Airway Documentation:    Vent end date: 03/10/18 Vent end time: 1830   Evaluation  O2 sats: stable throughout Complications: No apparent complications Patient did tolerate procedure well. Bilateral Breath Sounds: Diminished   Yes   Patient extubated to 2L nasal cannula.  Positive cuff leak noted.  No evidence of stridor.  NIF of -20, VC of 800.  Incentive spirometry performed x5 with achieved goal of 600.  Patient able to speak post extubation.  No complications noted.   Philomena Doheny 03/10/2018, 7:18 PM

## 2018-03-10 NOTE — Anesthesia Procedure Notes (Signed)
Central Venous Catheter Insertion Performed by: Roberts Gaudy, MD, anesthesiologist Start/End10/17/2019 6:55 AM, 03/10/2018 7:05 AM Patient location: Pre-op. Preanesthetic checklist: patient identified, IV checked, site marked, risks and benefits discussed, surgical consent, monitors and equipment checked, pre-op evaluation, timeout performed and anesthesia consent Hand hygiene performed  and maximum sterile barriers used  PA cath was placed.Swan type:thermodilution Procedure performed without using ultrasound guided technique. Attempts: 1 Following insertion, line sutured, dressing applied and Biopatch. Post procedure assessment: blood return through all ports  Patient tolerated the procedure well with no immediate complications.

## 2018-03-10 NOTE — Interval H&P Note (Signed)
History and Physical Interval Note:  03/10/2018 5:40 AM  [name]  has presented today for surgery, with the diagnosis of MR  The various methods of treatment have been discussed with the patient and family. After consideration of risks, benefits and other options for treatment, the patient has consented to  Procedure(s) with comments: [name] (MVR) (Right) - GLUTARALDEHYDE TRANSESOPHAGEAL ECHOCARDIOGRAM (TEE) (N/A) as a surgical intervention .  The patient's history has been reviewed, patient examined, no change in status, stable for surgery.  I have reviewed the patient's chart and labs.  Questions were answered to the patient's satisfaction.     Maria Gomez

## 2018-03-10 NOTE — Progress Notes (Signed)
      [address]       [place] [address]             [phone]      S/p MV repair  Extubated  BP 91/70   Pulse 89   Temp 98.2 F (36.8 C)   Resp 17   Ht 5\' 1"  (1.549 m)   Wt 72.7 kg   SpO2 100%   BMI 30.28 kg/m   Intake/Output Summary (Last 24 hours) at 03/10/2018 2113 Last data filed at 03/10/2018 2000 Gross per 24 hour  Intake 3762.02 ml  Output 3869 ml  Net -106.98 ml   Minimal CT output  Remo Lipps C. Roxan Hockey, MD [facility] [phone]

## 2018-03-10 NOTE — Anesthesia Preprocedure Evaluation (Signed)
Anesthesia Evaluation  Patient identified by MRN, date of birth, ID band Patient awake    Reviewed: Allergy & Precautions, NPO status , Patient's Chart, lab work & pertinent test results  Airway Mallampati: II  TM Distance: >3 FB Neck ROM: Full    Dental  (+) Teeth Intact, Dental Advisory Given   Pulmonary    breath sounds clear to auscultation       Cardiovascular  Rhythm:Regular Rate:Normal + Systolic murmurs    Neuro/Psych    GI/Hepatic   Endo/Other    Renal/GU      Musculoskeletal   Abdominal   Peds  Hematology   Anesthesia Other Findings   Reproductive/Obstetrics                             Anesthesia Physical Anesthesia Plan  ASA: III  Anesthesia Plan: General   Post-op Pain Management:    Induction: Intravenous  PONV Risk Score and Plan: Ondansetron and Dexamethasone  Airway Management Planned: Double Lumen EBT  Additional Equipment:   Intra-op Plan:   Post-operative Plan: Possible Post-op intubation/ventilation  Informed Consent: I have reviewed the patients History and Physical, chart, labs and discussed the procedure including the risks, benefits and alternatives for the proposed anesthesia with the patient or authorized representative who has indicated his/her understanding and acceptance.   Dental advisory given  Plan Discussed with: CRNA and Anesthesiologist  Anesthesia Plan Comments:         Anesthesia Quick Evaluation

## 2018-03-10 NOTE — Anesthesia Procedure Notes (Signed)
Arterial Line Insertion Start/End10/17/2019 7:10 AM, 03/10/2018 7:20 AM Performed by: Roberts Gaudy, MD, anesthesiologist  Patient location: Pre-op. Preanesthetic checklist: patient identified, IV checked, site marked, risks and benefits discussed, surgical consent, monitors and equipment checked, pre-op evaluation and timeout performed Left, brachial was placed Catheter size: 20 G  Attempts: 1 Procedure performed using ultrasound guided technique. Ultrasound Notes:anatomy identified, needle tip was noted to be adjacent to the nerve/plexus identified and no ultrasound evidence of intravascular and/or intraneural injection Following insertion, dressing applied and Biopatch. Patient tolerated the procedure well with no immediate complications.

## 2018-03-10 NOTE — Progress Notes (Signed)
  Echocardiogram Echocardiogram Transesophageal has been performed.  Maria Gomez 03/10/2018, 9:33 AM

## 2018-03-10 NOTE — Progress Notes (Signed)
RT note: rapid wean protocol initiated at 17:00.  Currently tolerating well.  Will continue to monitor.

## 2018-03-10 NOTE — Op Note (Addendum)
CARDIOTHORACIC SURGERY OPERATIVE NOTE  Date of Procedure:  03/10/2018  Preoperative Diagnosis: Severe Mitral Regurgitation  Postoperative Diagnosis: Same  Procedure:    Minimally-Invasive Mitral Valve Repair  Complex valvuloplasty including triangular resection of flail segment (P2) of posterior leaflet  Shortening plasty of posterior leaflet  Artificial Gore-tex neochord placement x6  Sorin Memo 4D Ring Annuloplasty (size 61mm, catalog # [id number], serial # [id number])    Surgeon: Valentina Gu. Roxy Manns, MD  Assistant: Ellwood Handler, PA-C  Anesthesia: Roberts Gaudy, MD  Operative Findings:  Fibroelastic deficiency type mxyomatous degenerative disease  Ruptured primary chordae tendinae with flail segment (P2) of posterior leaflet  Type II dysfunction with severe mitral regurgitation  Normal left ventricular systolic function  Trivial residual mitral regurgitation after successful valve repair                      BRIEF CLINICAL NOTE AND INDICATIONS FOR SURGERY  Patient is a 72 year old female who has been referred for surgical consultation to discuss treatment options for management of mitral valve prolapse with stage D severe symptomatic primary mitral regurgitation.  Patient has history of hypothyroidism and mild osteopenia but essentially has been healthy essentially all of her adult life. She was recently noted to have a systolic murmur on physical exam and was referred for cardiology consultation. She was initially evaluated by Tamala Julian more recently by Dr. [place]. Transthoracic echocardiogram performed September 20, 2017 revealed normal left ventricular systolic function with ejection fraction estimated 55 to 60%. There was mitral valve prolapse with at least moderately severe mitral regurgitation. Transesophageal echocardiogram performed November 09, 2017 confirmed the presence of bileaflet prolapse of the mitral valve with partially flail segment involving the  middle scallop of the posterior leaflet. There was felt to be severe mitral regurgitation although quantification using PISAwas not possible because of the extremely eccentric jet of regurgitation. There was mild left atrial enlargement and normal left ventricular size and systolic function. No other significant abnormalities were noted. Diagnostic cardiac catheterization was performed January 18, 2018. The patient was found to have normal coronary arteries with no significant coronary artery disease. There was severe mitral regurgitation with normal right heart pressures and normal left heart filling pressures. Cardiothoracic surgical consultation was requested.  The patient has been seen in consultation and counseled at length regarding the indications, risks and potential benefits of surgery.  All questions have been answered, and the patient provides full informed consent for the operation as described.    DETAILS OF THE OPERATIVE PROCEDURE  Preparation:  The patient is brought to the operating room on the above mentioned date and central monitoring was established by the anesthesia team including placement of Swan-Ganz catheter through the left internal jugular vein.  A radial arterial line is placed. The patient is placed in the supine position on the operating table.  Intravenous antibiotics are administered. General endotracheal anesthesia is induced uneventfully. The patient is initially intubated using a dual lumen endotracheal tube.  A Foley catheter is placed.  Baseline transesophageal echocardiogram was performed.  Findings were notable for myxomatous degenerative disease with prolapse involving a portion of the middle scallop (P2) of the posterior leaflet.  There is an obvious ruptured primary chordae tendinae.  There is severe mitral regurgitation.  There is normal left ventricular systolic function.  No other significant abnormalities were noted.  A soft roll is placed behind the  patient's left scapula and the neck gently extended and turned to the left.   The patient's  right neck, chest, abdomen, both groins, and both lower extremities are prepared and draped in a sterile manner. A time out procedure is performed.  Surgical Approach:  A right miniature anterolateral thoracotomy incision is performed. The incision is placed just lateral to and superior to the right nipple. The pectoralis major muscle is retracted medially and completely preserved. The right pleural space is entered through the 3rd intercostal space. A soft tissue retractor is placed.  Two 11 mm ports are placed through separate stab incisions inferiorly. The right pleural space is insufflated continuously with carbon dioxide gas through the posterior port during the remainder of the operation.  A pledgeted sutures placed through the dome of the right hemidiaphragm and retracted inferiorly to facilitate exposure.  A longitudinal incision is made in the pericardium 3 cm anterior to the phrenic nerve and silk traction sutures are placed on either side of the incision for exposure.   Extracorporeal Cardiopulmonary Bypass and Myocardial Protection:  A small incision is made in the right inguinal crease and the anterior surface of the right common femoral artery and right common femoral vein are identified.  The patient is placed in Trendelenburg position. The right internal jugular vein is cannulated with Seldinger technique and a guidewire advanced into the right atrium. The patient is heparinized systemically. The right internal jugular vein is cannulated with a 14 [place] pediatric femoral venous cannula. Pursestring sutures are placed on the anterior surface of the right common femoral vein and right common femoral artery. The right common femoral vein is cannulated with the Seldinger technique and a guidewire is advanced under transesophageal echocardiogram guidance through the right atrium. The femoral vein is  cannulated with a long 22 French femoral venous cannula. The right common femoral artery is cannulated with Seldinger technique and a flexible guidewire is advanced until it can be appreciated intraluminally in the descending thoracic aorta on transesophageal echocardiogram. The femoral artery is cannulated with an 18 French femoral arterial cannula.  Adequate heparinization is verified.     The entire pre-bypass portion of the operation was notable for stable hemodynamics.  Cardiopulmonary bypass was begun.  Vacuum assist venous drainage is utilized. The incision in the pericardium is extended in both directions. Venous drainage and exposure are notably excellent. A retrograde cardioplegia cannula is placed through the right atrium into the coronary sinus using transesophageal echocardiogram guidance.  An antegrade cardioplegia cannula is placed in the ascending aorta.    The patient is cooled to 28C systemic temperature.  The aortic cross clamp is applied and cardioplegia is delivered initially in an antegrade fashion through the aortic root using modified [place] cold blood cardioplegia ([place] blood cardioplegia protocol).   The initial cardioplegic arrest is rapid with early diastolic arrest.  Repeat doses of cardioplegia are administered at 90 minutes and every 30 minutes thereafter through the coronary sinus catheter in order to maintain completely flat electrocardiogram.  Myocardial protection was felt to be excellent.   Mitral Valve Repair:  A left atriotomy incision was performed through the interatrial groove and extended partially across the back wall of the left atrium after opening the oblique sinus inferiorly.  The mitral valve is exposed using a self-retaining retractor.  The mitral valve was inspected and notable for fibroelastic deficiency type myxomatous degenerative disease of the mitral valve.  There is billowing and prolapse of the middle scallop (P2) of the posterior leaflet.   The middle scallop is notably bifid and the segment closest to the anterior commissure is  flail with ruptured primary chordae tendinae.  The segment closest to the posterior commissure is not flail.  Interrupted 2-0 Ethibond horizontal mattress sutures are placed circumferentially around the entire mitral valve annulus. The sutures will ultimately be utilized for ring annuloplasty, and at this juncture there are utilized to suspend the valve symmetrically.  The flail segment of P2 associated with the anterior commissure is repaired using a triangular resection and artificial Gore-Tex neochords.  A small triangular resection is made, comprising less than 20% of the total surface area of P2.  This segment of P2 was also shortened using small triangular resections at the base of the leaflet.  These are reapproximated to the posterior annulus with horizontal mattress CV 4 Gore-Tex suture and the intervening vertical defect in the leaflet is closed with interrupted everting simple CV 5 Gore-Tex suture.  Artificial neochord placement was performed using Chord-X multi-strand CV-4 Goretex pre-measured loops.  The appropriate cord length was measured from corresponding normal length primary cords from the P1 segment of the posterior leaflet. The papillary muscle suture of the Chord-X multi-strand suture was placed through the head of the anterior papillary muscle in a horizontal mattress fashion and tied over Teflon felt pledgets. Each of the three pre-measured loops were then reimplanted into the free margin of the P2 segment of the posterior leaflet.    The valve was tested with saline and appeared competent even without ring annuloplasty complete. The valve was sized to a 30 mm annuloplasty ring, based upon the transverse distance between the left and right commissures and the height of the anterior leaflet, corresponding to a size just slightly larger than the overall surface area of the anterior leaflet.  A Sorin  Memo 4D annuloplasty ring (size 30mm, catalog #[id number], serial [id number]) was secured in place uneventfully. All ring sutures were secured using a Cor-knot device.    The valve was tested with saline and appeared competent. There is no residual leak. There was a broad, symmetrical line of coaptation of the anterior and posterior leaflet which was confirmed using the blue ink test.  Rewarming is begun.   Procedure Completion:  The atriotomy was closed using a 2-layer closure of running 3-0 Prolene suture after placing a sump drain across the mitral valve to serve as a left ventricular vent.  One final dose of warm retrograde "reanimation dose" cardioplegia was administered retrograde through the coronary sinus catheter while all air was evacuated through the aortic root.  The aortic cross clamp was removed after a total cross clamp time of 138 minutes.  Epicardial pacing wires are fixed to the inferior wall of the right ventricule and to the right atrial appendage. The patient is rewarmed to 37C temperature. The left ventricular vent is removed.  The patient is ventilated and flow volumes turndown while the mitral valve repair is inspected using transesophageal echocardiogram. The valve repair appears intact with no residual leak. The antegrade cardioplegia cannula is now removed. The patient is weaned and disconnected from cardiopulmonary bypass.  The patient's rhythm at separation from bypass was sinus bradycardia.  The patient was weaned from bypass without any inotropic support. Total cardiopulmonary bypass time for the operation was 187 minutes.  Followup transesophageal echocardiogram performed after separation from bypass revealed a well-seated annuloplasty ring in the mitral position with a normal functioning mitral valve. There was trivial residual leak.  Left ventricular function was unchanged from preoperatively.  The mean gradient across the mitral valve was estimated to be 2 mmHg.  The  femoral  arterial and venous cannulae were removed uneventfully. There was a palpable pulse in the distal right common femoral artery after removal of the cannula. Protamine was administered to reverse the anticoagulation. The right internal jugular cannula was removed and manual pressure held on the neck for 15 minutes.  Single lung ventilation was begun. The atriotomy closure was inspected for hemostasis. The pericardial sac was drained using a 28 French Bard drain placed through the anterior port incision.  The right pleural space is irrigated with saline solution and inspected for hemostasis.   The On-Q pain management system is utilized for postoperative analgesia.  A single lumen catheter is passed through the subcutaneous tissues from the anterior chest wall to the posterior port incision.  The catheter was then passed through the port incision into the pleural space and tunneled into the subpleural space posteriorly to cover the second through the sixth intercostal nerve roots.  The catheter was flushed with 0.5% bupivacaine solution and ultimately connected to a continuous infusion pump.  The right pleural space was drained using a 28 French Bard drain placed through the posterior port incision. The miniature thoracotomy incision was closed in multiple layers in routine fashion. The right groin incision was inspected for hemostasis and closed in multiple layers in routine fashion.  The post-bypass portion of the operation was notable for stable rhythm and hemodynamics.  No blood products were administered during the operation.   Disposition:  The patient tolerated the procedure well.  The patient was reitubated using a single lumen tube and subsequently transported to the surgical intensive care unit in stable condition. There were no intraoperative complications. All sponge instrument and needle counts are verified correct at completion of the operation.     Valentina Gu. Roxy Manns MD 03/10/2018 2:25  PM

## 2018-03-10 NOTE — Anesthesia Procedure Notes (Signed)
Procedure Name: Intubation Date/Time: 03/10/2018 2:55 PM Performed by: Alain Marion, CRNA Pre-anesthesia Checklist: Patient identified, Emergency Drugs available, Suction available and Patient being monitored Patient Re-evaluated:Patient Re-evaluated prior to induction Oxygen Delivery Method: Circle system utilized Preoxygenation: Pre-oxygenation with 100% oxygen Induction Type: IV induction Laryngoscope Size: Glidescope and 4 Grade View: Grade I Tube type: Oral Tube size: 7.5 mm Number of attempts: 1 Intubation method: Cook exchange catheter. Placement Confirmation: ETT inserted through vocal cords under direct vision,  positive ETCO2 and breath sounds checked- equal and bilateral Secured at: 21 cm Tube secured with: Tape Dental Injury: Teeth and Oropharynx as per pre-operative assessment  Comments: DLT exchanged for single ETT.

## 2018-03-10 NOTE — Anesthesia Procedure Notes (Signed)
Central Venous Catheter Insertion Performed by: Roberts Gaudy, MD, anesthesiologist Start/End10/17/2019 6:55 AM, 03/10/2018 7:05 AM Patient location: Pre-op. Preanesthetic checklist: patient identified, IV checked, site marked, risks and benefits discussed, surgical consent, monitors and equipment checked, pre-op evaluation, timeout performed and anesthesia consent Lidocaine 1% used for infiltration and patient sedated Hand hygiene performed  and maximum sterile barriers used  Catheter size: 8.5 Fr Sheath introducer Procedure performed using ultrasound guided technique. Ultrasound Notes:anatomy identified, needle tip was noted to be adjacent to the nerve/plexus identified, no ultrasound evidence of intravascular and/or intraneural injection and image(s) printed for medical record Attempts: 1 Following insertion, line sutured and dressing applied. Post procedure assessment: blood return through all ports, free fluid flow and no air  Patient tolerated the procedure well with no immediate complications.

## 2018-03-10 NOTE — Progress Notes (Signed)
Paged Dr. Roxy Manns to give results of EKG & ABG after pt on vent for 85min upon arrival to unit. Verbal orders to give 1amp Bicarb STAT & begin weaning.

## 2018-03-11 ENCOUNTER — Encounter (HOSPITAL_COMMUNITY): Payer: Self-pay | Admitting: Thoracic Surgery (Cardiothoracic Vascular Surgery)

## 2018-03-11 ENCOUNTER — Other Ambulatory Visit: Payer: Self-pay

## 2018-03-11 ENCOUNTER — Telehealth: Payer: Self-pay | Admitting: Adult Health

## 2018-03-11 ENCOUNTER — Inpatient Hospital Stay (HOSPITAL_COMMUNITY): Payer: Medicare HMO

## 2018-03-11 LAB — POCT I-STAT, CHEM 8
BUN: 15 mg/dL (ref 8–23)
CREATININE: 0.9 mg/dL (ref 0.44–1.00)
Calcium, Ion: 1.07 mmol/L — ABNORMAL LOW (ref 1.15–1.40)
Chloride: 106 mmol/L (ref 98–111)
Glucose, Bld: 110 mg/dL — ABNORMAL HIGH (ref 70–99)
HCT: 27 % — ABNORMAL LOW (ref 36.0–46.0)
Hemoglobin: 9.2 g/dL — ABNORMAL LOW (ref 12.0–15.0)
Potassium: 4 mmol/L (ref 3.5–5.1)
SODIUM: 140 mmol/L (ref 135–145)
TCO2: 19 mmol/L — AB (ref 22–32)

## 2018-03-11 LAB — CBC
HCT: 31.2 % — ABNORMAL LOW (ref 36.0–46.0)
HCT: 29.2 % — ABNORMAL LOW (ref 36.0–46.0)
HEMOGLOBIN: 9.2 g/dL — AB (ref 12.0–15.0)
Hemoglobin: 10 g/dL — ABNORMAL LOW (ref 12.0–15.0)
MCH: 30.3 pg (ref 26.0–34.0)
MCH: 30.4 pg (ref 26.0–34.0)
MCHC: 32.1 g/dL (ref 30.0–36.0)
MCHC: 31.5 g/dL (ref 30.0–36.0)
MCV: 94.5 fL (ref 80.0–100.0)
MCV: 96.4 fL (ref 80.0–100.0)
NRBC: 0 % (ref 0.0–0.2)
PLATELETS: 99 10*3/uL — AB (ref 150–400)
PLATELETS: 96 10*3/uL — AB (ref 150–400)
RBC: 3.3 MIL/uL — AB (ref 3.87–5.11)
RBC: 3.03 MIL/uL — AB (ref 3.87–5.11)
RDW: 13.2 % (ref 11.5–15.5)
RDW: 13.4 % (ref 11.5–15.5)
WBC: 14.4 10*3/uL — AB (ref 4.0–10.5)
WBC: 12.9 10*3/uL — ABNORMAL HIGH (ref 4.0–10.5)
nRBC: 0 % (ref 0.0–0.2)

## 2018-03-11 LAB — BASIC METABOLIC PANEL
ANION GAP: 9 (ref 5–15)
BUN: 12 mg/dL (ref 8–23)
CALCIUM: 7.5 mg/dL — AB (ref 8.9–10.3)
CO2: 19 mmol/L — ABNORMAL LOW (ref 22–32)
Chloride: 112 mmol/L — ABNORMAL HIGH (ref 98–111)
Creatinine, Ser: 0.94 mg/dL (ref 0.44–1.00)
GFR, EST NON AFRICAN AMERICAN: 59 mL/min — AB (ref >60)
Glucose, Bld: 124 mg/dL — ABNORMAL HIGH (ref 70–99)
POTASSIUM: 3.7 mmol/L (ref 3.5–5.1)
Sodium: 140 mmol/L (ref 135–145)

## 2018-03-11 LAB — CREATININE, SERUM
CREATININE: 1.02 mg/dL — AB (ref 0.44–1.00)
GFR calc Af Amer: >60 mL/min (ref >60)
GFR calc non Af Amer: 54 mL/min — ABNORMAL LOW (ref >60)

## 2018-03-11 LAB — GLUCOSE, CAPILLARY
GLUCOSE-CAPILLARY: 113 mg/dL — AB (ref 70–99)
GLUCOSE-CAPILLARY: 118 mg/dL — AB (ref 70–99)
GLUCOSE-CAPILLARY: 151 mg/dL — AB (ref 70–99)
Glucose-Capillary: 114 mg/dL — ABNORMAL HIGH (ref 70–99)

## 2018-03-11 LAB — MAGNESIUM
MAGNESIUM: 2.6 mg/dL — AB (ref 1.7–2.4)
Magnesium: 2.8 mg/dL — ABNORMAL HIGH (ref 1.7–2.4)

## 2018-03-11 MED ORDER — POTASSIUM CHLORIDE 10 MEQ/50ML IV SOLN
10.0000 meq | INTRAVENOUS | Status: AC | PRN
  Administered 2018-03-11 (×3): 10 meq via INTRAVENOUS
  Filled 2018-03-11: qty 50

## 2018-03-11 MED ORDER — VITAMIN B-12 1000 MCG PO TABS
1000.0000 ug | ORAL_TABLET | Freq: Every day | ORAL | Status: DC
  Administered 2018-03-11 – 2018-03-16 (×6): 1000 ug via ORAL
  Filled 2018-03-11 (×6): qty 1

## 2018-03-11 MED ORDER — WARFARIN SODIUM 2.5 MG PO TABS
2.5000 mg | ORAL_TABLET | Freq: Every day | ORAL | Status: DC
  Administered 2018-03-11 – 2018-03-14 (×4): 2.5 mg via ORAL
  Filled 2018-03-11 (×4): qty 1

## 2018-03-11 MED ORDER — WARFARIN - PHYSICIAN DOSING INPATIENT
Freq: Every day | Status: DC
  Administered 2018-03-11 – 2018-03-14 (×4)

## 2018-03-11 MED ORDER — ASPIRIN EC 81 MG PO TBEC
81.0000 mg | DELAYED_RELEASE_TABLET | Freq: Every day | ORAL | Status: DC
  Administered 2018-03-12 – 2018-03-16 (×5): 81 mg via ORAL
  Filled 2018-03-11 (×5): qty 1

## 2018-03-11 MED ORDER — ASPIRIN EC 325 MG PO TBEC
325.0000 mg | DELAYED_RELEASE_TABLET | Freq: Every day | ORAL | Status: AC
  Administered 2018-03-11: 325 mg via ORAL
  Filled 2018-03-11: qty 1

## 2018-03-11 MED ORDER — PROMETHAZINE HCL 25 MG/ML IJ SOLN
12.5000 mg | Freq: Four times a day (QID) | INTRAMUSCULAR | Status: DC | PRN
  Administered 2018-03-11 – 2018-03-13 (×2): 12.5 mg via INTRAVENOUS
  Filled 2018-03-11 (×2): qty 1

## 2018-03-11 MED ORDER — ALBUMIN HUMAN 5 % IV SOLN
250.0000 mL | INTRAVENOUS | Status: AC | PRN
  Administered 2018-03-11: 12.5 g via INTRAVENOUS
  Filled 2018-03-11: qty 250

## 2018-03-11 MED ORDER — DOPAMINE-DEXTROSE 3.2-5 MG/ML-% IV SOLN
3.0000 ug/kg/min | INTRAVENOUS | Status: DC
  Administered 2018-03-11: 3 ug/kg/min via INTRAVENOUS
  Filled 2018-03-11: qty 250

## 2018-03-11 MED ORDER — ENOXAPARIN SODIUM 40 MG/0.4ML ~~LOC~~ SOLN
40.0000 mg | Freq: Every day | SUBCUTANEOUS | Status: DC
  Administered 2018-03-12 – 2018-03-15 (×4): 40 mg via SUBCUTANEOUS
  Filled 2018-03-11 (×3): qty 0.4

## 2018-03-11 MED ORDER — INSULIN ASPART 100 UNIT/ML ~~LOC~~ SOLN
0.0000 [IU] | SUBCUTANEOUS | Status: DC
  Administered 2018-03-11 – 2018-03-12 (×2): 2 [IU] via SUBCUTANEOUS

## 2018-03-11 NOTE — Progress Notes (Signed)
Urine output 20-25cc/hr, bp soft Dr. Roxy Manns made aware. New order received and advised MD will come by pt room when out of the OR. Will continue to monitor.

## 2018-03-11 NOTE — Telephone Encounter (Signed)
Current admit 

## 2018-03-11 NOTE — Progress Notes (Addendum)
[facility] DAILY ICU PROGRESS NOTE                   [address]            [place] [address]          [phone]   1 Day Post-Op Procedure(s) (LRB): MINIMALLY INVASIVE MITRAL VALVE REPAIR (MVR):  -Triangular Resection of Posterior Leaflet P2; -Shortening Plasty of Posterior Leaflet P2; -Placement of Goretex Neo Chords x 6 using Chord-X system; -Ring Annuloplasty with a 30 mm Sorin Memo 4D Ring (Right) TRANSESOPHAGEAL ECHOCARDIOGRAM (TEE) (N/A)  Total Length of Stay:  LOS: 1 day   Subjective:  Patient doing okay.  She does have complaints of pain and nausea.  Both resolve with medication.  Objective: Vital signs in last 24 hours: Temp:  [95 F (35 C)-99 F (37.2 C)] 98.2 F (36.8 C) (10/18 0700) Pulse Rate:  [37-91] 89 (10/18 0700) Cardiac Rhythm: Ventricular paced (10/18 0400) Resp:  [9-27] 9 (10/18 0700) BP: (68-103)/(44-81) 78/58 (10/18 0700) SpO2:  [94 %-100 %] 94 % (10/18 0700) Arterial Line BP: (80-128)/(52-84) 86/57 (10/18 0700) FiO2 (%):  [40 %-50 %] 40 % (10/17 1735) Weight:  [77.3 kg] 77.3 kg (10/18 0603)  Filed Weights   03/10/18 0548 03/11/18 0603  Weight: 72.7 kg 77.3 kg    Weight change: 4.6 kg   Hemodynamic parameters for last 24 hours: PAP: (26-39)/(11-26) 38/26 CO:  [2 L/min-3.6 L/min] 2.9 L/min CI:  [1.1 L/min/m2-2.1 L/min/m2] 1.7 L/min/m2  Intake/Output from previous day: 10/17 0701 - 10/18 0700 In: 5360 [I.V.:3759.7; Blood:400; IV Piggyback:1200.3] Out: 0093 [Urine:3125; Blood:1000; Chest Tube:414]  Current Meds: Scheduled Meds: . acetaminophen  1,000 mg Oral Q6H  . aspirin EC  325 mg Oral Daily  . [START ON 03/12/2018] aspirin EC  81 mg Oral Daily  . bisacodyl  10 mg Oral Daily   Or  . bisacodyl  10 mg Rectal Daily  . docusate sodium  200 mg Oral Daily  . [START ON 03/12/2018] enoxaparin (LOVENOX) injection  40 mg Subcutaneous QHS  . insulin aspart  0-24 Units Subcutaneous Q4H  . levothyroxine  25 mcg Oral QAC breakfast    . [START ON 03/12/2018] pantoprazole  40 mg Oral Daily  . sodium chloride flush  3 mL Intravenous Q12H  . vitamin B-12  1,000 mcg Oral Daily  . warfarin  2.5 mg Oral q1800  . Warfarin - Physician Dosing Inpatient   Does not apply q1800   Continuous Infusions: . sodium chloride    . cefUROXime (ZINACEF)  IV 1.5 g (03/11/18 0602)  . lactated ringers 20 mL/hr at 03/10/18 2300  . lactated ringers Stopped (03/10/18 1604)  . nitroGLYCERIN    . potassium chloride 10 mEq (03/11/18 0653)   PRN Meds:.metoprolol tartrate, morphine injection, ondansetron (ZOFRAN) IV, oxyCODONE, potassium chloride, sodium chloride flush, traMADol  General appearance: alert, cooperative and no distress Heart: regular rate and rhythm Lungs: clear to auscultation bilaterally Abdomen: soft, non-tender; bowel sounds normal; no masses,  no organomegaly Extremities: edema trace Wound: clean and dry, aquacel in place on thoracotomy  Lab Results: CBC: Recent Labs    03/10/18 2136 03/11/18 0451  WBC 15.5* 14.4*  HGB 10.3* 10.0*  HCT 32.3* 31.2*  PLT 102* 99*   BMET:  Recent Labs    03/10/18 2132 03/10/18 2136 03/11/18 0451  NA 144  --  140  K 3.8  --  3.7  CL 110  --  112*  CO2  --   --  19*  GLUCOSE 147*  --  124*  BUN 12  --  12  CREATININE 0.80 0.91 0.94  CALCIUM  --   --  7.5*    CMET: Lab Results  Component Value Date   WBC 14.4 (H) 03/11/2018   HGB 10.0 (L) 03/11/2018   HCT 31.2 (L) 03/11/2018   PLT 99 (L) 03/11/2018   GLUCOSE 124 (H) 03/11/2018   CHOL 199 08/16/2017   TRIG 193 (H) 08/16/2017   HDL 45 (L) 08/16/2017   LDLCALC 123 (H) 08/16/2017   ALT 14 03/07/2018   AST 18 03/07/2018   NA 140 03/11/2018   K 3.7 03/11/2018   CL 112 (H) 03/11/2018   CREATININE 0.94 03/11/2018   BUN 12 03/11/2018   CO2 19 (L) 03/11/2018   TSH 2.32 08/16/2017   INR 1.37 03/10/2018   HGBA1C 5.5 03/07/2018      PT/INR:  Recent Labs    03/10/18 1522  LABPROT 16.8*  INR 1.37   Radiology: Dg  Chest Port 1 View  Result Date: 03/10/2018 CLINICAL DATA:  Post mitral valve repair. EXAM: PORTABLE CHEST 1 VIEW COMPARISON:  03/07/2018 FINDINGS: The patient is intubated. Endotracheal tube terminates 2.7 cm from the carina. Right-sided chest tube is in place. Left internal jugular approach Swan-Ganz catheter terminates in the expected location of the main pulmonary trunk. Enteric catheter terminates within the gastric body. Electrodes overlie the lower thorax. Cardiomediastinal silhouette is normal. Mediastinal contours appear intact. No evidence of pneumothorax. There is a small right pleural effusion. Right lower lobe atelectasis. Osseous structures are without acute abnormality. Soft tissues are grossly normal. IMPRESSION: Small right pleural effusion with right lower lobe atelectasis. Support apparatus as described. Electronically Signed   By: Fidela Salisbury M.D.   On: 03/10/2018 16:20   Assessment/Plan: S/P Procedure(s) (LRB): MINIMALLY INVASIVE MITRAL VALVE REPAIR (MVR):  -Triangular Resection of Posterior Leaflet P2; -Shortening Plasty of Posterior Leaflet P2; -Placement of Goretex Neo Chords x 6 using Chord-X system; -Ring Annuloplasty with a 30 mm Sorin Memo 4D Ring (Right) TRANSESOPHAGEAL ECHOCARDIOGRAM (TEE) (N/A)  1. CV- Sinus Bradycardia under pacer- continue for now, on Neosynephrine, BP dropped when patient dangled this morning, will wean as tolerated, start low dose coumadin for MV Repair 2. Pulm- no pneumothorax, small pleural effusions, CT output 500 ml since surgery, leave in place today 3. Renal- creatinine WNL, weight is elevated, will need Lasix once off Neo 4. Expected post operative blood loss anemia, mild at 10.0 5. Expected post operative thrombocytopenia, mild at 99, monitor 6. Dispo- patient stable, wean Neo as tolerated, start low dose coumadin, watch platelet count, POD #1 progression orders     Ellwood Handler 03/11/2018 7:45 AM   I have seen and examined the  patient and agree with the assessment and plan as outlined.  Doing well POD1.  Maintaining sinus brady - AAI paced w/ stable hemodynamics on low dose Neo for BP support.  Breathing comfortably.  Mild soreness in chest.  Mobilize.  D/C lines.  Diuresis once stable off Neo.  Start Coumadin   Rexene Alberts, MD 03/11/2018 8:28 AM

## 2018-03-11 NOTE — Discharge Summary (Signed)
Physician Discharge Summary  Patient ID: Maria Gomez MRN: [id number] DOB/AGE: Nov 13, 1945 72 y.o.  Admit date: 03/10/2018 Discharge date: 03/16/2018  Admission Diagnoses:  Patient Active Problem List   Diagnosis Date Noted  . Thyroid nodule 03/07/2018  . Non-rheumatic mitral regurgitation 01/18/2018  . Osteoporosis 09/28/2017  . Anxiety 08/23/2017  . Murmur 08/17/2016  . Carotid bruit 08/17/2016  . Constipation 01/06/2016  . Hypothyroidism   . Osteopenia    Discharge Diagnoses:   Patient Active Problem List   Diagnosis Date Noted  . S/P minimally invasive mitral valve repair, prinicpal 03/10/2018  . Thyroid nodule 03/07/2018  . Non-rheumatic mitral regurgitation 01/18/2018  . Osteoporosis 09/28/2017  . Anxiety 08/23/2017  . Murmur 08/17/2016  . Carotid bruit 08/17/2016  . Constipation 01/06/2016  . Hypothyroidism   . Osteopenia    Discharged Condition: good  History of Present Illness:  Maria Gomez is a 72 yo white female with history of Hypothyroidism and severe mitral regurgitation due to Mitral Valve Prolapse.  The patient hasbeen healthy essentially all of her adult life. She was recently noted to have a systolic murmur on physical exam and was referred for cardiology consultation. She was initially evaluated by Tamala Julian more recently by Dr. Martinique. Transthoracic echocardiogram performed September 20, 2017 revealed normal left ventricular systolic function with ejection fraction estimated 55 to 60%. There was mitral valve prolapse with at least moderately severe mitral regurgitation. Transesophageal echocardiogram performed November 09, 2017 confirmed the presence of bileaflet prolapse of the mitral valve with partially flail segment involving the middle scallop of the posterior leaflet. There was felt to be severe mitral regurgitation although quantification using PISAwas not possible because of the extremely eccentric jet of regurgitation. There was mild left  atrial enlargement and normal left ventricular size and systolic function. No other significant abnormalities were noted. Diagnostic cardiac catheterization was performed January 18, 2018. The patient was found to have normal coronary arteries with no significant coronary artery disease. There was severe mitral regurgitation with normal right heart pressures and normal left heart filling pressures. Cardiothoracic surgical consultation was requested.  She was evaluated by Dr. Roxy Manns who felt the patient would benefit from repair of her Mitral Valve.  He felt this could be done in a Minimally Invasive Approach.  The risks and benefits of the procedure were explained to the patient and she was agreeable proceed.  Hospital Course:   Maria Gomez presented to [facility] on 03/10/2018.  She was taken to the operating room and underwent Minimally Invasive Mitral Valve Repair.  She tolerated the procedure without difficulty and was taken to the SICU in stable condition.  She was extubated the evening of surgery.  During her stay in the SICU the patient was weaned off Neo-synephrine as tolerated.  She was in Sinus Bradycardia under her temporary pacemaker and this was continued until her HR improved.  She was started on low dose coumadin for her Mitral Valve repair.  She developed decreased urinary output and decreasing BP.  She was started on low dose Dopamine for this, which was weaned as able.  She was started on Lasix to help stimulate diuresis for volume overload status.  She had difficulty with nausea requiring zofran and phenergan for relief.  She developed hypokalemia and was supplemented accordingly.  Her heart rate improved and she was maintaining NSR.  Her pacing wires were removed on 03/14/2018.  Her chest tube output decreased and her chest tubes were removed on 03/14/2018.  She was  ambulating without much difficulty and was stable for transfer to the telemetry unit on 03/15/2018.  She continues  to do well.  She is maintaining NSR.  She remains on coumadin at 4 mg daily.  Her INR is 1.53 and her goal range will be 2.5-3.0.  She will get a PT/INR check on Friday.  She is ambulating independently.  Her incisions are healing without evidence of infection.  She is tolerating a diet.  She is medically stable for discharge home today.    Significant Diagnostic Studies: angiography:    The left ventricular systolic function is normal.  LV end diastolic pressure is normal.  The left ventricular ejection fraction is 55-65% by visual estimate.  There is severe (4+) mitral regurgitation and moderate mitral valve prolapse.   1. Normal coronary anatomy 2. Normal LV function 3. Mitral valve prolapse with severe MR 3-4+ 4. Normal right heart pressures 5. Normal LV filling pressures  No indication for antiplatelet therapy at this time.   Treatments: surgery:    Minimally-Invasive Mitral Valve Repair             Complex valvuloplasty including triangular resection of flail segment (P2) of posterior leaflet             Shortening plasty of posterior leaflet             Artificial Gore-tex neochord placement x6             Sorin Memo 4D Ring Annuloplasty (size 53mm, catalog # [id number], serial # [id number])  Discharge Exam: Blood pressure 110/71, pulse 81, temperature 98.7 F (37.1 C), temperature source Oral, resp. rate 20, height 5\' 1"  (1.549 m), weight 73.8 kg, SpO2 95 %.  General appearance: alert, cooperative and no distress Heart: regular rate and rhythm Lungs: clear to auscultation bilaterally Abdomen: soft, non-tender; bowel sounds normal; no masses,  no organomegaly Extremities: edema trace Wound: clean and dry  Disposition: Home  Discharge Medications:  Discharge Instructions    Amb Referral to Cardiac Rehabilitation   Complete by:  As directed    Diagnosis:  Valve Repair   Valve:  Mitral Comment - minimally invasive     Allergies as of 03/16/2018      Reactions    Niaspan [niacin Er] Other (See Comments)   Severe flushing   Phenazopyridine Hcl Other (See Comments)   Made sick   Statins    Severe flushing      Medication List    STOP taking these medications   docusate sodium 100 MG capsule Commonly known as:  COLACE   omeprazole 20 MG capsule Commonly known as:  PRILOSEC     TAKE these medications   alendronate 70 MG tablet Commonly known as:  FOSAMAX Take 1 tablet (70 mg total) by mouth every 7 (seven) days. Take with a full glass of water on an empty stomach.Sit upright for 30 minutes after taking   aspirin 81 MG EC tablet Take 1 tablet (81 mg total) by mouth daily.   Biotin 5000 MCG Tabs Take 5,000 mcg by mouth daily.   cetirizine 10 MG tablet Commonly known as:  ZYRTEC Take 1 tablet (10 mg total) by mouth daily. What changed:    when to take this  reasons to take this   CoQ10 200 MG Caps Take 200 mg by mouth daily.   furosemide 40 MG tablet Commonly known as:  LASIX Take 1 tablet (40 mg total) by mouth daily.   levothyroxine 25 MCG tablet  Commonly known as:  SYNTHROID, LEVOTHROID TAKE 1 TABLET EVERY DAY BEFORE BREAKFAST What changed:    how much to take  how to take this  when to take this   Melatonin 5 MG Caps Take 10 mg by mouth at bedtime.   metoprolol tartrate 25 MG tablet Commonly known as:  LOPRESSOR Take 0.5 tablets (12.5 mg total) by mouth 2 (two) times daily.   multivitamin tablet Take 1 tablet by mouth daily. Centrum Silver   potassium chloride SA 20 MEQ tablet Commonly known as:  K-DUR,KLOR-CON Take 1 tablet (20 mEq total) by mouth daily for 1 day.   PROBIOTIC DAILY PO Take 1 capsule by mouth daily. Florify   SYSTANE COMPLETE 0.6 % Soln Generic drug:  Propylene Glycol Place 1 drop into both eyes 2 (two) times daily as needed (dry eyes).   traMADol 50 MG tablet Commonly known as:  ULTRAM Take 1-2 tablets (50-100 mg total) by mouth every 4 (four) hours as needed for moderate pain.    vitamin B-12 1000 MCG tablet Commonly known as:  CYANOCOBALAMIN Take 1,000 mcg by mouth daily.   warfarin 4 MG tablet Commonly known as:  COUMADIN Take 1 tablet (4 mg total) by mouth daily at 6 PM.   zinc gluconate 50 MG tablet Take 50 mg by mouth daily.      Follow-up Information    Lendon Colonel, NP Follow up on 03/21/2018.   Specialties:  Nurse Practitioner, Radiology, Cardiology Why:  Please arrive 15 minutes early for your 9:30am post-hospital cardiology follow-up appointment Contact information: [address] [place] [address] [place] Follow up on 03/18/2018.   Specialty:  Cardiology Why:  Please arrive 15 minutes early for your 8:15am coumadin clinic appointment Contact information: [place] [phone]       [facility] [place] Follow up on 03/21/2018.   Specialty:  Cardiothoracic Surgery Why:  Appointment is at 10:00 for suture removal Contact information: [address] [place], [place] [phone]          Signed: Ellwood Handler 03/16/2018, 7:56 AM

## 2018-03-11 NOTE — Telephone Encounter (Signed)
TOC Patient- Please call Patient-  Pt has an appointment with Maria Gomez on 03-21-18.

## 2018-03-11 NOTE — Discharge Instructions (Addendum)
Discharge Instructions:  1. You may shower, please wash incisions daily with soap and water and keep dry.  If you wish to cover wounds with dressing you may do so but please keep clean and change daily.  No tub baths or swimming until incisions have completely healed.  If your incisions become red or develop any drainage please call our office at [phone]  2. No Driving until cleared by Dr. Guy Sandifer  office and you are no longer using narcotic pain medications  3. Monitor your weight daily.. Please use the same scale and weigh at same time... If you gain 5-10 lbs in 48 hours with associated lower extremity swelling, please contact our office at [phone]  4. Fever of 101.5 for at least 24 hours with no source, please contact our office at [phone]  5. Activity- up as tolerated, please walk at least 3 times per day.  Avoid strenuous activity for several weeks  6. If any questions or concerns arise, please do not hesitate to contact our office at [phone] --------------  Information on my medicine - Coumadin   (Warfarin)  Why was Coumadin prescribed for you? Coumadin was prescribed for you because you have a blood clot or a medical condition that can cause an increased risk of forming blood clots. Blood clots can cause serious health problems by blocking the flow of blood to the heart, lung, or brain. Coumadin can prevent harmful blood clots from forming. As a reminder your indication for Coumadin is:   Blood Clot Prevention After Heart Valve Surgery  What test will check on my response to Coumadin? While on Coumadin (warfarin) you will need to have an INR test regularly to ensure that your dose is keeping you in the desired range. The INR (international normalized ratio) number is calculated from the result of the laboratory test called prothrombin time (PT).  If an INR APPOINTMENT HAS NOT ALREADY BEEN MADE FOR YOU please schedule an appointment to have this lab work done by your  health care provider within 7 days. Your INR goal is usually a number between:  2 to 3 or your provider may give you a more narrow range like 2-2.5.  Ask your health care provider during an office visit what your goal INR is.  What  do you need to  know  About  COUMADIN? Take Coumadin (warfarin) exactly as prescribed by your healthcare provider about the same time each day.  DO NOT stop taking without talking to the doctor who prescribed the medication.  Stopping without other blood clot prevention medication to take the place of Coumadin may increase your risk of developing a new clot or stroke.  Get refills before you run out.  What do you do if you miss a dose? If you miss a dose, take it as soon as you remember on the same day then continue your regularly scheduled regimen the next day.  Do not take two doses of Coumadin at the same time.  Important Safety Information A possible side effect of Coumadin (Warfarin) is an increased risk of bleeding. You should call your healthcare provider right away if you experience any of the following: ? Bleeding from an injury or your nose that does not stop. ? Unusual colored urine (red or dark brown) or unusual colored stools (red or black). ? Unusual bruising for unknown reasons. ? A serious fall or if you hit your head (even if there is no bleeding).  Some foods or medicines interact with Coumadin (  warfarin) and might alter your response to warfarin. To help avoid this: ? Eat a balanced diet, maintaining a consistent amount of Vitamin K. ? Notify your provider about major diet changes you plan to make. ? Avoid alcohol or limit your intake to 1 drink for women and 2 drinks for men per day. (1 drink is 5 oz. wine, 12 oz. beer, or 1.5 oz. liquor.)  Make sure that ANY health care provider who prescribes medication for you knows that you are taking Coumadin (warfarin).  Also make sure the healthcare provider who is monitoring your Coumadin knows when you  have started a new medication including herbals and non-prescription products.  Coumadin (Warfarin)  Major Drug Interactions  Increased Warfarin Effect Decreased Warfarin Effect  Alcohol (large quantities) Antibiotics (esp. Septra/Bactrim, Flagyl, Cipro) Amiodarone (Cordarone) Aspirin (ASA) Cimetidine (Tagamet) Megestrol (Megace) NSAIDs (ibuprofen, naproxen, etc.) Piroxicam (Feldene) Propafenone (Rythmol SR) Propranolol (Inderal) Isoniazid (INH) Posaconazole (Noxafil) Barbiturates (Phenobarbital) Carbamazepine (Tegretol) Chlordiazepoxide (Librium) Cholestyramine (Questran) Griseofulvin Oral Contraceptives Rifampin Sucralfate (Carafate) Vitamin K   Coumadin (Warfarin) Major Herbal Interactions  Increased Warfarin Effect Decreased Warfarin Effect  Garlic Ginseng Ginkgo biloba Coenzyme Q10 Green tea St. Johns wort    Coumadin (Warfarin) FOOD Interactions  Eat a consistent number of servings per week of foods HIGH in Vitamin K (1 serving =  cup)  Collards (cooked, or boiled & drained) Kale (cooked, or boiled & drained) Mustard greens (cooked, or boiled & drained) Parsley *serving size only =  cup Spinach (cooked, or boiled & drained) Swiss chard (cooked, or boiled & drained) Turnip greens (cooked, or boiled & drained)  Eat a consistent number of servings per week of foods MEDIUM-HIGH in Vitamin K (1 serving = 1 cup)  Asparagus (cooked, or boiled & drained) Broccoli (cooked, boiled & drained, or raw & chopped) Brussel sprouts (cooked, or boiled & drained) *serving size only =  cup Lettuce, raw (green leaf, endive, romaine) Spinach, raw Turnip greens, raw & chopped   These websites have more information on Coumadin (warfarin):  FailFactory.se; VeganReport.com.au;

## 2018-03-11 NOTE — Progress Notes (Signed)
TCTS BRIEF SICU PROGRESS NOTE  1 Day Post-Op  S/P Procedure(s) (LRB): MINIMALLY INVASIVE MITRAL VALVE REPAIR (MVR):  -Triangular Resection of Posterior Leaflet P2; -Shortening Plasty of Posterior Leaflet P2; -Placement of Goretex Neo Chords x 6 using Chord-X system; -Ring Annuloplasty with a 30 mm Sorin Memo 4D Ring (Right) TRANSESOPHAGEAL ECHOCARDIOGRAM (TEE) (N/A)   Feels weak.  Mild soreness in chest.  No appetite.  Denies SOB AAI paced w/ BP 82-42 systolic off Neo drip Breathing comfortably w/ O2 sats 97-99% UOP 25-30 mL/hr Labs okay  Plan: Continue to hold diuretics for now.  Monitor BP closely.  Mobilize  Rexene Alberts, MD 03/11/2018 5:26 PM

## 2018-03-11 NOTE — Telephone Encounter (Signed)
LMTCB to discuss upcoming appointment.

## 2018-03-12 ENCOUNTER — Inpatient Hospital Stay (HOSPITAL_COMMUNITY): Payer: Medicare HMO

## 2018-03-12 LAB — BASIC METABOLIC PANEL
Anion gap: 9 (ref 5–15)
BUN: 16 mg/dL (ref 8–23)
CALCIUM: 6.5 mg/dL — AB (ref 8.9–10.3)
CO2: 21 mmol/L — ABNORMAL LOW (ref 22–32)
Chloride: 107 mmol/L (ref 98–111)
Creatinine, Ser: 0.91 mg/dL (ref 0.44–1.00)
Glucose, Bld: 134 mg/dL — ABNORMAL HIGH (ref 70–99)
Potassium: 4.7 mmol/L (ref 3.5–5.1)
SODIUM: 137 mmol/L (ref 135–145)

## 2018-03-12 LAB — PROTIME-INR
INR: 1.25
Prothrombin Time: 15.5 seconds — ABNORMAL HIGH (ref 11.4–15.2)

## 2018-03-12 LAB — CBC
HCT: 29.9 % — ABNORMAL LOW (ref 36.0–46.0)
Hemoglobin: 9.6 g/dL — ABNORMAL LOW (ref 12.0–15.0)
MCH: 30.8 pg (ref 26.0–34.0)
MCHC: 32.1 g/dL (ref 30.0–36.0)
MCV: 95.8 fL (ref 80.0–100.0)
PLATELETS: 97 10*3/uL — AB (ref 150–400)
RBC: 3.12 MIL/uL — ABNORMAL LOW (ref 3.87–5.11)
RDW: 13.4 % (ref 11.5–15.5)
WBC: 13.1 10*3/uL — AB (ref 4.0–10.5)
nRBC: 0 % (ref 0.0–0.2)

## 2018-03-12 LAB — GLUCOSE, CAPILLARY
Glucose-Capillary: 122 mg/dL — ABNORMAL HIGH (ref 70–99)
Glucose-Capillary: 114 mg/dL — ABNORMAL HIGH (ref 70–99)

## 2018-03-12 MED ORDER — MOVING RIGHT ALONG BOOK
Freq: Once | Status: AC
  Administered 2018-03-12: 12:00:00
  Filled 2018-03-12: qty 1

## 2018-03-12 MED ORDER — ALPRAZOLAM 0.25 MG PO TABS
0.2500 mg | ORAL_TABLET | Freq: Two times a day (BID) | ORAL | Status: DC | PRN
  Administered 2018-03-12 – 2018-03-14 (×4): 0.25 mg via ORAL
  Filled 2018-03-12 (×4): qty 1

## 2018-03-12 MED ORDER — FUROSEMIDE 10 MG/ML IJ SOLN
20.0000 mg | Freq: Four times a day (QID) | INTRAMUSCULAR | Status: AC
  Administered 2018-03-12 (×3): 20 mg via INTRAVENOUS
  Filled 2018-03-12 (×3): qty 2

## 2018-03-12 MED ORDER — CHLORHEXIDINE GLUCONATE CLOTH 2 % EX PADS
6.0000 | MEDICATED_PAD | Freq: Every day | CUTANEOUS | Status: DC
  Administered 2018-03-12 – 2018-03-14 (×2): 6 via TOPICAL

## 2018-03-12 NOTE — Progress Notes (Signed)
[address]       [place] [address]             [phone]        CARDIOTHORACIC SURGERY PROGRESS NOTE   R2 Days Post-Op Procedure(s) (LRB): MINIMALLY INVASIVE MITRAL VALVE REPAIR (MVR):  -Triangular Resection of Posterior Leaflet P2; -Shortening Plasty of Posterior Leaflet P2; -Placement of Goretex Neo Chords x 6 using Chord-X system; -Ring Annuloplasty with a 30 mm Sorin Memo 4D Ring (Right) TRANSESOPHAGEAL ECHOCARDIOGRAM (TEE) (N/A)  Subjective: Had a bad night w/ nausea and emesis, required IV phenergan.  Nausea currently improved.  Minimal pain in chest.  No abdominal pain.  No SOB.  Some anxiety  Objective: Vital signs: BP Readings from Last 1 Encounters:  03/12/18 105/65   Pulse Readings from Last 1 Encounters:  03/12/18 80   Resp Readings from Last 1 Encounters:  03/12/18 15   Temp Readings from Last 1 Encounters:  03/12/18 98 F (36.7 C) (Oral)    Hemodynamics: PAP: (36-41)/(18-21) 40/18  Physical Exam:  Rhythm:   Sinus - AAI paced  Breath sounds: clear  Heart sounds:  RRR w/out murmur  Incisions:  Dressings dry, intact  Abdomen:  Soft, non-distended, non-tender  Extremities:  Warm, well-perfused  Chest tubes:  decreasing volume thin serosanguinous output, no air leak    Intake/Output from previous day: 10/18 0701 - 10/19 0700 In: 1381.3 [P.O.:360; I.V.:374.9; IV Piggyback:646.4] Out: 1285 [Urine:675; Chest Tube:610] Intake/Output this shift: No intake/output data recorded.  Lab Results:  CBC: Recent Labs    03/11/18 1540 03/11/18 1541 03/12/18 0453  WBC 12.9*  --  13.1*  HGB 9.2* 9.2* 9.6*  HCT 29.2* 27.0* 29.9*  PLT 96*  --  97*    BMET:  Recent Labs    03/11/18 0451  03/11/18 1541 03/12/18 0453  NA 140  --  140 137  K 3.7  --  4.0 4.7  CL 112*  --  106 107  CO2 19*  --   --  21*  GLUCOSE 124*  --  110* 134*  BUN 12  --  15 16  CREATININE 0.94   < > 0.90 0.91  CALCIUM 7.5*  --   --  6.5*   < > =  values in this interval not displayed.     PT/INR:   Recent Labs    03/12/18 0453  LABPROT 15.5*  INR 1.25    CBG (last 3)  Recent Labs    03/11/18 2316 03/12/18 0314 03/12/18 0755  GLUCAP 113* 122* 114*    ABG    Component Value Date/Time   PHART 7.319 (L) 03/10/2018 1858   PCO2ART 37.6 03/10/2018 1858   PO2ART 82.0 (L) 03/10/2018 1858   HCO3 19.4 (L) 03/10/2018 1858   TCO2 19 (L) 03/11/2018 1541   ACIDBASEDEF 6.0 (H) 03/10/2018 1858   O2SAT 95.0 03/10/2018 1858    CXR: RLL atelectasis and mild pulm vasc congestion, overall looks good  Assessment/Plan: S/P Procedure(s) (LRB): MINIMALLY INVASIVE MITRAL VALVE REPAIR (MVR):  -Triangular Resection of Posterior Leaflet P2; -Shortening Plasty of Posterior Leaflet P2; -Placement of Goretex Neo Chords x 6 using Chord-X system; -Ring Annuloplasty with a 30 mm Sorin Memo 4D Ring (Right) TRANSESOPHAGEAL ECHOCARDIOGRAM (TEE) (N/A)  Overall stable POD2 Maintaining NSR/AAI paced rhythm w/ stable BP on low dose dopamine Breathing comfortably w/ O2 sats 100% on 2 L/min, CXR looks good Post op nausea and emesis Expected post op acute blood  loss anemia, mild, stable Expected post op atelectasis, R>L, mild Expected post op volume excess, weight reportedly 5.5 kg > preop, UOP adequate Post op thrombocytopenia, mild, stable Anxiety   Mobilize  Diuresis  Zofran as needed, Phenergan for severe nausea  Continue AAI pacing for now and hold beta blockers, ACE-I  Leave chest tubes until output decreases further   Rexene Alberts, MD 03/12/2018 8:33 AM

## 2018-03-12 NOTE — Progress Notes (Signed)
TCTS BRIEF SICU PROGRESS NOTE  2 Days Post-Op  S/P Procedure(s) (LRB): MINIMALLY INVASIVE MITRAL VALVE REPAIR (MVR):  -Triangular Resection of Posterior Leaflet P2; -Shortening Plasty of Posterior Leaflet P2; -Placement of Goretex Neo Chords x 6 using Chord-X system; -Ring Annuloplasty with a 30 mm Sorin Memo 4D Ring (Right) TRANSESOPHAGEAL ECHOCARDIOGRAM (TEE) (N/A)   Feels much better.  Had a good day NSR - AAI paced BP stable Breathing comfortably on 2 L/min UOP adequate  Plan: Continue current plan  Rexene Alberts, MD 03/12/2018 8:00 PM

## 2018-03-12 NOTE — Progress Notes (Signed)
Patient desat into the 70's when attempted to ambulate. O2 sats >95% when immobilized. Will continue to monitor.

## 2018-03-13 LAB — CBC
HEMATOCRIT: 30 % — AB (ref 36.0–46.0)
Hemoglobin: 9.5 g/dL — ABNORMAL LOW (ref 12.0–15.0)
MCH: 30 pg (ref 26.0–34.0)
MCHC: 31.7 g/dL (ref 30.0–36.0)
MCV: 94.6 fL (ref 80.0–100.0)
Platelets: 118 10*3/uL — ABNORMAL LOW (ref 150–400)
RBC: 3.17 MIL/uL — ABNORMAL LOW (ref 3.87–5.11)
RDW: 13.4 % (ref 11.5–15.5)
WBC: 12.6 10*3/uL — AB (ref 4.0–10.5)
nRBC: 0 % (ref 0.0–0.2)

## 2018-03-13 LAB — BASIC METABOLIC PANEL
Anion gap: 6 (ref 5–15)
BUN: 17 mg/dL (ref 8–23)
CALCIUM: 7.6 mg/dL — AB (ref 8.9–10.3)
CO2: 25 mmol/L (ref 22–32)
CREATININE: 0.83 mg/dL (ref 0.44–1.00)
Chloride: 105 mmol/L (ref 98–111)
GFR calc non Af Amer: >60 mL/min (ref >60)
Glucose, Bld: 110 mg/dL — ABNORMAL HIGH (ref 70–99)
Potassium: 3.2 mmol/L — ABNORMAL LOW (ref 3.5–5.1)
SODIUM: 136 mmol/L (ref 135–145)

## 2018-03-13 LAB — POTASSIUM: Potassium: 3.7 mmol/L (ref 3.5–5.1)

## 2018-03-13 LAB — PROTIME-INR
INR: 1.5
PROTHROMBIN TIME: 17.9 s — AB (ref 11.4–15.2)

## 2018-03-13 MED ORDER — WARFARIN VIDEO
Freq: Once | Status: AC
  Administered 2018-03-13: 17:00:00

## 2018-03-13 MED ORDER — FUROSEMIDE 10 MG/ML IJ SOLN
20.0000 mg | Freq: Four times a day (QID) | INTRAMUSCULAR | Status: AC
  Administered 2018-03-13 (×3): 20 mg via INTRAVENOUS
  Filled 2018-03-13 (×3): qty 2

## 2018-03-13 MED ORDER — POTASSIUM CHLORIDE 10 MEQ/50ML IV SOLN
10.0000 meq | INTRAVENOUS | Status: AC
  Administered 2018-03-13 (×3): 10 meq via INTRAVENOUS
  Filled 2018-03-13: qty 50

## 2018-03-13 MED ORDER — COUMADIN BOOK
Freq: Once | Status: AC
  Administered 2018-03-13: 17:00:00
  Filled 2018-03-13: qty 1

## 2018-03-13 MED ORDER — POTASSIUM CHLORIDE 10 MEQ/50ML IV SOLN
10.0000 meq | INTRAVENOUS | Status: AC
  Administered 2018-03-13 (×6): 10 meq via INTRAVENOUS
  Filled 2018-03-13 (×4): qty 50

## 2018-03-13 NOTE — Progress Notes (Signed)
Anesthesiology Follow-up:  72 year old female 3 days S/P minimally invasive mitral valve repair  Awake and alert, neuro intact, hemodynamically stable, maintaining SR, dopamine weaned off today. Coumadin started today  VS: T- VS: T- 35.7 BP- 120/78 HR- 70 RR- 20 O2 Sat 98-100 on 3L [place]  CXR: R. Lung expanded, chest tube in good position  K- 3.2 Na- 136 glucose- 111 BUN/Cr.- 17/0.83 H/H- 9.5/30.0 Platelets-- 118,000  Extubated 4 hours post-op.   Patients appears tired, but otherwise stable, no apparent anesthetic complications.  Roberts Gaudy

## 2018-03-13 NOTE — Progress Notes (Addendum)
      [address]       [place] [address]             [phone]        CARDIOTHORACIC SURGERY PROGRESS NOTE   R3 Days Post-Op Procedure(s) (LRB): MINIMALLY INVASIVE MITRAL VALVE REPAIR (MVR):  -Triangular Resection of Posterior Leaflet P2; -Shortening Plasty of Posterior Leaflet P2; -Placement of Goretex Neo Chords x 6 using Chord-X system; -Ring Annuloplasty with a 30 mm Sorin Memo 4D Ring (Right) TRANSESOPHAGEAL ECHOCARDIOGRAM (TEE) (N/A)  Subjective: Feels better.  Minimal pain.  Slept some  Objective: Vital signs: BP Readings from Last 1 Encounters:  03/13/18 134/77   Pulse Readings from Last 1 Encounters:  03/13/18 78   Resp Readings from Last 1 Encounters:  03/13/18 10   Temp Readings from Last 1 Encounters:  03/13/18 98.2 F (36.8 C) (Oral)    Hemodynamics:    Physical Exam:  Rhythm:   sinus  Breath sounds: clear  Heart sounds:  RRR w/out murmur  Incisions:  Dressings dry, intact  Abdomen:  Soft, non-distended, non-tender  Extremities:  Warm, well-perfused  Chest tubes:  decreasing volume thin serosanguinous output, no air leak    Intake/Output from previous day: 10/19 0701 - 10/20 0700 In: 683 [P.O.:360; I.V.:323] Out: 2285 [Urine:1985; Chest Tube:300] Intake/Output this shift: Total I/O In: -  Out: 525 [Urine:525]  Lab Results:  CBC: Recent Labs    03/12/18 0453 03/13/18 0327  WBC 13.1* 12.6*  HGB 9.6* 9.5*  HCT 29.9* 30.0*  PLT 97* 118*    BMET:  Recent Labs    03/12/18 0453 03/13/18 0327  NA 137 136  K 4.7 3.2*  CL 107 105  CO2 21* 25  GLUCOSE 134* 110*  BUN 16 17  CREATININE 0.91 0.83  CALCIUM 6.5* 7.6*     PT/INR:   Recent Labs    03/13/18 0327  LABPROT 17.9*  INR 1.50    CBG (last 3)  Recent Labs    03/11/18 2316 03/12/18 0314 03/12/18 0755  GLUCAP 113* 122* 114*    ABG    Component Value Date/Time   PHART 7.319 (L) 03/10/2018 1858   PCO2ART 37.6 03/10/2018 1858   PO2ART 82.0  (L) 03/10/2018 1858   HCO3 19.4 (L) 03/10/2018 1858   TCO2 19 (L) 03/11/2018 1541   ACIDBASEDEF 6.0 (H) 03/10/2018 1858   O2SAT 95.0 03/10/2018 1858    CXR: n/a  Assessment/Plan: S/P Procedure(s) (LRB): MINIMALLY INVASIVE MITRAL VALVE REPAIR (MVR):  -Triangular Resection of Posterior Leaflet P2; -Shortening Plasty of Posterior Leaflet P2; -Placement of Goretex Neo Chords x 6 using Chord-X system; -Ring Annuloplasty with a 30 mm Sorin Memo 4D Ring (Right) TRANSESOPHAGEAL ECHOCARDIOGRAM (TEE) (N/A)  Overall stable POD3 Maintaining NSR/AAI paced rhythm w/ stable BP  Breathing comfortably w/ O2 sats 100% on 2 L/min, CXR looks good Post op nausea and emesis Expected post op acute blood loss anemia, mild, stable Expected post op atelectasis, R>L, mild Expected post op volume excess, weight down 1 kg but still reportedly 4.5 kg > preop, UOP adequate Post op thrombocytopenia, mild, improved Hypokalemia, induced by loop diuretics   Mobilize  Diuresis  Supplement potassium  Leave chest tubes until output decreases further   Rexene Alberts, MD 03/13/2018 9:13 AM

## 2018-03-14 LAB — CBC
HEMATOCRIT: 29.7 % — AB (ref 36.0–46.0)
Hemoglobin: 9.3 g/dL — ABNORMAL LOW (ref 12.0–15.0)
MCH: 29.8 pg (ref 26.0–34.0)
MCHC: 31.3 g/dL (ref 30.0–36.0)
MCV: 95.2 fL (ref 80.0–100.0)
PLATELETS: 155 10*3/uL (ref 150–400)
RBC: 3.12 MIL/uL — ABNORMAL LOW (ref 3.87–5.11)
RDW: 13.8 % (ref 11.5–15.5)
WBC: 9.7 10*3/uL (ref 4.0–10.5)
nRBC: 0 % (ref 0.0–0.2)

## 2018-03-14 LAB — PROTIME-INR
INR: 1.73
Prothrombin Time: 20 seconds — ABNORMAL HIGH (ref 11.4–15.2)

## 2018-03-14 LAB — BASIC METABOLIC PANEL
ANION GAP: 7 (ref 5–15)
BUN: 17 mg/dL (ref 8–23)
CALCIUM: 7.6 mg/dL — AB (ref 8.9–10.3)
CO2: 25 mmol/L (ref 22–32)
Chloride: 103 mmol/L (ref 98–111)
Creatinine, Ser: 0.95 mg/dL (ref 0.44–1.00)
GFR calc non Af Amer: 58 mL/min — ABNORMAL LOW (ref >60)
Glucose, Bld: 89 mg/dL (ref 70–99)
Potassium: 3.8 mmol/L (ref 3.5–5.1)
Sodium: 135 mmol/L (ref 135–145)

## 2018-03-14 LAB — GLUCOSE, CAPILLARY: Glucose-Capillary: 160 mg/dL — ABNORMAL HIGH (ref 70–99)

## 2018-03-14 MED ORDER — POTASSIUM CHLORIDE CRYS ER 20 MEQ PO TBCR
40.0000 meq | EXTENDED_RELEASE_TABLET | Freq: Every day | ORAL | Status: DC
  Administered 2018-03-15 – 2018-03-16 (×2): 40 meq via ORAL
  Filled 2018-03-14 (×2): qty 2

## 2018-03-14 MED ORDER — FUROSEMIDE 10 MG/ML IJ SOLN
20.0000 mg | Freq: Four times a day (QID) | INTRAMUSCULAR | Status: DC
  Administered 2018-03-14 (×2): 20 mg via INTRAVENOUS
  Filled 2018-03-14 (×2): qty 2

## 2018-03-14 MED ORDER — POTASSIUM CHLORIDE 10 MEQ/50ML IV SOLN
10.0000 meq | INTRAVENOUS | Status: AC
  Administered 2018-03-14 (×3): 10 meq via INTRAVENOUS
  Filled 2018-03-14 (×3): qty 50

## 2018-03-14 MED ORDER — METOPROLOL TARTRATE 12.5 MG HALF TABLET
12.5000 mg | ORAL_TABLET | Freq: Two times a day (BID) | ORAL | Status: DC
  Administered 2018-03-14 – 2018-03-16 (×5): 12.5 mg via ORAL
  Filled 2018-03-14 (×5): qty 1

## 2018-03-14 MED ORDER — POTASSIUM CHLORIDE CRYS ER 20 MEQ PO TBCR
40.0000 meq | EXTENDED_RELEASE_TABLET | Freq: Two times a day (BID) | ORAL | Status: DC
  Administered 2018-03-14: 40 meq via ORAL
  Filled 2018-03-14: qty 2

## 2018-03-14 MED ORDER — FUROSEMIDE 40 MG PO TABS
40.0000 mg | ORAL_TABLET | Freq: Every day | ORAL | Status: DC
  Administered 2018-03-15 – 2018-03-16 (×2): 40 mg via ORAL
  Filled 2018-03-14 (×2): qty 1

## 2018-03-14 NOTE — Progress Notes (Signed)
      [address]       [place] [address]             [phone]      POD # 4 mitral repair  BP 111/68   Pulse 78   Temp 98.4 F (36.9 C) (Oral)   Resp 18   Ht 5\' 1"  (1.549 m)   Wt 77.4 kg   SpO2 96%   BMI 32.23 kg/m   Intake/Output Summary (Last 24 hours) at 03/14/2018 2126 Last data filed at 03/14/2018 1747 Gross per 24 hour  Intake 503.82 ml  Output 3150 ml  Net -2646.18 ml   Continue current care  Blaire Palomino C. Roxan Hockey, MD [facility] [phone]

## 2018-03-14 NOTE — Progress Notes (Signed)
      [address]       [place] [address]             [phone]        CARDIOTHORACIC SURGERY PROGRESS NOTE   R4 Days Post-Op Procedure(s) (LRB): MINIMALLY INVASIVE MITRAL VALVE REPAIR (MVR):  -Triangular Resection of Posterior Leaflet P2; -Shortening Plasty of Posterior Leaflet P2; -Placement of Goretex Neo Chords x 6 using Chord-X system; -Ring Annuloplasty with a 30 mm Sorin Memo 4D Ring (Right) TRANSESOPHAGEAL ECHOCARDIOGRAM (TEE) (N/A)  Subjective: Feels much better.  No complaints.  Mild discomfort right shoulder.  No SOB.  Appetite improved.  Objective: Vital signs: BP Readings from Last 1 Encounters:  03/14/18 129/79   Pulse Readings from Last 1 Encounters:  03/14/18 77   Resp Readings from Last 1 Encounters:  03/14/18 18   Temp Readings from Last 1 Encounters:  03/14/18 (!) 97.5 F (36.4 C) (Oral)    Hemodynamics:    Physical Exam:  Rhythm:   sinus  Breath sounds: clear  Heart sounds:  RRR w/out murmur  Incisions:  Dressings dry, intact  Abdomen:  Soft, non-distended, non-tender\  Extremities:  Warm, well-perfused  Chest tubes:  low volume thin serosanguinous output, no air leak    Intake/Output from previous day: 10/20 0701 - 10/21 0700 In: 494.6 [I.V.:22.1; IV Piggyback:472.5] Out: 9485 [Urine:1425; Chest Tube:230] Intake/Output this shift: Total I/O In: 240 [P.O.:240] Out: 200 [Urine:200]  Lab Results:  CBC: Recent Labs    03/13/18 0327 03/14/18 0251  WBC 12.6* 9.7  HGB 9.5* 9.3*  HCT 30.0* 29.7*  PLT 118* 155    BMET:  Recent Labs    03/13/18 0327 03/13/18 1752 03/14/18 0251  NA 136  --  135  K 3.2* 3.7 3.8  CL 105  --  103  CO2 25  --  25  GLUCOSE 110*  --  89  BUN 17  --  17  CREATININE 0.83  --  0.95  CALCIUM 7.6*  --  7.6*     PT/INR:   Recent Labs    03/14/18 0251  LABPROT 20.0*  INR 1.73    CBG (last 3)  Recent Labs    03/11/18 2316 03/12/18 0314 03/12/18 0755  GLUCAP 113* 122*  114*    ABG    Component Value Date/Time   PHART 7.319 (L) 03/10/2018 1858   PCO2ART 37.6 03/10/2018 1858   PO2ART 82.0 (L) 03/10/2018 1858   HCO3 19.4 (L) 03/10/2018 1858   TCO2 19 (L) 03/11/2018 1541   ACIDBASEDEF 6.0 (H) 03/10/2018 1858   O2SAT 95.0 03/10/2018 1858    CXR: n/a  Assessment/Plan: S/P Procedure(s) (LRB): MINIMALLY INVASIVE MITRAL VALVE REPAIR (MVR):  -Triangular Resection of Posterior Leaflet P2; -Shortening Plasty of Posterior Leaflet P2; -Placement of Goretex Neo Chords x 6 using Chord-X system; -Ring Annuloplasty with a 30 mm Sorin Memo 4D Ring (Right) TRANSESOPHAGEAL ECHOCARDIOGRAM (TEE) (N/A)  Doing well POD4 Maintaining NSR w/ stable BP  Breathing comfortably w/ O2 sats 96-100% on room air Expected post op acute blood loss anemia,mild, stable Expected post op atelectasis,R>L, mild Expected post op volume excess, weight down 1 kg but still reportedly3.5 kg>preop, UOP adequate Post op thrombocytopenia, resolved Hypokalemia, induced by loop diuretics   Mobilize  Diuresis  Supplement potassium  D/C chest tubes and pacing wires  Advance diet  Continue Coumadin 2.5 mg/day  Transfer 4E  Rexene Alberts, MD 03/14/2018 8:31 AM

## 2018-03-14 NOTE — Telephone Encounter (Signed)
Current admit 

## 2018-03-14 NOTE — Progress Notes (Signed)
Pacer wires and On-Q catheter removed per order by Cherylann Ratel RN.

## 2018-03-15 ENCOUNTER — Inpatient Hospital Stay (HOSPITAL_COMMUNITY): Payer: Medicare HMO

## 2018-03-15 LAB — BASIC METABOLIC PANEL
ANION GAP: 8 (ref 5–15)
BUN: 13 mg/dL (ref 8–23)
CALCIUM: 8.1 mg/dL — AB (ref 8.9–10.3)
CO2: 24 mmol/L (ref 22–32)
Chloride: 105 mmol/L (ref 98–111)
Creatinine, Ser: 0.88 mg/dL (ref 0.44–1.00)
GFR calc Af Amer: >60 mL/min (ref >60)
GLUCOSE: 88 mg/dL (ref 70–99)
POTASSIUM: 3.6 mmol/L (ref 3.5–5.1)
Sodium: 137 mmol/L (ref 135–145)

## 2018-03-15 LAB — PROTIME-INR
INR: 1.69
PROTHROMBIN TIME: 19.7 s — AB (ref 11.4–15.2)

## 2018-03-15 MED ORDER — WARFARIN SODIUM 4 MG PO TABS
4.0000 mg | ORAL_TABLET | Freq: Every day | ORAL | Status: DC
  Administered 2018-03-15: 4 mg via ORAL
  Filled 2018-03-15: qty 1

## 2018-03-15 MED ORDER — POTASSIUM CHLORIDE CRYS ER 20 MEQ PO TBCR
40.0000 meq | EXTENDED_RELEASE_TABLET | Freq: Once | ORAL | Status: AC
  Administered 2018-03-15: 40 meq via ORAL
  Filled 2018-03-15: qty 2

## 2018-03-15 MED ORDER — POTASSIUM CHLORIDE CRYS ER 20 MEQ PO TBCR
20.0000 meq | EXTENDED_RELEASE_TABLET | ORAL | Status: DC | PRN
  Administered 2018-03-15: 20 meq via ORAL
  Filled 2018-03-15: qty 1

## 2018-03-15 MED ORDER — POTASSIUM CHLORIDE CRYS ER 20 MEQ PO TBCR
20.0000 meq | EXTENDED_RELEASE_TABLET | ORAL | Status: AC
  Administered 2018-03-15 (×2): 20 meq via ORAL
  Filled 2018-03-15 (×2): qty 1

## 2018-03-15 NOTE — Progress Notes (Addendum)
TCTS DAILY ICU PROGRESS NOTE                   [address]            [place] [address]          [phone]   5 Days Post-Op Procedure(s) (LRB): MINIMALLY INVASIVE MITRAL VALVE REPAIR (MVR):  -Triangular Resection of Posterior Leaflet P2; -Shortening Plasty of Posterior Leaflet P2; -Placement of Goretex Neo Chords x 6 using Chord-X system; -Ring Annuloplasty with a 30 mm Sorin Memo 4D Ring (Right) TRANSESOPHAGEAL ECHOCARDIOGRAM (TEE) (N/A)  Total Length of Stay:  LOS: 5 days   Subjective:   Patient states she is worn out today.  She states she was in the bathroom nonstop yesterday due to diuretics and stool softeners.  But overall she says she is doing well and couldn't be happier with this experience.  Objective: Vital signs in last 24 hours: Temp:  [97.7 F (36.5 C)-98.4 F (36.9 C)] 97.7 F (36.5 C) (10/22 0810) Pulse Rate:  [65-78] 76 (10/22 0810) Cardiac Rhythm: Normal sinus rhythm (10/22 0810) Resp:  [11-27] 12 (10/22 0810) BP: (78-146)/(54-125) 109/60 (10/22 0810) SpO2:  [93 %-100 %] 96 % (10/22 0810) Weight:  [74.6 kg] 74.6 kg (10/22 0522)  Filed Weights   03/13/18 0410 03/14/18 0500 03/15/18 0522  Weight: 78.3 kg 77.4 kg 74.6 kg    Weight change: -2.812 kg   Intake/Output from previous day: 10/21 0701 - 10/22 0700 In: 393 [P.O.:240; I.V.:3; IV Piggyback:150] Out: 2850 [Urine:2750; Chest Tube:100]  Intake/Output this shift: Total I/O In: 240 [P.O.:240] Out: -   Current Meds: Scheduled Meds: . acetaminophen  1,000 mg Oral Q6H  . aspirin EC  81 mg Oral Daily  . bisacodyl  10 mg Oral Daily   Or  . bisacodyl  10 mg Rectal Daily  . Chlorhexidine Gluconate Cloth  6 each Topical Q0600  . docusate sodium  200 mg Oral Daily  . enoxaparin (LOVENOX) injection  40 mg Subcutaneous QHS  . furosemide  40 mg Oral Daily  . levothyroxine  25 mcg Oral QAC breakfast  . metoprolol tartrate  12.5 mg Oral BID  . pantoprazole  40 mg Oral Daily  .  potassium chloride  20 mEq Oral Q4H  . potassium chloride  40 mEq Oral Q breakfast  . potassium chloride  40 mEq Oral Once  . sodium chloride flush  3 mL Intravenous Q12H  . vitamin B-12  1,000 mcg Oral Daily  . warfarin  4 mg Oral q1800  . Warfarin - Physician Dosing Inpatient   Does not apply q1800   Continuous Infusions: . sodium chloride Stopped (03/11/18 1817)  . lactated ringers 20 mL/hr at 03/11/18 1427   PRN Meds:.ALPRAZolam, metoprolol tartrate, morphine injection, ondansetron (ZOFRAN) IV, oxyCODONE, promethazine, sodium chloride flush, traMADol  General appearance: alert, cooperative and no distress Heart: regular rate and rhythm Lungs: clear to auscultation bilaterally Abdomen: soft, non-tender; bowel sounds normal; no masses,  no organomegaly Extremities: edema trace Wound: clean and dry  Lab Results: CBC: Recent Labs    03/13/18 0327 03/14/18 0251  WBC 12.6* 9.7  HGB 9.5* 9.3*  HCT 30.0* 29.7*  PLT 118* 155   BMET:  Recent Labs    03/14/18 0251 03/15/18 0257  NA 135 137  K 3.8 3.6  CL 103 105  CO2 25 24  GLUCOSE 89 88  BUN 17 13  CREATININE 0.95 0.88  CALCIUM 7.6* 8.1*  CMET: Lab Results  Component Value Date   WBC 9.7 03/14/2018   HGB 9.3 (L) 03/14/2018   HCT 29.7 (L) 03/14/2018   PLT 155 03/14/2018   GLUCOSE 88 03/15/2018   CHOL 199 08/16/2017   TRIG 193 (H) 08/16/2017   HDL 45 (L) 08/16/2017   LDLCALC 123 (H) 08/16/2017   ALT 14 03/07/2018   AST 18 03/07/2018   NA 137 03/15/2018   K 3.6 03/15/2018   CL 105 03/15/2018   CREATININE 0.88 03/15/2018   BUN 13 03/15/2018   CO2 24 03/15/2018   TSH 2.32 08/16/2017   INR 1.69 03/15/2018   HGBA1C 5.5 03/07/2018      PT/INR:  Recent Labs    03/15/18 0257  LABPROT 19.7*  INR 1.69   Radiology: Dg Chest 2 View  Result Date: 03/15/2018 CLINICAL DATA:  Shortness of breath. EXAM: CHEST - 2 VIEW COMPARISON:  Radiograph of March 12, 2018. FINDINGS: Stable cardiomediastinal  silhouette. Right-sided chest tubes have been removed. Small right apical pneumothorax is noted. Subcutaneous emphysema is seen overlying right lateral chest wall. Mild right basilar atelectasis or effusion is noted. Left lung is unremarkable. Status post mitral valve repair. Bony thorax is unremarkable. IMPRESSION: Small right apical pneumothorax is noted status post right-sided chest tubes removal. Subcutaneous emphysema is seen over right lateral chest wall. Mild right basilar subsegmental atelectasis or effusion is noted. Electronically Signed   By: Marijo Conception, M.D.   On: 03/15/2018 07:17     Assessment/Plan: S/P Procedure(s) (LRB): MINIMALLY INVASIVE MITRAL VALVE REPAIR (MVR):  -Triangular Resection of Posterior Leaflet P2; -Shortening Plasty of Posterior Leaflet P2; -Placement of Goretex Neo Chords x 6 using Chord-X system; -Ring Annuloplasty with a 30 mm Sorin Memo 4D Ring (Right) TRANSESOPHAGEAL ECHOCARDIOGRAM (TEE) (N/A)  1. CV-NSR, Hypotensive, mainly overnight- continue Lopressor at 12.5 mg BID 2. INR- dropped to 1.69, will increase Coumadin to 4 mg daily 3. Pulm- off oxygen, no acute issues, CXR shows some mild sub q air on right side, small apical pneumothorax on right 4. Renal- creatinine WNL, weight is trending down, continue Lasix 5. Hypokalemia- patient at 3.6, due to diuretics, supplementation ordered 6. Expected thrombocytopenia, improving up to 155 7. Dispo- patient stable, maintaining NSR, continue diuretics weight is trending down, increase coumadin dose, supplement K, awaiting bed on 4 E     Maria Gomez 03/15/2018 9:14 AM   I have seen and examined the patient and agree with the assessment and plan as outlined.  Doing well.  Anticipate possible d/c home 1-2 days.  Rexene Alberts, MD 03/15/2018 10:25 AM

## 2018-03-15 NOTE — Progress Notes (Signed)
1338-1400 Pt just finished 470 ft walk with family and walker. Has walker at home if needed. Discussed CRP 2 and referred to [facility]. Discussed IS, walking for ex and restrictions. Will follow up tomorrow and complete ed . Graylon Good RN BSN 03/15/2018 2:00 PM

## 2018-03-15 NOTE — Progress Notes (Signed)
Pt transferred to 4E-14 from [facility] via wheelchair with staff and family. Pt placed in room chair. Pt given CHG bath. TELE applied, CCMD notified. Pt oriented to room and call bell. Call bell within reach.  Amanda Cockayne, RN

## 2018-03-15 NOTE — Telephone Encounter (Signed)
Patient currently admitted

## 2018-03-16 LAB — PROTIME-INR
INR: 1.53
Prothrombin Time: 18.2 seconds — ABNORMAL HIGH (ref 11.4–15.2)

## 2018-03-16 LAB — BASIC METABOLIC PANEL
ANION GAP: 5 (ref 5–15)
BUN: 10 mg/dL (ref 8–23)
CO2: 25 mmol/L (ref 22–32)
Calcium: 8.5 mg/dL — ABNORMAL LOW (ref 8.9–10.3)
Chloride: 108 mmol/L (ref 98–111)
Creatinine, Ser: 0.89 mg/dL (ref 0.44–1.00)
GFR calc non Af Amer: >60 mL/min (ref >60)
GLUCOSE: 112 mg/dL — AB (ref 70–99)
POTASSIUM: 4.7 mmol/L (ref 3.5–5.1)
Sodium: 138 mmol/L (ref 135–145)

## 2018-03-16 MED ORDER — WARFARIN SODIUM 4 MG PO TABS: 4.0000 mg | ORAL_TABLET | Freq: Every day | ORAL | 3 refills | Status: DC

## 2018-03-16 MED ORDER — METOPROLOL TARTRATE 25 MG PO TABS: 12.5000 mg | ORAL_TABLET | Freq: Two times a day (BID) | ORAL | 3 refills | Status: DC

## 2018-03-16 MED ORDER — ASPIRIN 81 MG PO TBEC: 81.0000 mg | DELAYED_RELEASE_TABLET | Freq: Every day | ORAL | Status: DC

## 2018-03-16 MED ORDER — POTASSIUM CHLORIDE CRYS ER 20 MEQ PO TBCR: 20.0000 meq | EXTENDED_RELEASE_TABLET | Freq: Every day | ORAL | 0 refills | Status: DC

## 2018-03-16 MED ORDER — FUROSEMIDE 40 MG PO TABS: 40.0000 mg | ORAL_TABLET | Freq: Every day | ORAL | 0 refills | Status: DC

## 2018-03-16 MED ORDER — TRAMADOL HCL 50 MG PO TABS: 50.0000 mg | ORAL_TABLET | ORAL | 0 refills | Status: DC | PRN

## 2018-03-16 MED FILL — Heparin Sodium (Porcine) Inj 1000 Unit/ML: INTRAMUSCULAR | Qty: 2500 | Status: AC

## 2018-03-16 MED FILL — Heparin Sodium (Porcine) Inj 1000 Unit/ML: INTRAMUSCULAR | Qty: 30 | Status: AC

## 2018-03-16 MED FILL — Lidocaine HCl(Cardiac) IV PF Soln Pref Syr 100 MG/5ML (2%): INTRAVENOUS | Qty: 25 | Status: AC

## 2018-03-16 MED FILL — Heparin Sodium (Porcine) Inj 1000 Unit/ML: INTRAMUSCULAR | Qty: 10 | Status: AC

## 2018-03-16 MED FILL — Sodium Bicarbonate IV Soln 8.4%: INTRAVENOUS | Qty: 50 | Status: AC

## 2018-03-16 MED FILL — Sodium Chloride IV Soln 0.9%: INTRAVENOUS | Qty: 2000 | Status: AC

## 2018-03-16 MED FILL — Calcium Chloride Inj 10%: INTRAVENOUS | Qty: 10 | Status: AC

## 2018-03-16 MED FILL — Potassium Chloride Inj 2 mEq/ML: INTRAVENOUS | Qty: 40 | Status: AC

## 2018-03-16 MED FILL — Mannitol IV Soln 20%: INTRAVENOUS | Qty: 1000 | Status: AC

## 2018-03-16 MED FILL — Magnesium Sulfate Inj 50%: INTRAMUSCULAR | Qty: 10 | Status: AC

## 2018-03-16 MED FILL — Electrolyte-R (PH 7.4) Solution: INTRAVENOUS | Qty: 4000 | Status: AC

## 2018-03-16 NOTE — Progress Notes (Signed)
CARDIAC REHAB PHASE I   PRE:  Rate/Rhythm: 80 SR  BP:  Supine:   Sitting: 118/60  Standing:    SaO2: 98%RA  MODE:  Ambulation: 1410 ft   POST:  Rate/Rhythm: 87 SR  BP:  Supine:   Sitting: 111/67  Standing:    SaO2: 95%RA 0928-1015 Pt walked 1410 ft on RA with rolling walker independently. Tolerated increase in distance well. To recliner after walk. Gave heart healthy diet and ex ed. Pt voiced understanding. Needs to see pharmacist for Coumadin ed.  Referred to  CRP 2.   Graylon Good, RN BSN  03/16/2018 10:11 AM

## 2018-03-16 NOTE — Care Management Important Message (Signed)
Important Message  Patient Details  Name: Maria Gomez MRN: [id number] Date of Birth: August 14, 1945   Medicare Important Message Given:  Yes    Barb Merino Davina Howlett 03/16/2018, 12:48 PM

## 2018-03-16 NOTE — Progress Notes (Addendum)
      [address]       [place] [address]             [phone]      6 Days Post-Op Procedure(s) (LRB): MINIMALLY INVASIVE MITRAL VALVE REPAIR (MVR):  -Triangular Resection of Posterior Leaflet P2; -Shortening Plasty of Posterior Leaflet P2; -Placement of Goretex Neo Chords x 6 using Chord-X system; -Ring Annuloplasty with a 30 mm Sorin Memo 4D Ring (Right) TRANSESOPHAGEAL ECHOCARDIOGRAM (TEE) (N/A)   Subjective:  No new complaints.  Didn't sleep well, due to bed being hard.  She thinks she will do better at home.  She says there isn't anything being done here that she cant do at home.  Objective: Vital signs in last 24 hours: Temp:  [97.7 F (36.5 C)-99.3 F (37.4 C)] 98.7 F (37.1 C) (10/23 0431) Pulse Rate:  [70-100] 81 (10/23 0431) Cardiac Rhythm: Normal sinus rhythm (10/23 0700) Resp:  [12-25] 20 (10/23 0431) BP: (103-117)/(60-81) 110/71 (10/23 0431) SpO2:  [95 %-98 %] 95 % (10/23 0431) Weight:  [73.8 kg] 73.8 kg (10/23 0426)  Intake/Output from previous day: 10/22 0701 - 10/23 0700 In: 486 [P.O.:480; I.V.:6] Out: -   General appearance: alert, cooperative and no distress Heart: regular rate and rhythm Lungs: clear to auscultation bilaterally Abdomen: soft, non-tender; bowel sounds normal; no masses,  no organomegaly Extremities: edema trace Wound: clean and dry  Lab Results: Recent Labs    03/14/18 0251  WBC 9.7  HGB 9.3*  HCT 29.7*  PLT 155   BMET:  Recent Labs    03/15/18 0257 03/16/18 0259  NA 137 138  K 3.6 4.7  CL 105 108  CO2 24 25  GLUCOSE 88 112*  BUN 13 10  CREATININE 0.88 0.89  CALCIUM 8.1* 8.5*    PT/INR:  Recent Labs    03/16/18 0259  LABPROT 18.2*  INR 1.53   ABG    Component Value Date/Time   PHART 7.319 (L) 03/10/2018 1858   HCO3 19.4 (L) 03/10/2018 1858   TCO2 19 (L) 03/11/2018 1541   ACIDBASEDEF 6.0 (H) 03/10/2018 1858   O2SAT 95.0 03/10/2018 1858   CBG (last 3)  No results for input(s):  GLUCAP in the last 72 hours.  Assessment/Plan: S/P Procedure(s) (LRB): MINIMALLY INVASIVE MITRAL VALVE REPAIR (MVR):  -Triangular Resection of Posterior Leaflet P2; -Shortening Plasty of Posterior Leaflet P2; -Placement of Goretex Neo Chords x 6 using Chord-X system; -Ring Annuloplasty with a 30 mm Sorin Memo 4D Ring (Right) TRANSESOPHAGEAL ECHOCARDIOGRAM (TEE) (N/A)  1. CV- NSR, hypotension at times- continue Lopressor at 12.5 mg BID 2. INR 1.53, iwill continue coumadin at 4 mg daily, plan for INR check on Friday 3. Pulm- no acute issues, continue IS 4. Renal- creatinine has been stable, weight is trending down, continue Lasix 5. Hypokalemia- resolved, K is at 4.7 6. Dispo- patient stable, will d/c home today   LOS: 6 days    Erin Barrett 03/16/2018   I have seen and examined the patient and agree with the assessment and plan as outlined.  D/C home today  Rexene Alberts, MD 03/16/2018 9:20 AM

## 2018-03-17 LAB — BPAM RBC
Blood Product Expiration Date: 201911182359
Blood Product Expiration Date: 201911182359
ISSUE DATE / TIME: 201910171246
ISSUE DATE / TIME: 201910171246
UNIT TYPE AND RH: 5100
Unit Type and Rh: 5100

## 2018-03-17 LAB — TYPE AND SCREEN
ABO/RH(D): O POS
Antibody Screen: NEGATIVE
UNIT DIVISION: 0
Unit division: 0

## 2018-03-18 ENCOUNTER — Ambulatory Visit (INDEPENDENT_AMBULATORY_CARE_PROVIDER_SITE_OTHER): Payer: Medicare HMO | Admitting: Pharmacist

## 2018-03-18 DIAGNOSIS — Z9889 Other specified postprocedural states: Secondary | ICD-10-CM | POA: Diagnosis not present

## 2018-03-18 DIAGNOSIS — Z7901 Long term (current) use of anticoagulants: Secondary | ICD-10-CM | POA: Diagnosis not present

## 2018-03-18 LAB — POCT INR: INR: 1.6 — AB (ref 2.0–3.0)

## 2018-03-18 NOTE — Progress Notes (Deleted)
2

## 2018-03-18 NOTE — Telephone Encounter (Signed)
Patient contacted regarding discharge from10/17/2019 - 03/16/2018 (6 days) [facility]  Patient understands to follow up with provider lawrence on 03-21-2018 Patient understands discharge instructions? YES Patient understands medications and regiment? YES Patient understands to bring all medications to this visit? YES PT STATES THAT SHE HAS COUMADIN APPT TODAY TO DISCUSS AT APPT IF SHE HAS ANY QUESTION

## 2018-03-18 NOTE — Telephone Encounter (Signed)
03/10/2018 - 03/16/2018 ([facility]

## 2018-03-20 NOTE — Progress Notes (Signed)
Cardiology Office Note   Date:  03/21/2018   ID:  Sable, Knoles 1945-12-23, MRN [id number]  PCP:  Rennis Golden  Cardiologist:  Dr. Martinique Chief Complaint  Patient presents with  . Hospitalization Follow-up    discuss taking colace,  300mg  of CoQ10  . Mitral Valve Prolapse    s/p repair      History of Present Illness: Maria Gomez is a 72 y.o. female who presents for post hospital follow up after admission for severe mitral valve prolapse with TEE confirming moderately severe mitral regurgitation and bileaflet prolapse with partially flail segment involving the middle scallop of the posterior leaflet. She underwent cardiac cath on 02/18/2018 and found to have normal coronary arteries. She was referred to [place] who felt the patient would benefit from surgical repair.   She had minimally invasive mitral valve repair on 03/10/2018. She was started on low dose coumadin.   She is walking 3 times a day and is gradually increasing her time, up to 13 minutes each time. She has a chronic cough which she feels has worsened since leaving the hospital, but daughter who is with her reports that her cough was also occurring prior to surgery. She is otherwise doing well.   Past Medical History:  Diagnosis Date  . Hypothyroidism   . Mitral regurgitation   . Osteopenia   . S/P minimally invasive mitral valve repair 03/10/2018   Complex valvuloplasty including triangular resection of posterior leaflet, artificial Gore-tex neochord x6 and Size 30 Sorin Memo 4D Ring SN [id number] via right mini thoracotomy approach  . Thyroid nodule 03/07/2018   Incidental - noted on CT scan    Past Surgical History:  Procedure Laterality Date  . BREAST EXCISIONAL BIOPSY Bilateral   . MITRAL VALVE REPAIR Right 03/10/2018   Procedure: MINIMALLY INVASIVE MITRAL VALVE REPAIR (MVR):  -Triangular Resection of Posterior Leaflet P2; -Shortening Plasty of Posterior Leaflet P2; -Placement of Goretex Neo  Chords x 6 using Chord-X system; -Ring Annuloplasty with a 30 mm Sorin Memo 4D Ring;  Surgeon: Rexene Alberts, MD;  Location: [facility];  Service: Open Heart Surgery;  Laterality: Right;  . RIGHT/LEFT HEART CATH AND CORONARY ANGIOGRAPHY N/A 01/18/2018   Procedure: RIGHT/LEFT HEART CATH AND CORONARY ANGIOGRAPHY;  Surgeon: Martinique, Peter M, MD;  Location: [facility];  Service: Cardiovascular;  Laterality: N/A;  . TEE WITHOUT CARDIOVERSION N/A 11/09/2017   Procedure: TRANSESOPHAGEAL ECHOCARDIOGRAM (TEE);  Surgeon: Larey Dresser, MD;  Location: [facility] ENDOSCOPY;  Service: Cardiovascular;  Laterality: N/A;  . TEE WITHOUT CARDIOVERSION N/A 03/10/2018   Procedure: TRANSESOPHAGEAL ECHOCARDIOGRAM (TEE);  Surgeon: Rexene Alberts, MD;  Location: [facility];  Service: Open Heart Surgery;  Laterality: N/A;     Current Outpatient Medications  Medication Sig Dispense Refill  . acetaminophen (TYLENOL) 500 MG tablet Take 500 mg by mouth every 8 (eight) hours as needed.    Marland Kitchen alendronate (FOSAMAX) 70 MG tablet Take 1 tablet (70 mg total) by mouth every 7 (seven) days. Take with a full glass of water on an empty stomach.Sit upright for 30 minutes after taking 12 tablet 3  . aspirin EC 81 MG EC tablet Take 1 tablet (81 mg total) by mouth daily.    . Biotin 5000 MCG TABS Take 5,000 mcg by mouth daily.     . cetirizine (ZYRTEC) 10 MG tablet Take 1 tablet (10 mg total) by mouth daily. (Patient taking differently: Take 10 mg by mouth daily as needed  for allergies. ) 90 tablet 0  . Coenzyme Q10 (CO Q 10 PO) Take 300 mg by mouth daily.    Marland Kitchen docusate sodium (COLACE) 100 MG capsule Take 100 mg by mouth daily.    . furosemide (LASIX) 40 MG tablet Take 1 tablet (40 mg total) by mouth daily. 7 tablet 0  . levothyroxine (SYNTHROID, LEVOTHROID) 25 MCG tablet TAKE 1 TABLET EVERY DAY BEFORE BREAKFAST (Patient taking differently: Take 25 mcg by mouth daily before breakfast. TAKE 1 TABLET EVERY DAY BEFORE BREAKFAST) 90 tablet 2  .  Melatonin 5 MG CAPS Take 10 mg by mouth at bedtime.    . metoprolol tartrate (LOPRESSOR) 25 MG tablet Take 0.5 tablets (12.5 mg total) by mouth 2 (two) times daily. 30 tablet 3  . Multiple Vitamin (MULTIVITAMIN) tablet Take 1 tablet by mouth daily. Centrum Silver    . Probiotic Product (PROBIOTIC DAILY PO) Take 1 capsule by mouth daily. Florify    . Propylene Glycol (SYSTANE COMPLETE) 0.6 % SOLN Place 1 drop into both eyes 2 (two) times daily as needed (dry eyes).     . traMADol (ULTRAM) 50 MG tablet Take 1-2 tablets (50-100 mg total) by mouth every 4 (four) hours as needed for moderate pain. 30 tablet 0  . vitamin B-12 (CYANOCOBALAMIN) 1000 MCG tablet Take 1,000 mcg by mouth daily.    Marland Kitchen warfarin (COUMADIN) 4 MG tablet Take 1 tablet (4 mg total) by mouth daily at 6 PM. 30 tablet 3  . zinc gluconate 50 MG tablet Take 50 mg by mouth daily.    . benzonatate (TESSALON) 200 MG capsule Take 1 capsule (200 mg total) by mouth 3 (three) times daily as needed for cough. 30 capsule 0  . potassium chloride SA (K-DUR,KLOR-CON) 20 MEQ tablet Take 1 tablet (20 mEq total) by mouth daily for 1 day. 7 tablet 0   No current facility-administered medications for this visit.     Allergies:   Niaspan [niacin er]; Phenazopyridine hcl; and Statins    Social History:  The patient  reports that she has never smoked. She has never used smokeless tobacco. She reports that she does not drink alcohol or use drugs.   Family History:  The patient's family history includes Cancer (age of onset: 66) in her brother; Cancer (age of onset: 72) in her maternal grandmother; Heart disease in her father; Stroke in her paternal grandfather; Stroke (age of onset: 2) in her maternal grandfather.    ROS: All other systems are reviewed and negative. Unless otherwise mentioned in H&P    PHYSICAL EXAM: VS:  BP 112/62   Pulse 90   Ht 5\' 1"  (1.549 m)   Wt 157 lb (71.2 kg)   BMI 29.66 kg/m  , BMI Body mass index is 29.66 kg/m. GEN:  Well nourished, well developed, in no acute distress HEENT: normal Neck: no JVD, carotid bruits, or masses Cardiac: RRR; no murmurs, rubs, or gallops,no edema  Respiratory:  Clear to auscultation bilaterally, normal work of breathing GI: soft, nontender, nondistended, + BS MS: no deformity or atrophy, well healed incisions in the upper right neck and chest.  Skin: warm and dry, no rash Neuro:  Strength and sensation are intact Psych: euthymic mood, full affect   EKG:  Not completed this office visit.   Recent Labs: 08/16/2017: TSH 2.32 03/07/2018: ALT 14 03/11/2018: Magnesium 2.6 03/14/2018: Hemoglobin 9.3; Platelets 155 03/16/2018: BUN 10; Creatinine, Ser 0.89; Potassium 4.7; Sodium 138    Lipid Panel  Component Value Date/Time   CHOL 199 08/16/2017 0907   TRIG 193 (H) 08/16/2017 0907   HDL 45 (L) 08/16/2017 0907   CHOLHDL 4.4 08/16/2017 0907   VLDL 31 (H) 08/11/2016 0857   LDLCALC 123 (H) 08/16/2017 0907      Wt Readings from Last 3 Encounters:  03/21/18 157 lb (71.2 kg)  03/16/18 162 lb 11.2 oz (73.8 kg)  03/07/18 163 lb (73.9 kg)      Other studies Reviewed: Significant Diagnostic Studies: angiography:    The left ventricular systolic function is normal.  LV end diastolic pressure is normal.  The left ventricular ejection fraction is 55-65% by visual estimate.  There is severe (4+) mitral regurgitation and moderate mitral valve prolapse.  1. Normal coronary anatomy 2. Normal LV function 3. Mitral valve prolapse with severe MR 3-4+ 4. Normal right heart pressures 5. Normal LV filling pressures  No indication for antiplatelet therapy at this time.  Treatments: surgery:    Minimally-Invasive Mitral Valve Repair Complex valvuloplasty including triangular resection of flail segment (P2) of posterior leaflet Shortening plasty of posterior leaflet Artificial Gore-tex neochord placement x6 Sorin  Memo 4D Ring Annuloplasty (size 70mm, catalog #[id number], serial #[id number])  ASSESSMENT AND PLAN:  1.  Mitral Valve disease: S/P minimally invasive mitral valve repair. She is recovering well. She is seen by Pharm D today to be established in coumadin clinic. She will see her again in a week. She is tolerating her medications. She is to stop lasix and potassium in 3 days as directed.   2. Chronic cough: She was taken off of PPI during hospitalization.I will give her Tessalon Perles for symptomatic relief. I have advised her to rinse out her mouth after using neb tx. She may need further evaluation by ENT is cough persists. She is not on ACE or ARB. No hemoptysis.   3. Thyroid disease: Followed by PCP   Current medicines are reviewed at length with the patient today.    Labs/ tests ordered today include: None   Phill Myron. West Pugh, ANP, [facility]   03/21/2018 10:39 AM    [address] Office [phone] Fax [phone]

## 2018-03-21 ENCOUNTER — Ambulatory Visit: Payer: Self-pay

## 2018-03-21 ENCOUNTER — Other Ambulatory Visit: Payer: Self-pay | Admitting: Thoracic Surgery (Cardiothoracic Vascular Surgery)

## 2018-03-21 ENCOUNTER — Encounter (INDEPENDENT_AMBULATORY_CARE_PROVIDER_SITE_OTHER): Payer: Self-pay

## 2018-03-21 ENCOUNTER — Ambulatory Visit (INDEPENDENT_AMBULATORY_CARE_PROVIDER_SITE_OTHER): Payer: Medicare HMO | Admitting: Pharmacist

## 2018-03-21 ENCOUNTER — Ambulatory Visit
Admission: RE | Admit: 2018-03-21 | Discharge: 2018-03-21 | Disposition: A | Discharge status: Home / Self Care | Payer: Medicare HMO | Source: Ambulatory Visit | Attending: Thoracic Surgery (Cardiothoracic Vascular Surgery) | Admitting: Thoracic Surgery (Cardiothoracic Vascular Surgery)

## 2018-03-21 ENCOUNTER — Ambulatory Visit: Payer: Medicare HMO | Admitting: Adult Health

## 2018-03-21 ENCOUNTER — Encounter: Payer: Self-pay | Admitting: Adult Health

## 2018-03-21 VITALS — BP 112/62 | HR 90 | Ht 61.0 in | Wt 157.0 lb

## 2018-03-21 DIAGNOSIS — Z7901 Long term (current) use of anticoagulants: Secondary | ICD-10-CM

## 2018-03-21 DIAGNOSIS — Z4802 Encounter for removal of sutures: Secondary | ICD-10-CM

## 2018-03-21 DIAGNOSIS — I340 Nonrheumatic mitral (valve) insufficiency: Secondary | ICD-10-CM | POA: Diagnosis not present

## 2018-03-21 DIAGNOSIS — Z9889 Other specified postprocedural states: Secondary | ICD-10-CM

## 2018-03-21 DIAGNOSIS — R05 Cough: Secondary | ICD-10-CM | POA: Diagnosis not present

## 2018-03-21 DIAGNOSIS — J9811 Atelectasis: Secondary | ICD-10-CM | POA: Diagnosis not present

## 2018-03-21 DIAGNOSIS — R059 Cough, unspecified: Secondary | ICD-10-CM

## 2018-03-21 LAB — POCT INR: INR: 1.7 — AB (ref 2.0–3.0)

## 2018-03-21 MED ORDER — BENZONATATE 200 MG PO CAPS: 200.0000 mg | ORAL_CAPSULE | Freq: Three times a day (TID) | ORAL | 0 refills | Status: DC | PRN

## 2018-03-21 NOTE — Patient Instructions (Signed)
Follow-Up: You will need a follow up appointment in December .  You may see  DR Martinique, -or- one of the following Advanced Practice Providers on your designated Care Team:    . Kerin Ransom, PA-C . Daleen Snook Kroeger, PA-C . Sande Rives, PA-C  Special Instructions: CLEARED TO START CARDIAC REHAB  Medication Instructions:  NO CHANGES- Your physician recommends that you continue on your current medications as directed. Please refer to the Current Medication list given to you today.  If you need a refill on your cardiac medications before your next appointment, please call your pharmacy.  Labwork: If you have labs (blood work) drawn today and your tests are completely normal, you will receive your results ONLY by: . MyChart Message (if you have MyChart) -OR- . A paper copy in the mail  At [facility], you and your health needs are our priority.  As part of our continuing mission to provide you with exceptional heart care, we have created designated Provider Care Teams.  These Care Teams include your primary Cardiologist (physician) and Advanced Practice Providers (APPs -  Physician Assistants and Nurse Practitioners) who all work together to provide you with the care you need, when you need it.  Thank you for choosing [facility]!!

## 2018-03-29 ENCOUNTER — Ambulatory Visit: Payer: Medicare HMO | Admitting: Pharmacist

## 2018-03-29 DIAGNOSIS — Z7901 Long term (current) use of anticoagulants: Secondary | ICD-10-CM | POA: Diagnosis not present

## 2018-03-29 DIAGNOSIS — Z9889 Other specified postprocedural states: Secondary | ICD-10-CM | POA: Diagnosis not present

## 2018-03-29 LAB — POCT INR: INR: 2 (ref 2.0–3.0)

## 2018-04-04 ENCOUNTER — Other Ambulatory Visit: Payer: Self-pay

## 2018-04-04 ENCOUNTER — Ambulatory Visit: Payer: Medicare HMO | Admitting: Pharmacist Clinician (PhC)/ Clinical Pharmacy Specialist

## 2018-04-04 ENCOUNTER — Ambulatory Visit (INDEPENDENT_AMBULATORY_CARE_PROVIDER_SITE_OTHER): Payer: Self-pay | Admitting: Surgical

## 2018-04-04 VITALS — BP 120/70 | HR 48 | Resp 18 | Ht 61.0 in | Wt 152.4 lb

## 2018-04-04 DIAGNOSIS — Z9889 Other specified postprocedural states: Secondary | ICD-10-CM

## 2018-04-04 DIAGNOSIS — Z7901 Long term (current) use of anticoagulants: Secondary | ICD-10-CM

## 2018-04-04 LAB — POCT INR: INR: 2.3 (ref 2.0–3.0)

## 2018-04-04 NOTE — Progress Notes (Signed)
[address]       [place] [address]             [phone]      Orian W Hainer Mandan Medical Record #[id number] Date of Birth: 08/03/1945  Referring: Martinique, Peter M, MD Primary Care: Orlena Sheldon, [place] Primary Cardiologist: Peter Martinique, MD   Chief Complaint:   POST OP FOLLOW UP Date of Procedure:                03/10/2018  Preoperative Diagnosis:      Severe Mitral Regurgitation  Postoperative Diagnosis:    Same  Procedure:        Minimally-Invasive Mitral Valve Repair             Complex valvuloplasty including triangular resection of flail segment (P2) of posterior leaflet             Shortening plasty of posterior leaflet             Artificial Gore-tex neochord placement x6             Sorin Memo 4D Ring Annuloplasty (size 68mm, catalog # [id number], serial # [id number])               Surgeon:        Valentina Gu. Roxy Manns, MD  Assistant:       Ellwood Handler, PA-C  Anesthesia:    Roberts Gaudy, MD  Operative Findings: ? Fibroelastic deficiency type mxyomatous degenerative disease ? Ruptured primary chordae tendinae with flail segment (P2) of posterior leaflet ? Type II dysfunction with severe mitral regurgitation ? Normal left ventricular systolic function ? Trivial residual mitral regurgitation after successful valve repair  History of Present Illness:    The patient is a 72 year old female status post the above described procedure who was seen in the office on today's date in routine post surgical follow-up.  Overall she is quite pleased with her progress.  She is not requiring any narcotic pain medicines and only occasional Tylenol.  She does have some mild numbness associated with her right anterior chest region as well as her right lower leg.  She denies fevers, chills or other significant constitutional symptoms with the exception of a fairly chronic dry cough.  She was seen by cardiology last week and they gave her a  prescription for Lewayne Bunting which she feels has been ineffective.  The patient does describe a long history of pulmonary issues over her lifetime which she attributes to being a premature infant.  She also describes some reflux type symptoms.  She has been ambulating well.  She denies any palpitations.  She has had no difficulties with her incisions.  Overall, she is quite pleased with her progress.      Past Medical History:  Diagnosis Date  . Hypothyroidism   . Mitral regurgitation   . Osteopenia   . S/P minimally invasive mitral valve repair 03/10/2018   Complex valvuloplasty including triangular resection of posterior leaflet, artificial Gore-tex neochord x6 and Size 30 Sorin Memo 4D Ring SN [id number] via right mini thoracotomy approach  . Thyroid nodule 03/07/2018   Incidental - noted on CT scan     Social History   Tobacco Use  Smoking Status Never Smoker  Smokeless Tobacco Never Used    Social History   Substance and Sexual Activity  Alcohol Use No     Allergies  Allergen Reactions  . Niaspan [Niacin Er] Other (See  Comments)    Severe flushing  . Phenazopyridine Hcl Other (See Comments)    Made sick   . Statins     Severe flushing    Current Outpatient Medications  Medication Sig Dispense Refill  . acetaminophen (TYLENOL) 500 MG tablet Take 500 mg by mouth every 8 (eight) hours as needed.    Marland Kitchen alendronate (FOSAMAX) 70 MG tablet Take 1 tablet (70 mg total) by mouth every 7 (seven) days. Take with a full glass of water on an empty stomach.Sit upright for 30 minutes after taking 12 tablet 3  . aspirin EC 81 MG EC tablet Take 1 tablet (81 mg total) by mouth daily.    . benzonatate (TESSALON) 200 MG capsule Take 1 capsule (200 mg total) by mouth 3 (three) times daily as needed for cough. 30 capsule 0  . Biotin 5000 MCG TABS Take 5,000 mcg by mouth daily.     . cetirizine (ZYRTEC) 10 MG tablet Take 1 tablet (10 mg total) by mouth daily. (Patient taking differently:  Take 10 mg by mouth daily as needed for allergies. ) 90 tablet 0  . Coenzyme Q10 (CO Q 10 PO) Take 300 mg by mouth daily.    Marland Kitchen docusate sodium (COLACE) 100 MG capsule Take 100 mg by mouth daily.    . furosemide (LASIX) 40 MG tablet Take 1 tablet (40 mg total) by mouth daily. 7 tablet 0  . levothyroxine (SYNTHROID, LEVOTHROID) 25 MCG tablet TAKE 1 TABLET EVERY DAY BEFORE BREAKFAST (Patient taking differently: Take 25 mcg by mouth daily before breakfast. TAKE 1 TABLET EVERY DAY BEFORE BREAKFAST) 90 tablet 2  . Melatonin 5 MG CAPS Take 10 mg by mouth at bedtime.    . metoprolol tartrate (LOPRESSOR) 25 MG tablet Take 0.5 tablets (12.5 mg total) by mouth 2 (two) times daily. 30 tablet 3  . Multiple Vitamin (MULTIVITAMIN) tablet Take 1 tablet by mouth daily. Centrum Silver    . potassium chloride SA (K-DUR,KLOR-CON) 20 MEQ tablet Take 1 tablet (20 mEq total) by mouth daily for 1 day. 7 tablet 0  . Probiotic Product (PROBIOTIC DAILY PO) Take 1 capsule by mouth daily. Florify    . Propylene Glycol (SYSTANE COMPLETE) 0.6 % SOLN Place 1 drop into both eyes 2 (two) times daily as needed (dry eyes).     . traMADol (ULTRAM) 50 MG tablet Take 1-2 tablets (50-100 mg total) by mouth every 4 (four) hours as needed for moderate pain. 30 tablet 0  . vitamin B-12 (CYANOCOBALAMIN) 1000 MCG tablet Take 1,000 mcg by mouth daily.    Marland Kitchen warfarin (COUMADIN) 4 MG tablet Take 1 tablet (4 mg total) by mouth daily at 6 PM. 30 tablet 3  . zinc gluconate 50 MG tablet Take 50 mg by mouth daily.     No current facility-administered medications for this visit.        Physical Exam: There were no vitals taken for this visit.  General appearance: alert, cooperative and no distress Heart: regular rate and rhythm Lungs: clear to auscultation bilaterally Abdomen: Soft, nontender, nondistended Extremities: No edema Wound: Incisions healing well without evidence of infection   Diagnostic Studies & Laboratory data:      Recent Radiology Findings:   No results found.    Recent Lab Findings: Lab Results  Component Value Date   WBC 9.7 03/14/2018   HGB 9.3 (L) 03/14/2018   HCT 29.7 (L) 03/14/2018   PLT 155 03/14/2018   GLUCOSE 112 (H) 03/16/2018  CHOL 199 08/16/2017   TRIG 193 (H) 08/16/2017   HDL 45 (L) 08/16/2017   LDLCALC 123 (H) 08/16/2017   ALT 14 03/07/2018   AST 18 03/07/2018   NA 138 03/16/2018   K 4.7 03/16/2018   CL 108 03/16/2018   CREATININE 0.89 03/16/2018   BUN 10 03/16/2018   CO2 25 03/16/2018   TSH 2.32 08/16/2017   INR 2.3 04/04/2018   HGBA1C 5.5 03/07/2018      Assessment / Plan: The patient is overall doing quite well following her Emily invasive mitral valve repair.  He has no murmur.  He has no evidence of congestive failure.  She is not on an ACE inhibitor or ARB which could explain cough.  Her pulmonary examination is unremarkable at this time.  I did suggest that she could trial a short course of Zantac as potentially acid reflux could be a source of nonproductive cough.  I rechecked her heart rate during the examination and it was 60.  We will continue her current medical regimen.  She is getting her Coumadin monitored at the Coumadin clinic and most recent INR is 2.3 on today's date.  She will continue to be followed closely by cardiology and we will see her again in 2 months or prior to that for any surgically related issue or at request.  We did discuss further activity progression including driving and activities.      John Giovanni, PA-C 04/04/2018 2:13 PM

## 2018-04-04 NOTE — Patient Instructions (Signed)
Description   Increase dose to 1.5 tablets daily except 1 tablet each Sunday and Thursday.  Repeat INR in 1 week.

## 2018-04-04 NOTE — Patient Instructions (Signed)
Trial of Zantac as discussed Advancement of activities and driving protocols as discussed

## 2018-04-06 ENCOUNTER — Other Ambulatory Visit: Payer: Self-pay | Admitting: Cardiology

## 2018-04-06 ENCOUNTER — Telehealth: Payer: Self-pay | Admitting: Adult Health

## 2018-04-06 MED ORDER — WARFARIN SODIUM 4 MG PO TABS: ORAL_TABLET | ORAL | 3 refills | Status: DC

## 2018-04-06 NOTE — Telephone Encounter (Signed)
New Message   Pt c/o medication issue:  1. Name of Medication: benzonatate (TESSALON) 200 MG capsule   2. How are you currently taking this medication (dosage and times per day)?   3. Are you having a reaction (difficulty breathing--STAT)?   4. What is your medication issue? Patient states that she was prescribed this medication for a cough she has. She still has the cough and is wondering can she be prescribed Zyrtec. Please call to advise

## 2018-04-06 NOTE — Telephone Encounter (Signed)
°  Patient states she is out of medication and pharmacy will not refill until 11/15  1.  Are you calling in regards to an appointment? NO  2.  Are you calling for a refill ? YES  3.  Are you having bleeding issues? NO 4.  Do you need clearance to hold Coumadin? NO   Please route to the Coumadin Clinic Pool

## 2018-04-06 NOTE — Telephone Encounter (Signed)
Returned call to patient she stated she continues to have a non productive cough.She took Benzonatate that Jory Sims DNP prescribed with no relief.Stated Dr.Owen's PA prescribed Zantac and that has been recalled.She tried Pepcid with no relief.Stated she will try Zyrtec.Advised to call back if continues to have cough.

## 2018-04-11 ENCOUNTER — Encounter: Payer: Medicare HMO | Attending: Cardiology | Admitting: *Deleted

## 2018-04-11 ENCOUNTER — Telehealth: Payer: Self-pay | Admitting: Cardiology

## 2018-04-11 ENCOUNTER — Encounter: Payer: Self-pay | Admitting: *Deleted

## 2018-04-11 VITALS — Ht 62.5 in | Wt 157.1 lb

## 2018-04-11 DIAGNOSIS — Z9889 Other specified postprocedural states: Secondary | ICD-10-CM

## 2018-04-11 NOTE — Telephone Encounter (Signed)
PT NOTIFIED [place] PCP FOR FURTHER COUGH RELIEF

## 2018-04-11 NOTE — Telephone Encounter (Signed)
  Maria Gomez was prescribed an antibiotic by Jory Sims for her cough and was told that if that didn't get rid of it to call back. Ms. Brazell is still having the cough and would like to see if she could get something else called in for her

## 2018-04-11 NOTE — Patient Instructions (Signed)
Patient Instructions  Patient Details  Name: Maria Gomez MRN: [id number] Date of Birth: [birth year] Referring Provider:  Martinique, Peter M, MD  Below are your personal goals for exercise, nutrition, and risk factors. Our goal is to help you stay on track towards obtaining and maintaining these goals. We will be discussing your progress on these goals with you throughout the program.  Initial Exercise Prescription: Initial Exercise Prescription - 04/11/18 1200      Date of Initial Exercise RX and Referring Provider   Date  04/11/18    Referring Provider  Martinique, Peter MD      Treadmill   MPH  2    Grade  0.5    Minutes  15    METs  2.67      Recumbant Bike   Level  1    RPM  50    Watts  21    Minutes  15    METs  2.6      NuStep   Level  1    SPM  80    Minutes  15    METs  2      Prescription Details   Frequency (times per week)  3    Duration  Progress to 45 minutes of aerobic exercise without signs/symptoms of physical distress      Intensity   THRR 40-80% of Max Heartrate  120-139    Ratings of Perceived Exertion  11-13    Perceived Dyspnea  0-4      Progression   Progression  Continue to progress workloads to maintain intensity without signs/symptoms of physical distress.      Resistance Training   Training Prescription  Yes    Weight  3 lbs    Reps  10-15       Exercise Goals: Frequency: Be able to perform aerobic exercise two to three times per week in program working toward 2-5 days per week of home exercise.  Intensity: Work with a perceived exertion of 11 (fairly light) - 15 (hard) while following your exercise prescription.  We will make changes to your prescription with you as you progress through the program.   Duration: Be able to do 30 to 45 minutes of continuous aerobic exercise in addition to a 5 minute warm-up and a 5 minute cool-down routine.   Nutrition Goals: Your personal nutrition goals will be established when you do your  nutrition analysis with the dietician.  The following are general nutrition guidelines to follow: Cholesterol < 200mg /day Sodium < 1500mg /day Fiber: Women over 50 yrs - 21 grams per day  Personal Goals: Personal Goals and Risk Factors at Admission - 04/11/18 1140      Core Components/Risk Factors/Patient Goals on Admission    Weight Management  Yes;Weight Loss    Intervention  Weight Management: Develop a combined nutrition and exercise program designed to reach desired caloric intake, while maintaining appropriate intake of nutrient and fiber, sodium and fats, and appropriate energy expenditure required for the weight goal.;Weight Management: Provide education and appropriate resources to help participant work on and attain dietary goals.    Admit Weight  157 lb 1.6 oz (71.3 kg)    Goal Weight: Short Term  153 lb (69.4 kg)    Goal Weight: Long Term  145 lb (65.8 kg)    Expected Outcomes  Short Term: Continue to assess and modify interventions until short term weight is achieved;Long Term: Adherence to nutrition and physical activity/exercise program aimed toward  attainment of established weight goal;Weight Loss: Understanding of general recommendations for a balanced deficit meal plan, which promotes 1-2 lb weight loss per week and includes a negative energy balance of [phone] kcal/d;Understanding recommendations for meals to include 15-35% energy as protein, 25-35% energy from fat, 35-60% energy from carbohydrates, less than 200mg  of dietary cholesterol, 20-35 gm of total fiber daily;Understanding of distribution of calorie intake throughout the day with the consumption of 4-5 meals/snacks       Tobacco Use Initial Evaluation: Social History   Tobacco Use  Smoking Status Never Smoker  Smokeless Tobacco Never Used    Exercise Goals and Review: Exercise Goals    Row Name 04/11/18 1244             Exercise Goals   Increase Physical Activity  Yes       Intervention  Provide  advice, education, support and counseling about physical activity/exercise needs.;Develop an individualized exercise prescription for aerobic and resistive training based on initial evaluation findings, risk stratification, comorbidities and participant's personal goals.       Expected Outcomes  Short Term: Attend rehab on a regular basis to increase amount of physical activity.;Long Term: Add in home exercise to make exercise part of routine and to increase amount of physical activity.;Long Term: Exercising regularly at least 3-5 days a week.       Increase Strength and Stamina  Yes       Intervention  Provide advice, education, support and counseling about physical activity/exercise needs.;Develop an individualized exercise prescription for aerobic and resistive training based on initial evaluation findings, risk stratification, comorbidities and participant's personal goals.       Expected Outcomes  Short Term: Increase workloads from initial exercise prescription for resistance, speed, and METs.;Short Term: Perform resistance training exercises routinely during rehab and add in resistance training at home;Long Term: Improve cardiorespiratory fitness, muscular endurance and strength as measured by increased METs and functional capacity (6MWT)       Able to understand and use rate of perceived exertion (RPE) scale  Yes       Intervention  Provide education and explanation on how to use RPE scale       Expected Outcomes  Short Term: Able to use RPE daily in rehab to express subjective intensity level;Long Term:  Able to use RPE to guide intensity level when exercising independently       Knowledge and understanding of Target Heart Rate Range (THRR)  Yes       Intervention  Provide education and explanation of THRR including how the numbers were predicted and where they are located for reference       Expected Outcomes  Short Term: Able to state/look up THRR;Short Term: Able to use daily as guideline for  intensity in rehab;Long Term: Able to use THRR to govern intensity when exercising independently       Able to check pulse independently  Yes       Intervention  Provide education and demonstration on how to check pulse in carotid and radial arteries.;Review the importance of being able to check your own pulse for safety during independent exercise       Expected Outcomes  Short Term: Able to explain why pulse checking is important during independent exercise;Long Term: Able to check pulse independently and accurately       Understanding of Exercise Prescription  Yes       Intervention  Provide education, explanation, and written materials on patient's individual exercise  prescription       Expected Outcomes  Short Term: Able to explain program exercise prescription;Long Term: Able to explain home exercise prescription to exercise independently          Copy of goals given to participant.

## 2018-04-11 NOTE — Progress Notes (Signed)
Cardiac Individual Treatment Plan  Patient Details  Name: Maria Gomez MRN: [id number] Date of Birth: Sep 07, 1945 Referring Provider:     Cardiac Rehab from 04/11/2018 in [facility] Cardiac and Pulmonary Rehab  Referring Provider  Martinique, Peter MD      Initial Encounter Date:    Cardiac Rehab from 04/11/2018 in [facility] Cardiac and Pulmonary Rehab  Date  04/11/18      Visit Diagnosis: S/P mitral valve repair  Patient's Home Medications on Admission:  Current Outpatient Medications:  .  acetaminophen (TYLENOL) 500 MG tablet, Take 500 mg by mouth every 8 (eight) hours as needed., Disp: , Rfl:  .  alendronate (FOSAMAX) 70 MG tablet, Take 1 tablet (70 mg total) by mouth every 7 (seven) days. Take with a full glass of water on an empty stomach.Sit upright for 30 minutes after taking, Disp: 12 tablet, Rfl: 3 .  aspirin EC 81 MG EC tablet, Take 1 tablet (81 mg total) by mouth daily., Disp: , Rfl:  .  benzonatate (TESSALON) 200 MG capsule, Take 1 capsule (200 mg total) by mouth 3 (three) times daily as needed for cough., Disp: 30 capsule, Rfl: 0 .  Biotin 5000 MCG TABS, Take 5,000 mcg by mouth daily. , Disp: , Rfl:  .  cetirizine (ZYRTEC) 10 MG tablet, Take 1 tablet (10 mg total) by mouth daily. (Patient taking differently: Take 10 mg by mouth daily as needed for allergies. ), Disp: 90 tablet, Rfl: 0 .  Coenzyme Q10 (CO Q 10 PO), Take 300 mg by mouth daily., Disp: , Rfl:  .  docusate sodium (COLACE) 100 MG capsule, Take 100 mg by mouth daily., Disp: , Rfl:  .  levothyroxine (SYNTHROID, LEVOTHROID) 25 MCG tablet, TAKE 1 TABLET EVERY DAY BEFORE BREAKFAST (Patient taking differently: Take 25 mcg by mouth daily before breakfast. TAKE 1 TABLET EVERY DAY BEFORE BREAKFAST), Disp: 90 tablet, Rfl: 2 .  Melatonin 5 MG CAPS, Take 10 mg by mouth at bedtime., Disp: , Rfl:  .  metoprolol tartrate (LOPRESSOR) 25 MG tablet, Take 0.5 tablets (12.5 mg total) by mouth 2 (two) times daily., Disp: 30 tablet, Rfl: 3 .   Multiple Vitamin (MULTIVITAMIN) tablet, Take 1 tablet by mouth daily. Centrum Silver, Disp: , Rfl:  .  Probiotic Product (PROBIOTIC DAILY PO), Take 1 capsule by mouth daily. Florify, Disp: , Rfl:  .  Propylene Glycol (SYSTANE COMPLETE) 0.6 % SOLN, Place 1 drop into both eyes 2 (two) times daily as needed (dry eyes). , Disp: , Rfl:  .  vitamin B-12 (CYANOCOBALAMIN) 1000 MCG tablet, Take 1,000 mcg by mouth daily., Disp: , Rfl:  .  warfarin (COUMADIN) 4 MG tablet, Take 1 to 1.5 tablets by mouth daily as directed by coumadin clinic, Disp: 45 tablet, Rfl: 3 .  zinc gluconate 50 MG tablet, Take 50 mg by mouth daily., Disp: , Rfl:  .  furosemide (LASIX) 40 MG tablet, Take 1 tablet (40 mg total) by mouth daily. (Patient not taking: Reported on 04/11/2018), Disp: 7 tablet, Rfl: 0 .  potassium chloride SA (K-DUR,KLOR-CON) 20 MEQ tablet, Take 1 tablet (20 mEq total) by mouth daily for 1 day., Disp: 7 tablet, Rfl: 0  Past Medical History: Past Medical History:  Diagnosis Date  . Hypothyroidism   . Mitral regurgitation   . Osteopenia   . S/P minimally invasive mitral valve repair 03/10/2018   Complex valvuloplasty including triangular resection of posterior leaflet, artificial Gore-tex neochord x6 and Size 30 Sorin Memo 4D Ring SN  [id number] via right mini thoracotomy approach  . Thyroid nodule 03/07/2018   Incidental - noted on CT scan    Tobacco Use: Social History   Tobacco Use  Smoking Status Never Smoker  Smokeless Tobacco Never Used    Labs: Recent Review Flowsheet Data    Labs for ITP Cardiac and Pulmonary Rehab Latest Ref Rng & Units 03/10/2018 03/10/2018 03/10/2018 03/10/2018 03/11/2018   Cholestrol <200 mg/dL - - - - -   LDLCALC mg/dL (calc) - - - - -   HDL >50 mg/dL - - - - -   Trlycerides <150 mg/dL - - - - -   Hemoglobin A1c 4.8 - 5.6 % - - - - -   PHART 7.350 - 7.450 7.423 7.319(L) 7.319(L) - -   PCO2ART 32.0 - 48.0 mmHg 28.5(L) 43.3 37.6 - -   HCO3 20.0 - 28.0 mmol/L 18.9(L)  22.3 19.4(L) - -   TCO2 22 - 32 mmol/L 20(L) 24 20(L) 21(L) 19(L)   ACIDBASEDEF 0.0 - 2.0 mmol/L 5.0(H) 4.0(H) 6.0(H) - -   O2SAT % 98.0 98.0 95.0 - -       Exercise Target Goals: Exercise Program Goal: Individual exercise prescription set using results from initial 6 min walk test and THRR while considering  patient's activity barriers and safety.   Exercise Prescription Goal: Initial exercise prescription builds to 30-45 minutes a day of aerobic activity, 2-3 days per week.  Home exercise guidelines will be given to patient during program as part of exercise prescription that the participant will acknowledge.  Activity Barriers & Risk Stratification: Activity Barriers & Cardiac Risk Stratification - 04/11/18 1143      Activity Barriers & Cardiac Risk Stratification   Activity Barriers  None    Cardiac Risk Stratification  Low       6 Minute Walk: 6 Minute Walk    Row Name 04/11/18 1236         6 Minute Walk   Phase  Initial     Distance  1264 feet     Walk Time  6 minutes     # of Rest Breaks  0     MPH  2.39     METS  2.93     RPE  12     VO2 Peak  10.26     Symptoms  No     Resting HR  101 bpm     Resting BP  122/62     Resting Oxygen Saturation   97 %     Exercise Oxygen Saturation  during 6 min walk  100 %     Max Ex. HR  131 bpm     Max Ex. BP  134/68     2 Minute Post BP  124/62        Oxygen Initial Assessment:   Oxygen Re-Evaluation:   Oxygen Discharge (Final Oxygen Re-Evaluation):   Initial Exercise Prescription: Initial Exercise Prescription - 04/11/18 1200      Date of Initial Exercise RX and Referring Provider   Date  04/11/18    Referring Provider  Martinique, Peter MD      Treadmill   MPH  2    Grade  0.5    Minutes  15    METs  2.67      Recumbant Bike   Level  1    RPM  50    Watts  21    Minutes  15    METs  2.6  NuStep   Level  1    SPM  80    Minutes  15    METs  2      Prescription Details   Frequency (times  per week)  3    Duration  Progress to 45 minutes of aerobic exercise without signs/symptoms of physical distress      Intensity   THRR 40-80% of Max Heartrate  120-139    Ratings of Perceived Exertion  11-13    Perceived Dyspnea  0-4      Progression   Progression  Continue to progress workloads to maintain intensity without signs/symptoms of physical distress.      Resistance Training   Training Prescription  Yes    Weight  3 lbs    Reps  10-15       Perform Capillary Blood Glucose checks as needed.  Exercise Prescription Changes: Exercise Prescription Changes    Row Name 04/11/18 1200             Response to Exercise   Blood Pressure (Admit)  122/62       Blood Pressure (Exercise)  134/68       Blood Pressure (Exit)  124/62       Heart Rate (Admit)  101 bpm       Heart Rate (Exercise)  131 bpm       Heart Rate (Exit)  110 bpm       Oxygen Saturation (Admit)  97 %       Oxygen Saturation (Exercise)  100 %       Rating of Perceived Exertion (Exercise)  12       Symptoms  none       Comments  walk test results          Exercise Comments:   Exercise Goals and Review: Exercise Goals    Row Name 04/11/18 1244             Exercise Goals   Increase Physical Activity  Yes       Intervention  Provide advice, education, support and counseling about physical activity/exercise needs.;Develop an individualized exercise prescription for aerobic and resistive training based on initial evaluation findings, risk stratification, comorbidities and participant's personal goals.       Expected Outcomes  Short Term: Attend rehab on a regular basis to increase amount of physical activity.;Long Term: Add in home exercise to make exercise part of routine and to increase amount of physical activity.;Long Term: Exercising regularly at least 3-5 days a week.       Increase Strength and Stamina  Yes       Intervention  Provide advice, education, support and counseling about physical  activity/exercise needs.;Develop an individualized exercise prescription for aerobic and resistive training based on initial evaluation findings, risk stratification, comorbidities and participant's personal goals.       Expected Outcomes  Short Term: Increase workloads from initial exercise prescription for resistance, speed, and METs.;Short Term: Perform resistance training exercises routinely during rehab and add in resistance training at home;Long Term: Improve cardiorespiratory fitness, muscular endurance and strength as measured by increased METs and functional capacity (6MWT)       Able to understand and use rate of perceived exertion (RPE) scale  Yes       Intervention  Provide education and explanation on how to use RPE scale       Expected Outcomes  Short Term: Able to use RPE daily in rehab to express subjective  intensity level;Long Term:  Able to use RPE to guide intensity level when exercising independently       Knowledge and understanding of Target Heart Rate Range (THRR)  Yes       Intervention  Provide education and explanation of THRR including how the numbers were predicted and where they are located for reference       Expected Outcomes  Short Term: Able to state/look up THRR;Short Term: Able to use daily as guideline for intensity in rehab;Long Term: Able to use THRR to govern intensity when exercising independently       Able to check pulse independently  Yes       Intervention  Provide education and demonstration on how to check pulse in carotid and radial arteries.;Review the importance of being able to check your own pulse for safety during independent exercise       Expected Outcomes  Short Term: Able to explain why pulse checking is important during independent exercise;Long Term: Able to check pulse independently and accurately       Understanding of Exercise Prescription  Yes       Intervention  Provide education, explanation, and written materials on patient's individual  exercise prescription       Expected Outcomes  Short Term: Able to explain program exercise prescription;Long Term: Able to explain home exercise prescription to exercise independently          Exercise Goals Re-Evaluation :   Discharge Exercise Prescription (Final Exercise Prescription Changes): Exercise Prescription Changes - 04/11/18 1200      Response to Exercise   Blood Pressure (Admit)  122/62    Blood Pressure (Exercise)  134/68    Blood Pressure (Exit)  124/62    Heart Rate (Admit)  101 bpm    Heart Rate (Exercise)  131 bpm    Heart Rate (Exit)  110 bpm    Oxygen Saturation (Admit)  97 %    Oxygen Saturation (Exercise)  100 %    Rating of Perceived Exertion (Exercise)  12    Symptoms  none    Comments  walk test results       Nutrition:  Target Goals: Understanding of nutrition guidelines, daily intake of sodium <1565m, cholesterol <2069m calories 30% from fat and 7% or less from saturated fats, daily to have 5 or more servings of fruits and vegetables.  Biometrics: Pre Biometrics - 04/11/18 1245      Pre Biometrics   Height  5' 2.5" (1.588 m)    Weight  157 lb 1.6 oz (71.3 kg)    Waist Circumference  33 inches    Hip Circumference  41 inches    Waist to Hip Ratio  0.8 %    BMI (Calculated)  28.26    Single Leg Stand  4.48 seconds        Nutrition Therapy Plan and Nutrition Goals: Nutrition Therapy & Goals - 04/11/18 1143      Intervention Plan   Intervention  Prescribe, educate and counsel regarding individualized specific dietary modifications aiming towards targeted core components such as weight, hypertension, lipid management, diabetes, heart failure and other comorbidities.;Nutrition handout(s) given to patient.    Expected Outcomes  Short Term Goal: Understand basic principles of dietary content, such as calories, fat, sodium, cholesterol and nutrients.;Long Term Goal: Adherence to prescribed nutrition plan.;Short Term Goal: A plan has been developed  with personal nutrition goals set during dietitian appointment.       Nutrition Assessments: Nutrition Assessments -  04/11/18 1033      MEDFICTS Scores   Pre Score  18       Nutrition Goals Re-Evaluation:   Nutrition Goals Discharge (Final Nutrition Goals Re-Evaluation):   Psychosocial: Target Goals: Acknowledge presence or absence of significant depression and/or stress, maximize coping skills, provide positive support system. Participant is able to verbalize types and ability to use techniques and skills needed for reducing stress and depression.   Initial Review & Psychosocial Screening: Initial Psych Review & Screening - 04/11/18 1141      Initial Review   Current issues with  Current Sleep Concerns;Current Stress Concerns    Comments  Fraser Din lives with her 60 year old mother. She helps care for her and shared that it can be stressful at times. She feels like she has a good support system with her son and friends. She lost her husband 22 years ago and reports that she has never slept the same since. Her surgical discomfort has made sleeping more of an issue lately, but said its getting better.       Family Dynamics   Good Support System?  Yes   son, daughter-in-law, friends     Barriers   Psychosocial barriers to participate in program  There are no identifiable barriers or psychosocial needs.;The patient should benefit from training in stress management and relaxation.      Screening Interventions   Interventions  Encouraged to exercise;Program counselor consult;To provide support and resources with identified psychosocial needs;Provide feedback about the scores to participant    Expected Outcomes  Short Term goal: Utilizing psychosocial counselor, staff and physician to assist with identification of specific Stressors or current issues interfering with healing process. Setting desired goal for each stressor or current issue identified.;Long Term Goal: Stressors or current  issues are controlled or eliminated.;Short Term goal: Identification and review with participant of any Quality of Life or Depression concerns found by scoring the questionnaire.;Long Term goal: The participant improves quality of Life and PHQ9 Scores as seen by post scores and/or verbalization of changes       Quality of Life Scores:  Quality of Life - 04/11/18 1032      Quality of Life   Select  Quality of Life      Quality of Life Scores   Health/Function Pre  26.31 %    Socioeconomic Pre  30 %    Psych/Spiritual Pre  29.14 %    Family Pre  30 %    GLOBAL Pre  28.14 %      Scores of 19 and below usually indicate a poorer quality of life in these areas.  A difference of  2-3 points is a clinically meaningful difference.  A difference of 2-3 points in the total score of the Quality of Life Index has been associated with significant improvement in overall quality of life, self-image, physical symptoms, and general health in studies assessing change in quality of life.  PHQ-9: Recent Review Flowsheet Data    Depression screen [facility] 2/9 04/11/2018 04/04/2018 08/23/2017 05/10/2017 08/17/2016   Decreased Interest 0 0 0 0 0   Down, Depressed, Hopeless 0 0 0 0 0   PHQ - 2 Score 0 0 0 0 0   Altered sleeping 1 - - - 0   Tired, decreased energy 1 - - - 0   Change in appetite 0 - - - 0   Feeling bad or failure about yourself  0 - - - 0   Trouble concentrating  0 - - - 0   Moving slowly or fidgety/restless 0 - - - 0   Suicidal thoughts 0 - - - 0   PHQ-9 Score 2 - - - 0   Difficult doing work/chores Not difficult at all - - - Not difficult at all     Interpretation of Total Score  Total Score Depression Severity:  1-4 = Minimal depression, 5-9 = Mild depression, 10-14 = Moderate depression, 15-19 = Moderately severe depression, 20-27 = Severe depression   Psychosocial Evaluation and Intervention:   Psychosocial Re-Evaluation:   Psychosocial Discharge (Final Psychosocial  Re-Evaluation):   Vocational Rehabilitation: Provide vocational rehab assistance to qualifying candidates.   Vocational Rehab Evaluation & Intervention: Vocational Rehab - 04/11/18 1033      Initial Vocational Rehab Evaluation & Intervention   Assessment shows need for Vocational Rehabilitation  No       Education: Education Goals: Education classes will be provided on a variety of topics geared toward better understanding of heart health and risk factor modification. Participant will state understanding/return demonstration of topics presented as noted by education test scores.  Learning Barriers/Preferences: Learning Barriers/Preferences - 04/11/18 1141      Learning Barriers/Preferences   Learning Barriers  None    Learning Preferences  None       Education Topics:  AED/CPR: - Group verbal and written instruction with the use of models to demonstrate the basic use of the AED with the basic ABC's of resuscitation.   General Nutrition Guidelines/Fats and Fiber: -Group instruction provided by verbal, written material, models and posters to present the general guidelines for heart healthy nutrition. Gives an explanation and review of dietary fats and fiber.   Controlling Sodium/Reading Food Labels: -Group verbal and written material supporting the discussion of sodium use in heart healthy nutrition. Review and explanation with models, verbal and written materials for utilization of the food label.   Exercise Physiology & General Exercise Guidelines: - Group verbal and written instruction with models to review the exercise physiology of the cardiovascular system and associated critical values. Provides general exercise guidelines with specific guidelines to those with heart or lung disease.    Aerobic Exercise & Resistance Training: - Gives group verbal and written instruction on the various components of exercise. Focuses on aerobic and resistive training programs and the  benefits of this training and how to safely progress through these programs..   Flexibility, Balance, Mind/Body Relaxation: Provides group verbal/written instruction on the benefits of flexibility and balance training, including mind/body exercise modes such as yoga, pilates and tai chi.  Demonstration and skill practice provided.   Stress and Anxiety: - Provides group verbal and written instruction about the health risks of elevated stress and causes of high stress.  Discuss the correlation between heart/lung disease and anxiety and treatment options. Review healthy ways to manage with stress and anxiety.   Depression: - Provides group verbal and written instruction on the correlation between heart/lung disease and depressed mood, treatment options, and the stigmas associated with seeking treatment.   Anatomy & Physiology of the Heart: - Group verbal and written instruction and models provide basic cardiac anatomy and physiology, with the coronary electrical and arterial systems. Review of Valvular disease and Heart Failure   Cardiac Procedures: - Group verbal and written instruction to review commonly prescribed medications for heart disease. Reviews the medication, class of the drug, and side effects. Includes the steps to properly store meds and maintain the prescription regimen. (beta blockers and nitrates)  Cardiac Medications I: - Group verbal and written instruction to review commonly prescribed medications for heart disease. Reviews the medication, class of the drug, and side effects. Includes the steps to properly store meds and maintain the prescription regimen.   Cardiac Medications II: -Group verbal and written instruction to review commonly prescribed medications for heart disease. Reviews the medication, class of the drug, and side effects. (all other drug classes)    Go Sex-Intimacy & Heart Disease, Get SMART - Goal Setting: - Group verbal and written instruction  through game format to discuss heart disease and the return to sexual intimacy. Provides group verbal and written material to discuss and apply goal setting through the application of the S.M.A.R.T. Method.   Other Matters of the Heart: - Provides group verbal, written materials and models to describe Stable Angina and Peripheral Artery. Includes description of the disease process and treatment options available to the cardiac patient.   Exercise & Equipment Safety: - Individual verbal instruction and demonstration of equipment use and safety with use of the equipment.   Cardiac Rehab from 04/11/2018 in [facility] Cardiac and Pulmonary Rehab  Date  04/11/18  Educator  [facility]  Instruction Review Code  1- Verbalizes Understanding      Infection Prevention: - Provides verbal and written material to individual with discussion of infection control including proper hand washing and proper equipment cleaning during exercise session.   Cardiac Rehab from 04/11/2018 in [facility] Cardiac and Pulmonary Rehab  Date  04/11/18  Educator  [facility]  Instruction Review Code  1- Verbalizes Understanding      Falls Prevention: - Provides verbal and written material to individual with discussion of falls prevention and safety.   Cardiac Rehab from 04/11/2018 in [facility] Cardiac and Pulmonary Rehab  Date  04/11/18  Educator  [facility]  Instruction Review Code  1- Verbalizes Understanding      Diabetes: - Individual verbal and written instruction to review signs/symptoms of diabetes, desired ranges of glucose level fasting, after meals and with exercise. Acknowledge that pre and post exercise glucose checks will be done for 3 sessions at entry of program.   Know Your Numbers and Risk Factors: -Group verbal and written instruction about important numbers in your health.  Discussion of what are risk factors and how they play a role in the disease process.  Review of Cholesterol, Blood Pressure, Diabetes, and BMI and the role they  play in your overall health.   Sleep Hygiene: -Provides group verbal and written instruction about how sleep can affect your health.  Define sleep hygiene, discuss sleep cycles and impact of sleep habits. Review good sleep hygiene tips.    Other: -Provides group and verbal instruction on various topics (see comments)   Knowledge Questionnaire Score: Knowledge Questionnaire Score - 04/11/18 1032      Knowledge Questionnaire Score   Pre Score  24/26   correct answers reviewed with Fraser Din; focus on stress and angina      Core Components/Risk Factors/Patient Goals at Admission: Personal Goals and Risk Factors at Admission - 04/11/18 1140      Core Components/Risk Factors/Patient Goals on Admission    Weight Management  Yes;Weight Loss    Intervention  Weight Management: Develop a combined nutrition and exercise program designed to reach desired caloric intake, while maintaining appropriate intake of nutrient and fiber, sodium and fats, and appropriate energy expenditure required for the weight goal.;Weight Management: Provide education and appropriate resources to help participant work on and attain dietary goals.  Admit Weight  157 lb 1.6 oz (71.3 kg)    Goal Weight: Short Term  153 lb (69.4 kg)    Goal Weight: Long Term  145 lb (65.8 kg)    Expected Outcomes  Short Term: Continue to assess and modify interventions until short term weight is achieved;Long Term: Adherence to nutrition and physical activity/exercise program aimed toward attainment of established weight goal;Weight Loss: Understanding of general recommendations for a balanced deficit meal plan, which promotes 1-2 lb weight loss per week and includes a negative energy balance of [phone] kcal/d;Understanding recommendations for meals to include 15-35% energy as protein, 25-35% energy from fat, 35-60% energy from carbohydrates, less than 265m of dietary cholesterol, 20-35 gm of total fiber daily;Understanding of distribution of  calorie intake throughout the day with the consumption of 4-5 meals/snacks       Core Components/Risk Factors/Patient Goals Review:    Core Components/Risk Factors/Patient Goals at Discharge (Final Review):    ITP Comments: ITP Comments    Row Name 04/11/18 1131           ITP Comments  Med Review completed. Initial ITP created. [facility]          Comments: Initial ITP

## 2018-04-11 NOTE — Telephone Encounter (Signed)
Patient was not prescribed antibiotic. She was given Ladona Ridgel for symptomatic relief and was advised to see PCP or ENT for persistent coughing. I will not prescribe anymore medications for this symptom as this is not cardiac induced.

## 2018-04-12 ENCOUNTER — Ambulatory Visit
Admission: RE | Admit: 2018-04-12 | Discharge: 2018-04-12 | Disposition: A | Discharge status: Home / Self Care | Payer: Medicare HMO | Source: Ambulatory Visit | Attending: Family Medicine | Admitting: Family Medicine

## 2018-04-12 ENCOUNTER — Ambulatory Visit: Payer: Medicare HMO | Admitting: Pharmacist Clinician (PhC)/ Clinical Pharmacy Specialist

## 2018-04-12 ENCOUNTER — Encounter: Payer: Self-pay | Admitting: Family Medicine

## 2018-04-12 ENCOUNTER — Ambulatory Visit (INDEPENDENT_AMBULATORY_CARE_PROVIDER_SITE_OTHER): Payer: Medicare HMO | Admitting: Family Medicine

## 2018-04-12 VITALS — BP 120/88 | HR 107 | Temp 98.5°F | Resp 16 | Ht 61.5 in | Wt 154.0 lb

## 2018-04-12 DIAGNOSIS — R053 Chronic cough: Secondary | ICD-10-CM

## 2018-04-12 DIAGNOSIS — R05 Cough: Secondary | ICD-10-CM

## 2018-04-12 DIAGNOSIS — Z9889 Other specified postprocedural states: Secondary | ICD-10-CM

## 2018-04-12 DIAGNOSIS — Z7901 Long term (current) use of anticoagulants: Secondary | ICD-10-CM | POA: Diagnosis not present

## 2018-04-12 DIAGNOSIS — R058 Other specified cough: Secondary | ICD-10-CM

## 2018-04-12 LAB — POCT INR: INR: 2.5 (ref 2.0–3.0)

## 2018-04-12 MED ORDER — TRAMADOL HCL 50 MG PO TABS: 50.0000 mg | ORAL_TABLET | Freq: Three times a day (TID) | ORAL | 0 refills | Status: DC | PRN

## 2018-04-12 MED ORDER — FLUTICASONE PROPIONATE 50 MCG/ACT NA SUSP: 2.0000 | Freq: Every day | NASAL | 6 refills | Status: DC

## 2018-04-12 MED ORDER — PANTOPRAZOLE SODIUM 40 MG PO TBEC: 40.0000 mg | DELAYED_RELEASE_TABLET | Freq: Every day | ORAL | 3 refills | Status: DC

## 2018-04-12 NOTE — Progress Notes (Signed)
Subjective:    Patient ID: Maria Gomez, female    DOB: [birth year], 72 y.o.   MRN: [id number]  HPI Patient had heart surgery approximately 1 month ago to repair mitral valve.  She is done extremely well postoperatively and has been started on Coumadin.  Unfortunately, she is developed a chronic nonproductive cough that she describes as an itch or a tingle in the back of her throat.  It tends to be worse during the day when she is up walking around while she is trying to talk to people or breathing cool air.  He denies any hemoptysis.  She denies any chest pain.  She denies any shortness of breath.  She denies any fevers or chills.  She denies any recent travel or exposures.  She tried Zyrtec on her own as well as Tessalon without any relief.  She denies any heartburn. Past Medical History:  Diagnosis Date  . Hypothyroidism   . Mitral regurgitation   . Osteopenia   . S/P minimally invasive mitral valve repair 03/10/2018   Complex valvuloplasty including triangular resection of posterior leaflet, artificial Gore-tex neochord x6 and Size 30 Sorin Memo 4D Ring SN [id number] via right mini thoracotomy approach  . Thyroid nodule 03/07/2018   Incidental - noted on CT scan   Past Surgical History:  Procedure Laterality Date  . BREAST EXCISIONAL BIOPSY Bilateral   . MITRAL VALVE REPAIR Right 03/10/2018   Procedure: MINIMALLY INVASIVE MITRAL VALVE REPAIR (MVR):  -Triangular Resection of Posterior Leaflet P2; -Shortening Plasty of Posterior Leaflet P2; -Placement of Goretex Neo Chords x 6 using Chord-X system; -Ring Annuloplasty with a 30 mm Sorin Memo 4D Ring;  Surgeon: Rexene Alberts, MD;  Location: [facility];  Service: Open Heart Surgery;  Laterality: Right;  . RIGHT/LEFT HEART CATH AND CORONARY ANGIOGRAPHY N/A 01/18/2018   Procedure: RIGHT/LEFT HEART CATH AND CORONARY ANGIOGRAPHY;  Surgeon: Martinique, Peter M, MD;  Location: [place] [facility];  Service: Cardiovascular;  Laterality: N/A;  . TEE  WITHOUT CARDIOVERSION N/A 11/09/2017   Procedure: TRANSESOPHAGEAL ECHOCARDIOGRAM (TEE);  Surgeon: Larey Dresser, MD;  Location: [facility] ENDOSCOPY;  Service: Cardiovascular;  Laterality: N/A;  . TEE WITHOUT CARDIOVERSION N/A 03/10/2018   Procedure: TRANSESOPHAGEAL ECHOCARDIOGRAM (TEE);  Surgeon: Rexene Alberts, MD;  Location: [place];  Service: Open Heart Surgery;  Laterality: N/A;   Current Outpatient Medications on File Prior to Visit  Medication Sig Dispense Refill  . acetaminophen (TYLENOL) 500 MG tablet Take 500 mg by mouth every 8 (eight) hours as needed.    Marland Kitchen alendronate (FOSAMAX) 70 MG tablet Take 1 tablet (70 mg total) by mouth every 7 (seven) days. Take with a full glass of water on an empty stomach.Sit upright for 30 minutes after taking 12 tablet 3  . aspirin EC 81 MG EC tablet Take 1 tablet (81 mg total) by mouth daily.    . benzonatate (TESSALON) 200 MG capsule Take 1 capsule (200 mg total) by mouth 3 (three) times daily as needed for cough. 30 capsule 0  . Biotin 5000 MCG TABS Take 5,000 mcg by mouth daily.     . cetirizine (ZYRTEC) 10 MG tablet Take 1 tablet (10 mg total) by mouth daily. (Patient taking differently: Take 10 mg by mouth daily as needed for allergies. ) 90 tablet 0  . Coenzyme Q10 (CO Q 10 PO) Take 300 mg by mouth daily.    Marland Kitchen docusate sodium (COLACE) 100 MG capsule Take 100 mg by mouth daily.    Marland Kitchen  furosemide (LASIX) 40 MG tablet Take 1 tablet (40 mg total) by mouth daily. 7 tablet 0  . levothyroxine (SYNTHROID, LEVOTHROID) 25 MCG tablet TAKE 1 TABLET EVERY DAY BEFORE BREAKFAST (Patient taking differently: Take 25 mcg by mouth daily before breakfast. TAKE 1 TABLET EVERY DAY BEFORE BREAKFAST) 90 tablet 2  . Melatonin 5 MG CAPS Take 10 mg by mouth at bedtime.    . metoprolol tartrate (LOPRESSOR) 25 MG tablet Take 0.5 tablets (12.5 mg total) by mouth 2 (two) times daily. 30 tablet 3  . Multiple Vitamin (MULTIVITAMIN) tablet Take 1 tablet by mouth daily. Centrum Silver    .  Probiotic Product (PROBIOTIC DAILY PO) Take 1 capsule by mouth daily. Florify    . Propylene Glycol (SYSTANE COMPLETE) 0.6 % SOLN Place 1 drop into both eyes 2 (two) times daily as needed (dry eyes).     . vitamin B-12 (CYANOCOBALAMIN) 1000 MCG tablet Take 1,000 mcg by mouth daily.    Marland Kitchen warfarin (COUMADIN) 4 MG tablet Take 1 to 1.5 tablets by mouth daily as directed by coumadin clinic 45 tablet 3  . zinc gluconate 50 MG tablet Take 50 mg by mouth daily.     No current facility-administered medications on file prior to visit.    Allergies  Allergen Reactions  . Niaspan [Niacin Er] Other (See Comments)    Severe flushing  . Phenazopyridine Hcl Other (See Comments)    Made sick   . Statins     Severe flushing   Social History   Socioeconomic History  . Marital status: Widowed    Spouse name: Not on file  . Number of children: Not on file  . Years of education: Not on file  . Highest education level: Not on file  Occupational History  . Not on file  Social Needs  . Financial resource strain: Not on file  . Food insecurity:    Worry: Not on file    Inability: Not on file  . Transportation needs:    Medical: Not on file    Non-medical: Not on file  Tobacco Use  . Smoking status: Never Smoker  . Smokeless tobacco: Never Used  Substance and Sexual Activity  . Alcohol use: No  . Drug use: No  . Sexual activity: Not on file  Lifestyle  . Physical activity:    Days per week: Not on file    Minutes per session: Not on file  . Stress: Not on file  Relationships  . Social connections:    Talks on phone: Not on file    Gets together: Not on file    Attends religious service: Not on file    Active member of club or organization: Not on file    Attends meetings of clubs or organizations: Not on file    Relationship status: Not on file  . Intimate partner violence:    Fear of current or ex partner: Not on file    Emotionally abused: Not on file    Physically abused: Not on  file    Forced sexual activity: Not on file  Other Topics Concern  . Not on file  Social History Narrative   Lives with her mom.    Takes care of aunt who lives across the street.    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            Review of Systems  All other systems reviewed and are  negative.      Objective:   Physical Exam  HENT:  Mouth/Throat: No oropharyngeal exudate.  Neck: Neck supple. No JVD present. No tracheal deviation present. No thyromegaly present.  Cardiovascular: Normal rate and regular rhythm.  Murmur heard. Pulmonary/Chest: Effort normal and breath sounds normal. No stridor. No respiratory distress. She has no wheezes. She has no rales.  Abdominal: Soft. Bowel sounds are normal.  Musculoskeletal: She exhibits no edema.  Lymphadenopathy:    She has no cervical adenopathy.  Vitals reviewed.         Assessment & Plan:  Chronic cough - Plan: fluticasone (FLONASE) 50 MCG/ACT nasal spray, pantoprazole (PROTONIX) 40 MG tablet, traMADol (ULTRAM) 50 MG tablet, DG Chest 2 View  Upper airway cough syndrome  Patient's exam is unremarkable.  She is therapeutic on Coumadin and therefore I believe it luminary embolism after surgery is unlikely.  Differential diagnosis includes upper airway cough syndrome, chronic bronchitis, laryngo-esophageal reflux, nerve irritation in the throat secondary to intubation causing a chronic cough, infection although unlikely.  Recommended a chest x-ray.  Start the patient on a combination of Flonase 2 sprays each nostril daily for possible postnasal drip coupled with Protonix 40 mg daily for possible silent reflux.  Obtain chest x-ray to rule out infection.  Start tramadol for nerve irritation 50 mg p.o. every 8 hours.  Recheck in 1 week.  If cough persist consider additional work-up or empiric trial of inhaled steroids for possible cough variant asthma

## 2018-04-13 DIAGNOSIS — Z9889 Other specified postprocedural states: Secondary | ICD-10-CM

## 2018-04-13 NOTE — Progress Notes (Signed)
Daily Session Note  Patient Details  Name: Maria Gomez MRN: [id number] Date of Birth: 1945-06-08 Referring Provider:     Cardiac Rehab from 04/11/2018 in [facility] Cardiac and Pulmonary Rehab  Referring Provider  Martinique, Peter MD      Encounter Date: 04/13/2018  Check In: Session Check In - 04/13/18 0755      Check-In   Supervising physician immediately available to respond to emergencies  See telemetry face sheet for immediately available ER MD    Location  [facility]-Cardiac & Pulmonary Rehab    Staff Present  Heath Lark, RN, BSN, CCRP;Jessica [place], MA, RCEP, CCRP, Exercise Physiologist;Aoki Wedemeyer Tessie Fass RCP,RRT,BSRT    Medication changes reported      No    Fall or balance concerns reported     No    Warm-up and Cool-down  Performed on first and last piece of equipment    Resistance Training Performed  Yes    VAD Patient?  No    PAD/SET Patient?  No      Pain Assessment   Currently in Pain?  No/denies          Social History   Tobacco Use  Smoking Status Never Smoker  Smokeless Tobacco Never Used    Goals Met:  Exercise tolerated well Personal goals reviewed No report of cardiac concerns or symptoms Strength training completed today  Goals Unmet:  Not Applicable  Comments: First full day of exercise!  Patient was oriented to gym and equipment including functions, settings, policies, and procedures.  Patient's individual exercise prescription and treatment plan were reviewed.  All starting workloads were established based on the results of the 6 minute walk test done at initial orientation visit.  The plan for exercise progression was also introduced and progression will be customized based on patient's performance and goals.    Dr. Emily Filbert is Medical Director for [place] and [facility].

## 2018-04-15 DIAGNOSIS — Z9889 Other specified postprocedural states: Secondary | ICD-10-CM | POA: Diagnosis not present

## 2018-04-15 NOTE — Progress Notes (Signed)
Daily Session Note  Patient Details  Name: Maria Gomez MRN: [id number] Date of Birth: 08/19/1945 Referring Provider:     Cardiac Rehab from 04/11/2018 in [facility] Cardiac and Pulmonary Rehab  Referring Provider  Martinique, Peter MD      Encounter Date: 04/15/2018  Check In:      Social History   Tobacco Use  Smoking Status Never Smoker  Smokeless Tobacco Never Used    Goals Met:  Independence with exercise equipment Exercise tolerated well No report of cardiac concerns or symptoms Strength training completed today  Goals Unmet:  Not Applicable  Comments: Pt able to follow exercise prescription today without complaint.  Will continue to monitor for progression.    Dr. Emily Filbert is Medical Director for [place] and [facility].

## 2018-04-18 ENCOUNTER — Encounter: Payer: Medicare HMO | Admitting: *Deleted

## 2018-04-18 DIAGNOSIS — Z9889 Other specified postprocedural states: Secondary | ICD-10-CM

## 2018-04-18 NOTE — Progress Notes (Signed)
Daily Session Note  Patient Details  Name: Maria Gomez MRN: [id number] Date of Birth: 1945-10-07 Referring Provider:     Cardiac Rehab from 04/11/2018 in [facility] Cardiac and Pulmonary Rehab  Referring Provider  Martinique, Peter MD      Encounter Date: 04/18/2018  Check In: Session Check In - 04/18/18 0802      Check-In   Supervising physician immediately available to respond to emergencies  See telemetry face sheet for immediately available ER MD    Location  [facility]-Cardiac & Pulmonary Rehab    Staff Present  Heath Lark, RN, BSN, CCRP;Kortland Nichols [place], MA, RCEP, CCRP, Exercise Physiologist;Kelly Amedeo Plenty, BS, ACSM CEP, Exercise Physiologist    Medication changes reported      No    Fall or balance concerns reported     No    Warm-up and Cool-down  Performed as group-led instruction    Resistance Training Performed  Yes    VAD Patient?  No    PAD/SET Patient?  No      Pain Assessment   Currently in Pain?  No/denies          Social History   Tobacco Use  Smoking Status Never Smoker  Smokeless Tobacco Never Used    Goals Met:  Independence with exercise equipment Exercise tolerated well No report of cardiac concerns or symptoms Strength training completed today  Goals Unmet:  Not Applicable  Comments: Pt able to follow exercise prescription today without complaint.  Will continue to monitor for progression.    Dr. Emily Filbert is Medical Director for [place] and [facility].

## 2018-04-20 DIAGNOSIS — Z9889 Other specified postprocedural states: Secondary | ICD-10-CM

## 2018-04-20 NOTE — Progress Notes (Signed)
Daily Session Note  Patient Details  Name: Maria Gomez MRN: [id number] Date of Birth: 02/02/1946 Referring Provider:     Cardiac Rehab from 04/11/2018 in [facility] Cardiac and Pulmonary Rehab  Referring Provider  Martinique, Peter MD      Encounter Date: 04/20/2018  Check In: Session Check In - 04/20/18 0847      Check-In   Supervising physician immediately available to respond to emergencies  See telemetry face sheet for immediately available ER MD    Location  [facility]-Cardiac & Pulmonary Rehab    Staff Present  Carson Myrtle, BS, RRT, Respiratory Therapist;Susanne Bice, RN, BSN, CCRP;Briena Swingler RCP,RRT,BSRT    Medication changes reported      No    Fall or balance concerns reported     No    Warm-up and Cool-down  Performed as group-led Higher education careers adviser Performed  Yes    VAD Patient?  No    PAD/SET Patient?  No      Pain Assessment   Currently in Pain?  No/denies          Social History   Tobacco Use  Smoking Status Never Smoker  Smokeless Tobacco Never Used    Goals Met:  Independence with exercise equipment Exercise tolerated well No report of cardiac concerns or symptoms Strength training completed today  Goals Unmet:  Not Applicable  Comments: Pt able to follow exercise prescription today without complaint.  Will continue to monitor for progression.    Dr. Emily Filbert is Medical Director for [place] and [facility].

## 2018-04-25 ENCOUNTER — Encounter: Payer: Medicare HMO | Attending: Cardiology | Admitting: *Deleted

## 2018-04-25 DIAGNOSIS — Z9889 Other specified postprocedural states: Secondary | ICD-10-CM | POA: Insufficient documentation

## 2018-04-25 NOTE — Progress Notes (Signed)
Daily Session Note  Patient Details  Name: Maria Gomez MRN: [id number] Date of Birth: Dec 09, 1945 Referring Provider:     Cardiac Rehab from 04/11/2018 in [facility] Cardiac and Pulmonary Rehab  Referring Provider  Martinique, Peter MD      Encounter Date: 04/25/2018  Check In: Session Check In - 04/25/18 0757      Check-In   Supervising physician immediately available to respond to emergencies  See telemetry face sheet for immediately available ER MD    Location  [facility]-Cardiac & Pulmonary Rehab    Staff Present  Earlean Shawl, BS, ACSM CEP, Exercise Physiologist;Susanne Bice, RN, BSN, CCRP;Jessica Hawkins, MA, RCEP, CCRP, Exercise Physiologist    Medication changes reported      No    Fall or balance concerns reported     No    Tobacco Cessation  No Change    Warm-up and Cool-down  Performed as group-led instruction    Resistance Training Performed  Yes    VAD Patient?  No    PAD/SET Patient?  No      Pain Assessment   Currently in Pain?  No/denies    Multiple Pain Sites  No          Social History   Tobacco Use  Smoking Status Never Smoker  Smokeless Tobacco Never Used    Goals Met:  Independence with exercise equipment Personal goals reviewed No report of cardiac concerns or symptoms Strength training completed today  Goals Unmet:  Not Applicable  Comments: Pt able to follow exercise prescription today without complaint.  Will continue to monitor for progression.    Dr. Emily Filbert is Medical Director for [place] and [facility].

## 2018-04-26 DIAGNOSIS — H16013 Central corneal ulcer, bilateral: Secondary | ICD-10-CM | POA: Diagnosis not present

## 2018-04-27 ENCOUNTER — Encounter: Payer: Self-pay | Admitting: *Deleted

## 2018-04-27 DIAGNOSIS — B0052 Herpesviral keratitis: Secondary | ICD-10-CM | POA: Diagnosis not present

## 2018-04-27 DIAGNOSIS — Z9889 Other specified postprocedural states: Secondary | ICD-10-CM

## 2018-04-27 NOTE — Progress Notes (Signed)
Cardiac Individual Treatment Plan  Patient Details  Name: Maria Gomez MRN: [id number] Date of Birth: 08/09/1945 Referring Provider:     Cardiac Rehab from 04/11/2018 in [facility]  Referring Provider  Jordan, Peter MD      Initial Encounter Date:    Cardiac Rehab from 04/11/2018 in [facility]  Date  04/11/18      Visit Diagnosis: S/P mitral valve repair  Patient's Home Medications on Admission:  Current Outpatient Medications:  .  acetaminophen (TYLENOL) 500 MG tablet, Take 500 mg by mouth every 8 (eight) hours as needed., Disp: , Rfl:  .  alendronate (FOSAMAX) 70 MG tablet, Take 1 tablet (70 mg total) by mouth every 7 (seven) days. Take with a full glass of water on an empty stomach.Sit upright for 30 minutes after taking, Disp: 12 tablet, Rfl: 3 .  aspirin EC 81 MG EC tablet, Take 1 tablet (81 mg total) by mouth daily., Disp: , Rfl:  .  benzonatate (TESSALON) 200 MG capsule, Take 1 capsule (200 mg total) by mouth 3 (three) times daily as needed for cough., Disp: 30 capsule, Rfl: 0 .  Biotin 5000 MCG TABS, Take 5,000 mcg by mouth daily. , Disp: , Rfl:  .  cetirizine (ZYRTEC) 10 MG tablet, Take 1 tablet (10 mg total) by mouth daily. (Patient taking differently: Take 10 mg by mouth daily as needed for allergies. ), Disp: 90 tablet, Rfl: 0 .  Coenzyme Q10 (CO Q 10 PO), Take 300 mg by mouth daily., Disp: , Rfl:  .  docusate sodium (COLACE) 100 MG capsule, Take 100 mg by mouth daily., Disp: , Rfl:  .  fluticasone (FLONASE) 50 MCG/ACT nasal spray, Place 2 sprays into both nostrils daily., Disp: 16 g, Rfl: 6 .  furosemide (LASIX) 40 MG tablet, Take 1 tablet (40 mg total) by mouth daily., Disp: 7 tablet, Rfl: 0 .  levothyroxine (SYNTHROID, LEVOTHROID) 25 MCG tablet, TAKE 1 TABLET EVERY DAY BEFORE BREAKFAST (Patient taking differently: Take 25 mcg by mouth daily before breakfast. TAKE 1 TABLET EVERY DAY BEFORE BREAKFAST), Disp: 90 tablet,  Rfl: 2 .  Melatonin 5 MG CAPS, Take 10 mg by mouth at bedtime., Disp: , Rfl:  .  metoprolol tartrate (LOPRESSOR) 25 MG tablet, Take 0.5 tablets (12.5 mg total) by mouth 2 (two) times daily., Disp: 30 tablet, Rfl: 3 .  Multiple Vitamin (MULTIVITAMIN) tablet, Take 1 tablet by mouth daily. Centrum Silver, Disp: , Rfl:  .  pantoprazole (PROTONIX) 40 MG tablet, Take 1 tablet (40 mg total) by mouth daily., Disp: 30 tablet, Rfl: 3 .  Probiotic Product (PROBIOTIC DAILY PO), Take 1 capsule by mouth daily. Florify, Disp: , Rfl:  .  Propylene Glycol (SYSTANE COMPLETE) 0.6 % SOLN, Place 1 drop into both eyes 2 (two) times daily as needed (dry eyes). , Disp: , Rfl:  .  traMADol (ULTRAM) 50 MG tablet, Take 1 tablet (50 mg total) by mouth every 8 (eight) hours as needed., Disp: 30 tablet, Rfl: 0 .  vitamin B-12 (CYANOCOBALAMIN) 1000 MCG tablet, Take 1,000 mcg by mouth daily., Disp: , Rfl:  .  warfarin (COUMADIN) 4 MG tablet, Take 1 to 1.5 tablets by mouth daily as directed by coumadin clinic, Disp: 45 tablet, Rfl: 3 .  zinc gluconate 50 MG tablet, Take 50 mg by mouth daily., Disp: , Rfl:   Past Medical History: Past Medical History:  Diagnosis Date  . Hypothyroidism   . Mitral regurgitation   .   Osteopenia   . S/P minimally invasive mitral valve repair 03/10/2018   Complex valvuloplasty including triangular resection of posterior leaflet, artificial Gore-tex neochord x6 and Size 30 Sorin Memo 4D Ring SN [id number] via right mini thoracotomy approach  . Thyroid nodule 03/07/2018   Incidental - noted on CT scan    Tobacco Use: Social History   Tobacco Use  Smoking Status Never Smoker  Smokeless Tobacco Never Used    Labs: Recent Review Flowsheet Data    Labs for ITP Cardiac and Pulmonary Rehab Latest Ref Rng & Units 03/10/2018 03/10/2018 03/10/2018 03/10/2018 03/11/2018   Cholestrol <200 mg/dL - - - - -   LDLCALC mg/dL (calc) - - - - -   HDL >50 mg/dL - - - - -   Trlycerides <150 mg/dL - - - - -    Hemoglobin A1c 4.8 - 5.6 % - - - - -   PHART 7.350 - 7.450 7.423 7.319(L) 7.319(L) - -   PCO2ART 32.0 - 48.0 mmHg 28.5(L) 43.3 37.6 - -   HCO3 20.0 - 28.0 mmol/L 18.9(L) 22.3 19.4(L) - -   TCO2 22 - 32 mmol/L 20(L) 24 20(L) 21(L) 19(L)   ACIDBASEDEF 0.0 - 2.0 mmol/L 5.0(H) 4.0(H) 6.0(H) - -   O2SAT % 98.0 98.0 95.0 - -       Exercise Target Goals: Exercise Program Goal: Individual exercise prescription set using results from initial 6 min walk test and THRR while considering  patient's activity barriers and safety.   Exercise Prescription Goal: Initial exercise prescription builds to 30-45 minutes a day of aerobic activity, 2-3 days per week.  Home exercise guidelines will be given to patient during program as part of exercise prescription that the participant will acknowledge.  Activity Barriers & Risk Stratification: Activity Barriers & Cardiac Risk Stratification - 04/11/18 1143      Activity Barriers & Cardiac Risk Stratification   Activity Barriers  None    Cardiac Risk Stratification  Low       6 Minute Walk: 6 Minute Walk    Row Name 04/11/18 1236         6 Minute Walk   Phase  Initial     Distance  1264 feet     Walk Time  6 minutes     # of Rest Breaks  0     MPH  2.39     METS  2.93     RPE  12     VO2 Peak  10.26     Symptoms  No     Resting HR  101 bpm     Resting BP  122/62     Resting Oxygen Saturation   97 %     Exercise Oxygen Saturation  during 6 min walk  100 %     Max Ex. HR  131 bpm     Max Ex. BP  134/68     2 Minute Post BP  124/62        Oxygen Initial Assessment:   Oxygen Re-Evaluation:   Oxygen Discharge (Final Oxygen Re-Evaluation):   Initial Exercise Prescription: Initial Exercise Prescription - 04/11/18 1200      Date of Initial Exercise RX and Referring Provider   Date  04/11/18    Referring Provider  Jordan, Peter MD      Treadmill   MPH  2    Grade  0.5    Minutes  15    METs  2.67        Recumbant Bike   Level   1    RPM  50    Watts  21    Minutes  15    METs  2.6      NuStep   Level  1    SPM  80    Minutes  15    METs  2      Prescription Details   Frequency (times per week)  3    Duration  Progress to 45 minutes of aerobic exercise without signs/symptoms of physical distress      Intensity   THRR 40-80% of Max Heartrate  120-139    Ratings of Perceived Exertion  11-13    Perceived Dyspnea  0-4      Progression   Progression  Continue to progress workloads to maintain intensity without signs/symptoms of physical distress.      Resistance Training   Training Prescription  Yes    Weight  3 lbs    Reps  10-15       Perform Capillary Blood Glucose checks as needed.  Exercise Prescription Changes: Exercise Prescription Changes    Row Name 04/11/18 1200 04/18/18 1500           Response to Exercise   Blood Pressure (Admit)  122/62  102/62      Blood Pressure (Exercise)  134/68  128/74      Blood Pressure (Exit)  124/62  106/56      Heart Rate (Admit)  101 bpm  99 bpm      Heart Rate (Exercise)  131 bpm  111 bpm      Heart Rate (Exit)  110 bpm  95 bpm      Oxygen Saturation (Admit)  97 %  -      Oxygen Saturation (Exercise)  100 %  -      Rating of Perceived Exertion (Exercise)  12  12      Symptoms  none  none      Comments  walk test results  -      Duration  -  Progress to 45 minutes of aerobic exercise without signs/symptoms of physical distress      Intensity  -  THRR unchanged        Progression   Progression  -  Continue to progress workloads to maintain intensity without signs/symptoms of physical distress.      Average METs  -  2.44        Resistance Training   Training Prescription  -  Yes      Weight  -  3 lbs      Reps  -  10-15        Interval Training   Interval Training  -  No        Treadmill   MPH  -  2      Grade  -  0.5      Minutes  -  15      METs  -  2.67        Recumbant Bike   Level  -  1      Watts  -  16      Minutes  -  15       METs  -  2.67        NuStep   Level  -  1      Minutes  -  15      METs  -    2         Exercise Comments: Exercise Comments    Row Name 04/13/18 0756           Exercise Comments  First full day of exercise!  Patient was oriented to gym and equipment including functions, settings, policies, and procedures.  Patient's individual exercise prescription and treatment plan were reviewed.  All starting workloads were established based on the results of the 6 minute walk test done at initial orientation visit.  The plan for exercise progression was also introduced and progression will be customized based on patient's performance and goals.          Exercise Goals and Review: Exercise Goals    Row Name 04/11/18 1244             Exercise Goals   Increase Physical Activity  Yes       Intervention  Provide advice, education, support and counseling about physical activity/exercise needs.;Develop an individualized exercise prescription for aerobic and resistive training based on initial evaluation findings, risk stratification, comorbidities and participant's personal goals.       Expected Outcomes  Short Term: Attend rehab on a regular basis to increase amount of physical activity.;Long Term: Add in home exercise to make exercise part of routine and to increase amount of physical activity.;Long Term: Exercising regularly at least 3-5 days a week.       Increase Strength and Stamina  Yes       Intervention  Provide advice, education, support and counseling about physical activity/exercise needs.;Develop an individualized exercise prescription for aerobic and resistive training based on initial evaluation findings, risk stratification, comorbidities and participant's personal goals.       Expected Outcomes  Short Term: Increase workloads from initial exercise prescription for resistance, speed, and METs.;Short Term: Perform resistance training exercises routinely during rehab and add in  resistance training at home;Long Term: Improve cardiorespiratory fitness, muscular endurance and strength as measured by increased METs and functional capacity (6MWT)       Able to understand and use rate of perceived exertion (RPE) scale  Yes       Intervention  Provide education and explanation on how to use RPE scale       Expected Outcomes  Short Term: Able to use RPE daily in rehab to express subjective intensity level;Long Term:  Able to use RPE to guide intensity level when exercising independently       Knowledge and understanding of Target Heart Rate Range (THRR)  Yes       Intervention  Provide education and explanation of THRR including how the numbers were predicted and where they are located for reference       Expected Outcomes  Short Term: Able to state/look up THRR;Short Term: Able to use daily as guideline for intensity in rehab;Long Term: Able to use THRR to govern intensity when exercising independently       Able to check pulse independently  Yes       Intervention  Provide education and demonstration on how to check pulse in carotid and radial arteries.;Review the importance of being able to check your own pulse for safety during independent exercise       Expected Outcomes  Short Term: Able to explain why pulse checking is important during independent exercise;Long Term: Able to check pulse independently and accurately       Understanding of Exercise Prescription  Yes       Intervention  Provide education, explanation, and  written materials on patient's individual exercise prescription       Expected Outcomes  Short Term: Able to explain program exercise prescription;Long Term: Able to explain home exercise prescription to exercise independently          Exercise Goals Re-Evaluation : Exercise Goals Re-Evaluation    Row Name 04/13/18 0756 04/18/18 1535           Exercise Goal Re-Evaluation   Exercise Goals Review  Able to understand and use rate of perceived exertion  (RPE) scale;Knowledge and understanding of Target Heart Rate Range (THRR);Understanding of Exercise Prescription  Increase Physical Activity;Increase Strength and Stamina;Understanding of Exercise Prescription      Comments  Reviewed RPE scale, THR and program prescription with pt today.  Pt voiced understanding and was given a copy of goals to take home.   Maria Gomez is off to a good start in rehab.  She is doing well with her baseline workloads and should be ready to start to move up next week. We will continue to monitor her progres.       Expected Outcomes  Short: Use RPE daily to regulate intensity. Long: Follow program prescription in THR.  Short: Review home exercise guidelines.  Long: Continue to follow program prescription.          Discharge Exercise Prescription (Final Exercise Prescription Changes): Exercise Prescription Changes - 04/18/18 1500      Response to Exercise   Blood Pressure (Admit)  102/62    Blood Pressure (Exercise)  128/74    Blood Pressure (Exit)  106/56    Heart Rate (Admit)  99 bpm    Heart Rate (Exercise)  111 bpm    Heart Rate (Exit)  95 bpm    Rating of Perceived Exertion (Exercise)  12    Symptoms  none    Duration  Progress to 45 minutes of aerobic exercise without signs/symptoms of physical distress    Intensity  THRR unchanged      Progression   Progression  Continue to progress workloads to maintain intensity without signs/symptoms of physical distress.    Average METs  2.44      Resistance Training   Training Prescription  Yes    Weight  3 lbs    Reps  10-15      Interval Training   Interval Training  No      Treadmill   MPH  2    Grade  0.5    Minutes  15    METs  2.67      Recumbant Bike   Level  1    Watts  16    Minutes  15    METs  2.67      NuStep   Level  1    Minutes  15    METs  2       Nutrition:  Target Goals: Understanding of nutrition guidelines, daily intake of sodium <1500mg, cholesterol <200mg, calories 30% from fat  and 7% or less from saturated fats, daily to have 5 or more servings of fruits and vegetables.  Biometrics: Pre Biometrics - 04/11/18 1245      Pre Biometrics   Height  5' 2.5" (1.588 m)    Weight  157 lb 1.6 oz (71.3 kg)    Waist Circumference  33 inches    Hip Circumference  41 inches    Waist to Hip Ratio  0.8 %    BMI (Calculated)  28.26    Single Leg   Stand  4.48 seconds        Nutrition Therapy Plan and Nutrition Goals: Nutrition Therapy & Goals - 04/13/18 0934      Nutrition Therapy   Diet  TLC    Drug/Food Interactions  Coumadin/Vit K    Protein (specify units)  6-7oz    Fiber  20 grams    Whole Grain Foods  3 servings   does not always choose whole grains   Saturated Fats  12 max. grams    Fruits and Vegetables  5 servings/day   8 ideal, eats fruits and vegetables daily but does not always eat 3 meals/day   Sodium  1500 grams      Personal Nutrition Goals   Nutrition Goal  Start to read food labels to identify items high in sodium, such as canned soups. Choose lower sodium varietites when possible    Personal Goal #2  Use salt free herbs and seasonings to add flavor variety to meals without adding salt    Comments  She has not added much salt to meals for "years" and typically chooses fruits as snacks. Breakfast: eggs, toast, cheese toast, fruit. Lunch: largest meal of the day, steamed vegetables + a meat (chicken, occasionally red meat, pork chop, fish, shrimp), Dinner: light meal of leftovers typically. Beverages: water, recently has been drinking more diluted juice. She has been known to skip dinner      Intervention Plan   Intervention  Prescribe, educate and counsel regarding individualized specific dietary modifications aiming towards targeted core components such as weight, hypertension, lipid management, diabetes, heart failure and other comorbidities.;Nutrition handout(s) given to patient.   Coumadin nutrition therapy/ foods high in Vitamin K   Expected  Outcomes  Short Term Goal: Understand basic principles of dietary content, such as calories, fat, sodium, cholesterol and nutrients.;Short Term Goal: A plan has been developed with personal nutrition goals set during dietitian appointment.;Long Term Goal: Adherence to prescribed nutrition plan.       Nutrition Assessments: Nutrition Assessments - 04/11/18 1033      MEDFICTS Scores   Pre Score  18       Nutrition Goals Re-Evaluation: Nutrition Goals Re-Evaluation    Row Name 04/13/18 0950             Goals   Nutrition Goal  Start to read food labels to identify items high in sodium, such as canned soups. Choose lower sodium varietites when possible       Comment  She does not add salt to many meals at home but does not typically read food labels to identify sodium content       Expected Outcome  She will start to read food labels consistently and choose items that are lower in sodium         Personal Goal #2 Re-Evaluation   Personal Goal #2  Use salt free herbs and seasonings to add flavor variety to meals without adding salt          Nutrition Goals Discharge (Final Nutrition Goals Re-Evaluation): Nutrition Goals Re-Evaluation - 04/13/18 0950      Goals   Nutrition Goal  Start to read food labels to identify items high in sodium, such as canned soups. Choose lower sodium varietites when possible    Comment  She does not add salt to many meals at home but does not typically read food labels to identify sodium content    Expected Outcome  She will start to read food labels consistently   and choose items that are lower in sodium      Personal Goal #2 Re-Evaluation   Personal Goal #2  Use salt free herbs and seasonings to add flavor variety to meals without adding salt       Psychosocial: Target Goals: Acknowledge presence or absence of significant depression and/or stress, maximize coping skills, provide positive support system. Participant is able to verbalize types and  ability to use techniques and skills needed for reducing stress and depression.   Initial Review & Psychosocial Screening: Initial Psych Review & Screening - 04/11/18 1141      Initial Review   Current issues with  Current Sleep Concerns;Current Stress Concerns    Comments  Maria Gomez lives with her 92 year old mother. She helps care for her and shared that it can be stressful at times. She feels like she has a good support system with her son and friends. She lost her husband 22 years ago and reports that she has never slept the same since. Her surgical discomfort has made sleeping more of an issue lately, but said its getting better.       Family Dynamics   Good Support System?  Yes   son, daughter-in-law, friends     Barriers   Psychosocial barriers to participate in program  There are no identifiable barriers or psychosocial needs.;The patient should benefit from training in stress management and relaxation.      Screening Interventions   Interventions  Encouraged to exercise;Program counselor consult;To provide support and resources with identified psychosocial needs;Provide feedback about the scores to participant    Expected Outcomes  Short Term goal: Utilizing psychosocial counselor, staff and physician to assist with identification of specific Stressors or current issues interfering with healing process. Setting desired goal for each stressor or current issue identified.;Long Term Goal: Stressors or current issues are controlled or eliminated.;Short Term goal: Identification and review with participant of any Quality of Life or Depression concerns found by scoring the questionnaire.;Long Term goal: The participant improves quality of Life and PHQ9 Scores as seen by post scores and/or verbalization of changes       Quality of Life Scores:  Quality of Life - 04/11/18 1032      Quality of Life   Select  Quality of Life      Quality of Life Scores   Health/Function Pre  26.31 %     Socioeconomic Pre  30 %    Psych/Spiritual Pre  29.14 %    Family Pre  30 %    GLOBAL Pre  28.14 %      Scores of 19 and below usually indicate a poorer quality of life in these areas.  A difference of  2-3 points is a clinically meaningful difference.  A difference of 2-3 points in the total score of the Quality of Life Index has been associated with significant improvement in overall quality of life, self-image, physical symptoms, and general health in studies assessing change in quality of life.  PHQ-9: Recent Review Flowsheet Data    Depression screen PHQ 2/9 04/11/2018 04/04/2018 08/23/2017 05/10/2017 08/17/2016   Decreased Interest 0 0 0 0 0   Down, Depressed, Hopeless 0 0 0 0 0   PHQ - 2 Score 0 0 0 0 0   Altered sleeping 1 - - - 0   Tired, decreased energy 1 - - - 0   Change in appetite 0 - - - 0   Feeling bad or failure about yourself    0 - - - 0   Trouble concentrating 0 - - - 0   Moving slowly or fidgety/restless 0 - - - 0   Suicidal thoughts 0 - - - 0   PHQ-9 Score 2 - - - 0   Difficult doing work/chores Not difficult at all - - - Not difficult at all     Interpretation of Total Score  Total Score Depression Severity:  1-4 = Minimal depression, 5-9 = Mild depression, 10-14 = Moderate depression, 15-19 = Moderately severe depression, 20-27 = Severe depression   Psychosocial Evaluation and Intervention: Psychosocial Evaluation - 04/13/18 0928      Psychosocial Evaluation & Interventions   Interventions  Stress management education;Relaxation education;Encouraged to exercise with the program and follow exercise prescription    Comments  Counselor met with Ms. Haydon (Maria Gomez) today for initial psychsocial evaluation.  She is a 72 year old who had a mitral valve replaced on 10/17.  Maria Gomez has a strong support system as she lives with her 92 year old mother; has a son and daughter-in-law living close by; a close friend and active involvement in her local church.  Maria Gomez reports having  osteoporosis in addition to her heart condition that she struggles with.  She sleeps "okay" with ~6-7 hours/night with the help of melatonin and her appetite has been less since the surgery.  Maria Gomez reports having a history of depression after her spouse passed away 22 years ago and was on medication for awhile - but no current symptoms or medication for this.   She reports having some anxiety currently with caring for her 92 year old mother "who is stubborn".  Maria Gomez's mood is stable and her primary stressors are her own health and caring for her mother.  She has goals to increase her strength and improve her balance as well as learn the "secrets of healthy eating and stick to it!"  Staff will follow with her throughout the course of this program.      Expected Outcomes  Short:  Maria Gomez will exercise for her health and as a stress reducer.  She will meet with the dietician to address her healthy eating goals and attend the nutrition education as well as the stress management education.  Long:  Maria Gomez will develop a regimen of consistent exercise and diet for her health and mental health.      Continue Psychosocial Services   Follow up required by staff       Psychosocial Re-Evaluation:   Psychosocial Discharge (Final Psychosocial Re-Evaluation):   Vocational Rehabilitation: Provide vocational rehab assistance to qualifying candidates.   Vocational Rehab Evaluation & Intervention: Vocational Rehab - 04/11/18 1033      Initial Vocational Rehab Evaluation & Intervention   Assessment shows need for Vocational Rehabilitation  No       Education: Education Goals: Education classes will be provided on a variety of topics geared toward better understanding of heart health and risk factor modification. Participant will state understanding/return demonstration of topics presented as noted by education test scores.  Learning Barriers/Preferences: Learning Barriers/Preferences - 04/11/18 1141      Learning  Barriers/Preferences   Learning Barriers  None    Learning Preferences  None       Education Topics:  AED/CPR: - Group verbal and written instruction with the use of models to demonstrate the basic use of the AED with the basic ABC's of resuscitation.   General Nutrition Guidelines/Fats and Fiber: -Group instruction provided by verbal, written material, models   and posters to present the general guidelines for heart healthy nutrition. Gives an explanation and review of dietary fats and fiber.   Controlling Sodium/Reading Food Labels: -Group verbal and written material supporting the discussion of sodium use in heart healthy nutrition. Review and explanation with models, verbal and written materials for utilization of the food label.   Exercise Physiology & General Exercise Guidelines: - Group verbal and written instruction with models to review the exercise physiology of the cardiovascular system and associated critical values. Provides general exercise guidelines with specific guidelines to those with heart or lung disease.    Aerobic Exercise & Resistance Training: - Gives group verbal and written instruction on the various components of exercise. Focuses on aerobic and resistive training programs and the benefits of this training and how to safely progress through these programs..   Cardiac Rehab from 04/25/2018 in [facility] Cardiac and Pulmonary Rehab  Date  04/13/18  Educator  [facility]  Instruction Review Code  1- Verbalizes Understanding      Flexibility, Balance, Mind/Body Relaxation: Provides group verbal/written instruction on the benefits of flexibility and balance training, including mind/body exercise modes such as yoga, pilates and tai chi.  Demonstration and skill practice provided.   Cardiac Rehab from 04/25/2018 in [facility] Cardiac and Pulmonary Rehab  Date  04/18/18  Educator  [facility]  Instruction Review Code  1- Verbalizes Understanding      Stress and Anxiety: - Provides group  verbal and written instruction about the health risks of elevated stress and causes of high stress.  Discuss the correlation between heart/lung disease and anxiety and treatment options. Review healthy ways to manage with stress and anxiety.   Depression: - Provides group verbal and written instruction on the correlation between heart/lung disease and depressed mood, treatment options, and the stigmas associated with seeking treatment.   Anatomy & Physiology of the Heart: - Group verbal and written instruction and models provide basic cardiac anatomy and physiology, with the coronary electrical and arterial systems. Review of Valvular disease and Heart Failure   Cardiac Procedures: - Group verbal and written instruction to review commonly prescribed medications for heart disease. Reviews the medication, class of the drug, and side effects. Includes the steps to properly store meds and maintain the prescription regimen. (beta blockers and nitrates)   Cardiac Medications I: - Group verbal and written instruction to review commonly prescribed medications for heart disease. Reviews the medication, class of the drug, and side effects. Includes the steps to properly store meds and maintain the prescription regimen.   Cardiac Medications II: -Group verbal and written instruction to review commonly prescribed medications for heart disease. Reviews the medication, class of the drug, and side effects. (all other drug classes)   Cardiac Rehab from 04/25/2018 in [facility] Cardiac and Pulmonary Rehab  Date  04/25/18  Educator  [facility]  Instruction Review Code  1- Verbalizes Understanding       Go Sex-Intimacy & Heart Disease, Get SMART - Goal Setting: - Group verbal and written instruction through game format to discuss heart disease and the return to sexual intimacy. Provides group verbal and written material to discuss and apply goal setting through the application of the S.M.A.R.T. Method.   Other  Matters of the Heart: - Provides group verbal, written materials and models to describe Stable Angina and Peripheral Artery. Includes description of the disease process and treatment options available to the cardiac patient.   Exercise & Equipment Safety: - Individual verbal instruction and demonstration of equipment use  and safety with use of the equipment.   Cardiac Rehab from 04/25/2018 in [facility] Cardiac and Pulmonary Rehab  Date  04/11/18  Educator  [facility]  Instruction Review Code  1- Verbalizes Understanding      Infection Prevention: - Provides verbal and written material to individual with discussion of infection control including proper hand washing and proper equipment cleaning during exercise session.   Cardiac Rehab from 04/25/2018 in [facility] Cardiac and Pulmonary Rehab  Date  04/11/18  Educator  [facility]  Instruction Review Code  1- Verbalizes Understanding      Falls Prevention: - Provides verbal and written material to individual with discussion of falls prevention and safety.   Cardiac Rehab from 04/25/2018 in [facility] Cardiac and Pulmonary Rehab  Date  04/11/18  Educator  [facility]  Instruction Review Code  1- Verbalizes Understanding      Diabetes: - Individual verbal and written instruction to review signs/symptoms of diabetes, desired ranges of glucose level fasting, after meals and with exercise. Acknowledge that pre and post exercise glucose checks will be done for 3 sessions at entry of program.   Know Your Numbers and Risk Factors: -Group verbal and written instruction about important numbers in your health.  Discussion of what are risk factors and how they play a role in the disease process.  Review of Cholesterol, Blood Pressure, Diabetes, and BMI and the role they play in your overall health.   Cardiac Rehab from 04/25/2018 in [facility] Cardiac and Pulmonary Rehab  Date  04/25/18  Educator  [facility]  Instruction Review Code  1- Verbalizes Understanding      Sleep Hygiene: -Provides  group verbal and written instruction about how sleep can affect your health.  Define sleep hygiene, discuss sleep cycles and impact of sleep habits. Review good sleep hygiene tips.    Other: -Provides group and verbal instruction on various topics (see comments)   Knowledge Questionnaire Score: Knowledge Questionnaire Score - 04/11/18 1032      Knowledge Questionnaire Score   Pre Score  24/26   correct answers reviewed with Maria Gomez; focus on stress and angina      Core Components/Risk Factors/Patient Goals at Admission: Personal Goals and Risk Factors at Admission - 04/11/18 1140      Core Components/Risk Factors/Patient Goals on Admission    Weight Management  Yes;Weight Loss    Intervention  Weight Management: Develop a combined nutrition and exercise program designed to reach desired caloric intake, while maintaining appropriate intake of nutrient and fiber, sodium and fats, and appropriate energy expenditure required for the weight goal.;Weight Management: Provide education and appropriate resources to help participant work on and attain dietary goals.    Admit Weight  157 lb 1.6 oz (71.3 kg)    Goal Weight: Short Term  153 lb (69.4 kg)    Goal Weight: Long Term  145 lb (65.8 kg)    Expected Outcomes  Short Term: Continue to assess and modify interventions until short term weight is achieved;Long Term: Adherence to nutrition and physical activity/exercise program aimed toward attainment of established weight goal;Weight Loss: Understanding of general recommendations for a balanced deficit meal plan, which promotes 1-2 lb weight loss per week and includes a negative energy balance of 500-1000 kcal/d;Understanding recommendations for meals to include 15-35% energy as protein, 25-35% energy from fat, 35-60% energy from carbohydrates, less than 200mg of dietary cholesterol, 20-35 gm of total fiber daily;Understanding of distribution of calorie intake throughout the day with the consumption of  4-5 meals/snacks         Core Components/Risk Factors/Patient Goals Review:    Core Components/Risk Factors/Patient Goals at Discharge (Final Review):    ITP Comments: ITP Comments    Row Name 04/11/18 1131 04/27/18 0550         ITP Comments  Med Review completed. Initial ITP created. Diagnosis can be found in [facility] Encounter 10/17  30 day review. Continue with ITP unless direccted changes per Medical Director Chart Review. New to program         Comments:  

## 2018-04-28 DIAGNOSIS — H16203 Unspecified keratoconjunctivitis, bilateral: Secondary | ICD-10-CM | POA: Diagnosis not present

## 2018-04-29 ENCOUNTER — Encounter: Payer: Self-pay | Admitting: *Deleted

## 2018-04-29 DIAGNOSIS — Z9889 Other specified postprocedural states: Secondary | ICD-10-CM

## 2018-04-29 NOTE — Progress Notes (Signed)
Discharge Progress Report  Patient Details  Name: Maria Gomez MRN: [id number] Date of Birth: 12/09/1945 Referring Provider:     Cardiac Rehab from 04/11/2018 in [facility] Cardiac and Pulmonary Rehab  Referring Provider  Martinique, Peter MD       Number of Visits: 6  Reason for Discharge:  Early Exit:  Medical reason  Smoking History:  Social History   Tobacco Use  Smoking Status Never Smoker  Smokeless Tobacco Never Used    Diagnosis:  S/P mitral valve repair  ADL UCSD:   Initial Exercise Prescription: Initial Exercise Prescription - 04/11/18 1200      Date of Initial Exercise RX and Referring Provider   Date  04/11/18    Referring Provider  Martinique, Peter MD      Treadmill   MPH  2    Grade  0.5    Minutes  15    METs  2.67      Recumbant Bike   Level  1    RPM  50    Watts  21    Minutes  15    METs  2.6      NuStep   Level  1    SPM  80    Minutes  15    METs  2      Prescription Details   Frequency (times per week)  3    Duration  Progress to 45 minutes of aerobic exercise without signs/symptoms of physical distress      Intensity   THRR 40-80% of Max Heartrate  120-139    Ratings of Perceived Exertion  11-13    Perceived Dyspnea  0-4      Progression   Progression  Continue to progress workloads to maintain intensity without signs/symptoms of physical distress.      Resistance Training   Training Prescription  Yes    Weight  3 lbs    Reps  10-15       Discharge Exercise Prescription (Final Exercise Prescription Changes): Exercise Prescription Changes - 04/18/18 1500      Response to Exercise   Blood Pressure (Admit)  102/62    Blood Pressure (Exercise)  128/74    Blood Pressure (Exit)  106/56    Heart Rate (Admit)  99 bpm    Heart Rate (Exercise)  111 bpm    Heart Rate (Exit)  95 bpm    Rating of Perceived Exertion (Exercise)  12    Symptoms  none    Duration  Progress to 45 minutes of aerobic exercise without signs/symptoms  of physical distress    Intensity  THRR unchanged      Progression   Progression  Continue to progress workloads to maintain intensity without signs/symptoms of physical distress.    Average METs  2.44      Resistance Training   Training Prescription  Yes    Weight  3 lbs    Reps  10-15      Interval Training   Interval Training  No      Treadmill   MPH  2    Grade  0.5    Minutes  15    METs  2.67      Recumbant Bike   Level  1    Watts  16    Minutes  15    METs  2.67      NuStep   Level  1    Minutes  15  METs  2       Functional Capacity: 6 Minute Walk    Row Name 04/11/18 1236         6 Minute Walk   Phase  Initial     Distance  1264 feet     Walk Time  6 minutes     # of Rest Breaks  0     MPH  2.39     METS  2.93     RPE  12     VO2 Peak  10.26     Symptoms  No     Resting HR  101 bpm     Resting BP  122/62     Resting Oxygen Saturation   97 %     Exercise Oxygen Saturation  during 6 min walk  100 %     Max Ex. HR  131 bpm     Max Ex. BP  134/68     2 Minute Post BP  124/62        Psychological, QOL, Others - Outcomes: PHQ 2/9: Depression screen [facility] 2/9 04/11/2018 04/04/2018 08/23/2017 05/10/2017 08/17/2016  Decreased Interest 0 0 0 0 0  Down, Depressed, Hopeless 0 0 0 0 0  PHQ - 2 Score 0 0 0 0 0  Altered sleeping 1 - - - 0  Tired, decreased energy 1 - - - 0  Change in appetite 0 - - - 0  Feeling bad or failure about yourself  0 - - - 0  Trouble concentrating 0 - - - 0  Moving slowly or fidgety/restless 0 - - - 0  Suicidal thoughts 0 - - - 0  PHQ-9 Score 2 - - - 0  Difficult doing work/chores Not difficult at all - - - Not difficult at all    Quality of Life: Quality of Life - 04/11/18 1032      Quality of Life   Select  Quality of Life      Quality of Life Scores   Health/Function Pre  26.31 %    Socioeconomic Pre  30 %    Psych/Spiritual Pre  29.14 %    Family Pre  30 %    GLOBAL Pre  28.14 %       Personal  Goals: Goals established at orientation with interventions provided to work toward goal. Personal Goals and Risk Factors at Admission - 04/11/18 1140      Core Components/Risk Factors/Patient Goals on Admission    Weight Management  Yes;Weight Loss    Intervention  Weight Management: Develop a combined nutrition and exercise program designed to reach desired caloric intake, while maintaining appropriate intake of nutrient and fiber, sodium and fats, and appropriate energy expenditure required for the weight goal.;Weight Management: Provide education and appropriate resources to help participant work on and attain dietary goals.    Admit Weight  157 lb 1.6 oz (71.3 kg)    Goal Weight: Short Term  153 lb (69.4 kg)    Goal Weight: Long Term  145 lb (65.8 kg)    Expected Outcomes  Short Term: Continue to assess and modify interventions until short term weight is achieved;Long Term: Adherence to nutrition and physical activity/exercise program aimed toward attainment of established weight goal;Weight Loss: Understanding of general recommendations for a balanced deficit meal plan, which promotes 1-2 lb weight loss per week and includes a negative energy balance of [phone] kcal/d;Understanding recommendations for meals to include 15-35% energy as protein, 25-35% energy from  fat, 35-60% energy from carbohydrates, less than 200mg  of dietary cholesterol, 20-35 gm of total fiber daily;Understanding of distribution of calorie intake throughout the day with the consumption of 4-5 meals/snacks        Personal Goals Discharge:   Exercise Goals and Review: Exercise Goals    Row Name 04/11/18 1244             Exercise Goals   Increase Physical Activity  Yes       Intervention  Provide advice, education, support and counseling about physical activity/exercise needs.;Develop an individualized exercise prescription for aerobic and resistive training based on initial evaluation findings, risk stratification,  comorbidities and participant's personal goals.       Expected Outcomes  Short Term: Attend rehab on a regular basis to increase amount of physical activity.;Long Term: Add in home exercise to make exercise part of routine and to increase amount of physical activity.;Long Term: Exercising regularly at least 3-5 days a week.       Increase Strength and Stamina  Yes       Intervention  Provide advice, education, support and counseling about physical activity/exercise needs.;Develop an individualized exercise prescription for aerobic and resistive training based on initial evaluation findings, risk stratification, comorbidities and participant's personal goals.       Expected Outcomes  Short Term: Increase workloads from initial exercise prescription for resistance, speed, and METs.;Short Term: Perform resistance training exercises routinely during rehab and add in resistance training at home;Long Term: Improve cardiorespiratory fitness, muscular endurance and strength as measured by increased METs and functional capacity (6MWT)       Able to understand and use rate of perceived exertion (RPE) scale  Yes       Intervention  Provide education and explanation on how to use RPE scale       Expected Outcomes  Short Term: Able to use RPE daily in rehab to express subjective intensity level;Long Term:  Able to use RPE to guide intensity level when exercising independently       Knowledge and understanding of Target Heart Rate Range (THRR)  Yes       Intervention  Provide education and explanation of THRR including how the numbers were predicted and where they are located for reference       Expected Outcomes  Short Term: Able to state/look up THRR;Short Term: Able to use daily as guideline for intensity in rehab;Long Term: Able to use THRR to govern intensity when exercising independently       Able to check pulse independently  Yes       Intervention  Provide education and demonstration on how to check pulse in  carotid and radial arteries.;Review the importance of being able to check your own pulse for safety during independent exercise       Expected Outcomes  Short Term: Able to explain why pulse checking is important during independent exercise;Long Term: Able to check pulse independently and accurately       Understanding of Exercise Prescription  Yes       Intervention  Provide education, explanation, and written materials on patient's individual exercise prescription       Expected Outcomes  Short Term: Able to explain program exercise prescription;Long Term: Able to explain home exercise prescription to exercise independently          Exercise Goals Re-Evaluation: Exercise Goals Re-Evaluation    Row Name 04/13/18 0756 04/18/18 1535  Exercise Goal Re-Evaluation   Exercise Goals Review  Able to understand and use rate of perceived exertion (RPE) scale;Knowledge and understanding of Target Heart Rate Range (THRR);Understanding of Exercise Prescription  Increase Physical Activity;Increase Strength and Stamina;Understanding of Exercise Prescription      Comments  Reviewed RPE scale, THR and program prescription with pt today.  Pt voiced understanding and was given a copy of goals to take home.   Fraser Din is off to a good start in rehab.  She is doing well with her baseline workloads and should be ready to start to move up next week. We will continue to monitor her progres.       Expected Outcomes  Short: Use RPE daily to regulate intensity. Long: Follow program prescription in THR.  Short: Review home exercise guidelines.  Long: Continue to follow program prescription.          Nutrition & Weight - Outcomes: Pre Biometrics - 04/11/18 1245      Pre Biometrics   Height  5' 2.5" (1.588 m)    Weight  157 lb 1.6 oz (71.3 kg)    Waist Circumference  33 inches    Hip Circumference  41 inches    Waist to Hip Ratio  0.8 %    BMI (Calculated)  28.26    Single Leg Stand  4.48 seconds         Nutrition: Nutrition Therapy & Goals - 04/13/18 0934      Nutrition Therapy   Diet  TLC    Drug/Food Interactions  Coumadin/Vit K    Protein (specify units)  6-7oz    Fiber  20 grams    Whole Grain Foods  3 servings   does not always choose whole grains   Saturated Fats  12 max. grams    Fruits and Vegetables  5 servings/day   8 ideal, eats fruits and vegetables daily but does not always eat 3 meals/day   Sodium  1500 grams      Personal Nutrition Goals   Nutrition Goal  Start to read food labels to identify items high in sodium, such as canned soups. Choose lower sodium varietites when possible    Personal Goal #2  Use salt free herbs and seasonings to add flavor variety to meals without adding salt    Comments  She has not added much salt to meals for "years" and typically chooses fruits as snacks. Breakfast: eggs, toast, cheese toast, fruit. Lunch: largest meal of the day, steamed vegetables + a meat (chicken, occasionally red meat, pork chop, fish, shrimp), Dinner: light meal of leftovers typically. Beverages: water, recently has been drinking more diluted juice. She has been known to skip dinner      Intervention Plan   Intervention  Prescribe, educate and counsel regarding individualized specific dietary modifications aiming towards targeted core components such as weight, hypertension, lipid management, diabetes, heart failure and other comorbidities.;Nutrition handout(s) given to patient.   Coumadin nutrition therapy/ foods high in Vitamin K   Expected Outcomes  Short Term Goal: Understand basic principles of dietary content, such as calories, fat, sodium, cholesterol and nutrients.;Short Term Goal: A plan has been developed with personal nutrition goals set during dietitian appointment.;Long Term Goal: Adherence to prescribed nutrition plan.       Nutrition Discharge: Nutrition Assessments - 04/11/18 1033      MEDFICTS Scores   Pre Score  18       Education  Questionnaire Score: Knowledge Questionnaire Score - 04/11/18 1032  Knowledge Questionnaire Score   Pre Score  24/26   correct answers reviewed with Fraser Din; focus on stress and angina      Goals reviewed with patient; copy given to patient.

## 2018-04-29 NOTE — Progress Notes (Signed)
Cardiac Individual Treatment Plan  Patient Details  Name: Maria Gomez MRN: [id number] Date of Birth: 02/05/1946 Referring Provider:     Cardiac Rehab from 04/11/2018 in [facility]  Referring Provider  Jordan, Peter MD      Initial Encounter Date:    Cardiac Rehab from 04/11/2018 in [facility]  Date  04/11/18      Visit Diagnosis: S/P mitral valve repair  Patient's Home Medications on Admission:  Current Outpatient Medications:  .  acetaminophen (TYLENOL) 500 MG tablet, Take 500 mg by mouth every 8 (eight) hours as needed., Disp: , Rfl:  .  alendronate (FOSAMAX) 70 MG tablet, Take 1 tablet (70 mg total) by mouth every 7 (seven) days. Take with a full glass of water on an empty stomach.Sit upright for 30 minutes after taking, Disp: 12 tablet, Rfl: 3 .  aspirin EC 81 MG EC tablet, Take 1 tablet (81 mg total) by mouth daily., Disp: , Rfl:  .  benzonatate (TESSALON) 200 MG capsule, Take 1 capsule (200 mg total) by mouth 3 (three) times daily as needed for cough., Disp: 30 capsule, Rfl: 0 .  Biotin 5000 MCG TABS, Take 5,000 mcg by mouth daily. , Disp: , Rfl:  .  cetirizine (ZYRTEC) 10 MG tablet, Take 1 tablet (10 mg total) by mouth daily. (Patient taking differently: Take 10 mg by mouth daily as needed for allergies. ), Disp: 90 tablet, Rfl: 0 .  Coenzyme Q10 (CO Q 10 PO), Take 300 mg by mouth daily., Disp: , Rfl:  .  docusate sodium (COLACE) 100 MG capsule, Take 100 mg by mouth daily., Disp: , Rfl:  .  fluticasone (FLONASE) 50 MCG/ACT nasal spray, Place 2 sprays into both nostrils daily., Disp: 16 g, Rfl: 6 .  furosemide (LASIX) 40 MG tablet, Take 1 tablet (40 mg total) by mouth daily., Disp: 7 tablet, Rfl: 0 .  levothyroxine (SYNTHROID, LEVOTHROID) 25 MCG tablet, TAKE 1 TABLET EVERY DAY BEFORE BREAKFAST (Patient taking differently: Take 25 mcg by mouth daily before breakfast. TAKE 1 TABLET EVERY DAY BEFORE BREAKFAST), Disp: 90 tablet,  Rfl: 2 .  Melatonin 5 MG CAPS, Take 10 mg by mouth at bedtime., Disp: , Rfl:  .  metoprolol tartrate (LOPRESSOR) 25 MG tablet, Take 0.5 tablets (12.5 mg total) by mouth 2 (two) times daily., Disp: 30 tablet, Rfl: 3 .  Multiple Vitamin (MULTIVITAMIN) tablet, Take 1 tablet by mouth daily. Centrum Silver, Disp: , Rfl:  .  pantoprazole (PROTONIX) 40 MG tablet, Take 1 tablet (40 mg total) by mouth daily., Disp: 30 tablet, Rfl: 3 .  Probiotic Product (PROBIOTIC DAILY PO), Take 1 capsule by mouth daily. Florify, Disp: , Rfl:  .  Propylene Glycol (SYSTANE COMPLETE) 0.6 % SOLN, Place 1 drop into both eyes 2 (two) times daily as needed (dry eyes). , Disp: , Rfl:  .  traMADol (ULTRAM) 50 MG tablet, Take 1 tablet (50 mg total) by mouth every 8 (eight) hours as needed., Disp: 30 tablet, Rfl: 0 .  vitamin B-12 (CYANOCOBALAMIN) 1000 MCG tablet, Take 1,000 mcg by mouth daily., Disp: , Rfl:  .  warfarin (COUMADIN) 4 MG tablet, Take 1 to 1.5 tablets by mouth daily as directed by coumadin clinic, Disp: 45 tablet, Rfl: 3 .  zinc gluconate 50 MG tablet, Take 50 mg by mouth daily., Disp: , Rfl:   Past Medical History: Past Medical History:  Diagnosis Date  . Hypothyroidism   . Mitral regurgitation   .   Osteopenia   . S/P minimally invasive mitral valve repair 03/10/2018   Complex valvuloplasty including triangular resection of posterior leaflet, artificial Gore-tex neochord x6 and Size 30 Sorin Memo 4D Ring SN [id number] via right mini thoracotomy approach  . Thyroid nodule 03/07/2018   Incidental - noted on CT scan    Tobacco Use: Social History   Tobacco Use  Smoking Status Never Smoker  Smokeless Tobacco Never Used    Labs: Recent Review Flowsheet Data    Labs for ITP Cardiac and Pulmonary Rehab Latest Ref Rng & Units 03/10/2018 03/10/2018 03/10/2018 03/10/2018 03/11/2018   Cholestrol <200 mg/dL - - - - -   LDLCALC mg/dL (calc) - - - - -   HDL >50 mg/dL - - - - -   Trlycerides <150 mg/dL - - - - -    Hemoglobin A1c 4.8 - 5.6 % - - - - -   PHART 7.350 - 7.450 7.423 7.319(L) 7.319(L) - -   PCO2ART 32.0 - 48.0 mmHg 28.5(L) 43.3 37.6 - -   HCO3 20.0 - 28.0 mmol/L 18.9(L) 22.3 19.4(L) - -   TCO2 22 - 32 mmol/L 20(L) 24 20(L) 21(L) 19(L)   ACIDBASEDEF 0.0 - 2.0 mmol/L 5.0(H) 4.0(H) 6.0(H) - -   O2SAT % 98.0 98.0 95.0 - -       Exercise Target Goals: Exercise Program Goal: Individual exercise prescription set using results from initial 6 min walk test and THRR while considering  patient's activity barriers and safety.   Exercise Prescription Goal: Initial exercise prescription builds to 30-45 minutes a day of aerobic activity, 2-3 days per week.  Home exercise guidelines will be given to patient during program as part of exercise prescription that the participant will acknowledge.  Activity Barriers & Risk Stratification: Activity Barriers & Cardiac Risk Stratification - 04/11/18 1143      Activity Barriers & Cardiac Risk Stratification   Activity Barriers  None    Cardiac Risk Stratification  Low       6 Minute Walk: 6 Minute Walk    Row Name 04/11/18 1236         6 Minute Walk   Phase  Initial     Distance  1264 feet     Walk Time  6 minutes     # of Rest Breaks  0     MPH  2.39     METS  2.93     RPE  12     VO2 Peak  10.26     Symptoms  No     Resting HR  101 bpm     Resting BP  122/62     Resting Oxygen Saturation   97 %     Exercise Oxygen Saturation  during 6 min walk  100 %     Max Ex. HR  131 bpm     Max Ex. BP  134/68     2 Minute Post BP  124/62        Oxygen Initial Assessment:   Oxygen Re-Evaluation:   Oxygen Discharge (Final Oxygen Re-Evaluation):   Initial Exercise Prescription: Initial Exercise Prescription - 04/11/18 1200      Date of Initial Exercise RX and Referring Provider   Date  04/11/18    Referring Provider  Jordan, Peter MD      Treadmill   MPH  2    Grade  0.5    Minutes  15    METs  2.67        Recumbant Bike   Level   1    RPM  50    Watts  21    Minutes  15    METs  2.6      NuStep   Level  1    SPM  80    Minutes  15    METs  2      Prescription Details   Frequency (times per week)  3    Duration  Progress to 45 minutes of aerobic exercise without signs/symptoms of physical distress      Intensity   THRR 40-80% of Max Heartrate  120-139    Ratings of Perceived Exertion  11-13    Perceived Dyspnea  0-4      Progression   Progression  Continue to progress workloads to maintain intensity without signs/symptoms of physical distress.      Resistance Training   Training Prescription  Yes    Weight  3 lbs    Reps  10-15       Perform Capillary Blood Glucose checks as needed.  Exercise Prescription Changes: Exercise Prescription Changes    Row Name 04/11/18 1200 04/18/18 1500           Response to Exercise   Blood Pressure (Admit)  122/62  102/62      Blood Pressure (Exercise)  134/68  128/74      Blood Pressure (Exit)  124/62  106/56      Heart Rate (Admit)  101 bpm  99 bpm      Heart Rate (Exercise)  131 bpm  111 bpm      Heart Rate (Exit)  110 bpm  95 bpm      Oxygen Saturation (Admit)  97 %  -      Oxygen Saturation (Exercise)  100 %  -      Rating of Perceived Exertion (Exercise)  12  12      Symptoms  none  none      Comments  walk test results  -      Duration  -  Progress to 45 minutes of aerobic exercise without signs/symptoms of physical distress      Intensity  -  THRR unchanged        Progression   Progression  -  Continue to progress workloads to maintain intensity without signs/symptoms of physical distress.      Average METs  -  2.44        Resistance Training   Training Prescription  -  Yes      Weight  -  3 lbs      Reps  -  10-15        Interval Training   Interval Training  -  No        Treadmill   MPH  -  2      Grade  -  0.5      Minutes  -  15      METs  -  2.67        Recumbant Bike   Level  -  1      Watts  -  16      Minutes  -  15       METs  -  2.67        NuStep   Level  -  1      Minutes  -  15      METs  -    2         Exercise Comments: Exercise Comments    Row Name 04/13/18 0756           Exercise Comments  First full day of exercise!  Patient was oriented to gym and equipment including functions, settings, policies, and procedures.  Patient's individual exercise prescription and treatment plan were reviewed.  All starting workloads were established based on the results of the 6 minute walk test done at initial orientation visit.  The plan for exercise progression was also introduced and progression will be customized based on patient's performance and goals.          Exercise Goals and Review: Exercise Goals    Row Name 04/11/18 1244             Exercise Goals   Increase Physical Activity  Yes       Intervention  Provide advice, education, support and counseling about physical activity/exercise needs.;Develop an individualized exercise prescription for aerobic and resistive training based on initial evaluation findings, risk stratification, comorbidities and participant's personal goals.       Expected Outcomes  Short Term: Attend rehab on a regular basis to increase amount of physical activity.;Long Term: Add in home exercise to make exercise part of routine and to increase amount of physical activity.;Long Term: Exercising regularly at least 3-5 days a week.       Increase Strength and Stamina  Yes       Intervention  Provide advice, education, support and counseling about physical activity/exercise needs.;Develop an individualized exercise prescription for aerobic and resistive training based on initial evaluation findings, risk stratification, comorbidities and participant's personal goals.       Expected Outcomes  Short Term: Increase workloads from initial exercise prescription for resistance, speed, and METs.;Short Term: Perform resistance training exercises routinely during rehab and add in  resistance training at home;Long Term: Improve cardiorespiratory fitness, muscular endurance and strength as measured by increased METs and functional capacity (6MWT)       Able to understand and use rate of perceived exertion (RPE) scale  Yes       Intervention  Provide education and explanation on how to use RPE scale       Expected Outcomes  Short Term: Able to use RPE daily in rehab to express subjective intensity level;Long Term:  Able to use RPE to guide intensity level when exercising independently       Knowledge and understanding of Target Heart Rate Range (THRR)  Yes       Intervention  Provide education and explanation of THRR including how the numbers were predicted and where they are located for reference       Expected Outcomes  Short Term: Able to state/look up THRR;Short Term: Able to use daily as guideline for intensity in rehab;Long Term: Able to use THRR to govern intensity when exercising independently       Able to check pulse independently  Yes       Intervention  Provide education and demonstration on how to check pulse in carotid and radial arteries.;Review the importance of being able to check your own pulse for safety during independent exercise       Expected Outcomes  Short Term: Able to explain why pulse checking is important during independent exercise;Long Term: Able to check pulse independently and accurately       Understanding of Exercise Prescription  Yes       Intervention  Provide education, explanation, and   written materials on patient's individual exercise prescription       Expected Outcomes  Short Term: Able to explain program exercise prescription;Long Term: Able to explain home exercise prescription to exercise independently          Exercise Goals Re-Evaluation : Exercise Goals Re-Evaluation    Row Name 04/13/18 0756 04/18/18 1535           Exercise Goal Re-Evaluation   Exercise Goals Review  Able to understand and use rate of perceived exertion  (RPE) scale;Knowledge and understanding of Target Heart Rate Range (THRR);Understanding of Exercise Prescription  Increase Physical Activity;Increase Strength and Stamina;Understanding of Exercise Prescription      Comments  Reviewed RPE scale, THR and program prescription with pt today.  Pt voiced understanding and was given a copy of goals to take home.   Pat is off to a good start in rehab.  She is doing well with her baseline workloads and should be ready to start to move up next week. We will continue to monitor her progres.       Expected Outcomes  Short: Use RPE daily to regulate intensity. Long: Follow program prescription in THR.  Short: Review home exercise guidelines.  Long: Continue to follow program prescription.          Discharge Exercise Prescription (Final Exercise Prescription Changes): Exercise Prescription Changes - 04/18/18 1500      Response to Exercise   Blood Pressure (Admit)  102/62    Blood Pressure (Exercise)  128/74    Blood Pressure (Exit)  106/56    Heart Rate (Admit)  99 bpm    Heart Rate (Exercise)  111 bpm    Heart Rate (Exit)  95 bpm    Rating of Perceived Exertion (Exercise)  12    Symptoms  none    Duration  Progress to 45 minutes of aerobic exercise without signs/symptoms of physical distress    Intensity  THRR unchanged      Progression   Progression  Continue to progress workloads to maintain intensity without signs/symptoms of physical distress.    Average METs  2.44      Resistance Training   Training Prescription  Yes    Weight  3 lbs    Reps  10-15      Interval Training   Interval Training  No      Treadmill   MPH  2    Grade  0.5    Minutes  15    METs  2.67      Recumbant Bike   Level  1    Watts  16    Minutes  15    METs  2.67      NuStep   Level  1    Minutes  15    METs  2       Nutrition:  Target Goals: Understanding of nutrition guidelines, daily intake of sodium <1500mg, cholesterol <200mg, calories 30% from fat  and 7% or less from saturated fats, daily to have 5 or more servings of fruits and vegetables.  Biometrics: Pre Biometrics - 04/11/18 1245      Pre Biometrics   Height  5' 2.5" (1.588 m)    Weight  157 lb 1.6 oz (71.3 kg)    Waist Circumference  33 inches    Hip Circumference  41 inches    Waist to Hip Ratio  0.8 %    BMI (Calculated)  28.26    Single Leg   Stand  4.48 seconds        Nutrition Therapy Plan and Nutrition Goals: Nutrition Therapy & Goals - 04/13/18 0934      Nutrition Therapy   Diet  TLC    Drug/Food Interactions  Coumadin/Vit K    Protein (specify units)  6-7oz    Fiber  20 grams    Whole Grain Foods  3 servings   does not always choose whole grains   Saturated Fats  12 max. grams    Fruits and Vegetables  5 servings/day   8 ideal, eats fruits and vegetables daily but does not always eat 3 meals/day   Sodium  1500 grams      Personal Nutrition Goals   Nutrition Goal  Start to read food labels to identify items high in sodium, such as canned soups. Choose lower sodium varietites when possible    Personal Goal #2  Use salt free herbs and seasonings to add flavor variety to meals without adding salt    Comments  She has not added much salt to meals for "years" and typically chooses fruits as snacks. Breakfast: eggs, toast, cheese toast, fruit. Lunch: largest meal of the day, steamed vegetables + a meat (chicken, occasionally red meat, pork chop, fish, shrimp), Dinner: light meal of leftovers typically. Beverages: water, recently has been drinking more diluted juice. She has been known to skip dinner      Intervention Plan   Intervention  Prescribe, educate and counsel regarding individualized specific dietary modifications aiming towards targeted core components such as weight, hypertension, lipid management, diabetes, heart failure and other comorbidities.;Nutrition handout(s) given to patient.   Coumadin nutrition therapy/ foods high in Vitamin K   Expected  Outcomes  Short Term Goal: Understand basic principles of dietary content, such as calories, fat, sodium, cholesterol and nutrients.;Short Term Goal: A plan has been developed with personal nutrition goals set during dietitian appointment.;Long Term Goal: Adherence to prescribed nutrition plan.       Nutrition Assessments: Nutrition Assessments - 04/11/18 1033      MEDFICTS Scores   Pre Score  18       Nutrition Goals Re-Evaluation: Nutrition Goals Re-Evaluation    Row Name 04/13/18 0950             Goals   Nutrition Goal  Start to read food labels to identify items high in sodium, such as canned soups. Choose lower sodium varietites when possible       Comment  She does not add salt to many meals at home but does not typically read food labels to identify sodium content       Expected Outcome  She will start to read food labels consistently and choose items that are lower in sodium         Personal Goal #2 Re-Evaluation   Personal Goal #2  Use salt free herbs and seasonings to add flavor variety to meals without adding salt          Nutrition Goals Discharge (Final Nutrition Goals Re-Evaluation): Nutrition Goals Re-Evaluation - 04/13/18 0950      Goals   Nutrition Goal  Start to read food labels to identify items high in sodium, such as canned soups. Choose lower sodium varietites when possible    Comment  She does not add salt to many meals at home but does not typically read food labels to identify sodium content    Expected Outcome  She will start to read food labels consistently   and choose items that are lower in sodium      Personal Goal #2 Re-Evaluation   Personal Goal #2  Use salt free herbs and seasonings to add flavor variety to meals without adding salt       Psychosocial: Target Goals: Acknowledge presence or absence of significant depression and/or stress, maximize coping skills, provide positive support system. Participant is able to verbalize types and  ability to use techniques and skills needed for reducing stress and depression.   Initial Review & Psychosocial Screening: Initial Psych Review & Screening - 04/11/18 1141      Initial Review   Current issues with  Current Sleep Concerns;Current Stress Concerns    Comments  Pat lives with her 92 year old mother. She helps care for her and shared that it can be stressful at times. She feels like she has a good support system with her son and friends. She lost her husband 22 years ago and reports that she has never slept the same since. Her surgical discomfort has made sleeping more of an issue lately, but said its getting better.       Family Dynamics   Good Support System?  Yes   son, daughter-in-law, friends     Barriers   Psychosocial barriers to participate in program  There are no identifiable barriers or psychosocial needs.;The patient should benefit from training in stress management and relaxation.      Screening Interventions   Interventions  Encouraged to exercise;Program counselor consult;To provide support and resources with identified psychosocial needs;Provide feedback about the scores to participant    Expected Outcomes  Short Term goal: Utilizing psychosocial counselor, staff and physician to assist with identification of specific Stressors or current issues interfering with healing process. Setting desired goal for each stressor or current issue identified.;Long Term Goal: Stressors or current issues are controlled or eliminated.;Short Term goal: Identification and review with participant of any Quality of Life or Depression concerns found by scoring the questionnaire.;Long Term goal: The participant improves quality of Life and PHQ9 Scores as seen by post scores and/or verbalization of changes       Quality of Life Scores:  Quality of Life - 04/11/18 1032      Quality of Life   Select  Quality of Life      Quality of Life Scores   Health/Function Pre  26.31 %     Socioeconomic Pre  30 %    Psych/Spiritual Pre  29.14 %    Family Pre  30 %    GLOBAL Pre  28.14 %      Scores of 19 and below usually indicate a poorer quality of life in these areas.  A difference of  2-3 points is a clinically meaningful difference.  A difference of 2-3 points in the total score of the Quality of Life Index has been associated with significant improvement in overall quality of life, self-image, physical symptoms, and general health in studies assessing change in quality of life.  PHQ-9: Recent Review Flowsheet Data    Depression screen PHQ 2/9 04/11/2018 04/04/2018 08/23/2017 05/10/2017 08/17/2016   Decreased Interest 0 0 0 0 0   Down, Depressed, Hopeless 0 0 0 0 0   PHQ - 2 Score 0 0 0 0 0   Altered sleeping 1 - - - 0   Tired, decreased energy 1 - - - 0   Change in appetite 0 - - - 0   Feeling bad or failure about yourself    0 - - - 0   Trouble concentrating 0 - - - 0   Moving slowly or fidgety/restless 0 - - - 0   Suicidal thoughts 0 - - - 0   PHQ-9 Score 2 - - - 0   Difficult doing work/chores Not difficult at all - - - Not difficult at all     Interpretation of Total Score  Total Score Depression Severity:  1-4 = Minimal depression, 5-9 = Mild depression, 10-14 = Moderate depression, 15-19 = Moderately severe depression, 20-27 = Severe depression   Psychosocial Evaluation and Intervention: Psychosocial Evaluation - 04/13/18 0928      Psychosocial Evaluation & Interventions   Interventions  Stress management education;Relaxation education;Encouraged to exercise with the program and follow exercise prescription    Comments  Counselor met with Ms. Petras (Pat) today for initial psychsocial evaluation.  She is a 72 year old who had a mitral valve replaced on 10/17.  Pat has a strong support system as she lives with her 92 year old mother; has a son and daughter-in-law living close by; a close friend and active involvement in her local church.  Pat reports having  osteoporosis in addition to her heart condition that she struggles with.  She sleeps "okay" with ~6-7 hours/night with the help of melatonin and her appetite has been less since the surgery.  Pat reports having a history of depression after her spouse passed away 22 years ago and was on medication for awhile - but no current symptoms or medication for this.   She reports having some anxiety currently with caring for her 92 year old mother "who is stubborn".  Pat's mood is stable and her primary stressors are her own health and caring for her mother.  She has goals to increase her strength and improve her balance as well as learn the "secrets of healthy eating and stick to it!"  Staff will follow with her throughout the course of this program.      Expected Outcomes  Short:  Pat will exercise for her health and as a stress reducer.  She will meet with the dietician to address her healthy eating goals and attend the nutrition education as well as the stress management education.  Long:  Pat will develop a regimen of consistent exercise and diet for her health and mental health.      Continue Psychosocial Services   Follow up required by staff       Psychosocial Re-Evaluation:   Psychosocial Discharge (Final Psychosocial Re-Evaluation):   Vocational Rehabilitation: Provide vocational rehab assistance to qualifying candidates.   Vocational Rehab Evaluation & Intervention: Vocational Rehab - 04/11/18 1033      Initial Vocational Rehab Evaluation & Intervention   Assessment shows need for Vocational Rehabilitation  No       Education: Education Goals: Education classes will be provided on a variety of topics geared toward better understanding of heart health and risk factor modification. Participant will state understanding/return demonstration of topics presented as noted by education test scores.  Learning Barriers/Preferences: Learning Barriers/Preferences - 04/11/18 1141      Learning  Barriers/Preferences   Learning Barriers  None    Learning Preferences  None       Education Topics:  AED/CPR: - Group verbal and written instruction with the use of models to demonstrate the basic use of the AED with the basic ABC's of resuscitation.   General Nutrition Guidelines/Fats and Fiber: -Group instruction provided by verbal, written material, models   and posters to present the general guidelines for heart healthy nutrition. Gives an explanation and review of dietary fats and fiber.   Controlling Sodium/Reading Food Labels: -Group verbal and written material supporting the discussion of sodium use in heart healthy nutrition. Review and explanation with models, verbal and written materials for utilization of the food label.   Exercise Physiology & General Exercise Guidelines: - Group verbal and written instruction with models to review the exercise physiology of the cardiovascular system and associated critical values. Provides general exercise guidelines with specific guidelines to those with heart or lung disease.    Aerobic Exercise & Resistance Training: - Gives group verbal and written instruction on the various components of exercise. Focuses on aerobic and resistive training programs and the benefits of this training and how to safely progress through these programs..   Cardiac Rehab from 04/25/2018 in [facility]  Date  04/13/18  Educator  JH  Instruction Review Code  1- Verbalizes Understanding      Flexibility, Balance, Mind/Body Relaxation: Provides group verbal/written instruction on the benefits of flexibility and balance training, including mind/body exercise modes such as yoga, pilates and tai chi.  Demonstration and skill practice provided.   Cardiac Rehab from 04/25/2018 in [facility]  Date  04/18/18  Educator  KH  Instruction Review Code  1- Verbalizes Understanding      Stress and Anxiety: - Provides group  verbal and written instruction about the health risks of elevated stress and causes of high stress.  Discuss the correlation between heart/lung disease and anxiety and treatment options. Review healthy ways to manage with stress and anxiety.   Depression: - Provides group verbal and written instruction on the correlation between heart/lung disease and depressed mood, treatment options, and the stigmas associated with seeking treatment.   Anatomy & Physiology of the Heart: - Group verbal and written instruction and models provide basic cardiac anatomy and physiology, with the coronary electrical and arterial systems. Review of Valvular disease and Heart Failure   Cardiac Procedures: - Group verbal and written instruction to review commonly prescribed medications for heart disease. Reviews the medication, class of the drug, and side effects. Includes the steps to properly store meds and maintain the prescription regimen. (beta blockers and nitrates)   Cardiac Medications I: - Group verbal and written instruction to review commonly prescribed medications for heart disease. Reviews the medication, class of the drug, and side effects. Includes the steps to properly store meds and maintain the prescription regimen.   Cardiac Medications II: -Group verbal and written instruction to review commonly prescribed medications for heart disease. Reviews the medication, class of the drug, and side effects. (all other drug classes)   Cardiac Rehab from 04/25/2018 in [facility]  Date  04/25/18  Educator  SB  Instruction Review Code  1- Verbalizes Understanding       Go Sex-Intimacy & Heart Disease, Get SMART - Goal Setting: - Group verbal and written instruction through game format to discuss heart disease and the return to sexual intimacy. Provides group verbal and written material to discuss and apply goal setting through the application of the S.M.A.R.T. Method.   Other  Matters of the Heart: - Provides group verbal, written materials and models to describe Stable Angina and Peripheral Artery. Includes description of the disease process and treatment options available to the cardiac patient.   Exercise & Equipment Safety: - Individual verbal instruction and demonstration of equipment use   and safety with use of the equipment.   Cardiac Rehab from 04/25/2018 in [facility]  Date  04/11/18  Educator  MC  Instruction Review Code  1- Verbalizes Understanding      Infection Prevention: - Provides verbal and written material to individual with discussion of infection control including proper hand washing and proper equipment cleaning during exercise session.   Cardiac Rehab from 04/25/2018 in [facility]  Date  04/11/18  Educator  MC  Instruction Review Code  1- Verbalizes Understanding      Falls Prevention: - Provides verbal and written material to individual with discussion of falls prevention and safety.   Cardiac Rehab from 04/25/2018 in [facility]  Date  04/11/18  Educator  MC  Instruction Review Code  1- Verbalizes Understanding      Diabetes: - Individual verbal and written instruction to review signs/symptoms of diabetes, desired ranges of glucose level fasting, after meals and with exercise. Acknowledge that pre and post exercise glucose checks will be done for 3 sessions at entry of program.   Know Your Numbers and Risk Factors: -Group verbal and written instruction about important numbers in your health.  Discussion of what are risk factors and how they play a role in the disease process.  Review of Cholesterol, Blood Pressure, Diabetes, and BMI and the role they play in your overall health.   Cardiac Rehab from 04/25/2018 in [facility]  Date  04/25/18  Educator  SB  Instruction Review Code  1- Verbalizes Understanding      Sleep Hygiene: -Provides  group verbal and written instruction about how sleep can affect your health.  Define sleep hygiene, discuss sleep cycles and impact of sleep habits. Review good sleep hygiene tips.    Other: -Provides group and verbal instruction on various topics (see comments)   Knowledge Questionnaire Score: Knowledge Questionnaire Score - 04/11/18 1032      Knowledge Questionnaire Score   Pre Score  24/26   correct answers reviewed with Pat; focus on stress and angina      Core Components/Risk Factors/Patient Goals at Admission: Personal Goals and Risk Factors at Admission - 04/11/18 1140      Core Components/Risk Factors/Patient Goals on Admission    Weight Management  Yes;Weight Loss    Intervention  Weight Management: Develop a combined nutrition and exercise program designed to reach desired caloric intake, while maintaining appropriate intake of nutrient and fiber, sodium and fats, and appropriate energy expenditure required for the weight goal.;Weight Management: Provide education and appropriate resources to help participant work on and attain dietary goals.    Admit Weight  157 lb 1.6 oz (71.3 kg)    Goal Weight: Short Term  153 lb (69.4 kg)    Goal Weight: Long Term  145 lb (65.8 kg)    Expected Outcomes  Short Term: Continue to assess and modify interventions until short term weight is achieved;Long Term: Adherence to nutrition and physical activity/exercise program aimed toward attainment of established weight goal;Weight Loss: Understanding of general recommendations for a balanced deficit meal plan, which promotes 1-2 lb weight loss per week and includes a negative energy balance of 500-1000 kcal/d;Understanding recommendations for meals to include 15-35% energy as protein, 25-35% energy from fat, 35-60% energy from carbohydrates, less than 200mg of dietary cholesterol, 20-35 gm of total fiber daily;Understanding of distribution of calorie intake throughout the day with the consumption of  4-5 meals/snacks         Core Components/Risk Factors/Patient Goals Review:    Core Components/Risk Factors/Patient Goals at Discharge (Final Review):    ITP Comments: ITP Comments    Row Name 04/11/18 1131 04/27/18 0550 04/29/18 1229       ITP Comments  Med Review completed. Initial ITP created. Diagnosis can be found in [facility] Encounter 10/17  30 day review. Continue with ITP unless direccted changes per Medical Director Chart Review. New to program  Fraser Din has called to state she has keratosis in her eye and is undergoing medical treatment. She does not expect to return to class in the near future and is asking to be discharged.  We will readmit her and start form where she left off once her eyes are better.         Comments: Discharge for medical reasons

## 2018-05-03 DIAGNOSIS — H16203 Unspecified keratoconjunctivitis, bilateral: Secondary | ICD-10-CM | POA: Diagnosis not present

## 2018-05-03 NOTE — Progress Notes (Signed)
Cardiology Office Note    Date:  05/04/2018   ID:  [name], [name] 12/31/1945, MRN [id number]  PCP:  Susy Frizzle, MD  Cardiologist:  Dr. Martinique  Chief Complaint  Patient presents with  . Mitral Valve Prolapse    History of Present Illness:  Maria Gomez is a 72 y.o. female seen for followup Mitral insufficiency. She was intially evaluated by Almyra Deforest PA-C on 11/03/17 for evaluation of a heart murmur. She has a  PMH of hypothyroidism and HLD but no prior cardiac history.  On the previous office visit with his primary care provider in March 2018, a heart murmur was heard.  Echocardiogram obtained on 09/01/2016 showed EF 60 to 65%, mild LVH with moderate focal basal septal hypertrophy, grade 1 DD, thickened mitral leaflet with posterior leaflet prolapse, mild MR, moderate LAE, PA peak pressure was 21 mmHg.  Carotid Doppler obtained on 09/04/2016 showed no significant disease bilaterally.  Echocardiogram was repeated on 09/20/2017, this showed EF 55 to 60%, bileaflet prolapse of mitral valve with at least moderate to severe MR directed anteriorly into the left atrium, PA peak pressure 35 mmHg. She subsequently underwent TEE showing bileaflet MVP with a flail P2 segment and severe MR. She subsequently underwent [facility] on 8/27 confirming severe MR. Normal coronary arteries. Normal right heart pressures.   She had minimally invasive mitral valve repair on 03/10/2018 by Dr. Roxy Manns. She was started on  coumadin. following her surgery she did develop a cough. She was seen by Dr. Dennard Schaumann and cough resolved with tessalon perles and antacid therapy. This past week she developed cloudiness in her vision and then lost vision and developed severe eye pain. Seen by ophthalmology and is being treated for an eye infection.   She denies any chest pain, dyspnea, edema, palpitations.   Past Medical History:  Diagnosis Date  . Hypothyroidism   . Mitral regurgitation   . Osteopenia   . S/P  minimally invasive mitral valve repair 03/10/2018   Complex valvuloplasty including triangular resection of posterior leaflet, artificial Gore-tex neochord x6 and Size 30 Sorin Memo 4D Ring SN [id number] via right mini thoracotomy approach  . Thyroid nodule 03/07/2018   Incidental - noted on CT scan    Past Surgical History:  Procedure Laterality Date  . BREAST EXCISIONAL BIOPSY Bilateral   . MITRAL VALVE REPAIR Right 03/10/2018   Procedure: MINIMALLY INVASIVE MITRAL VALVE REPAIR (MVR):  -Triangular Resection of Posterior Leaflet P2; -Shortening Plasty of Posterior Leaflet P2; -Placement of Goretex Neo Chords x 6 using Chord-X system; -Ring Annuloplasty with a 30 mm Sorin Memo 4D Ring;  Surgeon: Rexene Alberts, MD;  Location: [facility];  Service: Open Heart Surgery;  Laterality: Right;  . RIGHT/LEFT HEART CATH AND CORONARY ANGIOGRAPHY N/A 01/18/2018   Procedure: RIGHT/LEFT HEART CATH AND CORONARY ANGIOGRAPHY;  Surgeon: Martinique, Kaitrin Seybold M, MD;  Location: [facility];  Service: Cardiovascular;  Laterality: N/A;  . TEE WITHOUT CARDIOVERSION N/A 11/09/2017   Procedure: TRANSESOPHAGEAL ECHOCARDIOGRAM (TEE);  Surgeon: Larey Dresser, MD;  Location: [facility];  Service: Cardiovascular;  Laterality: N/A;  . TEE WITHOUT CARDIOVERSION N/A 03/10/2018   Procedure: TRANSESOPHAGEAL ECHOCARDIOGRAM (TEE);  Surgeon: Rexene Alberts, MD;  Location: [facility];  Service: Open Heart Surgery;  Laterality: N/A;    Current Medications: Outpatient Medications Prior to Visit  Medication Sig Dispense Refill  . alendronate (FOSAMAX) 70 MG tablet Take 1 tablet (70 mg total) by mouth every 7 (seven) days. Take  with a full glass of water on an empty stomach.Sit upright for 30 minutes after taking 12 tablet 3  . aspirin EC 81 MG EC tablet Take 1 tablet (81 mg total) by mouth daily.    . Biotin 5000 MCG TABS Take 5,000 mcg by mouth daily.     . cetirizine (ZYRTEC) 10 MG tablet Take 1 tablet (10 mg total) by mouth daily.  (Patient taking differently: Take 10 mg by mouth daily as needed for allergies. ) 90 tablet 0  . Coenzyme Q10 (CO Q 10 PO) Take 300 mg by mouth daily.    Marland Kitchen docusate sodium (COLACE) 100 MG capsule Take 100 mg by mouth daily.    Marland Kitchen erythromycin ophthalmic ointment Place 1 application into both eyes as directed.  1  . fluticasone (FLONASE) 50 MCG/ACT nasal spray Place 2 sprays into both nostrils daily. 16 g 6  . levothyroxine (SYNTHROID, LEVOTHROID) 25 MCG tablet TAKE 1 TABLET EVERY DAY BEFORE BREAKFAST (Patient taking differently: Take 25 mcg by mouth daily before breakfast. TAKE 1 TABLET EVERY DAY BEFORE BREAKFAST) 90 tablet 2  . Melatonin 5 MG CAPS Take 10 mg by mouth at bedtime.    . Multiple Vitamin (MULTIVITAMIN) tablet Take 1 tablet by mouth daily. Centrum Silver    . Probiotic Product (PROBIOTIC DAILY PO) Take 1 capsule by mouth daily. Florify    . Propylene Glycol (SYSTANE COMPLETE) 0.6 % SOLN Place 1 drop into both eyes 2 (two) times daily as needed (dry eyes).     . valACYclovir (VALTREX) 1000 MG tablet Take 1,000 mg by mouth 3 (three) times daily.  1  . vitamin B-12 (CYANOCOBALAMIN) 1000 MCG tablet Take 1,000 mcg by mouth daily.    Marland Kitchen warfarin (COUMADIN) 4 MG tablet Take 1 to 1.5 tablets by mouth daily as directed by coumadin clinic 45 tablet 3  . zinc gluconate 50 MG tablet Take 50 mg by mouth daily.    Marland Kitchen acetaminophen (TYLENOL) 500 MG tablet Take 500 mg by mouth every 8 (eight) hours as needed.    . benzonatate (TESSALON) 200 MG capsule Take 1 capsule (200 mg total) by mouth 3 (three) times daily as needed for cough. 30 capsule 0  . furosemide (LASIX) 40 MG tablet Take 1 tablet (40 mg total) by mouth daily. 7 tablet 0  . metoprolol tartrate (LOPRESSOR) 25 MG tablet Take 0.5 tablets (12.5 mg total) by mouth 2 (two) times daily. 30 tablet 3  . pantoprazole (PROTONIX) 40 MG tablet Take 1 tablet (40 mg total) by mouth daily. 30 tablet 3  . traMADol (ULTRAM) 50 MG tablet Take 1 tablet (50 mg  total) by mouth every 8 (eight) hours as needed. 30 tablet 0   No facility-administered medications prior to visit.      Allergies:   Niaspan [niacin er]; Phenazopyridine hcl; and Statins   Social History   Socioeconomic History  . Marital status: Widowed    Spouse name: Not on file  . Number of children: Not on file  . Years of education: Not on file  . Highest education level: Not on file  Occupational History  . Not on file  Social Needs  . Financial resource strain: Not on file  . Food insecurity:    Worry: Not on file    Inability: Not on file  . Transportation needs:    Medical: Not on file    Non-medical: Not on file  Tobacco Use  . Smoking status: Never Smoker  . Smokeless  tobacco: Never Used  Substance and Sexual Activity  . Alcohol use: No  . Drug use: No  . Sexual activity: Not on file  Lifestyle  . Physical activity:    Days per week: Not on file    Minutes per session: Not on file  . Stress: Not on file  Relationships  . Social connections:    Talks on phone: Not on file    Gets together: Not on file    Attends religious service: Not on file    Active member of club or organization: Not on file    Attends meetings of clubs or organizations: Not on file    Relationship status: Not on file  Other Topics Concern  . Not on file  Social History Narrative   Lives with her mom.    Takes care of aunt who lives across the street.    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           Family History:  The patient's family history includes Cancer (age of onset: 59) in her brother; Cancer (age of onset: 39) in her maternal grandmother; Heart disease in her father; Stroke in her paternal grandfather; Stroke (age of onset: 69) in her maternal grandfather.   ROS:   Please see the history of present illness.    ROS All other systems reviewed and are negative.   PHYSICAL EXAM:   VS:  BP 104/76   Pulse 96   Ht 5' 1.5" (1.562 m)   Wt  151 lb 3.2 oz (68.6 kg)   SpO2 100%   BMI 28.11 kg/m    GENERAL:  Well appearing overweight WF in NAD HEENT:  PERRL, EOMI, sclera are clear. Oropharynx is clear. NECK:  No jugular venous distention, carotid upstroke brisk and symmetric, no bruits, no thyromegaly or adenopathy LUNGS:  Clear to auscultation bilaterally CHEST:  Unremarkable HEART:  RRR,  PMI not displaced or sustained,S1 and S2 within normal limits, no S3, no S4: no clicks, no rubs, gr 1/6 systolic murmur at the apex. ABD:  Soft, nontender. BS +, no masses or bruits. No hepatomegaly, no splenomegaly EXT:  2 + pulses throughout, no edema, no cyanosis no clubbing. Radial cath site has healed well. SKIN:  Warm and dry.  No rashes NEURO:  Alert and oriented x 3. Cranial nerves II through XII intact. PSYCH:  Cognitively intact    Wt Readings from Last 3 Encounters:  05/04/18 151 lb 3.2 oz (68.6 kg)  04/12/18 154 lb (69.9 kg)  04/11/18 157 lb 1.6 oz (71.3 kg)      Studies/Labs Reviewed:   EKG:  EKG is not ordered today.    Recent Labs: 08/16/2017: TSH 2.32 03/07/2018: ALT 14 03/11/2018: Magnesium 2.6 03/14/2018: Hemoglobin 9.3; Platelets 155 03/16/2018: BUN 10; Creatinine, Ser 0.89; Potassium 4.7; Sodium 138   Lipid Panel    Component Value Date/Time   CHOL 199 08/16/2017 0907   TRIG 193 (H) 08/16/2017 0907   HDL 45 (L) 08/16/2017 0907   CHOLHDL 4.4 08/16/2017 0907   VLDL 31 (H) 08/11/2016 0857   LDLCALC 123 (H) 08/16/2017 0907    Additional studies/ records that were reviewed today include:   Echo 09/01/2016 LV EF: 60% -   65% Study Conclusions  - Left ventricle: The cavity size was normal. Wall thickness was   increased in a pattern of mild LVH. There was moderate focal   basal hypertrophy of the septum. Systolic  function was normal.   The estimated ejection fraction was in the range of 60% to 65%.   Wall motion was normal; there were no regional wall motion   abnormalities. Doppler parameters are  consistent with abnormal   left ventricular relaxation (grade 1 diastolic dysfunction). The   E/e&' ratio is >15, suggesting elevated LV filling pressure. - Aortic valve: Trileaflet. Sclerosis without stenosis. There was   no regurgitation. - Mitral valve: Thickened leaflets with posterior leaflet prolapse.   Trace to mild regurgitation. - Left atrium: The atrium was moderately dilated. - Tricuspid valve: There was trivial regurgitation. - Pulmonary arteries: PA peak pressure: 21 mm Hg (S). - Inferior vena cava: The vessel was normal in size. The   respirophasic diameter changes were in the normal range (>= 50%),   consistent with normal central venous pressure.  Impressions:  - LVEF 60-65%, mild LVH with moderate focal basal septal   hypertrophy, normal wall motion, grade 1 DD with elevated LV   filling pressure, aortic valve sclerosis, posterior mitral valve   leaflet prolapse with mild to moderate anteriorly directed mitral   regurgitation, moderate LAE, trivial TR, RVSP 21 mmHg, normal   IVC.   Echo 09/20/2017 LV EF: 55% -   60% Study Conclusions  - Left ventricle: The cavity size was normal. Wall thickness was   increased in a pattern of mild LVH. Systolic function was normal.   The estimated ejection fraction was in the range of 55% to 60%. - Mitral valve: There is bileaflet prolapse of the MV MR is   directed anteirorly into the left atrium IT is at least   moderately severe . - Left atrium: The atrium was mildly dilated. - Pulmonary arteries: PA peak pressure: 35 mm Hg (S).  TEE 11/09/17: Study Conclusions  - Left ventricle: The cavity size was normal. Wall thickness was   normal. The estimated ejection fraction was 60%. Wall motion was   normal; there were no regional wall motion abnormalities. - Aortic valve: There was no stenosis. There was trivial   regurgitation. - Aorta: Normal caliber aorta with minimal plaque. - Left atrium: The atrium was mildly  dilated. No evidence of   thrombus in the atrial cavity or appendage. - Right ventricle: The cavity size was normal. Systolic function   was normal. - Right atrium: No evidence of thrombus in the atrial cavity or   appendage. - Atrial septum: No ASD/PFO by color doppler. - Tricuspid valve: Peak RV-RA gradient (S): 18 mm Hg. - Impressions: Severe mitral regurgitation in setting of bileaflet   MVP and partial flail P2 scallop.  Impressions:  - Severe mitral regurgitation in setting of bileaflet MVP and   partial flail P2 scallop.  Cardiac cath 01/18/18: Procedures   RIGHT/LEFT HEART CATH AND CORONARY ANGIOGRAPHY  Conclusion     The left ventricular systolic function is normal.  LV end diastolic pressure is normal.  The left ventricular ejection fraction is 55-65% by visual estimate.  There is severe (4+) mitral regurgitation and moderate mitral valve prolapse.   1. Normal coronary anatomy 2. Normal LV function 3. Mitral valve prolapse with severe MR 3-4+ 4. Normal right heart pressures 5. Normal LV filling pressures  No indication for antiplatelet therapy at this time.   Will refer to Dr. Roxy Manns for consideration of MV repair    Minimally-Invasive Mitral Valve Repair Complex valvuloplasty including triangular resection of flail segment (P2) of posterior leaflet Shortening plasty of posterior leaflet Artificial Gore-tex neochord placement  x6 Sorin Memo 4D Ring Annuloplasty (size 5mm, catalog #[id number], serial [id number])  ASSESSMENT:    1. Severe mitral valve regurgitation   2. Chronic cough   3. S/P minimally invasive mitral valve repair   4. Hypothyroidism, unspecified type      PLAN:  In order of problems listed above:  1. Mitral valve regurgitation secondary to partially flail P2 segment of the MV with MV prolapse. MR is severe by TEE with jet directed anteriorly. This has increased compared to Echo done in  April 2018. Now s/p MV repair on 03/10/18. She has made an excellent recovery. Cough probably related to ET tube has resolved. No arrhythmia or evidence of CHF. Will schedule for follow up Echo. Will check with Dr. Roxy Manns to see how long to continue Coumadin.   2. Hyperlipidemia: mild. For now recommend lifestyle/diet modification.  3. Hypothyroidism: Managed by primary care provider  4.   Eye infection- managed by ophthalmology. Patient notes marked improvement.     Medication Adjustments/Labs and Tests Ordered: Current medicines are reviewed at length with the patient today.  Concerns regarding medicines are outlined above.  Medication changes, Labs and Tests ordered today are listed in the Patient Instructions below. Patient Instructions  We will schedule you for an Echocardiogram  Will check with Dr Roxy Manns about the coumadin  Follow up in 3 months    Signed, Ryder Man Martinique, MD  05/04/2018 9:08 AM    [address], [place], [place]  [address] Phone: [phone]; Fax: [phone]

## 2018-05-04 ENCOUNTER — Ambulatory Visit: Payer: Medicare HMO | Admitting: Cardiology

## 2018-05-04 ENCOUNTER — Ambulatory Visit (INDEPENDENT_AMBULATORY_CARE_PROVIDER_SITE_OTHER): Payer: Medicare HMO | Admitting: Pharmacist Clinician (PhC)/ Clinical Pharmacy Specialist

## 2018-05-04 ENCOUNTER — Encounter: Payer: Self-pay | Admitting: Cardiology

## 2018-05-04 VITALS — BP 104/76 | HR 96 | Ht 61.5 in | Wt 151.2 lb

## 2018-05-04 DIAGNOSIS — Z7901 Long term (current) use of anticoagulants: Secondary | ICD-10-CM | POA: Diagnosis not present

## 2018-05-04 DIAGNOSIS — Z9889 Other specified postprocedural states: Secondary | ICD-10-CM | POA: Diagnosis not present

## 2018-05-04 DIAGNOSIS — E039 Hypothyroidism, unspecified: Secondary | ICD-10-CM

## 2018-05-04 DIAGNOSIS — R05 Cough: Secondary | ICD-10-CM

## 2018-05-04 DIAGNOSIS — I340 Nonrheumatic mitral (valve) insufficiency: Secondary | ICD-10-CM

## 2018-05-04 DIAGNOSIS — R053 Chronic cough: Secondary | ICD-10-CM

## 2018-05-04 LAB — POCT INR: INR: 1.1 — AB (ref 2.0–3.0)

## 2018-05-04 MED ORDER — FLUTICASONE PROPIONATE 50 MCG/ACT NA SUSP: 2.0000 | Freq: Every day | NASAL | 6 refills | Status: DC | PRN

## 2018-05-04 NOTE — Patient Instructions (Signed)
We will schedule you for an Echocardiogram  Will check with Dr Roxy Manns about the coumadin  Follow up in 3 months

## 2018-05-04 NOTE — Addendum Note (Signed)
Addended by: Kathyrn Lass on: 05/04/2018 09:14 AM   Modules accepted: Orders

## 2018-05-12 ENCOUNTER — Ambulatory Visit (HOSPITAL_COMMUNITY): Payer: Medicare HMO | Attending: Cardiology

## 2018-05-12 ENCOUNTER — Other Ambulatory Visit: Payer: Self-pay

## 2018-05-12 DIAGNOSIS — R05 Cough: Secondary | ICD-10-CM | POA: Insufficient documentation

## 2018-05-12 DIAGNOSIS — R053 Chronic cough: Secondary | ICD-10-CM

## 2018-05-12 DIAGNOSIS — Z9889 Other specified postprocedural states: Secondary | ICD-10-CM | POA: Diagnosis not present

## 2018-05-12 DIAGNOSIS — E039 Hypothyroidism, unspecified: Secondary | ICD-10-CM | POA: Diagnosis not present

## 2018-05-12 DIAGNOSIS — H16203 Unspecified keratoconjunctivitis, bilateral: Secondary | ICD-10-CM | POA: Diagnosis not present

## 2018-05-12 DIAGNOSIS — I340 Nonrheumatic mitral (valve) insufficiency: Secondary | ICD-10-CM | POA: Insufficient documentation

## 2018-05-13 ENCOUNTER — Ambulatory Visit: Payer: Medicare HMO | Admitting: Pharmacist

## 2018-05-13 DIAGNOSIS — Z9889 Other specified postprocedural states: Secondary | ICD-10-CM

## 2018-05-13 DIAGNOSIS — Z7901 Long term (current) use of anticoagulants: Secondary | ICD-10-CM | POA: Diagnosis not present

## 2018-05-13 LAB — POCT INR: INR: 1.5 — AB (ref 2.0–3.0)

## 2018-05-16 ENCOUNTER — Other Ambulatory Visit: Payer: Self-pay | Admitting: Family Medicine

## 2018-05-16 MED ORDER — LEVOTHYROXINE SODIUM 25 MCG PO TABS: ORAL_TABLET | ORAL | 1 refills | Status: DC

## 2018-05-26 DIAGNOSIS — H16203 Unspecified keratoconjunctivitis, bilateral: Secondary | ICD-10-CM | POA: Diagnosis not present

## 2018-05-27 ENCOUNTER — Ambulatory Visit: Payer: Medicare HMO | Admitting: Pharmacist Clinician (PhC)/ Clinical Pharmacy Specialist

## 2018-05-27 DIAGNOSIS — Z7901 Long term (current) use of anticoagulants: Secondary | ICD-10-CM

## 2018-05-27 DIAGNOSIS — Z9889 Other specified postprocedural states: Secondary | ICD-10-CM

## 2018-05-27 LAB — POCT INR: INR: 1.3 — AB (ref 2.0–3.0)

## 2018-05-27 NOTE — Patient Instructions (Signed)
Description   Take 2 tablets Friday Jan 3 and Saturday Jan 4, then increase dose to 1.5 tablets daily.  Repeat INR in 10 days

## 2018-06-02 ENCOUNTER — Other Ambulatory Visit: Payer: Self-pay | Admitting: Thoracic Surgery (Cardiothoracic Vascular Surgery)

## 2018-06-02 ENCOUNTER — Ambulatory Visit
Admission: RE | Admit: 2018-06-02 | Discharge: 2018-06-02 | Disposition: A | Discharge status: Home / Self Care | Payer: Medicare HMO | Source: Ambulatory Visit | Attending: Thoracic Surgery (Cardiothoracic Vascular Surgery) | Admitting: Thoracic Surgery (Cardiothoracic Vascular Surgery)

## 2018-06-02 DIAGNOSIS — R918 Other nonspecific abnormal finding of lung field: Secondary | ICD-10-CM | POA: Diagnosis not present

## 2018-06-02 DIAGNOSIS — Z9889 Other specified postprocedural states: Secondary | ICD-10-CM

## 2018-06-03 ENCOUNTER — Other Ambulatory Visit: Payer: Self-pay | Admitting: Thoracic Surgery (Cardiothoracic Vascular Surgery)

## 2018-06-03 DIAGNOSIS — Z9889 Other specified postprocedural states: Secondary | ICD-10-CM

## 2018-06-06 ENCOUNTER — Ambulatory Visit: Payer: Medicare HMO | Admitting: Pharmacist

## 2018-06-06 ENCOUNTER — Other Ambulatory Visit: Payer: Self-pay

## 2018-06-06 ENCOUNTER — Ambulatory Visit (INDEPENDENT_AMBULATORY_CARE_PROVIDER_SITE_OTHER): Payer: Self-pay | Admitting: Thoracic Surgery (Cardiothoracic Vascular Surgery)

## 2018-06-06 ENCOUNTER — Encounter: Payer: Self-pay | Admitting: Thoracic Surgery (Cardiothoracic Vascular Surgery)

## 2018-06-06 VITALS — BP 116/64 | HR 90 | Ht 61.5 in | Wt 147.0 lb

## 2018-06-06 DIAGNOSIS — Z9889 Other specified postprocedural states: Secondary | ICD-10-CM

## 2018-06-06 DIAGNOSIS — Z7901 Long term (current) use of anticoagulants: Secondary | ICD-10-CM

## 2018-06-06 DIAGNOSIS — I340 Nonrheumatic mitral (valve) insufficiency: Secondary | ICD-10-CM

## 2018-06-06 LAB — POCT INR: INR: 1.3 — AB (ref 2.0–3.0)

## 2018-06-06 NOTE — Progress Notes (Signed)
[address]       [place] [address]             [phone]     CARDIOTHORACIC SURGERY OFFICE NOTE  Referring Provider is Martinique, Peter M, MD PCP is Susy Frizzle, MD   HPI:  Patient is a 73 year old female who returns to the office today for routine follow-up approximately 3 months status post minimally invasive mitral valve repair on March 10, 2018 for severe symptomatic primary mitral regurgitation.  The patient's early postoperative recovery was uneventful and she was last seen here in the office on April 04, 2018 at which time she was doing well.  Initially she did have a persistent cough for which she was treated by her primary care physician.  Symptoms resolved fairly quickly.  Approximately 1 month ago she developed some sort of infection in her eye which has been treated by her ophthalmologist and resolved.  She was seen by Dr. Martinique last month at which time she was doing well.  Routine follow-up echocardiogram revealed intact mitral valve repair with no residual mitral regurgitation and low normal left ventricular systolic function.  She returns her office today and reports that she is doing exceptionally well.  She exercises on nearly a daily basis and overall she reports that her breathing is better than it was prior to surgery.  She is back to unrestricted physical activity without any problems or concerns.  She has no significant residual pain in her chest.  Overall she is delighted with her outcome.   Current Outpatient Medications  Medication Sig Dispense Refill  . alendronate (FOSAMAX) 70 MG tablet Take 1 tablet (70 mg total) by mouth every 7 (seven) days. Take with a full glass of water on an empty stomach.Sit upright for 30 minutes after taking 12 tablet 3  . aspirin EC 81 MG EC tablet Take 1 tablet (81 mg total) by mouth daily.    . Biotin 5000 MCG TABS Take 5,000 mcg by mouth daily.     . cetirizine (ZYRTEC) 10 MG tablet Take 1 tablet  (10 mg total) by mouth daily. (Patient taking differently: Take 10 mg by mouth daily as needed for allergies. ) 90 tablet 0  . Coenzyme Q10 (CO Q 10 PO) Take 300 mg by mouth daily.    Marland Kitchen docusate sodium (COLACE) 100 MG capsule Take 100 mg by mouth daily.    . fluticasone (FLONASE) 50 MCG/ACT nasal spray Place 2 sprays into both nostrils daily as needed for allergies or rhinitis. 16 g 6  . levothyroxine (SYNTHROID, LEVOTHROID) 25 MCG tablet TAKE 1 TABLET EVERY DAY BEFORE BREAKFAST 90 tablet 1  . Melatonin 5 MG CAPS Take 10 mg by mouth at bedtime.    . Multiple Vitamin (MULTIVITAMIN) tablet Take 1 tablet by mouth daily. Centrum Silver    . Probiotic Product (PROBIOTIC DAILY PO) Take 1 capsule by mouth daily. Florify    . Propylene Glycol (SYSTANE COMPLETE) 0.6 % SOLN Place 1 drop into both eyes 2 (two) times daily as needed (dry eyes).     . valACYclovir (VALTREX) 1000 MG tablet Take 1,000 mg by mouth daily.  1  . vitamin B-12 (CYANOCOBALAMIN) 1000 MCG tablet Take 1,000 mcg by mouth daily.    Marland Kitchen warfarin (COUMADIN) 4 MG tablet Take 1 to 1.5 tablets by mouth daily as directed by coumadin clinic 45 tablet 3  . zinc gluconate 50 MG tablet Take 50 mg by mouth daily.  No current facility-administered medications for this visit.       Physical Exam:   Ht 5' 1.5" (1.562 m)   Wt 147 lb (66.7 kg)   BMI 27.33 kg/m   General:  Well-appearing  Chest:   Clear to auscultation with symmetrical breath sounds  CV:   Regular rate and rhythm without murmur  Incisions:  Completely healed  Abdomen:  Soft nontender  Extremities:  Warm and well-perfused  Diagnostic Tests:  Transthoracic Echocardiography  Patient:    Maria, Gomez MR #:       [id number] Study Date: 05/12/2018 Gender:     F Age:        21 Height:     156.2 cm Weight:     68.6 kg BSA:        1.75 m^2 Pt. Status: Room:   ORDERING     Peter Martinique, M.D.  REFERRING    Peter Martinique, M.D.  ATTENDING    Kirk Ruths   SONOGRAPHER  [place]  cc:  ------------------------------------------------------------------- LV EF: 50% -   55%  ------------------------------------------------------------------- Indications:      I34.0 Mitral valve regurgitation. F02.637 Status post Mitral valve repair.  ------------------------------------------------------------------- History:   PMH:  Cough. Hypothyroidism.  Mitral valve prolapse.  ------------------------------------------------------------------- Study Conclusions  - Left ventricle: The cavity size was normal. Wall thickness was   normal. Systolic function was normal. The estimated ejection   fraction was in the range of 50% to 55%. Wall motion was normal;   there were no regional wall motion abnormalities. Doppler   parameters are consistent with abnormal left ventricular   relaxation (grade 1 diastolic dysfunction). - Aortic valve: There was trivial regurgitation. - Mitral valve: Prior procedures included surgical repair.  Impressions:  - Low normal LV systolic function; mild diastolic dysfunction;   trace AI; s/p MV repair with no MR.  ------------------------------------------------------------------- Study data:  Comparison was made to the study of 03/10/2018.  Study status:  Routine.  Procedure:  The patient reported no pain pre or post test. Transthoracic echocardiography. Image quality was adequate.  Study completion:  There were no complications. Transthoracic echocardiography.  M-mode, complete 2D, spectral Doppler, and color Doppler.  Birthdate:  Patient birthdate: 22-Jul-1945.  Age:  Patient is 73 yr old.  Sex:  Gender: female. BMI: 28.1 kg/m^2.  Blood pressure:     112/92  Patient status: Outpatient.  Study date:  Study date: 05/12/2018. Study time: 01:18 PM.  Location:  [facility]  3  -------------------------------------------------------------------  ------------------------------------------------------------------- Left ventricle:  The cavity size was normal. Wall thickness was normal. Systolic function was normal. The estimated ejection fraction was in the range of 50% to 55%. Wall motion was normal; there were no regional wall motion abnormalities. Doppler parameters are consistent with abnormal left ventricular relaxation (grade 1 diastolic dysfunction).  ------------------------------------------------------------------- Aortic valve:   Trileaflet; mildly thickened leaflets. Mobility was not restricted.  Doppler:  Transvalvular velocity was within the normal range. There was no stenosis. There was trivial regurgitation.  ------------------------------------------------------------------- Aorta:  Aortic root: The aortic root was normal in size.  ------------------------------------------------------------------- Mitral valve:  Prior procedures included surgical repair. Mobility was not restricted.  Doppler:  Transvalvular velocity was within the normal range. There was no evidence for stenosis. There was trivial regurgitation.    Valve area by pressure half-time: 4.78 cm^2. Indexed valve area by pressure half-time: 2.74 cm^2/m^2. Peak gradient (D): 4 mm Hg.  ------------------------------------------------------------------- Left atrium:  The  atrium was normal in size.  ------------------------------------------------------------------- Right ventricle:  The cavity size was normal. Systolic function was normal.  ------------------------------------------------------------------- Pulmonic valve:    Doppler:  Transvalvular velocity was within the normal range. There was no evidence for stenosis. There was trivial regurgitation.  ------------------------------------------------------------------- Tricuspid valve:   Structurally normal  valve.    Doppler: Transvalvular velocity was within the normal range. There was trivial regurgitation.  ------------------------------------------------------------------- Pulmonary artery:   Systolic pressure was within the normal range.   ------------------------------------------------------------------- Right atrium:  The atrium was normal in size.  ------------------------------------------------------------------- Pericardium:  There was no pericardial effusion.  ------------------------------------------------------------------- Systemic veins: Inferior vena cava: The vessel was normal in size.  ------------------------------------------------------------------- Measurements   Left ventricle                         Value          Reference  LV ID, ED, PLAX chordal        (L)     33    mm       43 - 52  LV ID, ES, PLAX chordal                24    mm       23 - 38  LV fx shortening, PLAX chordal (L)     27    %        >=29  LV PW thickness, ED                    11.43 mm       ----------  IVS/LV PW ratio, ED                    0.98           <=1.3  Stroke volume, 2D                      49    ml       ----------  Stroke volume/bsa, 2D                  28    ml/m^2   ----------  LV e&', lateral                         10.6  cm/s     ----------  LV E/e&', lateral                       9.62           ----------  LV e&', medial                          6.96  cm/s     ----------  LV E/e&', medial                        14.66          ----------  LV e&', average                         8.78  cm/s     ----------  LV E/e&', average                       11.62          ----------  Ventricular septum                     Value          Reference  IVS thickness, ED                      11.19 mm       ----------    LVOT                                   Value          Reference  LVOT ID, S                             20    mm       ----------  LVOT area                               3.14  cm^2     ----------  LVOT peak velocity, S                  82.1  cm/s     ----------  LVOT mean velocity, S                  53.9  cm/s     ----------  LVOT VTI, S                            15.5  cm       ----------    Aorta                                  Value          Reference  Aortic root ID, ED                     26    mm       ----------    Left atrium                            Value          Reference  LA ID, A-P, ES                         28    mm       ----------  LA ID/bsa, A-P                         1.6   cm/m^2   <=2.2  LA volume, S                           33.3  ml       ----------  LA volume/bsa, S                       19.1  ml/m^2   ----------  LA volume, ES, 1-p A4C  39.5  ml       ----------  LA volume/bsa, ES, 1-p A4C             22.6  ml/m^2   ----------  LA volume, ES, 1-p A2C                 27.1  ml       ----------  LA volume/bsa, ES, 1-p A2C             15.5  ml/m^2   ----------    Mitral valve                           Value          Reference  Mitral E-wave peak velocity            102   cm/s     ----------  Mitral A-wave peak velocity            112   cm/s     ----------  Mitral deceleration time               162   ms       150 - 230  Mitral pressure half-time              47    ms       ----------  Mitral peak gradient, D                4     mm Hg    ----------  Mitral E/A ratio, peak                 0.9            ----------  Mitral valve area, PHT, DP             4.78  cm^2     ----------  Mitral valve area/bsa, PHT, DP         2.74  cm^2/m^2 ----------    Pulmonary arteries                     Value          Reference  PA pressure, S, DP                     21    mm Hg    <=30    Tricuspid valve                        Value          Reference  Tricuspid regurg peak velocity         213   cm/s     ----------  Tricuspid peak RV-RA gradient          18    mm Hg    ----------    Right atrium                            Value          Reference  RA ID, S-I, ES, A4C                    48.2  mm       34 - 49  RA area, ES, A4C  12.7  cm^2     8.3 - 19.5  RA volume, ES, A/L                     25.1  ml       ----------  RA volume/bsa, ES, A/L                 14.4  ml/m^2   ----------    Systemic veins                         Value          Reference  Estimated CVP                          3     mm Hg    ----------    Right ventricle                        Value          Reference  RV pressure, S, DP                     21    mm Hg    <=30  RV s&', lateral, S                      9.46  cm/s     ----------  Legend: (L)  and  (H)  mark values outside specified reference range.  ------------------------------------------------------------------- Prepared and Electronically Authenticated by  Kirk Ruths 2019-12-19T14:29:26    CHEST - 2 VIEW  COMPARISON:  04/12/2018  FINDINGS: Normal heart size. Mitral valve repair hardware is in place. Chronic opacity at the right pericardiophrenic angle has improved. This obscures the right heart border and right hemidiaphragm. It may represent loculated pleural fluid or chronic consolidation. There is associated right basilar atelectasis. Left lung is clear. No pneumothorax.  IMPRESSION: Improving opacity at the right pericardiophrenic angle which may represent loculated pleural fluid or chronic consolidation. Consider further imaging with CT.   Electronically Signed   By: Marybelle Killings M.D.   On: 06/02/2018 15:07   Impression:  Patient is doing very well approximately 3 months status post minimally invasive mitral valve repair.  She reports normal exercise tolerance with improved exercise capacity in comparison with prior to surgery.  She is back to normal, unrestricted physical activity.  Routine follow-up echocardiogram looks good with intact mitral valve repair and no residual mitral regurgitation.  Follow-up chest x-ray  reveals small area of opacity at the right hemidiaphragm which undoubtedly is related to the surgical approach and in my opinion is of no concern.    Plan:  I have encouraged the patient to continue to increase her activity without any particular limitations.  She may stop taking Coumadin.  I have advised her to continue taking aspirin 81 mg daily.  She will remain on all other medications without change.  The patient has been reminded regarding the importance of dental hygiene and the lifelong need for antibiotic prophylaxis for all dental cleanings and other related invasive procedures.  The patient will return to our office next fall, approximately 1 year following her surgery.  She will call and return sooner should specific problems or questions arise.    Maria Gomez. Maria Manns, MD 06/06/2018 9:32 AM

## 2018-06-06 NOTE — Patient Instructions (Signed)
You may resume unrestricted physical activity without any particular limitations at this time.  You may stop taking Coumadin (warfarin).  Continue all other previous medications without any changes at this time  Endocarditis is a potentially serious infection of heart valves or inside lining of the heart.  It occurs more commonly in patients with diseased heart valves (such as patient's with aortic or mitral valve disease) and in patients who have undergone heart valve repair or replacement.  Certain surgical and dental procedures may put you at risk, such as dental cleaning, other dental procedures, or any surgery involving the respiratory, urinary, gastrointestinal tract, gallbladder or prostate gland.   To minimize your chances for develooping endocarditis, maintain good oral health and seek prompt medical attention for any infections involving the mouth, teeth, gums, skin or urinary tract.    Always notify your doctor or dentist about your underlying heart valve condition before having any invasive procedures. You will need to take antibiotics before certain procedures, including all routine dental cleanings or other dental procedures.  Your cardiologist or dentist should prescribe these antibiotics for you to be taken ahead of time.

## 2018-06-20 ENCOUNTER — Ambulatory Visit (INDEPENDENT_AMBULATORY_CARE_PROVIDER_SITE_OTHER): Payer: Medicare HMO | Admitting: Family Medicine

## 2018-06-20 VITALS — BP 126/90 | HR 100 | Temp 98.6°F | Resp 14 | Ht 61.5 in | Wt 152.0 lb

## 2018-06-20 DIAGNOSIS — I6523 Occlusion and stenosis of bilateral carotid arteries: Secondary | ICD-10-CM

## 2018-06-20 DIAGNOSIS — E039 Hypothyroidism, unspecified: Secondary | ICD-10-CM | POA: Diagnosis not present

## 2018-06-20 DIAGNOSIS — Z9889 Other specified postprocedural states: Secondary | ICD-10-CM | POA: Diagnosis not present

## 2018-06-20 DIAGNOSIS — M810 Age-related osteoporosis without current pathological fracture: Secondary | ICD-10-CM

## 2018-06-20 DIAGNOSIS — E041 Nontoxic single thyroid nodule: Secondary | ICD-10-CM

## 2018-06-20 DIAGNOSIS — E1069 Type 1 diabetes mellitus with other specified complication: Secondary | ICD-10-CM | POA: Diagnosis not present

## 2018-06-20 DIAGNOSIS — F22 Delusional disorders: Secondary | ICD-10-CM

## 2018-06-20 DIAGNOSIS — R8290 Unspecified abnormal findings in urine: Secondary | ICD-10-CM

## 2018-06-20 DIAGNOSIS — R69 Illness, unspecified: Secondary | ICD-10-CM | POA: Diagnosis not present

## 2018-06-20 LAB — URINALYSIS, ROUTINE W REFLEX MICROSCOPIC
Bilirubin Urine: NEGATIVE
Glucose, UA: NEGATIVE
Ketones, ur: NEGATIVE
Nitrite: NEGATIVE
PH: 7 (ref 5.0–8.0)
Protein, ur: NEGATIVE
Specific Gravity, Urine: 1.015 (ref 1.001–1.03)

## 2018-06-20 LAB — MICROSCOPIC MESSAGE

## 2018-06-20 MED ORDER — CEPHALEXIN 500 MG PO CAPS: 500.0000 mg | ORAL_CAPSULE | Freq: Three times a day (TID) | ORAL | 0 refills | Status: DC

## 2018-06-20 NOTE — Progress Notes (Signed)
Subjective:    Patient ID: Maria Gomez, female    DOB: 1945/06/02, 73 y.o.   MRN: [id number]  HPI  03/2018 Patient had heart surgery approximately 1 month ago to repair mitral valve.  She is done extremely well postoperatively and has been started on Coumadin.  Unfortunately, she is developed a chronic nonproductive cough that she describes as an itch or a tingle in the back of her throat.  It tends to be worse during the day when she is up walking around while she is trying to talk to people or breathing cool air.  He denies any hemoptysis.  She denies any chest pain.  She denies any shortness of breath.  She denies any fevers or chills.  She denies any recent travel or exposures.  She tried Zyrtec on her own as well as Tessalon without any relief.  She denies any heartburn.  At that time, my plan was: Patient's exam is unremarkable.  She is therapeutic on Coumadin and therefore I believe it pulmonary embolism after surgery is unlikely.  Differential diagnosis includes upper airway cough syndrome, chronic bronchitis, laryngo-esophageal reflux, nerve irritation in the throat secondary to intubation causing a chronic cough, infection although unlikely.  Recommended a chest x-ray.  Start the patient on a combination of Flonase 2 sprays each nostril daily for possible postnasal drip coupled with Protonix 40 mg daily for possible silent reflux.  Obtain chest x-ray to rule out infection.  Start tramadol for nerve irritation 50 mg p.o. every 8 hours.  Recheck in 1 week.  If cough persist consider additional work-up or empiric trial of inhaled steroids for possible cough variant asthma  06/20/18 Patient has a history of carotid bruits.  She had carotid ultrasounds performed in October of last year.  The results are dictated below:  Right Carotid: Velocities in the right ICA are consistent with a 1-39% stenosis.  Left Carotid: Velocities in the left ICA are consistent with a 1-39%  stenosis. Vertebrals: Left vertebral artery demonstrates antegrade flow. Right vertebral             artery demonstrates high resistant flow.  She is on aspirin 81 mg a day but not a statin.  Patient has discontinued Coumadin and is now only on aspirin.  On a CT scan obtained by her cardiothoracic surgeon last fall, there was a coincidental finding of a 2.9 cm heterogeneous left thyroid nodule.  They recommended that she follow-up with me to discuss this.  This nodule is not palpable today on exam.  However it certainly meets size criteria for a biopsy.  She also reports a one-week history of dysuria, urgency, frequency.  Urinalysis is significant for blood as well as leukocyte esterase.  She denies any low back pain or fevers or chills or nausea or vomiting.  However at the end of the encounter, her visit took an unusual turn.  She states that her son has not spoken to her since Christmas.  She states that her son says she needs counseling.  When I questioned the patient as to why, she states that it is because she told her son the truth.  When I inquired as to what she was referencing, she states that she told her son how her daughter-in-law's brother is sabotaging her life.  This person works for the [employer] according to the patient.  According to the patient he has hacked into the all the electrical devices in her home.  He has messed up her TV  so that it will not work.  He has messed up her dehumidifier in her basement.  He is tapped her phones.  He is sabotaging her friend's cell phones and making them not work.  He is calling her at odd times and having her called by random telemarketer's.  Because of this individual she is receiving lots of spam mail.  When I questioned the patient as to why he would go to see she links to terrorized her and make her life miserable, she states is because she gave her family herpes.  She lists numerous electronic devices such as the TV the telephone in the dehumidifier the  computers the iPad is the cell phones that he has "hacked into and sabotaged".  When she told her son the truth about what was going on, her daughter-in-law became defensive and they have cut off all communication with her until she gets help.  She denies any suicidal ideation or homicidal ideation    Past Medical History:  Diagnosis Date  . Hypothyroidism   . Mitral regurgitation   . Osteopenia   . S/P minimally invasive mitral valve repair 03/10/2018   Complex valvuloplasty including triangular resection of posterior leaflet, artificial Gore-tex neochord x6 and Size 30 Sorin Memo 4D Ring SN [id number] via right mini thoracotomy approach  . Thyroid nodule 03/07/2018   Incidental - noted on CT scan   Past Surgical History:  Procedure Laterality Date  . BREAST EXCISIONAL BIOPSY Bilateral   . MITRAL VALVE REPAIR Right 03/10/2018   Procedure: MINIMALLY INVASIVE MITRAL VALVE REPAIR (MVR):  -Triangular Resection of Posterior Leaflet P2; -Shortening Plasty of Posterior Leaflet P2; -Placement of Goretex Neo Chords x 6 using Chord-X system; -Ring Annuloplasty with a 30 mm Sorin Memo 4D Ring;  Surgeon: Rexene Alberts, MD;  Location: [facility];  Service: Open Heart Surgery;  Laterality: Right;  . RIGHT/LEFT HEART CATH AND CORONARY ANGIOGRAPHY N/A 01/18/2018   Procedure: RIGHT/LEFT HEART CATH AND CORONARY ANGIOGRAPHY;  Surgeon: Martinique, Peter M, MD;  Location: [place] [facility];  Service: Cardiovascular;  Laterality: N/A;  . TEE WITHOUT CARDIOVERSION N/A 11/09/2017   Procedure: TRANSESOPHAGEAL ECHOCARDIOGRAM (TEE);  Surgeon: Larey Dresser, MD;  Location: [facility] ENDOSCOPY;  Service: Cardiovascular;  Laterality: N/A;  . TEE WITHOUT CARDIOVERSION N/A 03/10/2018   Procedure: TRANSESOPHAGEAL ECHOCARDIOGRAM (TEE);  Surgeon: Rexene Alberts, MD;  Location: [place];  Service: Open Heart Surgery;  Laterality: N/A;   Current Outpatient Medications on File Prior to Visit  Medication Sig Dispense Refill  . alendronate  (FOSAMAX) 70 MG tablet Take 1 tablet (70 mg total) by mouth every 7 (seven) days. Take with a full glass of water on an empty stomach.Sit upright for 30 minutes after taking 12 tablet 3  . aspirin EC 81 MG EC tablet Take 1 tablet (81 mg total) by mouth daily.    . Biotin 5000 MCG TABS Take 5,000 mcg by mouth daily.     . cetirizine (ZYRTEC) 10 MG tablet Take 1 tablet (10 mg total) by mouth daily. (Patient taking differently: Take 10 mg by mouth daily as needed for allergies. ) 90 tablet 0  . Coenzyme Q10 (CO Q 10 PO) Take 300 mg by mouth daily.    Marland Kitchen docusate sodium (COLACE) 100 MG capsule Take 100 mg by mouth daily.    . fluticasone (FLONASE) 50 MCG/ACT nasal spray Place 2 sprays into both nostrils daily as needed for allergies or rhinitis. 16 g 6  . levothyroxine (SYNTHROID, LEVOTHROID)  25 MCG tablet TAKE 1 TABLET EVERY DAY BEFORE BREAKFAST 90 tablet 1  . Melatonin 5 MG CAPS Take 10 mg by mouth at bedtime.    . Multiple Vitamin (MULTIVITAMIN) tablet Take 1 tablet by mouth daily. Centrum Silver    . Probiotic Product (PROBIOTIC DAILY PO) Take 1 capsule by mouth daily. Florify    . Propylene Glycol (SYSTANE COMPLETE) 0.6 % SOLN Place 1 drop into both eyes 2 (two) times daily as needed (dry eyes).     . valACYclovir (VALTREX) 1000 MG tablet Take 1,000 mg by mouth daily.  1  . vitamin B-12 (CYANOCOBALAMIN) 1000 MCG tablet Take 1,000 mcg by mouth daily.    Marland Kitchen zinc gluconate 50 MG tablet Take 50 mg by mouth daily.     No current facility-administered medications on file prior to visit.    Allergies  Allergen Reactions  . Niaspan [Niacin Er] Other (See Comments)    Severe flushing  . Phenazopyridine Hcl Other (See Comments)    Made sick   . Statins     Severe flushing   Social History   Socioeconomic History  . Marital status: Widowed    Spouse name: Not on file  . Number of children: Not on file  . Years of education: Not on file  . Highest education level: Not on file  Occupational  History  . Not on file  Social Needs  . Financial resource strain: Not on file  . Food insecurity:    Worry: Not on file    Inability: Not on file  . Transportation needs:    Medical: Not on file    Non-medical: Not on file  Tobacco Use  . Smoking status: Never Smoker  . Smokeless tobacco: Never Used  Substance and Sexual Activity  . Alcohol use: No  . Drug use: No  . Sexual activity: Not on file  Lifestyle  . Physical activity:    Days per week: Not on file    Minutes per session: Not on file  . Stress: Not on file  Relationships  . Social connections:    Talks on phone: Not on file    Gets together: Not on file    Attends religious service: Not on file    Active member of club or organization: Not on file    Attends meetings of clubs or organizations: Not on file    Relationship status: Not on file  . Intimate partner violence:    Fear of current or ex partner: Not on file    Emotionally abused: Not on file    Physically abused: Not on file    Forced sexual activity: Not on file  Other Topics Concern  . Not on file  Social History Narrative   Lives with her mom.    Takes care of aunt who lives across the street.    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            Review of Systems  All other systems reviewed and are negative.      Objective:   Physical Exam Vitals signs reviewed.  HENT:     Mouth/Throat:     Pharynx: No oropharyngeal exudate.  Neck:     Musculoskeletal: Neck supple.     Thyroid: No thyromegaly.     Vascular: No JVD.     Trachea: No tracheal deviation.  Cardiovascular:     Rate and Rhythm: Normal rate and  regular rhythm.     Heart sounds: Murmur present.  Pulmonary:     Effort: Pulmonary effort is normal. No respiratory distress.     Breath sounds: Normal breath sounds. No stridor. No wheezing or rales.  Abdominal:     General: Bowel sounds are normal.     Palpations: Abdomen is soft.  Lymphadenopathy:      Cervical: No cervical adenopathy.           Assessment & Plan:  S/P minimally invasive mitral valve repair  Hypothyroidism, unspecified type - Plan: CBC with Differential/Platelet, COMPLETE METABOLIC PANEL WITH GFR, TSH  Osteoporosis without current pathological fracture, unspecified osteoporosis type  Carotid stenosis, asymptomatic, bilateral - Plan: CBC with Differential/Platelet, COMPLETE METABOLIC PANEL WITH GFR, Lipid panel  Type 1 diabetes mellitus with other specified complication (HCC)  Urine abnormality - Plan: Urinalysis, Routine w reflex microscopic  Thyroid nodule, cold - Plan: US THYROID  Paranoid delusion ([place]) - Plan: Ambulatory referral to Psychiatry  Patient is due for lab work.  I have recommended that she return fasting for a CBC, CMP, fasting lipid panel, and a TSH.  Her goal LDL cholesterol is less than 70 given her carotid stenosis.  She is taking aspirin.  I want to ensure that her levothyroxine dose is appropriate.  I will treat her urinary tract infection with Keflex 500 mg p.o. 3 times daily for 1 week.  I will schedule the patient for a thyroid ultrasound to confirm the presence of the thyroid nodule.  If present and greater than 1 cm it will need a fine-needle aspiration to rule out thyroid malignancy.  However I spent more than 35 minutes with the patient today discussing her paranoid delusions about her extended family "sabotaging all the electrical devices in her home".  The story seems farfetched.  The patient states that she is not crazy.  She has insight into how while the story sounds however she is convinced that he is doing all these things in an effort to terrorized her and punish her.  This is definitely a fixed false belief and qualifies as delusional.  Therefore I have recommended psychiatry consultation.  I will arrange this for the patient.

## 2018-06-22 ENCOUNTER — Other Ambulatory Visit: Payer: Medicare HMO

## 2018-06-22 DIAGNOSIS — I6523 Occlusion and stenosis of bilateral carotid arteries: Secondary | ICD-10-CM | POA: Diagnosis not present

## 2018-06-22 DIAGNOSIS — E039 Hypothyroidism, unspecified: Secondary | ICD-10-CM | POA: Diagnosis not present

## 2018-06-22 LAB — TSH: TSH: 2.82 mIU/L (ref 0.40–4.50)

## 2018-06-22 LAB — LIPID PANEL
Cholesterol: 252 mg/dL — ABNORMAL HIGH (ref <200)
HDL: 50 mg/dL — ABNORMAL LOW (ref >50)
LDL Cholesterol (Calc): 166 mg/dL (calc) — ABNORMAL HIGH
Non-HDL Cholesterol (Calc): 202 mg/dL (calc) — ABNORMAL HIGH (ref <130)
Total CHOL/HDL Ratio: 5 (calc) — ABNORMAL HIGH (ref <5.0)
Triglycerides: 203 mg/dL — ABNORMAL HIGH (ref <150)

## 2018-06-22 LAB — COMPLETE METABOLIC PANEL WITH GFR
AG Ratio: 1.7 (calc) (ref 1.0–2.5)
ALKALINE PHOSPHATASE (APISO): 65 U/L (ref 33–130)
ALT: 11 U/L (ref 6–29)
AST: 14 U/L (ref 10–35)
Albumin: 4.2 g/dL (ref 3.6–5.1)
BILIRUBIN TOTAL: 0.4 mg/dL (ref 0.2–1.2)
BUN/Creatinine Ratio: 13 (calc) (ref 6–22)
BUN: 14 mg/dL (ref 7–25)
CO2: 25 mmol/L (ref 20–32)
CREATININE: 1.05 mg/dL — AB (ref 0.60–0.93)
Calcium: 9.8 mg/dL (ref 8.6–10.4)
Chloride: 105 mmol/L (ref 98–110)
GFR, EST AFRICAN AMERICAN: 61 mL/min/{1.73_m2} (ref >=60)
GFR, Est Non African American: 53 mL/min/{1.73_m2} — ABNORMAL LOW (ref >=60)
GLUCOSE: 96 mg/dL (ref 65–99)
Globulin: 2.5 g/dL (calc) (ref 1.9–3.7)
POTASSIUM: 4.5 mmol/L (ref 3.5–5.3)
Sodium: 141 mmol/L (ref 135–146)
TOTAL PROTEIN: 6.7 g/dL (ref 6.1–8.1)

## 2018-06-22 LAB — CBC WITH DIFFERENTIAL/PLATELET
Absolute Monocytes: 428 cells/uL (ref 200–950)
BASOS PCT: 0.6 %
Basophils Absolute: 37 cells/uL (ref 0–200)
EOS ABS: 81 {cells}/uL (ref 15–500)
EOS PCT: 1.3 %
HCT: 42.7 % (ref 35.0–45.0)
HEMOGLOBIN: 14.3 g/dL (ref 11.7–15.5)
Lymphs Abs: 1693 cells/uL (ref 850–3900)
MCH: 31.6 pg (ref 27.0–33.0)
MCHC: 33.5 g/dL (ref 32.0–36.0)
MCV: 94.3 fL (ref 80.0–100.0)
MONOS PCT: 6.9 %
MPV: 10.9 fL (ref 7.5–12.5)
NEUTROS ABS: 3962 {cells}/uL (ref 1500–7800)
Neutrophils Relative %: 63.9 %
Platelets: 272 10*3/uL (ref 140–400)
RBC: 4.53 10*6/uL (ref 3.80–5.10)
RDW: 15 % (ref 11.0–15.0)
TOTAL LYMPHOCYTE: 27.3 %
WBC: 6.2 10*3/uL (ref 3.8–10.8)

## 2018-06-23 ENCOUNTER — Ambulatory Visit
Admission: RE | Admit: 2018-06-23 | Discharge: 2018-06-23 | Disposition: A | Discharge status: Home / Self Care | Payer: Medicare HMO | Source: Ambulatory Visit | Attending: Family Medicine | Admitting: Family Medicine

## 2018-06-23 ENCOUNTER — Other Ambulatory Visit: Payer: Self-pay | Admitting: Family Medicine

## 2018-06-23 DIAGNOSIS — E041 Nontoxic single thyroid nodule: Secondary | ICD-10-CM

## 2018-06-23 DIAGNOSIS — E042 Nontoxic multinodular goiter: Secondary | ICD-10-CM | POA: Diagnosis not present

## 2018-06-23 MED ORDER — ATORVASTATIN CALCIUM 20 MG PO TABS: 20.0000 mg | ORAL_TABLET | Freq: Every day | ORAL | 1 refills | Status: DC

## 2018-06-24 ENCOUNTER — Other Ambulatory Visit: Payer: Self-pay | Admitting: *Deleted

## 2018-06-24 DIAGNOSIS — E039 Hypothyroidism, unspecified: Secondary | ICD-10-CM

## 2018-06-24 DIAGNOSIS — E041 Nontoxic single thyroid nodule: Secondary | ICD-10-CM

## 2018-06-28 ENCOUNTER — Other Ambulatory Visit: Payer: Self-pay | Admitting: Family Medicine

## 2018-06-28 DIAGNOSIS — E041 Nontoxic single thyroid nodule: Secondary | ICD-10-CM

## 2018-06-28 DIAGNOSIS — R5381 Other malaise: Secondary | ICD-10-CM

## 2018-06-29 ENCOUNTER — Ambulatory Visit
Admission: RE | Admit: 2018-06-29 | Discharge: 2018-06-29 | Disposition: A | Discharge status: Home / Self Care | Payer: Medicare HMO | Source: Ambulatory Visit | Attending: Family Medicine | Admitting: Family Medicine

## 2018-06-29 ENCOUNTER — Other Ambulatory Visit (HOSPITAL_COMMUNITY)
Admission: RE | Admit: 2018-06-29 | Discharge: 2018-06-29 | Disposition: A | Discharge status: Home / Self Care | Payer: Medicare HMO | Source: Ambulatory Visit | Attending: Student | Admitting: Student

## 2018-06-29 DIAGNOSIS — E041 Nontoxic single thyroid nodule: Secondary | ICD-10-CM

## 2018-06-29 DIAGNOSIS — E079 Disorder of thyroid, unspecified: Secondary | ICD-10-CM | POA: Diagnosis not present

## 2018-06-29 DIAGNOSIS — D440 Neoplasm of uncertain behavior of thyroid gland: Secondary | ICD-10-CM | POA: Insufficient documentation

## 2018-07-01 ENCOUNTER — Encounter: Payer: Self-pay | Admitting: Family Medicine

## 2018-07-01 ENCOUNTER — Other Ambulatory Visit: Payer: Self-pay | Admitting: Family Medicine

## 2018-07-01 DIAGNOSIS — Z1231 Encounter for screening mammogram for malignant neoplasm of breast: Secondary | ICD-10-CM

## 2018-07-02 DIAGNOSIS — E041 Nontoxic single thyroid nodule: Secondary | ICD-10-CM | POA: Diagnosis not present

## 2018-07-05 DIAGNOSIS — H25013 Cortical age-related cataract, bilateral: Secondary | ICD-10-CM | POA: Diagnosis not present

## 2018-07-05 DIAGNOSIS — H04123 Dry eye syndrome of bilateral lacrimal glands: Secondary | ICD-10-CM | POA: Diagnosis not present

## 2018-07-05 DIAGNOSIS — H524 Presbyopia: Secondary | ICD-10-CM | POA: Diagnosis not present

## 2018-07-13 ENCOUNTER — Encounter (HOSPITAL_COMMUNITY): Payer: Self-pay

## 2018-07-18 DIAGNOSIS — Z0100 Encounter for examination of eyes and vision without abnormal findings: Secondary | ICD-10-CM | POA: Diagnosis not present

## 2018-08-06 NOTE — Progress Notes (Deleted)
Cardiology Office Note    Date:  08/06/2018   ID:  Maria, Gomez 31-Jan-1946, MRN [id number]  PCP:  Maria Frizzle, MD  Cardiologist:  Dr. Martinique  No chief complaint on file.   History of Present Illness:  Maria Gomez is a 73 y.o. female seen for followup Mitral insufficiency. She was intially evaluated by Almyra Deforest PA-C on 11/03/17 for evaluation of a heart murmur. She has a  PMH of hypothyroidism and HLD but no prior cardiac history.  On the previous office visit with his primary care provider in March 2018, a heart murmur was heard.  Echocardiogram obtained on 09/01/2016 showed EF 60 to 65%, mild LVH with moderate focal basal septal hypertrophy, grade 1 DD, thickened mitral leaflet with posterior leaflet prolapse, mild MR, moderate LAE, PA peak pressure was 21 mmHg.  Carotid Doppler obtained on 09/04/2016 showed no significant disease bilaterally.  Echocardiogram was repeated on 09/20/2017, this showed EF 55 to 60%, bileaflet prolapse of mitral valve with at least moderate to severe MR directed anteriorly into the left atrium, PA peak pressure 35 mmHg. She subsequently underwent TEE showing bileaflet MVP with a flail P2 segment and severe MR. She subsequently underwent [facility] on 8/27 confirming severe MR. Normal coronary arteries. Normal right heart pressures.   She had minimally invasive mitral valve repair on 03/10/2018 by Dr. Roxy Manns. She was started on  coumadin. following her surgery she did develop a cough. She was seen by Dr. Dennard Schaumann and cough resolved with tessalon perles and antacid therapy. Repeat Echo in December showed an excellent repair.  She denies any chest pain, dyspnea, edema, palpitations.   Past Medical History:  Diagnosis Date  . Hypothyroidism   . Mitral regurgitation   . Osteopenia   . S/P minimally invasive mitral valve repair 03/10/2018   Complex valvuloplasty including triangular resection of posterior leaflet, artificial Gore-tex neochord x6 and  Size 30 Sorin Memo 4D Ring SN [id number] via right mini thoracotomy approach  . Thyroid nodule 03/07/2018   Incidental - noted on CT scan, atypical cells found on fine-needle aspiration.  General surgery consultation and excision recommended February 2020    Past Surgical History:  Procedure Laterality Date  . BREAST EXCISIONAL BIOPSY Bilateral   . MITRAL VALVE REPAIR Right 03/10/2018   Procedure: MINIMALLY INVASIVE MITRAL VALVE REPAIR (MVR):  -Triangular Resection of Posterior Leaflet P2; -Shortening Plasty of Posterior Leaflet P2; -Placement of Goretex Neo Chords x 6 using Chord-X system; -Ring Annuloplasty with a 30 mm Sorin Memo 4D Ring;  Surgeon: Rexene Alberts, MD;  Location: [place];  Service: Open Heart Surgery;  Laterality: Right;  . RIGHT/LEFT HEART CATH AND CORONARY ANGIOGRAPHY N/A 01/18/2018   Procedure: RIGHT/LEFT HEART CATH AND CORONARY ANGIOGRAPHY;  Surgeon: Martinique,  M, MD;  Location: [place] [facility];  Service: Cardiovascular;  Laterality: N/A;  . TEE WITHOUT CARDIOVERSION N/A 11/09/2017   Procedure: TRANSESOPHAGEAL ECHOCARDIOGRAM (TEE);  Surgeon: Larey Dresser, MD;  Location: [facility] ENDOSCOPY;  Service: Cardiovascular;  Laterality: N/A;  . TEE WITHOUT CARDIOVERSION N/A 03/10/2018   Procedure: TRANSESOPHAGEAL ECHOCARDIOGRAM (TEE);  Surgeon: Rexene Alberts, MD;  Location: [place];  Service: Open Heart Surgery;  Laterality: N/A;    Current Medications: Outpatient Medications Prior to Visit  Medication Sig Dispense Refill  . alendronate (FOSAMAX) 70 MG tablet Take 1 tablet (70 mg total) by mouth every 7 (seven) days. Take with a full glass of water on an empty stomach.Sit upright for 30 minutes  after taking 12 tablet 3  . aspirin EC 81 MG EC tablet Take 1 tablet (81 mg total) by mouth daily.    Marland Kitchen atorvastatin (LIPITOR) 20 MG tablet Take 1 tablet (20 mg total) by mouth daily. 90 tablet 1  . Biotin 5000 MCG TABS Take 5,000 mcg by mouth daily.     . cephALEXin (KEFLEX) 500 MG  capsule Take 1 capsule (500 mg total) by mouth 3 (three) times daily. 21 capsule 0  . cetirizine (ZYRTEC) 10 MG tablet Take 1 tablet (10 mg total) by mouth daily. (Patient taking differently: Take 10 mg by mouth daily as needed for allergies. ) 90 tablet 0  . Coenzyme Q10 (CO Q 10 PO) Take 300 mg by mouth daily.    Marland Kitchen docusate sodium (COLACE) 100 MG capsule Take 100 mg by mouth daily.    . fluticasone (FLONASE) 50 MCG/ACT nasal spray Place 2 sprays into both nostrils daily as needed for allergies or rhinitis. 16 g 6  . levothyroxine (SYNTHROID, LEVOTHROID) 25 MCG tablet TAKE 1 TABLET EVERY DAY BEFORE BREAKFAST 90 tablet 1  . Melatonin 5 MG CAPS Take 10 mg by mouth at bedtime.    . Multiple Vitamin (MULTIVITAMIN) tablet Take 1 tablet by mouth daily. Centrum Silver    . Polyethylene Glycol 400 (BLINK TEARS) 0.25 % SOLN Apply to eye.    . Probiotic Product (PROBIOTIC DAILY PO) Take 1 capsule by mouth daily. Florify    . vitamin B-12 (CYANOCOBALAMIN) 1000 MCG tablet Take 1,000 mcg by mouth daily.    Marland Kitchen zinc gluconate 50 MG tablet Take 50 mg by mouth daily.     No facility-administered medications prior to visit.      Allergies:   Niaspan [niacin er]; Phenazopyridine hcl; and Statins   Social History   Socioeconomic History  . Marital status: Widowed    Spouse name: Not on file  . Number of children: Not on file  . Years of education: Not on file  . Highest education level: Not on file  Occupational History  . Not on file  Social Needs  . Financial resource strain: Not on file  . Food insecurity:    Worry: Not on file    Inability: Not on file  . Transportation needs:    Medical: Not on file    Non-medical: Not on file  Tobacco Use  . Smoking status: Never Smoker  . Smokeless tobacco: Never Used  Substance and Sexual Activity  . Alcohol use: No  . Drug use: No  . Sexual activity: Not on file  Lifestyle  . Physical activity:    Days per week: Not on file    Minutes per session:  Not on file  . Stress: Not on file  Relationships  . Social connections:    Talks on phone: Not on file    Gets together: Not on file    Attends religious service: Not on file    Active member of club or organization: Not on file    Attends meetings of clubs or organizations: Not on file    Relationship status: Not on file  Other Topics Concern  . Not on file  Social History Narrative   Lives with her mom.    Takes care of aunt who lives across the street.    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           Family History:  The patient's family  history includes Cancer (age of onset: 54) in her brother; Cancer (age of onset: 19) in her maternal grandmother; Heart disease in her father; Stroke in her paternal grandfather; Stroke (age of onset: 63) in her maternal grandfather.   ROS:   Please see the history of present illness.    ROS All other systems reviewed and are negative.   PHYSICAL EXAM:   VS:  There were no vitals taken for this visit.   GENERAL:  Well appearing overweight WF in NAD HEENT:  PERRL, EOMI, sclera are clear. Oropharynx is clear. NECK:  No jugular venous distention, carotid upstroke brisk and symmetric, no bruits, no thyromegaly or adenopathy LUNGS:  Clear to auscultation bilaterally CHEST:  Unremarkable HEART:  RRR,  PMI not displaced or sustained,S1 and S2 within normal limits, no S3, no S4: no clicks, no rubs, gr 1/6 systolic murmur at the apex. ABD:  Soft, nontender. BS +, no masses or bruits. No hepatomegaly, no splenomegaly EXT:  2 + pulses throughout, no edema, no cyanosis no clubbing. Radial cath site has healed well. SKIN:  Warm and dry.  No rashes NEURO:  Alert and oriented x 3. Cranial nerves II through XII intact. PSYCH:  Cognitively intact    Wt Readings from Last 3 Encounters:  06/20/18 152 lb (68.9 kg)  06/06/18 147 lb (66.7 kg)  05/04/18 151 lb 3.2 oz (68.6 kg)      Studies/Labs Reviewed:   EKG:  EKG is not  ordered today.    Recent Labs: 03/11/2018: Magnesium 2.6 06/22/2018: ALT 11; BUN 14; Creat 1.05; Hemoglobin 14.3; Platelets 272; Potassium 4.5; Sodium 141; TSH 2.82   Lipid Panel    Component Value Date/Time   CHOL 252 (H) 06/22/2018 0847   TRIG 203 (H) 06/22/2018 0847   HDL 50 (L) 06/22/2018 0847   CHOLHDL 5.0 (H) 06/22/2018 0847   VLDL 31 (H) 08/11/2016 0857   LDLCALC 166 (H) 06/22/2018 0847    Additional studies/ records that were reviewed today include:   Echo 09/01/2016 LV EF: 60% -   65% Study Conclusions  - Left ventricle: The cavity size was normal. Wall thickness was   increased in a pattern of mild LVH. There was moderate focal   basal hypertrophy of the septum. Systolic function was normal.   The estimated ejection fraction was in the range of 60% to 65%.   Wall motion was normal; there were no regional wall motion   abnormalities. Doppler parameters are consistent with abnormal   left ventricular relaxation (grade 1 diastolic dysfunction). The   E/e&' ratio is >15, suggesting elevated LV filling pressure. - Aortic valve: Trileaflet. Sclerosis without stenosis. There was   no regurgitation. - Mitral valve: Thickened leaflets with posterior leaflet prolapse.   Trace to mild regurgitation. - Left atrium: The atrium was moderately dilated. - Tricuspid valve: There was trivial regurgitation. - Pulmonary arteries: PA peak pressure: 21 mm Hg (S). - Inferior vena cava: The vessel was normal in size. The   respirophasic diameter changes were in the normal range (>= 50%),   consistent with normal central venous pressure.  Impressions:  - LVEF 60-65%, mild LVH with moderate focal basal septal   hypertrophy, normal wall motion, grade 1 DD with elevated LV   filling pressure, aortic valve sclerosis, posterior mitral valve   leaflet prolapse with mild to moderate anteriorly directed mitral   regurgitation, moderate LAE, trivial TR, RVSP 21 mmHg, normal   IVC.   Echo  09/20/2017 LV EF: 55% -  60% Study Conclusions  - Left ventricle: The cavity size was normal. Wall thickness was   increased in a pattern of mild LVH. Systolic function was normal.   The estimated ejection fraction was in the range of 55% to 60%. - Mitral valve: There is bileaflet prolapse of the MV MR is   directed anteirorly into the left atrium IT is at least   moderately severe . - Left atrium: The atrium was mildly dilated. - Pulmonary arteries: PA peak pressure: 35 mm Hg (S).  TEE 11/09/17: Study Conclusions  - Left ventricle: The cavity size was normal. Wall thickness was   normal. The estimated ejection fraction was 60%. Wall motion was   normal; there were no regional wall motion abnormalities. - Aortic valve: There was no stenosis. There was trivial   regurgitation. - Aorta: Normal caliber aorta with minimal plaque. - Left atrium: The atrium was mildly dilated. No evidence of   thrombus in the atrial cavity or appendage. - Right ventricle: The cavity size was normal. Systolic function   was normal. - Right atrium: No evidence of thrombus in the atrial cavity or   appendage. - Atrial septum: No ASD/PFO by color doppler. - Tricuspid valve: Peak RV-RA gradient (S): 18 mm Hg. - Impressions: Severe mitral regurgitation in setting of bileaflet   MVP and partial flail P2 scallop.  Impressions:  - Severe mitral regurgitation in setting of bileaflet MVP and   partial flail P2 scallop.  Cardiac cath 01/18/18: Procedures   RIGHT/LEFT HEART CATH AND CORONARY ANGIOGRAPHY  Conclusion     The left ventricular systolic function is normal.  LV end diastolic pressure is normal.  The left ventricular ejection fraction is 55-65% by visual estimate.  There is severe (4+) mitral regurgitation and moderate mitral valve prolapse.   1. Normal coronary anatomy 2. Normal LV function 3. Mitral valve prolapse with severe MR 3-4+ 4. Normal right heart pressures 5. Normal LV  filling pressures  No indication for antiplatelet therapy at this time.   Will refer to Dr. Roxy Manns for consideration of MV repair    Minimally-Invasive Mitral Valve Repair Complex valvuloplasty including triangular resection of flail segment (P2) of posterior leaflet Shortening plasty of posterior leaflet Artificial Gore-tex neochord placement x6 Sorin Memo 4D Ring Annuloplasty (size 27mm, catalog #[id number], serial #[id number])  Echo 05/12/18: Study Conclusions  - Left ventricle: The cavity size was normal. Wall thickness was   normal. Systolic function was normal. The estimated ejection   fraction was in the range of 50% to 55%. Wall motion was normal;   there were no regional wall motion abnormalities. Doppler   parameters are consistent with abnormal left ventricular   relaxation (grade 1 diastolic dysfunction). - Aortic valve: There was trivial regurgitation. - Mitral valve: Prior procedures included surgical repair.  Impressions:  - Low normal LV systolic function; mild diastolic dysfunction;   trace AI; s/p MV repair with no MR.  ASSESSMENT:    No diagnosis found.   PLAN:  In order of problems listed above:  1. Mitral valve regurgitation secondary to partially flail P2 segment of the MV with MV prolapse. MR is severe by TEE with jet directed anteriorly. This has increased compared to Echo done in April 2018. Now s/p MV repair on 03/10/18. She has made an excellent recovery. Repeat Echo showed an excellent repair.   2. Hyperlipidemia: mild. For now recommend lifestyle/diet modification.  3. Hypothyroidism: Managed by primary care provider  4.   Eye infection- managed  by ophthalmology. Patient notes marked improvement.     Medication Adjustments/Labs and Tests Ordered: Current medicines are reviewed at length with the patient today.  Concerns regarding medicines are outlined above.  Medication changes, Labs and Tests  ordered today are listed in the Patient Instructions below. There are no Patient Instructions on file for this visit.   Signed,  Martinique, MD  08/06/2018 4:50 PM    [facility] [address], [place], [place]  [address] Phone: [phone]; Fax: [phone]

## 2018-08-08 ENCOUNTER — Ambulatory Visit: Payer: Medicare HMO | Admitting: Cardiology

## 2018-08-10 ENCOUNTER — Telehealth: Payer: Self-pay

## 2018-08-10 NOTE — Telephone Encounter (Signed)
lmtcb

## 2018-08-10 NOTE — Telephone Encounter (Signed)
   Cardiac Questionnaire:    Since your last visit or hospitalization:    1. Have you been having new or worsening chest pain? No   2. Have you been having new or worsening shortness of breath?  NO 3. Have you been having new or worsening leg swelling, wt gain, or increase in abdominal girth (pants fitting more tightly)? NO   4. Have you had any passing out spells? NO    *A YES to any of these questions would result in the appointment being kept. *If all the answers to these questions are NO, we should indicate that given the current situation regarding the worldwide coronarvirus pandemic, at the recommendation of the CDC, we are looking to limit gatherings in our waiting area, and thus will reschedule their appointment beyond four weeks from today.      Patient cancelled appointment.

## 2018-08-11 ENCOUNTER — Ambulatory Visit: Payer: Medicare HMO | Admitting: Cardiology

## 2018-08-15 ENCOUNTER — Ambulatory Visit: Payer: Medicare HMO

## 2018-08-29 ENCOUNTER — Telehealth: Payer: Self-pay | Admitting: Cardiology

## 2018-08-29 ENCOUNTER — Other Ambulatory Visit: Payer: Self-pay | Admitting: *Deleted

## 2018-08-29 MED ORDER — ALENDRONATE SODIUM 70 MG PO TABS: 70.0000 mg | ORAL_TABLET | ORAL | 3 refills | Status: DC

## 2018-08-29 NOTE — Telephone Encounter (Signed)
Called patient and LM to call the office to reschedule Dr. Doug Sou appt.

## 2018-09-05 ENCOUNTER — Telehealth: Payer: Self-pay | Admitting: Cardiology

## 2018-09-05 NOTE — Progress Notes (Signed)
Virtual Visit via Telephone Note   This visit type was conducted due to national recommendations for restrictions regarding the COVID-19 Pandemic (e.g. social distancing) in an effort to limit this patient's exposure and mitigate transmission in our community.  Due to her co-morbid illnesses, this patient is at least at moderate risk for complications without adequate follow up.  This format is felt to be most appropriate for this patient at this time.  The patient did not have access to video technology/had technical difficulties with video requiring transitioning to audio format only (telephone).  All issues noted in this document were discussed and addressed.  No physical exam could be performed with this format.  Please refer to the patient's chart for her  consent to telehealth for [facility]   Evaluation Performed:  Follow-up visit  Date:  09/06/2018   ID:  Maria, Gomez 1946/01/24, MRN [id number]  Patient Location: Home  Provider Location: Home  PCP:  Susy Frizzle, MD  Cardiologist:  Peter Martinique, MD Electrophysiologist:  None   Chief Complaint:  F/u mitral regurgitation  History of Present Illness:    Maria Gomez is a 73 y.o. female who presents via audio/video conferencing for a telehealth visit today.  She has a history of mitral insufficiency. Echocardiogram obtained on 09/01/2016 showed EF 60 to 65%, mild LVH with moderate focal basal septal hypertrophy, grade 1 DD, thickened mitral leaflet with posterior leaflet prolapse, mild MR, moderate LAE, PA peak pressure was 21 mmHg.  Carotid Doppler obtained on 09/04/2016 showed no significant disease bilaterally.  Echocardiogram was repeated on 09/20/2017, this showed EF 55 to 60%, bileaflet prolapse of mitral valve with at least moderate to severe MR directed anteriorly into the left atrium, PA peak pressure 35 mmHg. She subsequently underwent TEE showing bileaflet MVP with a flail P2 segment and severe MR. She  subsequently underwent [facility] on 8/27 confirming severe MR. Normal coronary arteries. Normal right heart pressures.   She had minimally invasive mitral valve repair on 03/10/2018 by Dr. Roxy Manns. She was started on  coumadin which was later switched to ASA in January. Follow up Echo was satisfactory.   She was started on a statin in January by Dr.Pickard. she is tolerating this well. She denies any chest pain, dyspnea, palpitations, or dizziness. No residual from her surgery. 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 The patient does not have symptoms concerning for COVID-19 infection (fever, chills, cough, or new shortness of breath).    Past Medical History:  Diagnosis Date  . Hypothyroidism   . Mitral regurgitation   . Osteopenia   . S/P minimally invasive mitral valve repair 03/10/2018   Complex valvuloplasty including triangular resection of posterior leaflet, artificial Gore-tex neochord x6 and Size 30 Sorin Memo 4D Ring SN [id number] via right mini thoracotomy approach  . Thyroid nodule 03/07/2018   Incidental - noted on CT scan, atypical cells found on fine-needle aspiration.  General surgery consultation and excision recommended February 2020   Past Surgical History:  Procedure Laterality Date  . BREAST EXCISIONAL BIOPSY Bilateral   . MITRAL VALVE REPAIR Right 03/10/2018   Procedure: MINIMALLY INVASIVE MITRAL VALVE REPAIR (MVR):  -Triangular Resection of Posterior Leaflet P2; -Shortening Plasty of Posterior Leaflet P2; -Placement of Goretex Neo Chords x 6 using Chord-X system; -Ring Annuloplasty with a 30 mm Sorin Memo 4D Ring;  Surgeon: Rexene Alberts, MD;  Location: [facility];  Service: Open Heart  Surgery;  Laterality: Right;  . RIGHT/LEFT HEART CATH AND CORONARY ANGIOGRAPHY N/A 01/18/2018   Procedure: RIGHT/LEFT HEART CATH AND CORONARY ANGIOGRAPHY;  Surgeon: Martinique, Peter M, MD;  Location: [place] [facility];  Service: Cardiovascular;  Laterality: N/A;  . TEE  WITHOUT CARDIOVERSION N/A 11/09/2017   Procedure: TRANSESOPHAGEAL ECHOCARDIOGRAM (TEE);  Surgeon: Larey Dresser, MD;  Location: [facility] ENDOSCOPY;  Service: Cardiovascular;  Laterality: N/A;  . TEE WITHOUT CARDIOVERSION N/A 03/10/2018   Procedure: TRANSESOPHAGEAL ECHOCARDIOGRAM (TEE);  Surgeon: Rexene Alberts, MD;  Location: [facility];  Service: Open Heart Surgery;  Laterality: N/A;     Current Meds  Medication Sig  . alendronate (FOSAMAX) 70 MG tablet Take 1 tablet (70 mg total) by mouth every 7 (seven) days. Take with a full glass of water on an empty stomach.Sit upright for 30 minutes after taking  . aspirin EC 81 MG EC tablet Take 1 tablet (81 mg total) by mouth daily.  Marland Kitchen atorvastatin (LIPITOR) 20 MG tablet Take 1 tablet (20 mg total) by mouth daily.  . Biotin 5000 MCG TABS Take 5,000 mcg by mouth daily.   . Calcium Carb-Cholecalciferol (CALTRATE 600+D3 PO) Take by mouth.  . Coenzyme Q10 (CO Q 10 PO) Take 300 mg by mouth daily.  Marland Kitchen levothyroxine (SYNTHROID, LEVOTHROID) 25 MCG tablet TAKE 1 TABLET EVERY DAY BEFORE BREAKFAST  . Melatonin 5 MG CAPS Take 10 mg by mouth at bedtime.  . Multiple Vitamin (MULTIVITAMIN) tablet Take 1 tablet by mouth daily. Centrum Silver  . Polyethylene Glycol 400 (BLINK TEARS) 0.25 % SOLN Apply to eye.  . Probiotic Product (PROBIOTIC DAILY PO) Take 1 capsule by mouth daily. Florify  . vitamin B-12 (CYANOCOBALAMIN) 1000 MCG tablet Take 1,000 mcg by mouth daily.  Marland Kitchen zinc gluconate 50 MG tablet Take 50 mg by mouth daily.     Allergies:   Niaspan [niacin er]; Phenazopyridine hcl; and Statins   Social History   Tobacco Use  . Smoking status: Never Smoker  . Smokeless tobacco: Never Used  Substance Use Topics  . Alcohol use: No  . Drug use: No     Family Hx: The patient's family history includes Cancer (age of onset: 51) in her brother; Cancer (age of onset: 40) in her maternal grandmother; Heart disease in her father; Stroke in her paternal grandfather; Stroke  (age of onset: 19) in her maternal grandfather.  ROS:   Please see the history of present illness.    All other systems reviewed and are negative.   Prior CV studies:   The following studies were reviewed today:  Echo 05/12/18: Study Conclusions  - Left ventricle: The cavity size was normal. Wall thickness was   normal. Systolic function was normal. The estimated ejection   fraction was in the range of 50% to 55%. Wall motion was normal;   there were no regional wall motion abnormalities. Doppler   parameters are consistent with abnormal left ventricular   relaxation (grade 1 diastolic dysfunction). - Aortic valve: There was trivial regurgitation. - Mitral valve: Prior procedures included surgical repair.  Impressions:  - Low normal LV systolic function; mild diastolic dysfunction;   trace AI; s/p MV repair with no MR.   Labs/Other Tests and Data Reviewed:    EKG:  No ECG reviewed.  Recent Labs: 03/11/2018: Magnesium 2.6 06/22/2018: ALT 11; BUN 14; Creat 1.05; Hemoglobin 14.3; Platelets 272; Potassium 4.5; Sodium 141; TSH 2.82   Recent Lipid Panel Lab Results  Component Value Date/Time   CHOL  252 (H) 06/22/2018 08:47 AM   TRIG 203 (H) 06/22/2018 08:47 AM   HDL 50 (L) 06/22/2018 08:47 AM   CHOLHDL 5.0 (H) 06/22/2018 08:47 AM   LDLCALC 166 (H) 06/22/2018 08:47 AM    Wt Readings from Last 3 Encounters:  09/06/18 151 lb 2 oz (68.5 kg)  06/20/18 152 lb (68.9 kg)  06/06/18 147 lb (66.7 kg)     Objective:    Vital Signs:  Ht 5\' 2"  (1.575 m)   Wt 151 lb 2 oz (68.5 kg)   BMI 27.64 kg/m     ASSESSMENT & PLAN:    1. Mitral valve regurgitation secondary to partially flail P2 segment of the MV with MV prolapse. MR was severe by TEE with jet directed anteriorly.  Now s/p MV repair on 03/10/18. She has made an excellent recovery. Follow up Echo looked good.    2. Hyperlipidemia: Now on statin therapy.  3. Hypothyroidism: TSH is normal.   COVID-19 Education:   The signs and symptoms of COVID-19 were discussed with the patient and how to seek care for testing (follow up with PCP or arrange E-visit).  The importance of social distancing was discussed today.  Time:   Today, I have spent 10 minutes with the patient with telehealth technology discussing the above problems.     Medication Adjustments/Labs and Tests Ordered: Current medicines are reviewed at length with the patient today.  Concerns regarding medicines are outlined above.  Tests Ordered: No orders of the defined types were placed in this encounter.  Medication Changes: No orders of the defined types were placed in this encounter.   Disposition:  Follow up in 6 month(s)  Signed, Peter Martinique, MD  09/06/2018 1:24 PM    [facility]
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
## 2018-09-06 ENCOUNTER — Ambulatory Visit: Payer: Medicare HMO | Admitting: Cardiology

## 2018-09-06 ENCOUNTER — Telehealth (INDEPENDENT_AMBULATORY_CARE_PROVIDER_SITE_OTHER): Payer: Medicare HMO | Admitting: Cardiology

## 2018-09-06 ENCOUNTER — Encounter: Payer: Self-pay | Admitting: Cardiology

## 2018-09-06 VITALS — Ht 62.0 in | Wt 151.1 lb

## 2018-09-06 DIAGNOSIS — Z79899 Other long term (current) drug therapy: Secondary | ICD-10-CM

## 2018-09-06 DIAGNOSIS — E039 Hypothyroidism, unspecified: Secondary | ICD-10-CM | POA: Diagnosis not present

## 2018-09-06 DIAGNOSIS — Z7189 Other specified counseling: Secondary | ICD-10-CM | POA: Diagnosis not present

## 2018-09-06 DIAGNOSIS — I341 Nonrheumatic mitral (valve) prolapse: Secondary | ICD-10-CM | POA: Diagnosis not present

## 2018-09-06 DIAGNOSIS — E785 Hyperlipidemia, unspecified: Secondary | ICD-10-CM | POA: Diagnosis not present

## 2018-09-06 DIAGNOSIS — Z9889 Other specified postprocedural states: Secondary | ICD-10-CM

## 2018-09-06 DIAGNOSIS — I340 Nonrheumatic mitral (valve) insufficiency: Secondary | ICD-10-CM

## 2018-09-06 NOTE — Patient Instructions (Signed)
Medication Instructions: Continue same medications If you need a refill on your cardiac medications before your next appointment, please call your pharmacy.   Lab work: None ordered   Testing/Procedures: None ordered  Follow-Up: At [facility], you and your health needs are our priority.  As part of our continuing mission to provide you with exceptional heart care, we have created designated Provider Care Teams.  These Care Teams include your primary Cardiologist (physician) and Advanced Practice Providers (APPs -  Physician Assistants and Nurse Practitioners) who all work together to provide you with the care you need, when you need it. . Schedule follow up visit with Dr.Jordan in 6 months     Call 3 months before to schedule

## 2018-09-09 ENCOUNTER — Other Ambulatory Visit: Payer: Self-pay | Admitting: Family Medicine

## 2018-09-09 MED ORDER — ALENDRONATE SODIUM 70 MG PO TABS: 70.0000 mg | ORAL_TABLET | ORAL | 3 refills | Status: DC

## 2018-09-20 ENCOUNTER — Ambulatory Visit: Payer: Medicare HMO

## 2018-11-02 ENCOUNTER — Other Ambulatory Visit: Payer: Self-pay

## 2018-11-02 ENCOUNTER — Ambulatory Visit
Admission: RE | Admit: 2018-11-02 | Discharge: 2018-11-02 | Disposition: A | Discharge status: Home / Self Care | Payer: Medicare HMO | Source: Ambulatory Visit | Attending: Family Medicine | Admitting: Family Medicine

## 2018-11-02 DIAGNOSIS — Z1231 Encounter for screening mammogram for malignant neoplasm of breast: Secondary | ICD-10-CM | POA: Diagnosis not present

## 2018-11-13 ENCOUNTER — Other Ambulatory Visit: Payer: Self-pay | Admitting: Family Medicine

## 2018-11-30 ENCOUNTER — Other Ambulatory Visit: Payer: Self-pay

## 2018-11-30 ENCOUNTER — Other Ambulatory Visit: Payer: Medicare HMO

## 2018-11-30 DIAGNOSIS — E039 Hypothyroidism, unspecified: Secondary | ICD-10-CM | POA: Diagnosis not present

## 2018-11-30 DIAGNOSIS — Z0000 Encounter for general adult medical examination without abnormal findings: Secondary | ICD-10-CM | POA: Diagnosis not present

## 2018-12-01 LAB — LIPID PANEL
Cholesterol: 152 mg/dL (ref <200)
HDL: 60 mg/dL (ref >=50)
LDL Cholesterol (Calc): 70 mg/dL (calc)
Non-HDL Cholesterol (Calc): 92 mg/dL (calc) (ref <130)
Total CHOL/HDL Ratio: 2.5 (calc) (ref <5.0)
Triglycerides: 136 mg/dL (ref <150)

## 2018-12-01 LAB — COMPREHENSIVE METABOLIC PANEL
AG Ratio: 1.9 (calc) (ref 1.0–2.5)
ALT: 20 U/L (ref 6–29)
AST: 17 U/L (ref 10–35)
Albumin: 4.3 g/dL (ref 3.6–5.1)
Alkaline phosphatase (APISO): 62 U/L (ref 37–153)
BUN/Creatinine Ratio: 15 (calc) (ref 6–22)
BUN: 18 mg/dL (ref 7–25)
CO2: 26 mmol/L (ref 20–32)
Calcium: 9.7 mg/dL (ref 8.6–10.4)
Chloride: 105 mmol/L (ref 98–110)
Creat: 1.2 mg/dL — ABNORMAL HIGH (ref 0.60–0.93)
Globulin: 2.3 g/dL (calc) (ref 1.9–3.7)
Glucose, Bld: 98 mg/dL (ref 65–99)
Potassium: 4.9 mmol/L (ref 3.5–5.3)
Sodium: 140 mmol/L (ref 135–146)
Total Bilirubin: 0.5 mg/dL (ref 0.2–1.2)
Total Protein: 6.6 g/dL (ref 6.1–8.1)

## 2018-12-01 LAB — CBC WITH DIFFERENTIAL/PLATELET
Absolute Monocytes: 390 cells/uL (ref 200–950)
Basophils Absolute: 31 cells/uL (ref 0–200)
Basophils Relative: 0.5 %
Eosinophils Absolute: 49 cells/uL (ref 15–500)
Eosinophils Relative: 0.8 %
HCT: 44 % (ref 35.0–45.0)
Hemoglobin: 14.6 g/dL (ref 11.7–15.5)
Lymphs Abs: 1800 cells/uL (ref 850–3900)
MCH: 31.3 pg (ref 27.0–33.0)
MCHC: 33.2 g/dL (ref 32.0–36.0)
MCV: 94.2 fL (ref 80.0–100.0)
MPV: 11 fL (ref 7.5–12.5)
Monocytes Relative: 6.4 %
Neutro Abs: 3831 cells/uL (ref 1500–7800)
Neutrophils Relative %: 62.8 %
Platelets: 242 10*3/uL (ref 140–400)
RBC: 4.67 10*6/uL (ref 3.80–5.10)
RDW: 12.7 % (ref 11.0–15.0)
Total Lymphocyte: 29.5 %
WBC: 6.1 10*3/uL (ref 3.8–10.8)

## 2018-12-01 LAB — TSH: TSH: 2.92 mIU/L (ref 0.40–4.50)

## 2018-12-06 ENCOUNTER — Encounter: Payer: Self-pay | Admitting: Family Medicine

## 2018-12-06 ENCOUNTER — Encounter: Payer: Medicare HMO | Admitting: Family Medicine

## 2018-12-06 ENCOUNTER — Other Ambulatory Visit: Payer: Self-pay | Admitting: *Deleted

## 2018-12-06 ENCOUNTER — Other Ambulatory Visit: Payer: Self-pay

## 2018-12-06 ENCOUNTER — Ambulatory Visit (INDEPENDENT_AMBULATORY_CARE_PROVIDER_SITE_OTHER): Payer: Medicare HMO | Admitting: Family Medicine

## 2018-12-06 VITALS — BP 138/98 | HR 98 | Temp 98.6°F | Resp 14 | Ht 61.5 in | Wt 150.0 lb

## 2018-12-06 DIAGNOSIS — Z0001 Encounter for general adult medical examination with abnormal findings: Secondary | ICD-10-CM | POA: Diagnosis not present

## 2018-12-06 DIAGNOSIS — E039 Hypothyroidism, unspecified: Secondary | ICD-10-CM | POA: Diagnosis not present

## 2018-12-06 DIAGNOSIS — M810 Age-related osteoporosis without current pathological fracture: Secondary | ICD-10-CM

## 2018-12-06 DIAGNOSIS — E041 Nontoxic single thyroid nodule: Secondary | ICD-10-CM

## 2018-12-06 DIAGNOSIS — Z23 Encounter for immunization: Secondary | ICD-10-CM

## 2018-12-06 DIAGNOSIS — Z9889 Other specified postprocedural states: Secondary | ICD-10-CM | POA: Diagnosis not present

## 2018-12-06 DIAGNOSIS — Z0000 Encounter for general adult medical examination without abnormal findings: Secondary | ICD-10-CM

## 2018-12-06 MED ORDER — ATORVASTATIN CALCIUM 20 MG PO TABS: 20.0000 mg | ORAL_TABLET | Freq: Every day | ORAL | 1 refills | Status: DC

## 2018-12-06 NOTE — Progress Notes (Signed)
Subjective:    Patient ID: Maria Gomez, female    DOB: [birth year], 73 y.o.   MRN: [id number]  HPI  03/2018 Patient had heart surgery approximately 1 month ago to repair mitral valve.  She is done extremely well postoperatively and has been started on Coumadin.  Unfortunately, she is developed a chronic nonproductive cough that she describes as an itch or a tingle in the back of her throat.  It tends to be worse during the day when she is up walking around while she is trying to talk to people or breathing cool air.  He denies any hemoptysis.  She denies any chest pain.  She denies any shortness of breath.  She denies any fevers or chills.  She denies any recent travel or exposures.  She tried Zyrtec on her own as well as Tessalon without any relief.  She denies any heartburn.  At that time, my plan was: Patient's exam is unremarkable.  She is therapeutic on Coumadin and therefore I believe it pulmonary embolism after surgery is unlikely.  Differential diagnosis includes upper airway cough syndrome, chronic bronchitis, laryngo-esophageal reflux, nerve irritation in the throat secondary to intubation causing a chronic cough, infection although unlikely.  Recommended a chest x-ray.  Start the patient on a combination of Flonase 2 sprays each nostril daily for possible postnasal drip coupled with Protonix 40 mg daily for possible silent reflux.  Obtain chest x-ray to rule out infection.  Start tramadol for nerve irritation 50 mg p.o. every 8 hours.  Recheck in 1 week.  If cough persist consider additional work-up or empiric trial of inhaled steroids for possible cough variant asthma  06/20/18 Patient has a history of carotid bruits.  She had carotid ultrasounds performed in October of last year.  The results are dictated below:  Right Carotid: Velocities in the right ICA are consistent with a 1-39% stenosis.  Left Carotid: Velocities in the left ICA are consistent with a 1-39%  stenosis. Vertebrals: Left vertebral artery demonstrates antegrade flow. Right vertebral             artery demonstrates high resistant flow.  She is on aspirin 81 mg a day but not a statin.  Patient has discontinued Coumadin and is now only on aspirin.  On a CT scan obtained by her cardiothoracic surgeon last fall, there was a coincidental finding of a 2.9 cm heterogeneous left thyroid nodule.  They recommended that she follow-up with me to discuss this.  This nodule is not palpable today on exam.  However it certainly meets size criteria for a biopsy.  She also reports a one-week history of dysuria, urgency, frequency.  Urinalysis is significant for blood as well as leukocyte esterase.  She denies any low back pain or fevers or chills or nausea or vomiting.  However at the end of the encounter, her visit took an unusual turn.  She states that her son has not spoken to her since Christmas.  She states that her son says she needs counseling.  When I questioned the patient as to why, she states that it is because she told her son the truth.  When I inquired as to what she was referencing, she states that she told her son how her daughter-in-law's brother is sabotaging her life.  This person works for the [employer] according to the patient.  According to the patient he has hacked into the all the electrical devices in her home.  He has messed up her TV  so that it will not work.  He has messed up her dehumidifier in her basement.  He is tapped her phones.  He is sabotaging her friend's cell phones and making them not work.  He is calling her at odd times and having her called by random telemarketer's.  Because of this individual she is receiving lots of spam mail.  When I questioned the patient as to why he would go to see she links to terrorized her and make her life miserable, she states is because she gave her family herpes.  She lists numerous electronic devices such as the TV the telephone in the dehumidifier the  computers the iPad is the cell phones that he has "hacked into and sabotaged".  When she told her son the truth about what was going on, her daughter-in-law became defensive and they have cut off all communication with her until she gets help.  She denies any suicidal ideation or homicidal ideation.  At that time, my plan was: Patient is due for lab work.  I have recommended that she return fasting for a CBC, CMP, fasting lipid panel, and a TSH.  Her goal LDL cholesterol is less than 70 given her carotid stenosis.  She is taking aspirin.  I want to ensure that her levothyroxine dose is appropriate.  I will treat her urinary tract infection with Keflex 500 mg p.o. 3 times daily for 1 week.  I will schedule the patient for a thyroid ultrasound to confirm the presence of the thyroid nodule.  If present and greater than 1 cm it will need a fine-needle aspiration to rule out thyroid malignancy.  However I spent more than 35 minutes with the patient today discussing her paranoid delusions about her extended family "sabotaging all the electrical devices in her home".  The story seems farfetched.  The patient states that she is not crazy.  She has insight into how while the story sounds however she is convinced that he is doing all these things in an effort to terrorized her and punish her.  This is definitely a fixed false belief and qualifies as delusional.  Therefore I have recommended psychiatry consultation.  I will arrange this for the patient.  12/06/18 Thyroid biopsy revealed atypical cells.  Confirmatory Afirma testing revealed a likely benign nodule with less than 4% chance for malignancy.  Patient is here today for a complete physical exam.  We discussed the biopsy results.  We discussed a referral to surgery for excisional biopsy versus clinical monitoring with repeat ultrasound in 1 year.  Patient elects to repeat the ultrasound in 1 year.  She is already scheduled her mammogram.  Due to her age she does not  require a Pap smear.  Her last colonoscopy recommended a repeat colonoscopy in 5 years.  This was performed in 2015.  Despite the colonoscopy being clear she has a family history of a brother who died from colon cancer at 63.  Therefore she is due this year.  She declines allowing me to schedule this today.  I question the patient about the situation in her family.  She states that her relationship with her son and her daughter-in-law have improved.  She still reports of paranoia regarding her daughter-in-law's brother.  She still states that he is tapping into her phones.  She denies any depression or anxiety or hallucinations although this appears to be a fixed delusion.  She declines referral to psychiatry at the present time.  Her blood pressure today  is elevated 138/98 Past Medical History:  Diagnosis Date   Hypothyroidism    Mitral regurgitation    Osteopenia    S/P minimally invasive mitral valve repair 03/10/2018   Complex valvuloplasty including triangular resection of posterior leaflet, artificial Gore-tex neochord x6 and Size 30 Sorin Memo 4D Ring SN [id number] via right mini thoracotomy approach   Thyroid nodule 03/07/2018   Incidental - noted on CT scan, atypical cells found on fine-needle aspiration.  General surgery consultation and excision recommended February 2020   Past Surgical History:  Procedure Laterality Date   BREAST EXCISIONAL BIOPSY Bilateral    MITRAL VALVE REPAIR Right 03/10/2018   Procedure: MINIMALLY INVASIVE MITRAL VALVE REPAIR (MVR):  -Triangular Resection of Posterior Leaflet P2; -Shortening Plasty of Posterior Leaflet P2; -Placement of Goretex Neo Chords x 6 using Chord-X system; -Ring Annuloplasty with a 30 mm Sorin Memo 4D Ring;  Surgeon: Rexene Alberts, MD;  Location: [place];  Service: Open Heart Surgery;  Laterality: Right;   RIGHT/LEFT HEART CATH AND CORONARY ANGIOGRAPHY N/A 01/18/2018   Procedure: RIGHT/LEFT HEART CATH AND CORONARY ANGIOGRAPHY;  Surgeon:  Martinique, Peter M, MD;  Location: [place] [facility];  Service: Cardiovascular;  Laterality: N/A;   TEE WITHOUT CARDIOVERSION N/A 11/09/2017   Procedure: TRANSESOPHAGEAL ECHOCARDIOGRAM (TEE);  Surgeon: Larey Dresser, MD;  Location[facility] ENDOSCOPY;  Service: Cardiovascular;  Laterality: N/A;   TEE WITHOUT CARDIOVERSION N/A 03/10/2018   Procedure: TRANSESOPHAGEAL ECHOCARDIOGRAM (TEE);  Surgeon: Rexene Alberts, MD;  Location: [place];  Service: Open Heart Surgery;  Laterality: N/A;   Current Outpatient Medications on File Prior to Visit  Medication Sig Dispense Refill   alendronate (FOSAMAX) 70 MG tablet Take 1 tablet (70 mg total) by mouth every 7 (seven) days. Take with a full glass of water on an empty stomach.Sit upright for 30 minutes after taking 12 tablet 3   aspirin EC 81 MG EC tablet Take 1 tablet (81 mg total) by mouth daily.     atorvastatin (LIPITOR) 20 MG tablet Take 1 tablet (20 mg total) by mouth daily. 90 tablet 1   Biotin 5000 MCG TABS Take 5,000 mcg by mouth daily.      Calcium Carb-Cholecalciferol (CALTRATE 600+D3 PO) Take by mouth.     cetirizine (ZYRTEC) 10 MG tablet Take 1 tablet (10 mg total) by mouth daily. 90 tablet 0   Coenzyme Q10 (CO Q 10 PO) Take 300 mg by mouth daily.     docusate sodium (COLACE) 100 MG capsule Take 100 mg by mouth daily.     fluticasone (FLONASE) 50 MCG/ACT nasal spray Place 2 sprays into both nostrils daily as needed for allergies or rhinitis. 16 g 6   levothyroxine (SYNTHROID) 25 MCG tablet TAKE 1 TABLET BY MOUTH EVERY DAY BEFORE BREAKFAST 90 tablet 1   Melatonin 5 MG CAPS Take 10 mg by mouth at bedtime.     Multiple Vitamin (MULTIVITAMIN) tablet Take 1 tablet by mouth daily. Centrum Silver     Polyethylene Glycol 400 (BLINK TEARS) 0.25 % SOLN Apply to eye.     Probiotic Product (PROBIOTIC DAILY PO) Take 1 capsule by mouth daily. Florify     vitamin B-12 (CYANOCOBALAMIN) 1000 MCG tablet Take 1,000 mcg by mouth daily.     zinc  gluconate 50 MG tablet Take 50 mg by mouth daily.     No current facility-administered medications on file prior to visit.    Allergies  Allergen Reactions   Niaspan [Niacin Er] Other (See  Comments)    Severe flushing   Phenazopyridine Hcl Other (See Comments)    Made sick    Statins     Severe flushing   Social History   Socioeconomic History   Marital status: Widowed    Spouse name: Not on file   Number of children: Not on file   Years of education: Not on file   Highest education level: Not on file  Occupational History   Not on file  Social Needs   Financial resource strain: Not on file   Food insecurity    Worry: Not on file    Inability: Not on file   Transportation needs    Medical: Not on file    Non-medical: Not on file  Tobacco Use   Smoking status: Never Smoker   Smokeless tobacco: Never Used  Substance and Sexual Activity   Alcohol use: No   Drug use: No   Sexual activity: Not on file  Lifestyle   Physical activity    Days per week: Not on file    Minutes per session: Not on file   Stress: Not on file  Relationships   Social connections    Talks on phone: Not on file    Gets together: Not on file    Attends religious service: Not on file    Active member of club or organization: Not on file    Attends meetings of clubs or organizations: Not on file    Relationship status: Not on file   Intimate partner violence    Fear of current or ex partner: Not on file    Emotionally abused: Not on file    Physically abused: Not on file    Forced sexual activity: Not on file  Other Topics Concern   Not on file  Social History Narrative   Lives with her mom.    Takes care of aunt who lives across the street.    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            Review of Systems  All other systems reviewed and are negative.      Objective:   Physical Exam Vitals signs reviewed.  Constitutional:       General: She is not in acute distress.    Appearance: Normal appearance. She is normal weight. She is not ill-appearing, toxic-appearing or diaphoretic.  HENT:     Head: Normocephalic and atraumatic.     Right Ear: Tympanic membrane, ear canal and external ear normal. There is no impacted cerumen.     Left Ear: Tympanic membrane, ear canal and external ear normal. There is no impacted cerumen.     Nose: Nose normal. No congestion or rhinorrhea.     Mouth/Throat:     Pharynx: No oropharyngeal exudate or posterior oropharyngeal erythema.  Eyes:     General: No scleral icterus.       Right eye: No discharge.        Left eye: No discharge.     Extraocular Movements: Extraocular movements intact.     Conjunctiva/sclera: Conjunctivae normal.     Pupils: Pupils are equal, round, and reactive to light.  Neck:     Musculoskeletal: Neck supple. No muscular tenderness.     Thyroid: No thyromegaly.     Vascular: No carotid bruit or JVD.     Trachea: No tracheal deviation.  Cardiovascular:     Rate and Rhythm: Normal rate  and regular rhythm.     Heart sounds: Murmur present. No friction rub. No gallop.   Pulmonary:     Effort: Pulmonary effort is normal. No respiratory distress.     Breath sounds: Normal breath sounds. No stridor. No wheezing, rhonchi or rales.  Chest:     Chest wall: No tenderness.  Abdominal:     General: Bowel sounds are normal. There is no distension.     Palpations: Abdomen is soft. There is no mass.     Tenderness: There is no abdominal tenderness. There is no right CVA tenderness, left CVA tenderness, guarding or rebound.     Hernia: No hernia is present.  Musculoskeletal:        General: No swelling, tenderness or deformity.     Right lower leg: No edema.     Left lower leg: No edema.  Lymphadenopathy:     Cervical: No cervical adenopathy.  Skin:    General: Skin is warm.     Coloration: Skin is not jaundiced or pale.     Findings: No bruising, erythema,  lesion or rash.  Neurological:     General: No focal deficit present.     Mental Status: She is alert and oriented to person, place, and time. Mental status is at baseline.     Cranial Nerves: No cranial nerve deficit.     Sensory: No sensory deficit.     Motor: No weakness.     Coordination: Coordination normal.     Gait: Gait normal.     Deep Tendon Reflexes: Reflexes normal.  Psychiatric:        Mood and Affect: Mood normal.        Behavior: Behavior normal.        Thought Content: Thought content normal.        Judgment: Judgment normal.    Lab on 11/30/2018  Component Date Value Ref Range Status   Glucose, Bld 11/30/2018 98  65 - 99 mg/dL Final   Comment: .            Fasting reference interval .    BUN 11/30/2018 18  7 - 25 mg/dL Final   Creat 11/30/2018 1.20* 0.60 - 0.93 mg/dL Final   Comment: For patients >58 years of age, the reference limit for Creatinine is approximately 13% higher for people identified as African-American. .    BUN/Creatinine Ratio 11/30/2018 15  6 - 22 (calc) Final   Sodium 11/30/2018 140  135 - 146 mmol/L Final   Potassium 11/30/2018 4.9  3.5 - 5.3 mmol/L Final   Chloride 11/30/2018 105  98 - 110 mmol/L Final   CO2 11/30/2018 26  20 - 32 mmol/L Final   Calcium 11/30/2018 9.7  8.6 - 10.4 mg/dL Final   Total Protein 11/30/2018 6.6  6.1 - 8.1 g/dL Final   Albumin 11/30/2018 4.3  3.6 - 5.1 g/dL Final   Globulin 11/30/2018 2.3  1.9 - 3.7 g/dL (calc) Final   AG Ratio 11/30/2018 1.9  1.0 - 2.5 (calc) Final   Total Bilirubin 11/30/2018 0.5  0.2 - 1.2 mg/dL Final   Alkaline phosphatase (APISO) 11/30/2018 62  37 - 153 U/L Final   AST 11/30/2018 17  10 - 35 U/L Final   ALT 11/30/2018 20  6 - 29 U/L Final   Cholesterol 11/30/2018 152  <200 mg/dL Final   HDL 11/30/2018 60  > OR = 50 mg/dL Final   Triglycerides 11/30/2018 136  <150 mg/dL Final  LDL Cholesterol (Calc) 11/30/2018 70  mg/dL (calc) Final   Comment: Reference range:  <100 . Desirable range <100 mg/dL for primary prevention;   <70 mg/dL for patients with CHD or diabetic patients  with > or = 2 CHD risk factors. Marland Kitchen LDL-C is now calculated using the Martin-Hopkins  calculation, which is a validated novel method providing  better accuracy than the Friedewald equation in the  estimation of LDL-C.  Cresenciano Genre et al. Annamaria Helling. 9924;268(34): 2061-2068  (http://education.[facility].com/faq/FAQ164)    Total CHOL/HDL Ratio 11/30/2018 2.5  <5.0 (calc) Final   Non-HDL Cholesterol (Calc) 11/30/2018 92  <130 mg/dL (calc) Final   Comment: For patients with diabetes plus 1 major ASCVD risk  factor, treating to a non-HDL-C goal of <100 mg/dL  (LDL-C of <70 mg/dL) is considered a therapeutic  option.    WBC 11/30/2018 6.1  3.8 - 10.8 Thousand/uL Final   RBC 11/30/2018 4.67  3.80 - 5.10 Million/uL Final   Hemoglobin 11/30/2018 14.6  11.7 - 15.5 g/dL Final   HCT 11/30/2018 44.0  35.0 - 45.0 % Final   MCV 11/30/2018 94.2  80.0 - 100.0 fL Final   MCH 11/30/2018 31.3  27.0 - 33.0 pg Final   MCHC 11/30/2018 33.2  32.0 - 36.0 g/dL Final   RDW 11/30/2018 12.7  11.0 - 15.0 % Final   Platelets 11/30/2018 242  140 - 400 Thousand/uL Final   MPV 11/30/2018 11.0  7.5 - 12.5 fL Final   Neutro Abs 11/30/2018 3,831  1,500 - 7,800 cells/uL Final   Lymphs Abs 11/30/2018 1,800  850 - 3,900 cells/uL Final   Absolute Monocytes 11/30/2018 390  200 - 950 cells/uL Final   Eosinophils Absolute 11/30/2018 49  15 - 500 cells/uL Final   Basophils Absolute 11/30/2018 31  0 - 200 cells/uL Final   Neutrophils Relative % 11/30/2018 62.8  % Final   Total Lymphocyte 11/30/2018 29.5  % Final   Monocytes Relative 11/30/2018 6.4  % Final   Eosinophils Relative 11/30/2018 0.8  % Final   Basophils Relative 11/30/2018 0.5  % Final   TSH 11/30/2018 2.92  0.40 - 4.50 mIU/L Final           Assessment & Plan:  1. Thyroid nodule, cold - US THYROID; Future  2.  Hypothyroidism, unspecified type  3. Osteoporosis without current pathological fracture, unspecified osteoporosis type   4. S/P minimally invasive mitral valve repair   5. General medical exam Physical exam today is normal aside from the patient's delusions regarding her daughter-in-law's brother.  She declines referral to a counselor or therapist at the present time.  Blood pressure is elevated.  I reviewed her most recent lab work with her which does show a decline in her kidney function.  I would recommend repeating this in 6 months.  Otherwise her thyroid test is outstanding.  The biopsy of the nodule but came back reassuring.  We will repeat an ultrasound of this in 1 year to monitor it.  She is due for her next bone density next year.  She is already schedule her mammogram.  I recommended a colonoscopy but she declines this at the present time.  Patient is due for Pneumovax 23 which she received today.  Otherwise Prevnar 13 and her shingles vaccine are up-to-date.  She declines any problems with falls or memory loss.  She denies any depression at the present time.

## 2018-12-06 NOTE — Addendum Note (Signed)
Addended by: Shary Decamp B on: 12/06/2018 11:49 AM   Modules accepted: Orders

## 2018-12-07 ENCOUNTER — Ambulatory Visit
Admission: RE | Admit: 2018-12-07 | Discharge: 2018-12-07 | Disposition: A | Discharge status: Home / Self Care | Payer: Medicare HMO | Source: Ambulatory Visit | Attending: Family Medicine | Admitting: Family Medicine

## 2018-12-07 DIAGNOSIS — E042 Nontoxic multinodular goiter: Secondary | ICD-10-CM | POA: Diagnosis not present

## 2018-12-07 DIAGNOSIS — E041 Nontoxic single thyroid nodule: Secondary | ICD-10-CM

## 2019-01-05 DIAGNOSIS — R69 Illness, unspecified: Secondary | ICD-10-CM | POA: Diagnosis not present

## 2019-02-24 ENCOUNTER — Ambulatory Visit (INDEPENDENT_AMBULATORY_CARE_PROVIDER_SITE_OTHER): Payer: Medicare HMO | Admitting: Family Medicine

## 2019-02-24 ENCOUNTER — Encounter: Payer: Self-pay | Admitting: Family Medicine

## 2019-02-24 ENCOUNTER — Other Ambulatory Visit: Payer: Self-pay

## 2019-02-24 VITALS — BP 128/74 | HR 86 | Temp 97.6°F | Resp 14 | Ht 61.5 in | Wt 155.0 lb

## 2019-02-24 DIAGNOSIS — K5909 Other constipation: Secondary | ICD-10-CM | POA: Diagnosis not present

## 2019-02-24 NOTE — Progress Notes (Signed)
Subjective:    Patient ID: Maria Gomez, female    DOB: [birth year], 73 y.o.   MRN: [id number]  HPI  Patient has been suffering from constipation.  She states that she can go several days without having a bowel movement.  When she does have a bowel movement they will be hard balls of stool.  She never feels like she is completely evacuated her colon.  She also reports pressure and discomfort when she goes several days without having a bowel movement.  She tried MiraLAX however this caused watery stool.  She tried Senokot but this did not seem to work for her. Past Medical History:  Diagnosis Date  . Hypothyroidism   . Mitral regurgitation   . Osteopenia   . S/P minimally invasive mitral valve repair 03/10/2018   Complex valvuloplasty including triangular resection of posterior leaflet, artificial Gore-tex neochord x6 and Size 30 Sorin Memo 4D Ring SN [id number] via right mini thoracotomy approach  . Thyroid nodule 03/07/2018   Incidental - noted on CT scan, atypical cells found on fine-needle aspiration.  General surgery consultation and excision recommended February 2020   Past Surgical History:  Procedure Laterality Date  . BREAST EXCISIONAL BIOPSY Bilateral   . MITRAL VALVE REPAIR Right 03/10/2018   Procedure: MINIMALLY INVASIVE MITRAL VALVE REPAIR (MVR):  -Triangular Resection of Posterior Leaflet P2; -Shortening Plasty of Posterior Leaflet P2; -Placement of Goretex Neo Chords x 6 using Chord-X system; -Ring Annuloplasty with a 30 mm Sorin Memo 4D Ring;  Surgeon: Rexene Alberts, MD;  Location: [facility];  Service: Open Heart Surgery;  Laterality: Right;  . RIGHT/LEFT HEART CATH AND CORONARY ANGIOGRAPHY N/A 01/18/2018   Procedure: RIGHT/LEFT HEART CATH AND CORONARY ANGIOGRAPHY;  Surgeon: Martinique, Peter M, MD;  Location: [facility];  Service: Cardiovascular;  Laterality: N/A;  . TEE WITHOUT CARDIOVERSION N/A 11/09/2017   Procedure: TRANSESOPHAGEAL ECHOCARDIOGRAM (TEE);  Surgeon:  Larey Dresser, MD;  Location: [facility];  Service: Cardiovascular;  Laterality: N/A;  . TEE WITHOUT CARDIOVERSION N/A 03/10/2018   Procedure: TRANSESOPHAGEAL ECHOCARDIOGRAM (TEE);  Surgeon: Rexene Alberts, MD;  Location: [facility];  Service: Open Heart Surgery;  Laterality: N/A;   Current Outpatient Medications on File Prior to Visit  Medication Sig Dispense Refill  . alendronate (FOSAMAX) 70 MG tablet Take 1 tablet (70 mg total) by mouth every 7 (seven) days. Take with a full glass of water on an empty stomach.Sit upright for 30 minutes after taking 12 tablet 3  . aspirin EC 81 MG EC tablet Take 1 tablet (81 mg total) by mouth daily.    Marland Kitchen atorvastatin (LIPITOR) 20 MG tablet Take 1 tablet (20 mg total) by mouth daily. 90 tablet 1  . Biotin 5000 MCG TABS Take 5,000 mcg by mouth daily.     . Calcium Carb-Cholecalciferol (CALTRATE 600+D3 PO) Take by mouth.    . Coenzyme Q10 (CO Q 10 PO) Take 800 mg by mouth daily.     Marland Kitchen docusate sodium (COLACE) 100 MG capsule Take 100 mg by mouth daily.    Marland Kitchen levothyroxine (SYNTHROID) 25 MCG tablet TAKE 1 TABLET BY MOUTH EVERY DAY BEFORE BREAKFAST 90 tablet 1  . Melatonin 5 MG CAPS Take 10 mg by mouth at bedtime.    . Multiple Vitamin (MULTIVITAMIN) tablet Take 1 tablet by mouth daily. Centrum Silver    . Polyethylene Glycol 400 (BLINK TEARS) 0.25 % SOLN Apply to eye.    . vitamin B-12 (CYANOCOBALAMIN) 1000 MCG tablet  Take 1,000 mcg by mouth daily.    Marland Kitchen zinc gluconate 50 MG tablet Take 50 mg by mouth daily.     No current facility-administered medications on file prior to visit.    Allergies  Allergen Reactions  . Niaspan [Niacin Er] Other (See Comments)    Severe flushing  . Phenazopyridine Hcl Other (See Comments)    Made sick   . Statins     Severe flushing   Social History   Socioeconomic History  . Marital status: Widowed    Spouse name: Not on file  . Number of children: Not on file  . Years of education: Not on file  . Highest education  level: Not on file  Occupational History  . Not on file  Social Needs  . Financial resource strain: Not on file  . Food insecurity    Worry: Not on file    Inability: Not on file  . Transportation needs    Medical: Not on file    Non-medical: Not on file  Tobacco Use  . Smoking status: Never Smoker  . Smokeless tobacco: Never Used  Substance and Sexual Activity  . Alcohol use: No  . Drug use: No  . Sexual activity: Not on file  Lifestyle  . Physical activity    Days per week: Not on file    Minutes per session: Not on file  . Stress: Not on file  Relationships  . Social Herbalist on phone: Not on file    Gets together: Not on file    Attends religious service: Not on file    Active member of club or organization: Not on file    Attends meetings of clubs or organizations: Not on file    Relationship status: Not on file  . Intimate partner violence    Fear of current or ex partner: Not on file    Emotionally abused: Not on file    Physically abused: Not on file    Forced sexual activity: Not on file  Other Topics Concern  . Not on file  Social History Narrative   Lives with her mom.    Takes care of aunt who lives across the street.    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            Review of Systems  All other systems reviewed and are negative.      Objective:   Physical Exam Vitals signs reviewed.  HENT:     Mouth/Throat:     Pharynx: No oropharyngeal exudate.  Neck:     Musculoskeletal: Neck supple.     Thyroid: No thyromegaly.     Vascular: No JVD.     Trachea: No tracheal deviation.  Cardiovascular:     Rate and Rhythm: Normal rate and regular rhythm.     Heart sounds: Murmur present.  Pulmonary:     Effort: Pulmonary effort is normal. No respiratory distress.     Breath sounds: Normal breath sounds. No stridor. No wheezing or rales.  Abdominal:     General: Bowel sounds are normal.     Palpations:  Abdomen is soft.  Lymphadenopathy:     Cervical: No cervical adenopathy.           Assessment & Plan:  Chronic constipation, begin Amitiza 8 mg p.o. twice daily and supplement with Metamucil daily.  Reassess in 1 week or sooner if symptoms are not improving

## 2019-03-06 ENCOUNTER — Telehealth (INDEPENDENT_AMBULATORY_CARE_PROVIDER_SITE_OTHER): Payer: Medicare HMO | Admitting: Thoracic Surgery (Cardiothoracic Vascular Surgery)

## 2019-03-06 ENCOUNTER — Other Ambulatory Visit: Payer: Self-pay

## 2019-03-06 DIAGNOSIS — Z9889 Other specified postprocedural states: Secondary | ICD-10-CM

## 2019-03-06 NOTE — Patient Instructions (Signed)
Continue all previous medications without any changes at this time  

## 2019-03-06 NOTE — Progress Notes (Signed)
[address]       [place] [address]             [phone]     CARDIOTHORACIC SURGERY TELEPHONE VIRTUAL OFFICE NOTE  Primary Cardiologist is Peter Martinique, MD PCP is Dennard Schaumann Cammie Mcgee, MD   HPI:  I spoke with Maria Gomez (DOB 1945/08/04 ) via telephone on 03/06/2019 at 11:00 AM and verified that I was speaking with the correct person using more than one form of identification.  We discussed the reason(s) for conducting our visit virtually instead of in-person.  The patient expressed understanding the circumstances and agreed to proceed as described.   Patient is a 73 year old female who underwent minimally invasive mitral valve repair on March 10, 2018 for severe symptomatic primary mitral regurgitation.  Her postoperative recovery was uneventful and routine follow-up echocardiogram performed May 12, 2018 revealed low normal left ventricular systolic function with intact mitral valve repair and no residual mitral regurgitation.  She was last seen here in our office on June 06, 2018 at which time she was doing well.  I spoke with her over the telephone today to make sure that she continues to do well.  She states that she feels very well and notably "much better" than she did prior to surgery.  She is quite active physically, tending to her elderly mother and taking care of all of the chores around her house.  She mows 15 acres of grass and spends a great deal of time outdoors.  She reports no significant physical limitations other than the fact that she just gets tired.  She specifically denies any history of exertional shortness of breath or chest tightness.  She is very pleased with her progress.   Current Outpatient Medications  Medication Sig Dispense Refill  . alendronate (FOSAMAX) 70 MG tablet Take 1 tablet (70 mg total) by mouth every 7 (seven) days. Take with a full glass of water on an empty stomach.Sit upright for 30 minutes after taking  12 tablet 3  . aspirin EC 81 MG EC tablet Take 1 tablet (81 mg total) by mouth daily.    Marland Kitchen atorvastatin (LIPITOR) 20 MG tablet Take 1 tablet (20 mg total) by mouth daily. 90 tablet 1  . Biotin 5000 MCG TABS Take 5,000 mcg by mouth daily.     . Calcium Carb-Cholecalciferol (CALTRATE 600+D3 PO) Take by mouth.    . Coenzyme Q10 (CO Q 10 PO) Take 800 mg by mouth daily.     Marland Kitchen docusate sodium (COLACE) 100 MG capsule Take 100 mg by mouth daily.    Marland Kitchen levothyroxine (SYNTHROID) 25 MCG tablet TAKE 1 TABLET BY MOUTH EVERY DAY BEFORE BREAKFAST 90 tablet 1  . Melatonin 5 MG CAPS Take 10 mg by mouth at bedtime.    . Multiple Vitamin (MULTIVITAMIN) tablet Take 1 tablet by mouth daily. Centrum Silver    . Polyethylene Glycol 400 (BLINK TEARS) 0.25 % SOLN Apply to eye.    . vitamin B-12 (CYANOCOBALAMIN) 1000 MCG tablet Take 1,000 mcg by mouth daily.    Marland Kitchen zinc gluconate 50 MG tablet Take 50 mg by mouth daily.     No current facility-administered medications for this visit.      Diagnostic Tests:  n/a   Impression:  Patient is doing very well approximately 1 year status post minimally invasive mitral valve repair.  She denies any symptoms of exertional shortness of breath.  Follow-up echocardiogram performed December 2019 revealed  low normal left ventricular systolic function with intact mitral valve repair no residual mitral regurgitation.    Plan:  We have not recommended any changes to the patient's current medications or treatment plans.  The patient will continue to follow-up intermittently with Dr. Martinique and return to [place] in the future only should specific questions or problems arise.  The patient has been reminded regarding the importance of dental hygiene and the lifelong need for antibiotic prophylaxis for all dental cleanings and other related invasive procedures.    I discussed limitations of evaluation and management via telephone.  The patient was advised to call back for repeat telephone  consultation or to seek an in-person evaluation if questions arise or the patient's clinical condition changes in any significant manner.  I spent in excess of 10 minutes of non-face-to-face time during the conduct of this telephone virtual office consultation.     Valentina Gu. Roxy Manns, MD 03/06/2019 11:00 AM

## 2019-03-22 DIAGNOSIS — K5900 Constipation, unspecified: Secondary | ICD-10-CM | POA: Diagnosis not present

## 2019-03-22 DIAGNOSIS — Z800 Family history of malignant neoplasm of digestive organs: Secondary | ICD-10-CM | POA: Diagnosis not present

## 2019-03-22 DIAGNOSIS — R194 Change in bowel habit: Secondary | ICD-10-CM | POA: Diagnosis not present

## 2019-03-28 ENCOUNTER — Ambulatory Visit: Payer: Medicare HMO | Admitting: Cardiology

## 2019-04-24 ENCOUNTER — Ambulatory Visit (INDEPENDENT_AMBULATORY_CARE_PROVIDER_SITE_OTHER): Payer: Medicare HMO | Admitting: Family Medicine

## 2019-04-24 ENCOUNTER — Encounter: Payer: Self-pay | Admitting: Family Medicine

## 2019-04-24 ENCOUNTER — Other Ambulatory Visit: Payer: Self-pay

## 2019-04-24 ENCOUNTER — Ambulatory Visit
Admission: RE | Admit: 2019-04-24 | Discharge: 2019-04-24 | Disposition: A | Discharge status: Home / Self Care | Payer: Medicare HMO | Source: Ambulatory Visit | Attending: Family Medicine | Admitting: Family Medicine

## 2019-04-24 VITALS — BP 146/88 | HR 86 | Temp 97.1°F | Resp 16 | Ht 61.5 in | Wt 151.0 lb

## 2019-04-24 DIAGNOSIS — Z1211 Encounter for screening for malignant neoplasm of colon: Secondary | ICD-10-CM

## 2019-04-24 DIAGNOSIS — K5904 Chronic idiopathic constipation: Secondary | ICD-10-CM | POA: Diagnosis not present

## 2019-04-24 DIAGNOSIS — R69 Illness, unspecified: Secondary | ICD-10-CM | POA: Diagnosis not present

## 2019-04-24 DIAGNOSIS — F22 Delusional disorders: Secondary | ICD-10-CM

## 2019-04-24 DIAGNOSIS — E039 Hypothyroidism, unspecified: Secondary | ICD-10-CM | POA: Diagnosis not present

## 2019-04-24 MED ORDER — ATORVASTATIN CALCIUM 20 MG PO TABS: 20.0000 mg | ORAL_TABLET | Freq: Every day | ORAL | 1 refills | Status: DC

## 2019-04-24 MED ORDER — ALENDRONATE SODIUM 70 MG PO TABS: 70.0000 mg | ORAL_TABLET | ORAL | 3 refills | Status: DC

## 2019-04-24 MED ORDER — LEVOTHYROXINE SODIUM 25 MCG PO TABS: ORAL_TABLET | ORAL | 1 refills | Status: DC

## 2019-04-24 NOTE — Progress Notes (Signed)
Subjective:    Patient ID: Maria Gomez, female    DOB: [birth year], 73 y.o.   MRN: [id number]  HPI  03/2018 Patient had heart surgery approximately 1 month ago to repair mitral valve.  She is done extremely well postoperatively and has been started on Coumadin.  Unfortunately, she is developed a chronic nonproductive cough that she describes as an itch or a tingle in the back of her throat.  It tends to be worse during the day when she is up walking around while she is trying to talk to people or breathing cool air.  He denies any hemoptysis.  She denies any chest pain.  She denies any shortness of breath.  She denies any fevers or chills.  She denies any recent travel or exposures.  She tried Zyrtec on her own as well as Tessalon without any relief.  She denies any heartburn.  At that time, my plan was: Patient's exam is unremarkable.  She is therapeutic on Coumadin and therefore I believe it pulmonary embolism after surgery is unlikely.  Differential diagnosis includes upper airway cough syndrome, chronic bronchitis, laryngo-esophageal reflux, nerve irritation in the throat secondary to intubation causing a chronic cough, infection although unlikely.  Recommended a chest x-ray.  Start the patient on a combination of Flonase 2 sprays each nostril daily for possible postnasal drip coupled with Protonix 40 mg daily for possible silent reflux.  Obtain chest x-ray to rule out infection.  Start tramadol for nerve irritation 50 mg p.o. every 8 hours.  Recheck in 1 week.  If cough persist consider additional work-up or empiric trial of inhaled steroids for possible cough variant asthma  06/20/18 Patient has a history of carotid bruits.  She had carotid ultrasounds performed in October of last year.  The results are dictated below:  Right Carotid: Velocities in the right ICA are consistent with a 1-39% stenosis.  Left Carotid: Velocities in the left ICA are consistent with a 1-39% stenosis.  Vertebrals: Left vertebral artery demonstrates antegrade flow. Right vertebral             artery demonstrates high resistant flow.  She is on aspirin 81 mg a day but not a statin.  Patient has discontinued Coumadin and is now only on aspirin.  On a CT scan obtained by her cardiothoracic surgeon last fall, there was a coincidental finding of a 2.9 cm heterogeneous left thyroid nodule.  They recommended that she follow-up with me to discuss this.  This nodule is not palpable today on exam.  However it certainly meets size criteria for a biopsy.  She also reports a one-week history of dysuria, urgency, frequency.  Urinalysis is significant for blood as well as leukocyte esterase.  She denies any low back pain or fevers or chills or nausea or vomiting.  However at the end of the encounter, her visit took an unusual turn.  She states that her son has not spoken to her since Christmas.  She states that her son says she needs counseling.  When I questioned the patient as to why, she states that it is because she told her son the truth.  When I inquired as to what she was referencing, she states that she told her son how her daughter-in-law's brother is sabotaging her life.  This person works for the [employer] according to the patient.  According to the patient he has hacked into the all the electrical devices in her home.  He has messed up her TV  so that it will not work.  He has messed up her dehumidifier in her basement.  He is tapped her phones.  He is sabotaging her friend's cell phones and making them not work.  He is calling her at odd times and having her called by random telemarketer's.  Because of this individual she is receiving lots of spam mail.  When I questioned the patient as to why he would go to see she links to terrorized her and make her life miserable, she states is because she gave her family herpes.  She lists numerous electronic devices such as the TV the telephone in the dehumidifier the computers  the iPad is the cell phones that he has "hacked into and sabotaged".  When she told her son the truth about what was going on, her daughter-in-law became defensive and they have cut off all communication with her until she gets help.  She denies any suicidal ideation or homicidal ideation.  At that time, my plan was: Patient is due for lab work.  I have recommended that she return fasting for a CBC, CMP, fasting lipid panel, and a TSH.  Her goal LDL cholesterol is less than 70 given her carotid stenosis.  She is taking aspirin.  I want to ensure that her levothyroxine dose is appropriate.  I will treat her urinary tract infection with Keflex 500 mg p.o. 3 times daily for 1 week.  I will schedule the patient for a thyroid ultrasound to confirm the presence of the thyroid nodule.  If present and greater than 1 cm it will need a fine-needle aspiration to rule out thyroid malignancy.  However I spent more than 35 minutes with the patient today discussing her paranoid delusions about her extended family "sabotaging all the electrical devices in her home".  The story seems farfetched.  The patient states that she is not crazy.  She has insight into how while the story sounds however she is convinced that he is doing all these things in an effort to terrorized her and punish her.  This is definitely a fixed false belief and qualifies as delusional.  Therefore I have recommended psychiatry consultation.  I will arrange this for the patient.  12/06/18 Thyroid biopsy revealed atypical cells.  Confirmatory Afirma testing revealed a likely benign nodule with less than 4% chance for malignancy.  Patient is here today for a complete physical exam.  We discussed the biopsy results.  We discussed a referral to surgery for excisional biopsy versus clinical monitoring with repeat ultrasound in 1 year.  Patient elects to repeat the ultrasound in 1 year.  She is already scheduled her mammogram.  Due to her age she does not require a  Pap smear.  Her last colonoscopy recommended a repeat colonoscopy in 5 years.  This was performed in 2015.  Despite the colonoscopy being clear she has a family history of a brother who died from colon cancer at 37.  Therefore she is due this year.  She declines allowing me to schedule this today.  I question the patient about the situation in her family.  She states that her relationship with her son and her daughter-in-law have improved.  She still reports of paranoia regarding her daughter-in-law's brother.  She still states that he is tapping into her phones.  She denies any depression or anxiety or hallucinations although this appears to be a fixed delusion.  She declines referral to psychiatry at the present time.  Her blood pressure today  is elevated 138/98.  At that time, my plan was: Physical exam today is normal aside from the patient's delusions regarding her daughter-in-law's brother.  She declines referral to a counselor or therapist at the present time.  Blood pressure is elevated.  I reviewed her most recent lab work with her which does show a decline in her kidney function.  I would recommend repeating this in 6 months.  Otherwise her thyroid test is outstanding.  The biopsy of the nodule but came back reassuring.  We will repeat an ultrasound of this in 1 year to monitor it.  She is due for her next bone density next year.  She is already schedule her mammogram.  I recommended a colonoscopy but she declines this at the present time.  Patient is due for Pneumovax 23 which she received today.  Otherwise Prevnar 13 and her shingles vaccine are up-to-date.  She declines any problems with falls or memory loss.  She denies any depression at the present time.   04/24/19 Patient presents today with her son.  First she reports fecal leakage.  She states that stool is coming out uncontrollably almost like toothpaste.  I saw the patient 1 month ago and at that time she was complaining of not going to the  bathroom to defecate for several days in a row.  At that time she was experiencing hard balls of stool.  I recommended trying Amitiza however she stopped that medication due to diarrhea after 1 week.  She states that ever since stool has been leaking out again with a consistency of toothpaste.  She is unable to control it.  She is also overdue to check her thyroid/TSH.  She would like to do that today while she is here.  The patient continues to endorse delusions.  She believes that people are tapping her telephones.  She believes that extended members of her family are sabotaging her telephone and her iPad.  She believes that my office as well as the gastroenterologist office are refusing to see her due to her HPV infection which is not the case.  No amount of reassurance can convince her otherwise.  She is also dealing with anxiety and stress related to these concerns and fears and delusional thoughts.  I have recommended psychiatry but the patient refused that in the past.  However today with both the insistence of myself and her son, the patient is willing to discuss the situation with a psychiatrist.  I believe the patient may have schizoaffective disorder.  Therefore I would strongly recommend getting a second opinion with a psychiatrist as I explained to the patient.  I believe this is better managed by a psychiatrist and a primary care physician. Past Medical History:  Diagnosis Date  . Hypothyroidism   . Mitral regurgitation   . Osteopenia   . S/P minimally invasive mitral valve repair 03/10/2018   Complex valvuloplasty including triangular resection of posterior leaflet, artificial Gore-tex neochord x6 and Size 30 Sorin Memo 4D Ring SN [id number] via right mini thoracotomy approach  . Thyroid nodule 03/07/2018   Incidental - noted on CT scan, atypical cells found on fine-needle aspiration.  General surgery consultation and excision recommended February 2020   Past Surgical History:  Procedure  Laterality Date  . BREAST EXCISIONAL BIOPSY Bilateral   . MITRAL VALVE REPAIR Right 03/10/2018   Procedure: MINIMALLY INVASIVE MITRAL VALVE REPAIR (MVR):  -Triangular Resection of Posterior Leaflet P2; -Shortening Plasty of Posterior Leaflet P2; -Placement of Goretex Neo  Chords x 6 using Chord-X system; -Ring Annuloplasty with a 30 mm Sorin Memo 4D Ring;  Surgeon: Rexene Alberts, MD;  Location: [facility];  Service: Open Heart Surgery;  Laterality: Right;  . RIGHT/LEFT HEART CATH AND CORONARY ANGIOGRAPHY N/A 01/18/2018   Procedure: RIGHT/LEFT HEART CATH AND CORONARY ANGIOGRAPHY;  Surgeon: Martinique, Peter M, MD;  Location: [facility];  Service: Cardiovascular;  Laterality: N/A;  . TEE WITHOUT CARDIOVERSION N/A 11/09/2017   Procedure: TRANSESOPHAGEAL ECHOCARDIOGRAM (TEE);  Surgeon: Larey Dresser, MD;  Location: [facility] ENDOSCOPY;  Service: Cardiovascular;  Laterality: N/A;  . TEE WITHOUT CARDIOVERSION N/A 03/10/2018   Procedure: TRANSESOPHAGEAL ECHOCARDIOGRAM (TEE);  Surgeon: Rexene Alberts, MD;  Location: [facility];  Service: Open Heart Surgery;  Laterality: N/A;   Current Outpatient Medications on File Prior to Visit  Medication Sig Dispense Refill  . aspirin EC 81 MG EC tablet Take 1 tablet (81 mg total) by mouth daily.    . Biotin 5000 MCG TABS Take 5,000 mcg by mouth daily.     . Calcium Carb-Cholecalciferol (CALTRATE 600+D3 PO) Take by mouth.    . Coenzyme Q10 (CO Q 10 PO) Take 800 mg by mouth daily.     Marland Kitchen docusate sodium (COLACE) 100 MG capsule Take 100 mg by mouth daily.    . Melatonin 5 MG CAPS Take 10 mg by mouth at bedtime.    . Multiple Vitamin (MULTIVITAMIN) tablet Take 1 tablet by mouth daily. Centrum Silver    . Polyethylene Glycol 400 (BLINK TEARS) 0.25 % SOLN Apply to eye.    . vitamin B-12 (CYANOCOBALAMIN) 1000 MCG tablet Take 1,000 mcg by mouth daily.    Marland Kitchen zinc gluconate 50 MG tablet Take 50 mg by mouth daily.     No current facility-administered medications on file prior to  visit.    Allergies  Allergen Reactions  . Niaspan [Niacin Er] Other (See Comments)    Severe flushing  . Phenazopyridine Hcl Other (See Comments)    Made sick   . Statins     Severe flushing   Social History   Socioeconomic History  . Marital status: Widowed    Spouse name: Not on file  . Number of children: Not on file  . Years of education: Not on file  . Highest education level: Not on file  Occupational History  . Not on file  Social Needs  . Financial resource strain: Not on file  . Food insecurity    Worry: Not on file    Inability: Not on file  . Transportation needs    Medical: Not on file    Non-medical: Not on file  Tobacco Use  . Smoking status: Never Smoker  . Smokeless tobacco: Never Used  Substance and Sexual Activity  . Alcohol use: No  . Drug use: No  . Sexual activity: Not on file  Lifestyle  . Physical activity    Days per week: Not on file    Minutes per session: Not on file  . Stress: Not on file  Relationships  . Social Herbalist on phone: Not on file    Gets together: Not on file    Attends religious service: Not on file    Active member of club or organization: Not on file    Attends meetings of clubs or organizations: Not on file    Relationship status: Not on file  . Intimate partner violence    Fear of current or ex  partner: Not on file    Emotionally abused: Not on file    Physically abused: Not on file    Forced sexual activity: Not on file  Other Topics Concern  . Not on file  Social History Narrative   Lives with her mom.    Takes care of aunt who lives across the street.    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            Review of Systems  All other systems reviewed and are negative.      Objective:   Physical Exam Vitals signs reviewed.  Constitutional:      General: She is not in acute distress.    Appearance: Normal appearance. She is normal weight. She is not  ill-appearing, toxic-appearing or diaphoretic.  HENT:     Head: Normocephalic and atraumatic.     Right Ear: Tympanic membrane, ear canal and external ear normal. There is no impacted cerumen.     Left Ear: Tympanic membrane, ear canal and external ear normal. There is no impacted cerumen.     Nose: Nose normal. No congestion or rhinorrhea.     Mouth/Throat:     Pharynx: No oropharyngeal exudate or posterior oropharyngeal erythema.  Eyes:     General: No scleral icterus.       Right eye: No discharge.        Left eye: No discharge.     Extraocular Movements: Extraocular movements intact.     Conjunctiva/sclera: Conjunctivae normal.     Pupils: Pupils are equal, round, and reactive to light.  Neck:     Musculoskeletal: Neck supple. No muscular tenderness.     Thyroid: No thyromegaly.     Vascular: No carotid bruit or JVD.     Trachea: No tracheal deviation.  Cardiovascular:     Rate and Rhythm: Normal rate and regular rhythm.     Heart sounds: Murmur present. No friction rub. No gallop.   Pulmonary:     Effort: Pulmonary effort is normal. No respiratory distress.     Breath sounds: Normal breath sounds. No stridor. No wheezing, rhonchi or rales.  Chest:     Chest wall: No tenderness.  Abdominal:     General: Bowel sounds are normal. There is no distension.     Palpations: Abdomen is soft. There is no mass.     Tenderness: There is no abdominal tenderness. There is no right CVA tenderness, left CVA tenderness, guarding or rebound.     Hernia: No hernia is present.  Musculoskeletal:        General: No swelling, tenderness or deformity.     Right lower leg: No edema.     Left lower leg: No edema.  Lymphadenopathy:     Cervical: No cervical adenopathy.  Skin:    General: Skin is warm.     Coloration: Skin is not jaundiced or pale.     Findings: No bruising, erythema, lesion or rash.  Neurological:     General: No focal deficit present.     Mental Status: She is alert and  oriented to person, place, and time. Mental status is at baseline.     Cranial Nerves: No cranial nerve deficit.     Sensory: No sensory deficit.     Motor: No weakness.     Coordination: Coordination normal.     Gait: Gait normal.     Deep Tendon Reflexes: Reflexes normal.  Psychiatric:  Mood and Affect: Mood normal.        Behavior: Behavior normal.        Thought Content: Thought content normal.        Judgment: Judgment normal.             Assessment & Plan:  Hypothyroidism, unspecified type - Plan: COMPLETE METABOLIC PANEL WITH GFR, TSH  Chronic idiopathic constipation - Plan: Ambulatory referral to Gastroenterology, DG Abd 2 Views  Delusions [facility]) - Plan: Ambulatory referral to Psychiatry  Colon cancer screening  We will recheck TSH today to ensure adequate dosage of levothyroxine.  Strongly recommended the covid vaccine when available.  Recommended psychiatry consult for delusional thoughts and possible schizoaffective disorder.  I am concerned that the patient may have overflow incontinence due to constipation. Obtain xray to rule out constipation as a cause of possible "encoparesis".  If not, consider bulking agents or even cholestyramine.  Spent 35 minutes with patient today. Consult GI for colon can screening as well.

## 2019-04-25 LAB — COMPLETE METABOLIC PANEL WITH GFR
AG Ratio: 1.6 (calc) (ref 1.0–2.5)
ALT: 50 U/L — ABNORMAL HIGH (ref 6–29)
AST: 33 U/L (ref 10–35)
Albumin: 4.1 g/dL (ref 3.6–5.1)
Alkaline phosphatase (APISO): 98 U/L (ref 37–153)
BUN/Creatinine Ratio: 17 (calc) (ref 6–22)
BUN: 18 mg/dL (ref 7–25)
CO2: 25 mmol/L (ref 20–32)
Calcium: 9.8 mg/dL (ref 8.6–10.4)
Chloride: 106 mmol/L (ref 98–110)
Creat: 1.04 mg/dL — ABNORMAL HIGH (ref 0.60–0.93)
GFR, Est African American: 62 mL/min/{1.73_m2} (ref >=60)
GFR, Est Non African American: 53 mL/min/{1.73_m2} — ABNORMAL LOW (ref >=60)
Globulin: 2.6 g/dL (calc) (ref 1.9–3.7)
Glucose, Bld: 112 mg/dL — ABNORMAL HIGH (ref 65–99)
Potassium: 4.4 mmol/L (ref 3.5–5.3)
Sodium: 141 mmol/L (ref 135–146)
Total Bilirubin: 0.3 mg/dL (ref 0.2–1.2)
Total Protein: 6.7 g/dL (ref 6.1–8.1)

## 2019-04-25 LAB — TSH: TSH: 2.95 mIU/L (ref 0.40–4.50)

## 2019-04-27 ENCOUNTER — Other Ambulatory Visit: Payer: Self-pay | Admitting: Family Medicine

## 2019-04-27 MED ORDER — LINACLOTIDE 72 MCG PO CAPS: 72.0000 ug | ORAL_CAPSULE | Freq: Every day | ORAL | 1 refills | Status: DC

## 2019-05-04 ENCOUNTER — Encounter: Payer: Self-pay | Admitting: Family Medicine

## 2019-05-14 IMAGING — US US FNA BIOPSY THYROID 1ST LESION
1 series · 13 of 20 positions shown · non-contrast
Comparison: US THYROID 06/23/2018

MEDICATIONS:
None

COMPLICATIONS:
None immediate.

INDICATION: Indeterminate thyroid nodule of the left inferior thyroid. Request
is made for fine-needle aspiration of indeterminate thyroid nodule.

EXAM:
ULTRASOUND GUIDED FINE NEEDLE ASPIRATION OF INDETERMINATE THYROID
NODULE
TECHNIQUE: Informed written consent was obtained from the patient after a
discussion of the risks, benefits and alternatives to treatment.
Questions regarding the procedure were encouraged and answered. A
timeout was performed prior to the initiation of the procedure.

[Series 1: us fna biopsy thyroid 1st lesion · 0.06mm/px · 20 acquisitions, 13 frames shown]
[im 1/20]
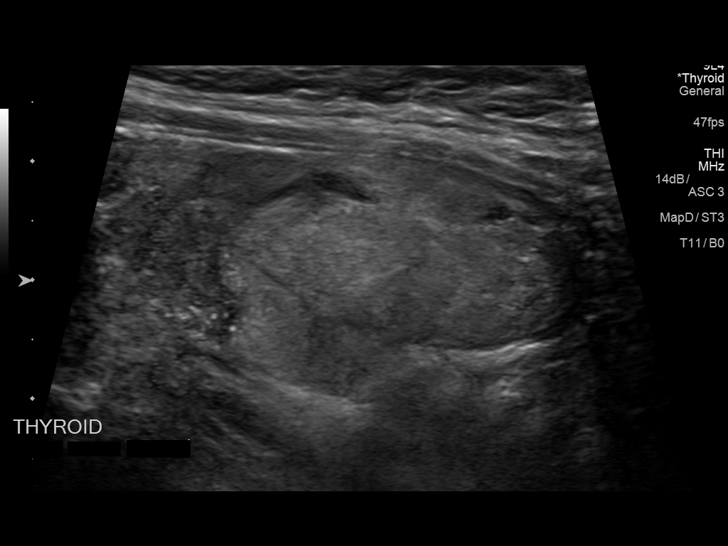
[im 3/20]
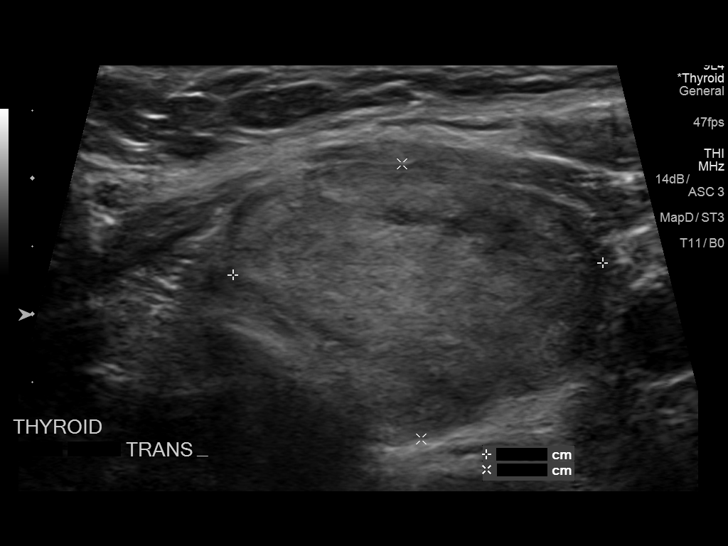
[im 4/20]
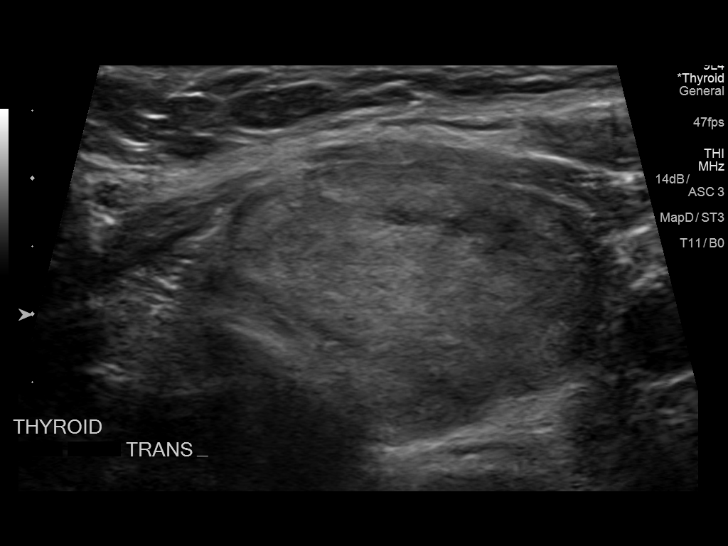
[im 6/20]
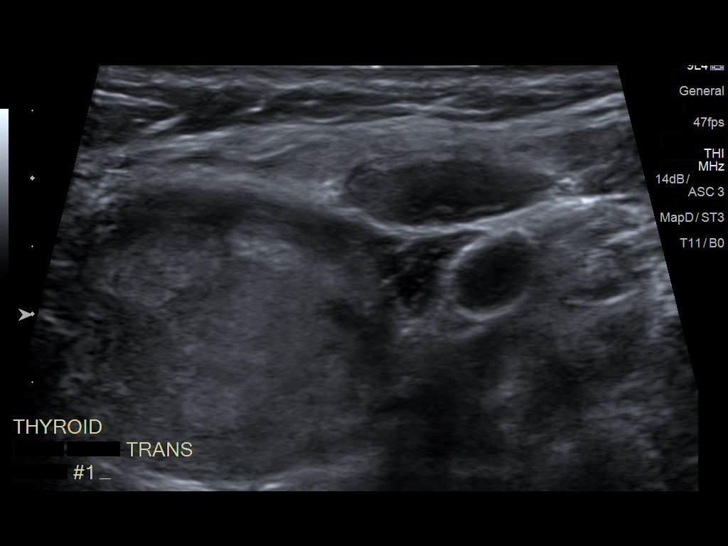
[im 7/20]
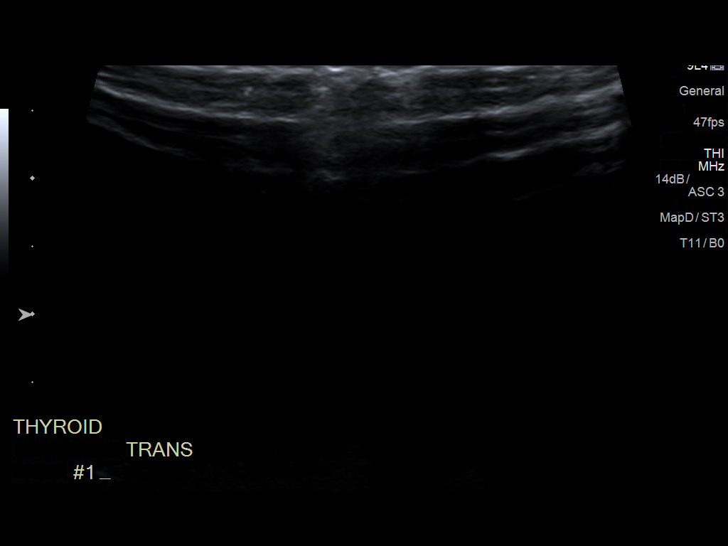
[im 9/20]
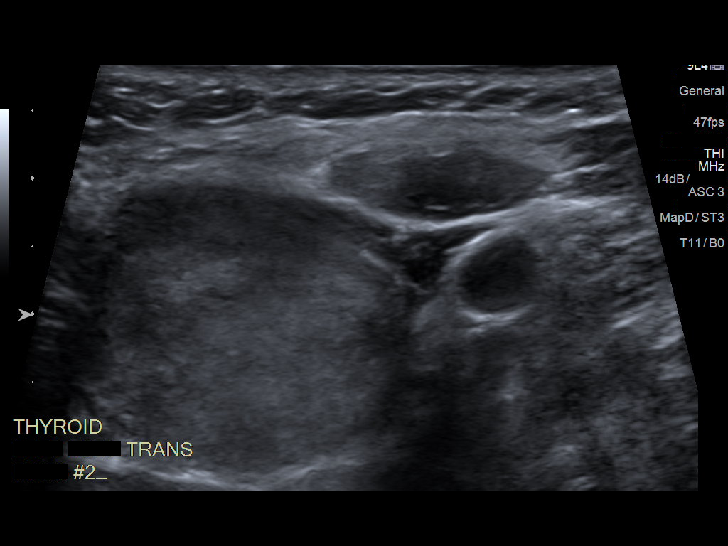
[im 11/20]
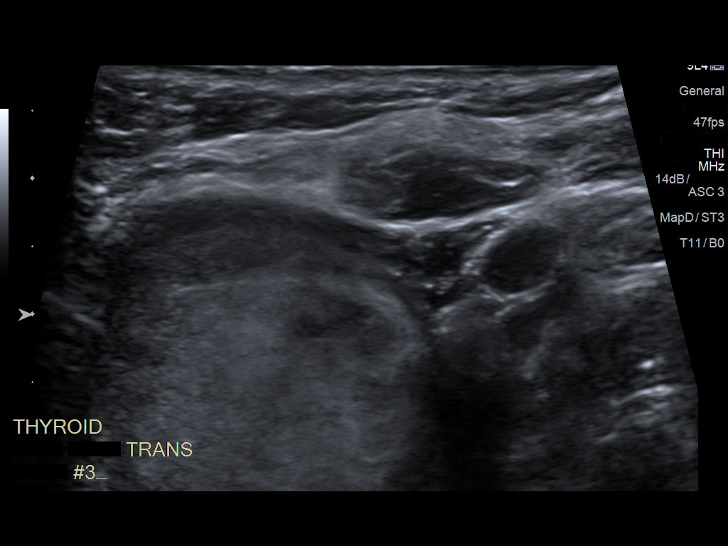
[im 12/20]
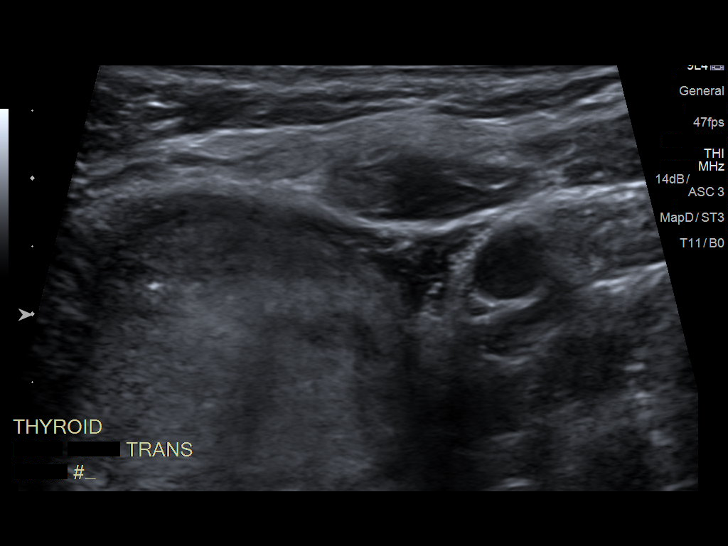
[im 14/20]
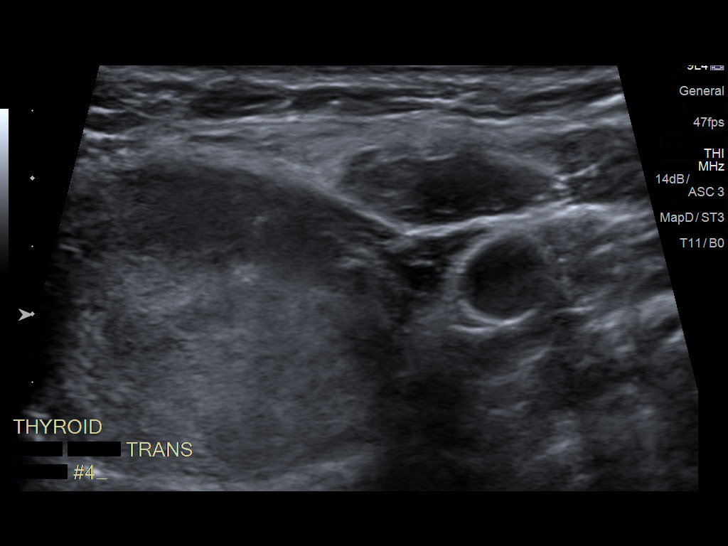
[im 15/20]
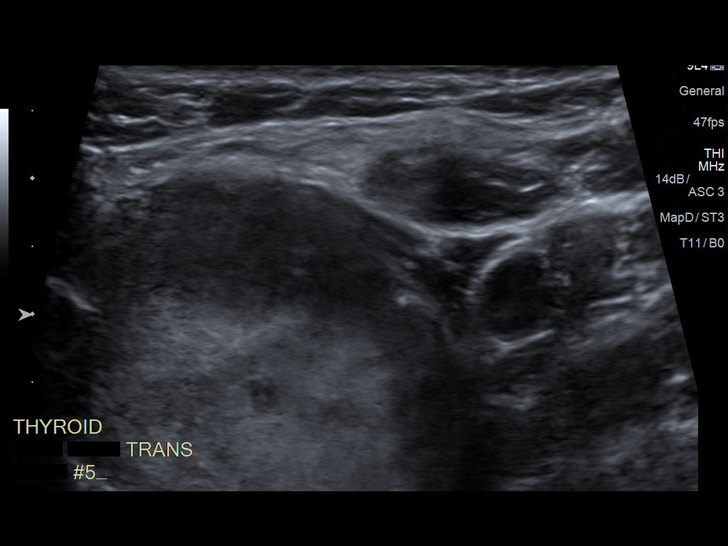
[im 17/20]
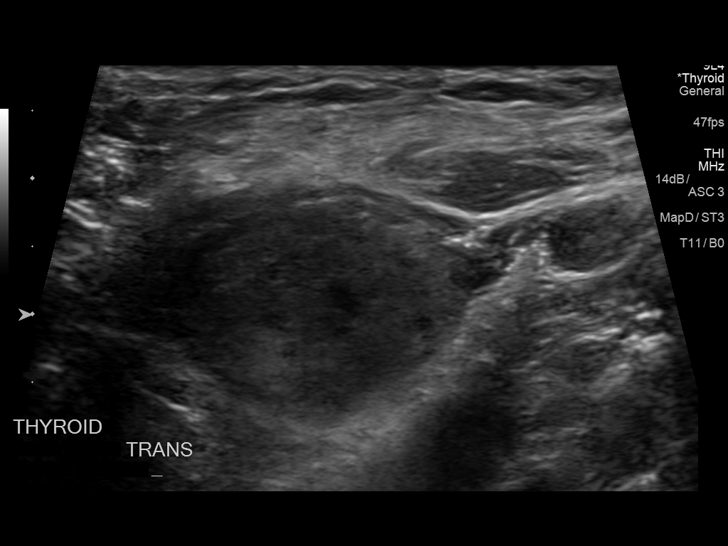
[im 18/20]
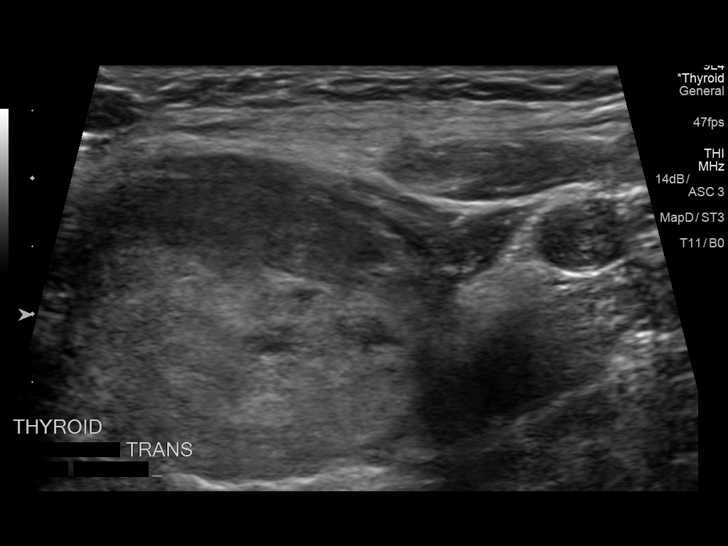
[im 20/20]
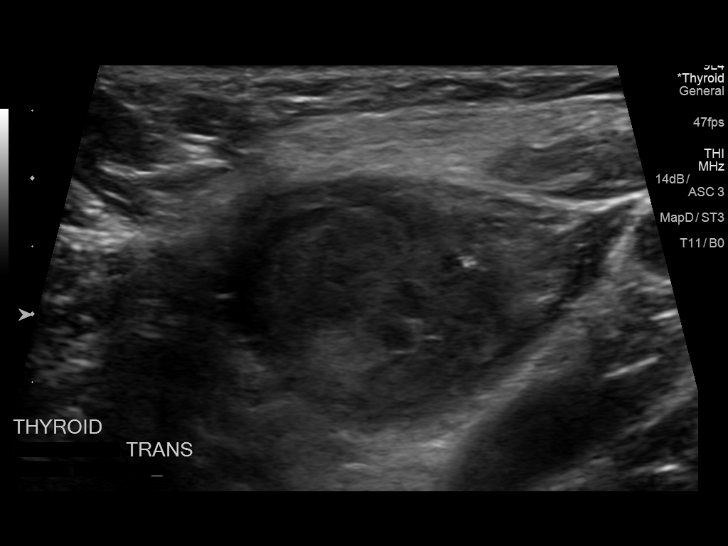

[13 of 20 positions shown; findings below may reference images not displayed]

Pre-procedural ultrasound scanning demonstrated unchanged size and
appearance of the indeterminate nodule within the left inferior
thyroid.

The procedure was planned. The neck was prepped in the usual sterile
fashion, and a sterile drape was applied covering the operative
field. A timeout was performed prior to the initiation of the
procedure. Local anesthesia was provided with 1% lidocaine.

Under direct ultrasound guidance, 5 FNA biopsies were performed of
the left inferior thyroid nodule with a 25 gauge needle. Multiple
ultrasound images were saved for procedural documentation purposes.
The samples were prepared and submitted to pathology. Sample was
also prepared for Afirma testing.

Limited post procedural scanning showed small, stable hematoma but
no other additional complication. Dressings were placed. The patient
tolerated the above procedures procedure well without immediate
postprocedural complication.
FINDINGS: Nodule reference number based on prior diagnostic ultrasound: 2

Maximum size: 2.9 cm

Location: Left; Inferior

ACR TI-RADS risk category: TR2 (2 points)

Reason for biopsy: meets ACR TI-RADS criteria

Ultrasound imaging confirms appropriate placement of the needles
within the thyroid nodule.
IMPRESSION: Technically successful ultrasound guided fine needle aspiration of
left inferior thyroid nodule.

## 2019-05-29 DIAGNOSIS — Z1159 Encounter for screening for other viral diseases: Secondary | ICD-10-CM | POA: Diagnosis not present

## 2019-05-30 DIAGNOSIS — D123 Benign neoplasm of transverse colon: Secondary | ICD-10-CM | POA: Diagnosis not present

## 2019-05-30 DIAGNOSIS — K5730 Diverticulosis of large intestine without perforation or abscess without bleeding: Secondary | ICD-10-CM | POA: Diagnosis not present

## 2019-05-30 DIAGNOSIS — Z800 Family history of malignant neoplasm of digestive organs: Secondary | ICD-10-CM | POA: Diagnosis not present

## 2019-06-02 DIAGNOSIS — D123 Benign neoplasm of transverse colon: Secondary | ICD-10-CM | POA: Diagnosis not present

## 2019-06-13 ENCOUNTER — Telehealth: Payer: Self-pay | Admitting: Family Medicine

## 2019-06-13 MED ORDER — LUBIPROSTONE 24 MCG PO CAPS: 24.0000 ug | ORAL_CAPSULE | Freq: Two times a day (BID) | ORAL | 5 refills | Status: DC

## 2019-06-13 NOTE — Telephone Encounter (Signed)
24 mcg pobid

## 2019-06-13 NOTE — Telephone Encounter (Signed)
Pt called and states that she would like a rx for Amitza 82mcg that you had given her sample of it in her lov. She thinks the [place] worked far better then the [employer]. She did have her colonoscopy and he recommended she contact us for a prescription.  OK to fill?

## 2019-06-13 NOTE — Telephone Encounter (Signed)
Medication called/sent to requested pharmacy  

## 2019-06-16 ENCOUNTER — Telehealth: Payer: Self-pay | Admitting: Family Medicine

## 2019-06-16 NOTE — Telephone Encounter (Signed)
Pt called and states that the Amitiza was $450 and the pharmacy informed her that the [place] would be a lot cheaper and would like to know if you would switch that for her?

## 2019-06-19 NOTE — Telephone Encounter (Signed)
Movantik only treats narcotic induced constipation.  She is not on narcotics.  If linzess does not work, I would try dulcolax with miralax on a daily basis.  One is a stimulant (dulcolax) and one is a softener (miralax).

## 2019-06-19 NOTE — Telephone Encounter (Signed)
Patient aware of providers recommendations.  

## 2019-06-26 DIAGNOSIS — R69 Illness, unspecified: Secondary | ICD-10-CM | POA: Diagnosis not present

## 2019-07-02 NOTE — Progress Notes (Signed)
Cardiology Office Note    Date:  07/04/2019   ID:  [name], Sferrazza 01-20-1946, MRN [id number]  PCP:  Susy Frizzle, MD  Cardiologist:  Dr. Martinique  Chief Complaint  Patient presents with  . Mitral Valve Prolapse    History of Present Illness:  Maria Gomez is a 74 y.o. female seen for followup Mitral insufficiency. She was intially evaluated by Almyra Deforest PA-C on 11/03/17 for evaluation of a heart murmur. She has a  PMH of hypothyroidism and HLD but no prior cardiac history.  On the previous office visit with his primary care provider in March 2018, a heart murmur was heard.  Echocardiogram obtained on 09/01/2016 showed EF 60 to 65%, mild LVH with moderate focal basal septal hypertrophy, grade 1 DD, thickened mitral leaflet with posterior leaflet prolapse, mild MR, moderate LAE, PA peak pressure was 21 mmHg.  Carotid Doppler obtained on 09/04/2016 showed no significant disease bilaterally.  Echocardiogram was repeated on 09/20/2017, this showed EF 55 to 60%, bileaflet prolapse of mitral valve with at least moderate to severe MR directed anteriorly into the left atrium, PA peak pressure 35 mmHg. She subsequently underwent TEE showing bileaflet MVP with a flail P2 segment and severe MR. She subsequently underwent [facility] on 01/18/18 confirming severe MR. Normal coronary arteries. Normal right heart pressures.   She had minimally invasive mitral valve repair on 03/10/2018 by Dr. Roxy Manns. She did very well with this and repeat Echo showed resolution of her MR. On follow up she denies any chest pain, dyspnea, edema, palpitations. She feels very well.   Past Medical History:  Diagnosis Date  . Hypothyroidism   . Mitral regurgitation   . Osteopenia   . S/P minimally invasive mitral valve repair 03/10/2018   Complex valvuloplasty including triangular resection of posterior leaflet, artificial Gore-tex neochord x6 and Size 30 Sorin Memo 4D Ring SN [id number] via right mini thoracotomy  approach  . Thyroid nodule 03/07/2018   Incidental - noted on CT scan, atypical cells found on fine-needle aspiration.  General surgery consultation and excision recommended February 2020    Past Surgical History:  Procedure Laterality Date  . BREAST EXCISIONAL BIOPSY Bilateral   . MITRAL VALVE REPAIR Right 03/10/2018   Procedure: MINIMALLY INVASIVE MITRAL VALVE REPAIR (MVR):  -Triangular Resection of Posterior Leaflet P2; -Shortening Plasty of Posterior Leaflet P2; -Placement of Goretex Neo Chords x 6 using Chord-X system; -Ring Annuloplasty with a 30 mm Sorin Memo 4D Ring;  Surgeon: Rexene Alberts, MD;  Location: [place];  Service: Open Heart Surgery;  Laterality: Right;  . RIGHT/LEFT HEART CATH AND CORONARY ANGIOGRAPHY N/A 01/18/2018   Procedure: RIGHT/LEFT HEART CATH AND CORONARY ANGIOGRAPHY;  Surgeon: Martinique, Dafina Suk M, MD;  Location: [place] [facility];  Service: Cardiovascular;  Laterality: N/A;  . TEE WITHOUT CARDIOVERSION N/A 11/09/2017   Procedure: TRANSESOPHAGEAL ECHOCARDIOGRAM (TEE);  Surgeon: Larey Dresser, MD;  Location: [facility] ENDOSCOPY;  Service: Cardiovascular;  Laterality: N/A;  . TEE WITHOUT CARDIOVERSION N/A 03/10/2018   Procedure: TRANSESOPHAGEAL ECHOCARDIOGRAM (TEE);  Surgeon: Rexene Alberts, MD;  Location: [place];  Service: Open Heart Surgery;  Laterality: N/A;    Current Medications: Outpatient Medications Prior to Visit  Medication Sig Dispense Refill  . alendronate (FOSAMAX) 70 MG tablet Take 1 tablet (70 mg total) by mouth every 7 (seven) days. Take with a full glass of water on an empty stomach.Sit upright for 30 minutes after taking 12 tablet 3  . aspirin EC 81  MG EC tablet Take 1 tablet (81 mg total) by mouth daily.    Marland Kitchen atorvastatin (LIPITOR) 20 MG tablet Take 1 tablet (20 mg total) by mouth daily. 90 tablet 1  . Biotin 5000 MCG TABS Take 5,000 mcg by mouth daily.     . Calcium Carb-Cholecalciferol (CALTRATE 600+D3 PO) Take by mouth.    . Coenzyme Q10 (CO Q 10 PO)  Take 800 mg by mouth daily.     Marland Kitchen levothyroxine (SYNTHROID) 25 MCG tablet TAKE 1 TABLET BY MOUTH EVERY DAY BEFORE BREAKFAST 90 tablet 1  . Melatonin 5 MG CAPS Take 10 mg by mouth at bedtime.    . Multiple Vitamin (MULTIVITAMIN) tablet Take 1 tablet by mouth daily. Centrum Silver    . Polyethylene Glycol 400 (BLINK TEARS) 0.25 % SOLN Apply to eye.    . vitamin B-12 (CYANOCOBALAMIN) 1000 MCG tablet Take 1,000 mcg by mouth daily.    Marland Kitchen zinc gluconate 50 MG tablet Take 50 mg by mouth daily.    Marland Kitchen docusate sodium (COLACE) 100 MG capsule Take 100 mg by mouth daily.    Marland Kitchen linaclotide (LINZESS) 72 MCG capsule Take 1 capsule (72 mcg total) by mouth daily before breakfast. 30 capsule 1  . lubiprostone (AMITIZA) 24 MCG capsule Take 1 capsule (24 mcg total) by mouth 2 (two) times daily with a meal. 60 capsule 5   No facility-administered medications prior to visit.     Allergies:   Niaspan [niacin er], Phenazopyridine hcl, and Statins   Social History   Socioeconomic History  . Marital status: Widowed    Spouse name: Not on file  . Number of children: Not on file  . Years of education: Not on file  . Highest education level: Not on file  Occupational History  . Not on file  Tobacco Use  . Smoking status: Never Smoker  . Smokeless tobacco: Never Used  Substance and Sexual Activity  . Alcohol use: No  . Drug use: No  . Sexual activity: Not on file  Other Topics Concern  . Not on file  Social History Narrative   Lives with her mom.    Takes care of aunt who lives across the street.    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         Social Determinants of Health   Financial Resource Strain:   . Difficulty of Paying Living Expenses: Not on file  Food Insecurity:   . Worried About Charity fundraiser in the Last Year: Not on file  . Ran Out of Food in the Last Year: Not on file  Transportation Needs:   . Lack of Transportation (Medical): Not on file  . Lack of  Transportation (Non-Medical): Not on file  Physical Activity:   . Days of Exercise per Week: Not on file  . Minutes of Exercise per Session: Not on file  Stress:   . Feeling of Stress : Not on file  Social Connections:   . Frequency of Communication with Friends and Family: Not on file  . Frequency of Social Gatherings with Friends and Family: Not on file  . Attends Religious Services: Not on file  . Active Member of Clubs or Organizations: Not on file  . Attends Archivist Meetings: Not on file  . Marital Status: Not on file     Family History:  The patient's family history includes Cancer (age of onset: 39) in her brother;  Cancer (age of onset: 3) in her maternal grandmother; Heart disease in her father; Stroke in her paternal grandfather; Stroke (age of onset: 47) in her maternal grandfather.   ROS:   Please see the history of present illness.    ROS All other systems reviewed and are negative.   PHYSICAL EXAM:   VS:  BP 110/78 (BP Location: Right Arm, Patient Position: Sitting, Cuff Size: Normal)   Pulse 85   Temp (!) 97.3 F (36.3 C)   Ht 5\' 1"  (1.549 m)   Wt 153 lb (69.4 kg)   BMI 28.91 kg/m    GENERAL:  Well appearing overweight WF in NAD HEENT:  PERRL, EOMI, sclera are clear. Oropharynx is clear. NECK:  No jugular venous distention, carotid upstroke brisk and symmetric, no bruits, no thyromegaly or adenopathy LUNGS:  Clear to auscultation bilaterally CHEST:  Unremarkable HEART:  RRR,  PMI not displaced or sustained,S1 and S2 within normal limits, no S3, no S4: no clicks, no rubs, no murmur ABD:  Soft, nontender. BS +, no masses or bruits. No hepatomegaly, no splenomegaly EXT:  2 + pulses throughout, no edema, no cyanosis no clubbing. Radial cath site has healed well. SKIN:  Warm and dry.  No rashes NEURO:  Alert and oriented x 3. Cranial nerves II through XII intact. PSYCH:  Cognitively intact    Wt Readings from Last 3 Encounters:  07/04/19 153 lb  (69.4 kg)  04/24/19 151 lb (68.5 kg)  02/24/19 155 lb (70.3 kg)      Studies/Labs Reviewed:   EKG:  EKG is  ordered today.  NSR with one PVC. Otherwise normal. Rate 85. I have personally reviewed and interpreted this study.   Recent Labs: 11/30/2018: Hemoglobin 14.6; Platelets 242 04/24/2019: ALT 50; BUN 18; Creat 1.04; Potassium 4.4; Sodium 141; TSH 2.95   Lipid Panel    Component Value Date/Time   CHOL 152 11/30/2018 0951   TRIG 136 11/30/2018 0951   HDL 60 11/30/2018 0951   CHOLHDL 2.5 11/30/2018 0951   VLDL 31 (H) 08/11/2016 0857   LDLCALC 70 11/30/2018 0951    Additional studies/ records that were reviewed today include:   Echo 09/01/2016 LV EF: 60% -   65% Study Conclusions  - Left ventricle: The cavity size was normal. Wall thickness was   increased in a pattern of mild LVH. There was moderate focal   basal hypertrophy of the septum. Systolic function was normal.   The estimated ejection fraction was in the range of 60% to 65%.   Wall motion was normal; there were no regional wall motion   abnormalities. Doppler parameters are consistent with abnormal   left ventricular relaxation (grade 1 diastolic dysfunction). The   E/e&' ratio is >15, suggesting elevated LV filling pressure. - Aortic valve: Trileaflet. Sclerosis without stenosis. There was   no regurgitation. - Mitral valve: Thickened leaflets with posterior leaflet prolapse.   Trace to mild regurgitation. - Left atrium: The atrium was moderately dilated. - Tricuspid valve: There was trivial regurgitation. - Pulmonary arteries: PA peak pressure: 21 mm Hg (S). - Inferior vena cava: The vessel was normal in size. The   respirophasic diameter changes were in the normal range (>= 50%),   consistent with normal central venous pressure.  Impressions:  - LVEF 60-65%, mild LVH with moderate focal basal septal   hypertrophy, normal wall motion, grade 1 DD with elevated LV   filling pressure, aortic valve  sclerosis, posterior mitral valve   leaflet prolapse  with mild to moderate anteriorly directed mitral   regurgitation, moderate LAE, trivial TR, RVSP 21 mmHg, normal   IVC.   Echo 09/20/2017 LV EF: 55% -   60% Study Conclusions  - Left ventricle: The cavity size was normal. Wall thickness was   increased in a pattern of mild LVH. Systolic function was normal.   The estimated ejection fraction was in the range of 55% to 60%. - Mitral valve: There is bileaflet prolapse of the MV MR is   directed anteirorly into the left atrium IT is at least   moderately severe . - Left atrium: The atrium was mildly dilated. - Pulmonary arteries: PA peak pressure: 35 mm Hg (S).  TEE 11/09/17: Study Conclusions  - Left ventricle: The cavity size was normal. Wall thickness was   normal. The estimated ejection fraction was 60%. Wall motion was   normal; there were no regional wall motion abnormalities. - Aortic valve: There was no stenosis. There was trivial   regurgitation. - Aorta: Normal caliber aorta with minimal plaque. - Left atrium: The atrium was mildly dilated. No evidence of   thrombus in the atrial cavity or appendage. - Right ventricle: The cavity size was normal. Systolic function   was normal. - Right atrium: No evidence of thrombus in the atrial cavity or   appendage. - Atrial septum: No ASD/PFO by color doppler. - Tricuspid valve: Peak RV-RA gradient (S): 18 mm Hg. - Impressions: Severe mitral regurgitation in setting of bileaflet   MVP and partial flail P2 scallop.  Impressions:  - Severe mitral regurgitation in setting of bileaflet MVP and   partial flail P2 scallop.  Cardiac cath 01/18/18: Procedures   RIGHT/LEFT HEART CATH AND CORONARY ANGIOGRAPHY  Conclusion     The left ventricular systolic function is normal.  LV end diastolic pressure is normal.  The left ventricular ejection fraction is 55-65% by visual estimate.  There is severe (4+) mitral regurgitation  and moderate mitral valve prolapse.   1. Normal coronary anatomy 2. Normal LV function 3. Mitral valve prolapse with severe MR 3-4+ 4. Normal right heart pressures 5. Normal LV filling pressures  No indication for antiplatelet therapy at this time.   Will refer to Dr. Roxy Manns for consideration of MV repair    Minimally-Invasive Mitral Valve Repair Complex valvuloplasty including triangular resection of flail segment (P2) of posterior leaflet Shortening plasty of posterior leaflet Artificial Gore-tex neochord placement x6 Sorin Memo 4D Ring Annuloplasty (size 74mm, catalog #[id number], serial [id number])   Echo 05/12/18: Study Conclusions   - Left ventricle: The cavity size was normal. Wall thickness was  normal. Systolic function was normal. The estimated ejection  fraction was in the range of 50% to 55%. Wall motion was normal;  there were no regional wall motion abnormalities. Doppler  parameters are consistent with abnormal left ventricular  relaxation (grade 1 diastolic dysfunction).  - Aortic valve: There was trivial regurgitation.  - Mitral valve: Prior procedures included surgical repair.   Impressions:   - Low normal LV systolic function; mild diastolic dysfunction;  trace AI; s/p MV repair with no MR.   ASSESSMENT:    1. Severe mitral valve regurgitation   2. Mitral valve prolapse   3. S/P minimally invasive mitral valve repair   4. Hyperlipidemia, unspecified hyperlipidemia type      PLAN:  In order of problems listed above:  1. Mitral valve regurgitation secondary to partially flail P2 segment of the MV with MV prolapse. MR is severe  by TEE with jet directed anteriorly.  Now s/p MV repair on 03/10/18. She has made an excellent recovery and repeat Echo showed no MR.   2. Hyperlipidemia: excellent control on lipitor.  3. Hypothyroidism: Managed by primary care provider. TSH normal.   Follow up in one  year.      Medication Adjustments/Labs and Tests Ordered: Current medicines are reviewed at length with the patient today.  Concerns regarding medicines are outlined above.  Medication changes, Labs and Tests ordered today are listed in the Patient Instructions below. There are no Patient Instructions on file for this visit.   Signed, Smrithi Pigford Martinique, MD  07/04/2019 2:06 PM    [facility] [address], [place], [place]  [address] Phone: [phone]; Fax: [phone]

## 2019-07-03 DIAGNOSIS — R69 Illness, unspecified: Secondary | ICD-10-CM | POA: Diagnosis not present

## 2019-07-04 ENCOUNTER — Other Ambulatory Visit: Payer: Self-pay

## 2019-07-04 ENCOUNTER — Ambulatory Visit: Payer: Medicare HMO | Admitting: Cardiology

## 2019-07-04 ENCOUNTER — Encounter: Payer: Self-pay | Admitting: Cardiology

## 2019-07-04 VITALS — BP 110/78 | HR 85 | Temp 97.3°F | Ht 61.0 in | Wt 153.0 lb

## 2019-07-04 DIAGNOSIS — E785 Hyperlipidemia, unspecified: Secondary | ICD-10-CM | POA: Diagnosis not present

## 2019-07-04 DIAGNOSIS — I341 Nonrheumatic mitral (valve) prolapse: Secondary | ICD-10-CM

## 2019-07-04 DIAGNOSIS — Z9889 Other specified postprocedural states: Secondary | ICD-10-CM

## 2019-07-04 DIAGNOSIS — I340 Nonrheumatic mitral (valve) insufficiency: Secondary | ICD-10-CM

## 2019-07-11 IMAGING — DX DG CHEST 2V
2 series · 2 of 2 positions shown · non-contrast
Comparison: Radiograph March 12, 2018.

CLINICAL DATA: Shortness of breath.

EXAM:
CHEST - 2 VIEW

[chest pa]
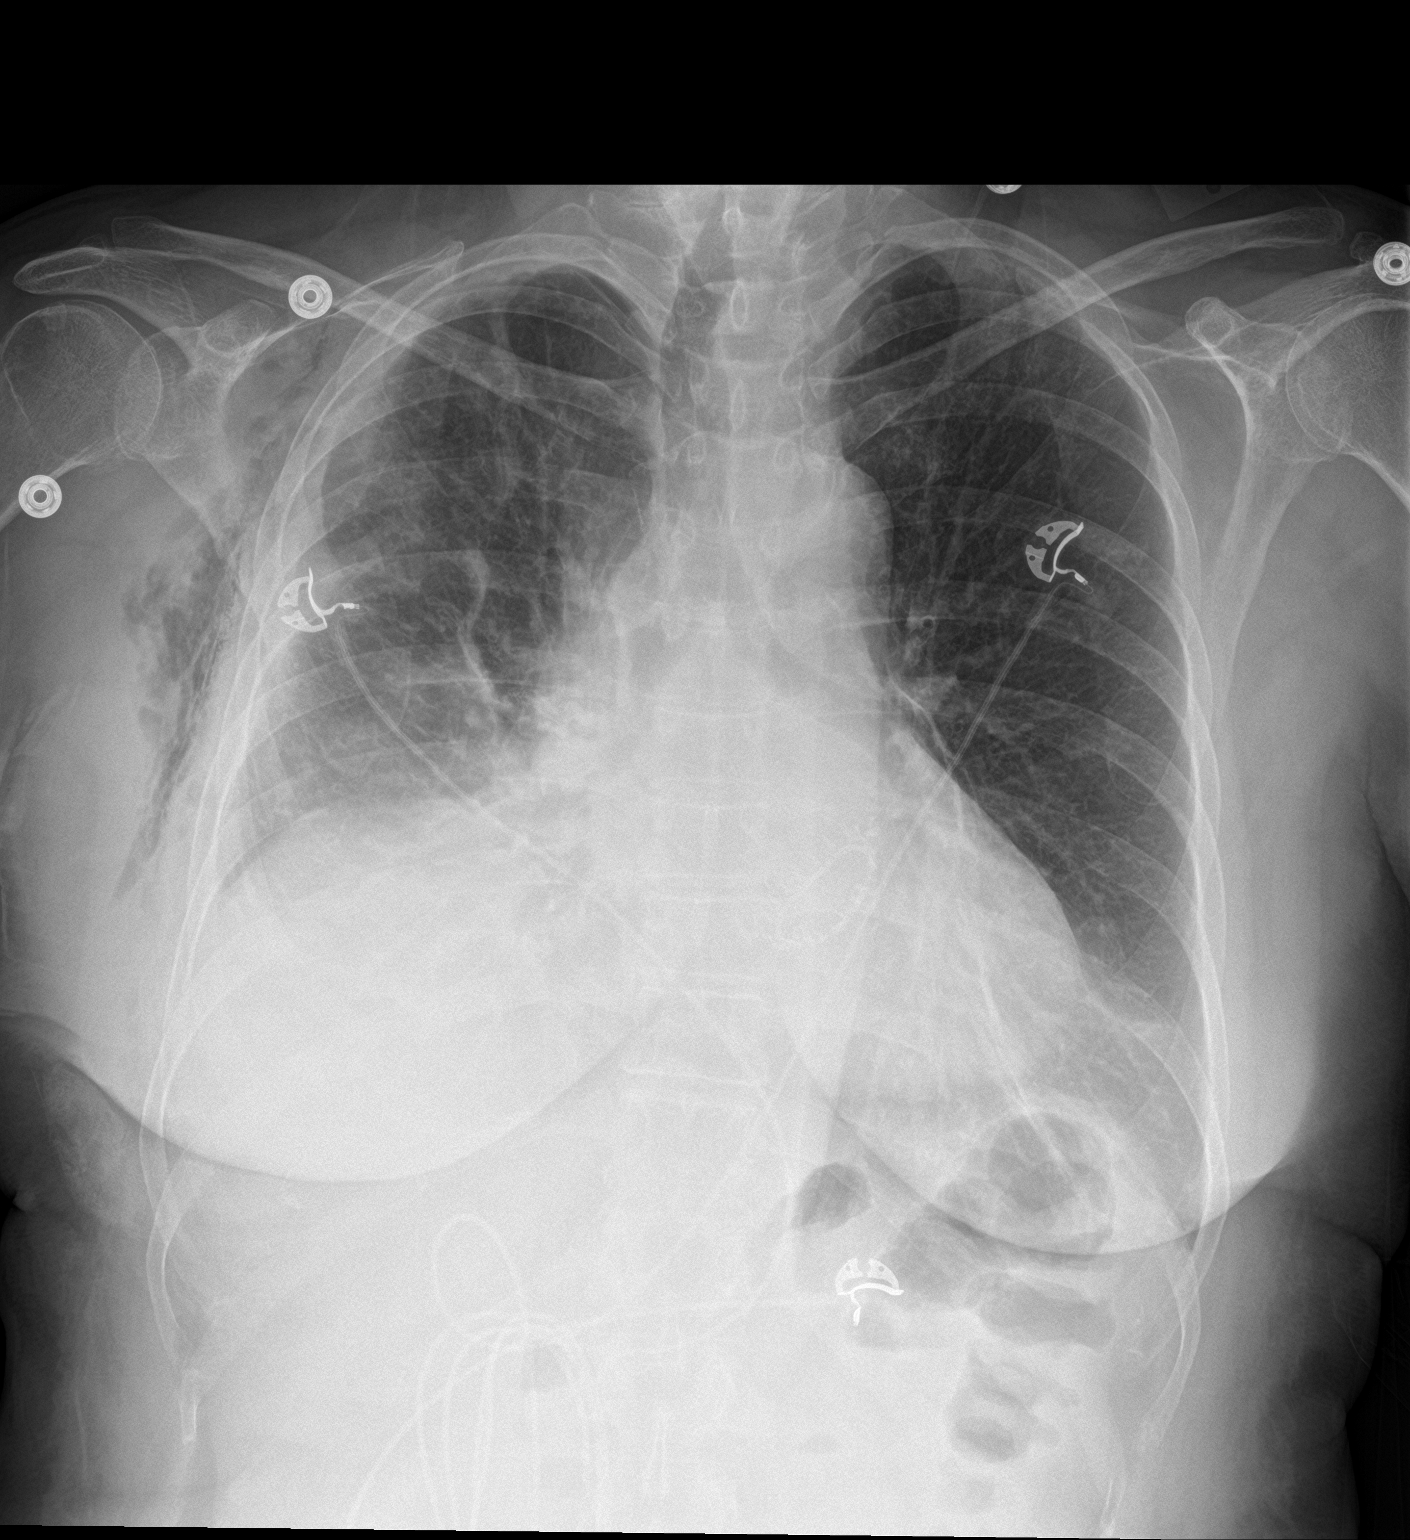

[chest lat]
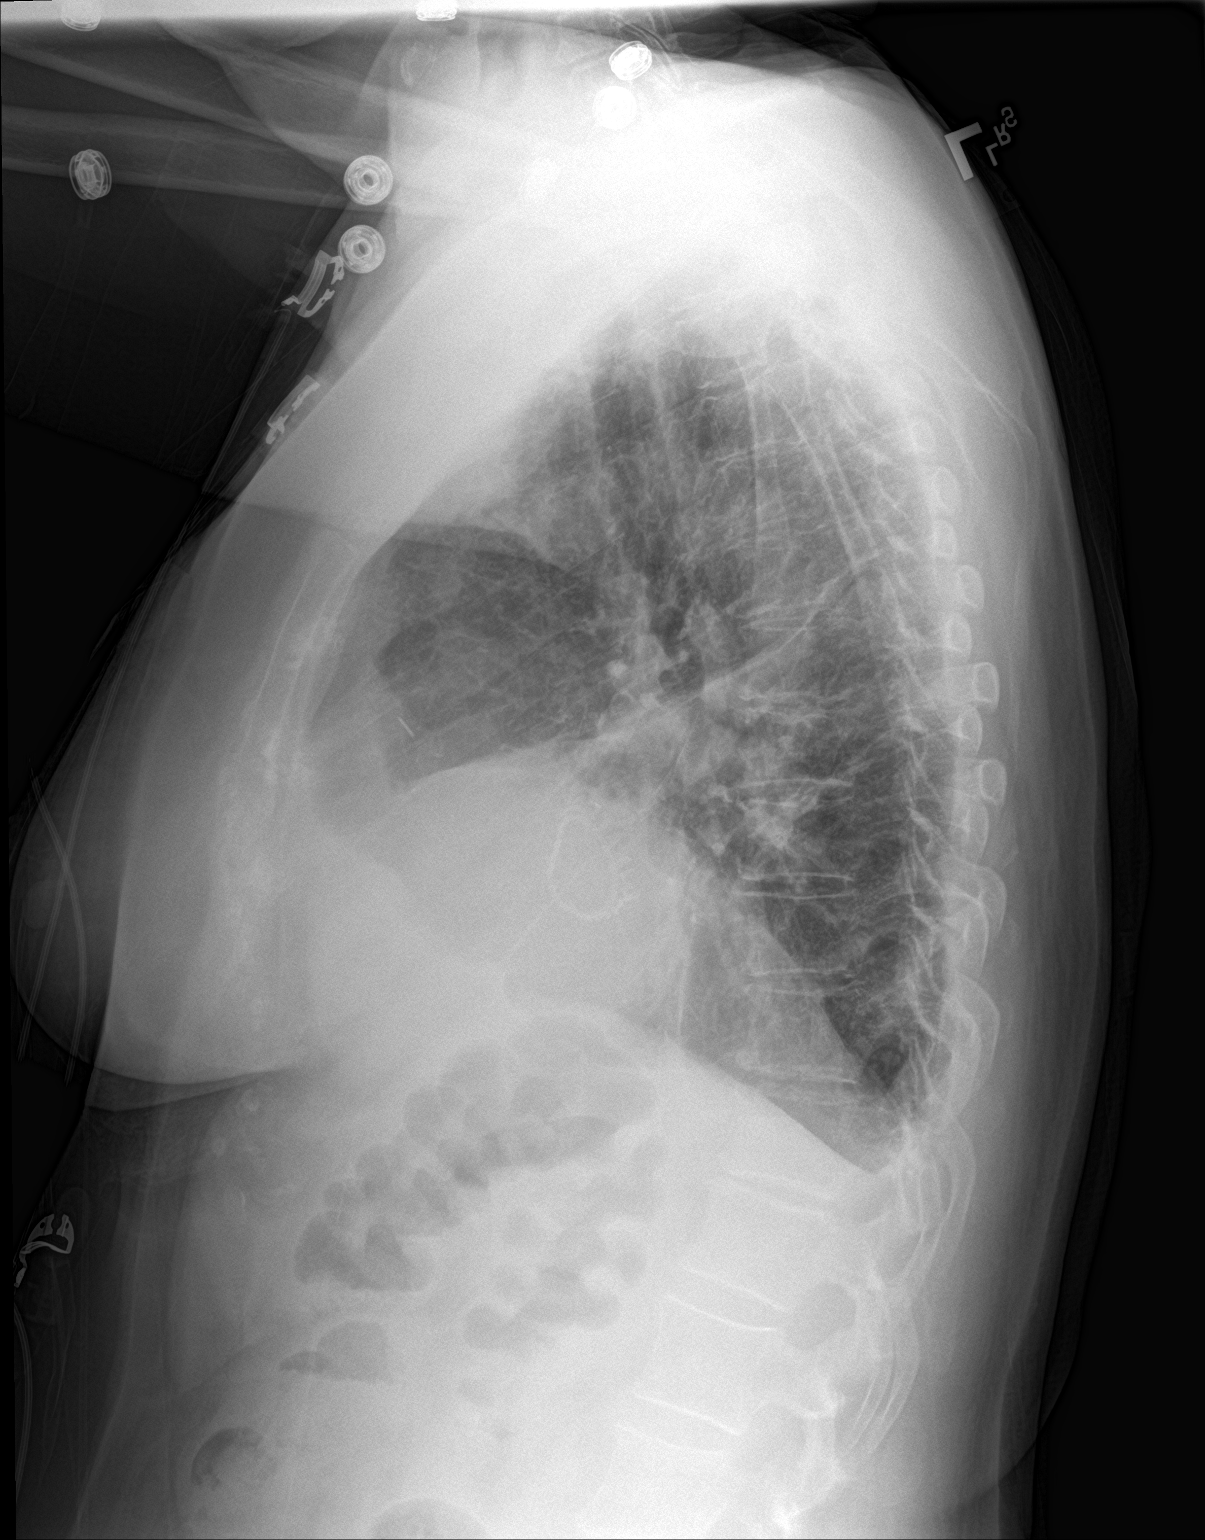

[2 of 2 positions shown; findings below may reference images not displayed]

FINDINGS: Stable cardiomediastinal silhouette. Right-sided chest tubes have
been removed. Small right apical pneumothorax is noted. Subcutaneous
emphysema is seen overlying right lateral chest wall. Mild right
basilar atelectasis or effusion is noted. Left lung is unremarkable.
Status post mitral valve repair. Bony thorax is unremarkable.
IMPRESSION: Small right apical pneumothorax is noted status post right-sided
chest tubes removal. Subcutaneous emphysema is seen over right
lateral chest wall. Mild right basilar subsegmental atelectasis or
effusion is noted.

## 2019-07-19 DIAGNOSIS — R69 Illness, unspecified: Secondary | ICD-10-CM | POA: Diagnosis not present

## 2019-07-26 DIAGNOSIS — R69 Illness, unspecified: Secondary | ICD-10-CM | POA: Diagnosis not present

## 2019-07-31 DIAGNOSIS — H04123 Dry eye syndrome of bilateral lacrimal glands: Secondary | ICD-10-CM | POA: Diagnosis not present

## 2019-08-01 DIAGNOSIS — R69 Illness, unspecified: Secondary | ICD-10-CM | POA: Diagnosis not present

## 2019-08-08 DIAGNOSIS — H04123 Dry eye syndrome of bilateral lacrimal glands: Secondary | ICD-10-CM | POA: Diagnosis not present

## 2019-08-15 DIAGNOSIS — H04123 Dry eye syndrome of bilateral lacrimal glands: Secondary | ICD-10-CM | POA: Diagnosis not present

## 2019-08-29 DIAGNOSIS — R69 Illness, unspecified: Secondary | ICD-10-CM | POA: Diagnosis not present

## 2019-09-05 DIAGNOSIS — R69 Illness, unspecified: Secondary | ICD-10-CM | POA: Diagnosis not present

## 2019-09-21 ENCOUNTER — Telehealth: Payer: Self-pay

## 2019-09-21 ENCOUNTER — Other Ambulatory Visit: Payer: Self-pay | Admitting: Family Medicine

## 2019-09-21 DIAGNOSIS — Z1231 Encounter for screening mammogram for malignant neoplasm of breast: Secondary | ICD-10-CM

## 2019-09-21 DIAGNOSIS — M810 Age-related osteoporosis without current pathological fracture: Secondary | ICD-10-CM

## 2019-09-21 NOTE — Telephone Encounter (Signed)
Order placed for bone density  

## 2019-09-27 ENCOUNTER — Other Ambulatory Visit: Payer: Self-pay | Admitting: Family Medicine

## 2019-09-27 MED ORDER — ALENDRONATE SODIUM 70 MG PO TABS: 70.0000 mg | ORAL_TABLET | ORAL | 3 refills | Status: DC

## 2019-09-28 DIAGNOSIS — R69 Illness, unspecified: Secondary | ICD-10-CM | POA: Diagnosis not present

## 2019-10-22 ENCOUNTER — Other Ambulatory Visit: Payer: Self-pay

## 2019-10-22 ENCOUNTER — Encounter (HOSPITAL_COMMUNITY): Payer: Self-pay | Admitting: Emergency Medicine

## 2019-10-22 ENCOUNTER — Ambulatory Visit (HOSPITAL_COMMUNITY)
Admission: EM | Admit: 2019-10-22 | Discharge: 2019-10-22 | Disposition: A | Discharge status: Home / Self Care | Payer: Medicare HMO | Attending: Emergency Medicine | Admitting: Emergency Medicine

## 2019-10-22 DIAGNOSIS — R1030 Lower abdominal pain, unspecified: Secondary | ICD-10-CM | POA: Diagnosis not present

## 2019-10-22 DIAGNOSIS — R350 Frequency of micturition: Secondary | ICD-10-CM | POA: Insufficient documentation

## 2019-10-22 DIAGNOSIS — N390 Urinary tract infection, site not specified: Secondary | ICD-10-CM

## 2019-10-22 DIAGNOSIS — R300 Dysuria: Secondary | ICD-10-CM | POA: Diagnosis not present

## 2019-10-22 LAB — POCT URINALYSIS DIP (DEVICE)
Bilirubin Urine: NEGATIVE
Glucose, UA: NEGATIVE mg/dL
Ketones, ur: NEGATIVE mg/dL
Nitrite: NEGATIVE
Protein, ur: NEGATIVE mg/dL
Specific Gravity, Urine: 1.025 (ref 1.005–1.030)
Urobilinogen, UA: 0.2 mg/dL (ref 0.0–1.0)
pH: 8.5 — ABNORMAL HIGH (ref 5.0–8.0)

## 2019-10-22 MED ORDER — CIPROFLOXACIN HCL 500 MG PO TABS: 500.0000 mg | ORAL_TABLET | Freq: Two times a day (BID) | ORAL | 0 refills | Status: DC

## 2019-10-22 NOTE — ED Triage Notes (Signed)
Pt c/o uti sx including burning and pain with urination. Pt has not taken any OTC pain relievers.

## 2019-10-22 NOTE — Discharge Instructions (Signed)
You may have a urinary tract infection. We are going to culture your urine and will call you as soon as we have the results.   Drink plenty of water, 8-10 glasses per day.   You may take AZO over the counter for painful urination.  Follow up with your primary care provider as needed.   Go to the Emergency Department if you experience severe pain, shortness of breath, high fever, or other concerns.   

## 2019-10-23 LAB — URINE CULTURE: Culture: <10000 — AB

## 2019-10-24 ENCOUNTER — Other Ambulatory Visit: Payer: Self-pay | Admitting: Family Medicine

## 2019-10-25 NOTE — ED Provider Notes (Signed)
[facility]   CC: UTI  SUBJECTIVE:  Maria Gomez is a 74 y.o. female who complains of urinary frequency, urgency and dysuria for the past 7 days. Patient denies a precipitating event, recent sexual encounter, excessive caffeine intake. Localizes the pain to the lower abdomen. Pain is intermittent and describes it as burning. Has not taken any OTC medications for this. Symptoms are made worse with urination. Admits to similar symptoms in the past. Denies fever, chills, nausea, vomiting, abdominal pain, flank pain, abnormal vaginal discharge or bleeding, hematuria.    LMP: No LMP recorded. Patient is postmenopausal.  ROS: As in HPI.  All other pertinent ROS negative.     Past Medical History:  Diagnosis Date  . Hypothyroidism   . Mitral regurgitation   . Osteopenia   . S/P minimally invasive mitral valve repair 03/10/2018   Complex valvuloplasty including triangular resection of posterior leaflet, artificial Gore-tex neochord x6 and Size 30 Sorin Memo 4D Ring SN [id number] via right mini thoracotomy approach  . Thyroid nodule 03/07/2018   Incidental - noted on CT scan, atypical cells found on fine-needle aspiration.  General surgery consultation and excision recommended February 2020   Past Surgical History:  Procedure Laterality Date  . BREAST EXCISIONAL BIOPSY Bilateral   . MITRAL VALVE REPAIR Right 03/10/2018   Procedure: MINIMALLY INVASIVE MITRAL VALVE REPAIR (MVR):  -Triangular Resection of Posterior Leaflet P2; -Shortening Plasty of Posterior Leaflet P2; -Placement of Goretex Neo Chords x 6 using Chord-X system; -Ring Annuloplasty with a 30 mm Sorin Memo 4D Ring;  Surgeon: Rexene Alberts, MD;  Location: [place];  Service: Open Heart Surgery;  Laterality: Right;  . RIGHT/LEFT HEART CATH AND CORONARY ANGIOGRAPHY N/A 01/18/2018   Procedure: RIGHT/LEFT HEART CATH AND CORONARY ANGIOGRAPHY;  Surgeon: Martinique, Peter M, MD;  Location: [place] [facility];  Service:  Cardiovascular;  Laterality: N/A;  . TEE WITHOUT CARDIOVERSION N/A 11/09/2017   Procedure: TRANSESOPHAGEAL ECHOCARDIOGRAM (TEE);  Surgeon: Larey Dresser, MD;  Location: [facility]COPY;  Service: Cardiovascular;  Laterality: N/A;  . TEE WITHOUT CARDIOVERSION N/A 03/10/2018   Procedure: TRANSESOPHAGEAL ECHOCARDIOGRAM (TEE);  Surgeon: Rexene Alberts, MD;  Location: [place];  Service: Open Heart Surgery;  Laterality: N/A;   Allergies  Allergen Reactions  . Niaspan [Niacin Er] Other (See Comments)    Severe flushing  . Phenazopyridine Hcl Other (See Comments)    Made sick   . Statins     Severe flushing   No current facility-administered medications on file prior to encounter.   Current Outpatient Medications on File Prior to Encounter  Medication Sig Dispense Refill  . alendronate (FOSAMAX) 70 MG tablet Take 1 tablet (70 mg total) by mouth every 7 (seven) days. Take with a full glass of water on an empty stomach.Sit upright for 30 minutes after taking 12 tablet 3  . aspirin EC 81 MG EC tablet Take 1 tablet (81 mg total) by mouth daily.    Marland Kitchen atorvastatin (LIPITOR) 20 MG tablet Take 1 tablet (20 mg total) by mouth daily. 90 tablet 1  . Biotin 5000 MCG TABS Take 5,000 mcg by mouth daily.     . Calcium Carb-Cholecalciferol (CALTRATE 600+D3 PO) Take by mouth.    . Coenzyme Q10 (CO Q 10 PO) Take 800 mg by mouth daily.     . Melatonin 5 MG CAPS Take 10 mg by mouth at bedtime.    . Multiple Vitamin (MULTIVITAMIN) tablet Take 1 tablet by mouth daily. Centrum Silver    .  Polyethylene Glycol 400 (BLINK TEARS) 0.25 % SOLN Apply to eye.    . vitamin B-12 (CYANOCOBALAMIN) 1000 MCG tablet Take 1,000 mcg by mouth daily.    Marland Kitchen zinc gluconate 50 MG tablet Take 50 mg by mouth daily.     Social History   Socioeconomic History  . Marital status: Widowed    Spouse name: Not on file  . Number of children: Not on file  . Years of education: Not on file  . Highest education level: Not on file  Occupational  History  . Not on file  Tobacco Use  . Smoking status: Never Smoker  . Smokeless tobacco: Never Used  Substance and Sexual Activity  . Alcohol use: No  . Drug use: No  . Sexual activity: Not on file  Other Topics Concern  . Not on file  Social History Narrative   Lives with her mom.    Takes care of aunt who lives across the street.    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         Social Determinants of Health   Financial Resource Strain:   . Difficulty of Paying Living Expenses:   Food Insecurity:   . Worried About Charity fundraiser in the Last Year:   . Arboriculturist in the Last Year:   Transportation Needs:   . Film/video editor (Medical):   Marland Kitchen Lack of Transportation (Non-Medical):   Physical Activity:   . Days of Exercise per Week:   . Minutes of Exercise per Session:   Stress:   . Feeling of Stress :   Social Connections:   . Frequency of Communication with Friends and Family:   . Frequency of Social Gatherings with Friends and Family:   . Attends Religious Services:   . Active Member of Clubs or Organizations:   . Attends Archivist Meetings:   Marland Kitchen Marital Status:   Intimate Partner Violence:   . Fear of Current or Ex-Partner:   . Emotionally Abused:   Marland Kitchen Physically Abused:   . Sexually Abused:    Family History  Problem Relation Age of Onset  . Heart disease Father   . Cancer Brother 51       ColoRectal Cancer  . Cancer Maternal Grandmother 54  . Stroke Maternal Grandfather 55  . Stroke Paternal Grandfather     OBJECTIVE:  Vitals:   10/22/19 1524  BP: 111/78  Pulse: 86  Resp: 16  Temp: 99.2 F (37.3 C)  TempSrc: Oral  SpO2: 99%   General appearance: AOx3 in no acute distress HEENT: NCAT.  Oropharynx clear.  Lungs: clear to auscultation bilaterally without adventitious breath sounds Heart: regular rate and rhythm.  Radial pulses 2+ symmetrical bilaterally Abdomen: soft; non-distended; suprapubic  tenderness; bowel sounds present; no guarding or rebound tenderness Back: no CVA tenderness Extremities: no edema; symmetrical with no gross deformities Skin: warm and dry Neurologic: Ambulates from chair to exam table without difficulty Psychological: alert and cooperative; normal mood and affect  Labs Reviewed  URINE CULTURE - Abnormal; Notable for the following components:      Result Value   Culture   (*)    Value: <10,000 COLONIES/mL INSIGNIFICANT GROWTH Performed at [facility], [address] [place], [place] [address]    All other components within normal limits  POCT URINALYSIS DIP (DEVICE) - Abnormal; Notable for the following components:   Hgb urine dipstick SMALL (*)  pH 8.5 (*)    Leukocytes,Ua SMALL (*)    All other components within normal limits    ASSESSMENT & PLAN:  1. Dysuria   2. Urinary frequency   3. Lower abdominal pain     Meds ordered this encounter  Medications  . ciprofloxacin (CIPRO) 500 MG tablet    Sig: Take 1 tablet (500 mg total) by mouth 2 (two) times daily.    Dispense:  14 tablet    Refill:  0    Order Specific Question:   Supervising Provider    Answer:   Chase Picket [id number]    Dysuria Urinary Frequency Low Abd Pain UA concerning for infection with pH at 8.5 Prescribed Cipro Take as directed and to completion Urine culture sent.  We will call you with abnormal results that need further treatment.   Push fluids and get plenty of rest.   Take pyridium as prescribed and as needed for symptomatic relief Follow up with PCP if symptoms persists Return here or go to ER if you have any new or worsening symptoms such as fever, worsening abdominal pain, nausea/vomiting, flank pain  Outlined signs and symptoms indicating need for more acute intervention. Patient verbalized understanding. After Visit Summary given.      Faustino Congress, NP 10/25/19 [phone]

## 2019-11-06 ENCOUNTER — Other Ambulatory Visit: Payer: Self-pay

## 2019-11-06 ENCOUNTER — Ambulatory Visit
Admission: RE | Admit: 2019-11-06 | Discharge: 2019-11-06 | Disposition: A | Discharge status: Home / Self Care | Payer: Medicare HMO | Source: Ambulatory Visit | Attending: Family Medicine | Admitting: Family Medicine

## 2019-11-06 DIAGNOSIS — Z1231 Encounter for screening mammogram for malignant neoplasm of breast: Secondary | ICD-10-CM

## 2019-11-08 DIAGNOSIS — R69 Illness, unspecified: Secondary | ICD-10-CM | POA: Diagnosis not present

## 2019-11-29 ENCOUNTER — Other Ambulatory Visit: Payer: Self-pay | Admitting: Family Medicine

## 2019-12-04 DIAGNOSIS — R69 Illness, unspecified: Secondary | ICD-10-CM | POA: Diagnosis not present

## 2019-12-06 ENCOUNTER — Other Ambulatory Visit: Payer: Self-pay

## 2019-12-06 ENCOUNTER — Ambulatory Visit
Admission: RE | Admit: 2019-12-06 | Discharge: 2019-12-06 | Disposition: A | Discharge status: Home / Self Care | Payer: Medicare HMO | Source: Ambulatory Visit | Attending: Family Medicine | Admitting: Family Medicine

## 2019-12-06 DIAGNOSIS — Z780 Asymptomatic menopausal state: Secondary | ICD-10-CM | POA: Diagnosis not present

## 2019-12-06 DIAGNOSIS — M810 Age-related osteoporosis without current pathological fracture: Secondary | ICD-10-CM

## 2019-12-06 DIAGNOSIS — M85852 Other specified disorders of bone density and structure, left thigh: Secondary | ICD-10-CM | POA: Diagnosis not present

## 2019-12-07 DIAGNOSIS — Z79899 Other long term (current) drug therapy: Secondary | ICD-10-CM | POA: Diagnosis not present

## 2019-12-13 DIAGNOSIS — R69 Illness, unspecified: Secondary | ICD-10-CM | POA: Diagnosis not present

## 2019-12-21 ENCOUNTER — Other Ambulatory Visit: Payer: Self-pay

## 2019-12-21 ENCOUNTER — Ambulatory Visit (INDEPENDENT_AMBULATORY_CARE_PROVIDER_SITE_OTHER): Payer: Medicare HMO | Admitting: Family Medicine

## 2019-12-21 VITALS — BP 110/80 | HR 97 | Temp 96.7°F | Ht 61.0 in | Wt 157.0 lb

## 2019-12-21 DIAGNOSIS — I340 Nonrheumatic mitral (valve) insufficiency: Secondary | ICD-10-CM

## 2019-12-21 DIAGNOSIS — M810 Age-related osteoporosis without current pathological fracture: Secondary | ICD-10-CM | POA: Diagnosis not present

## 2019-12-21 DIAGNOSIS — E7800 Pure hypercholesterolemia, unspecified: Secondary | ICD-10-CM

## 2019-12-21 DIAGNOSIS — E039 Hypothyroidism, unspecified: Secondary | ICD-10-CM

## 2019-12-21 NOTE — Progress Notes (Signed)
Subjective:    Patient ID: Maria Gomez, female    DOB: 1945/09/18, 74 y.o.   MRN: [id number]  Medication Refill    03/2018 Patient had heart surgery approximately 1 month ago to repair mitral valve.  She is done extremely well postoperatively and has been started on Coumadin.  Unfortunately, she is developed a chronic nonproductive cough that she describes as an itch or a tingle in the back of her throat.  It tends to be worse during the day when she is up walking around while she is trying to talk to people or breathing cool air.  He denies any hemoptysis.  She denies any chest pain.  She denies any shortness of breath.  She denies any fevers or chills.  She denies any recent travel or exposures.  She tried Zyrtec on her own as well as Tessalon without any relief.  She denies any heartburn.  At that time, my plan was: Patient's exam is unremarkable.  She is therapeutic on Coumadin and therefore I believe it pulmonary embolism after surgery is unlikely.  Differential diagnosis includes upper airway cough syndrome, chronic bronchitis, laryngo-esophageal reflux, nerve irritation in the throat secondary to intubation causing a chronic cough, infection although unlikely.  Recommended a chest x-ray.  Start the patient on a combination of Flonase 2 sprays each nostril daily for possible postnasal drip coupled with Protonix 40 mg daily for possible silent reflux.  Obtain chest x-ray to rule out infection.  Start tramadol for nerve irritation 50 mg p.o. every 8 hours.  Recheck in 1 week.  If cough persist consider additional work-up or empiric trial of inhaled steroids for possible cough variant asthma  06/20/18 Patient has a history of carotid bruits.  She had carotid ultrasounds performed in October of last year.  The results are dictated below:  Right Carotid: Velocities in the right ICA are consistent with a 1-39% stenosis.  Left Carotid: Velocities in the left ICA are consistent with a  1-39% stenosis. Vertebrals: Left vertebral artery demonstrates antegrade flow. Right vertebral             artery demonstrates high resistant flow.  She is on aspirin 81 mg a day but not a statin.  Patient has discontinued Coumadin and is now only on aspirin.  On a CT scan obtained by her cardiothoracic surgeon last fall, there was a coincidental finding of a 2.9 cm heterogeneous left thyroid nodule.  They recommended that she follow-up with me to discuss this.  This nodule is not palpable today on exam.  However it certainly meets size criteria for a biopsy.  She also reports a one-week history of dysuria, urgency, frequency.  Urinalysis is significant for blood as well as leukocyte esterase.  She denies any low back pain or fevers or chills or nausea or vomiting.  However at the end of the encounter, her visit took an unusual turn.  She states that her son has not spoken to her since Christmas.  She states that her son says she needs counseling.  When I questioned the patient as to why, she states that it is because she told her son the truth.  When I inquired as to what she was referencing, she states that she told her son how her daughter-in-law's brother is sabotaging her life.  This person works for the [employer] according to the patient.  According to the patient he has hacked into the all the electrical devices in her home.  He has messed  up her TV so that it will not work.  He has messed up her dehumidifier in her basement.  He is tapped her phones.  He is sabotaging her friend's cell phones and making them not work.  He is calling her at odd times and having her called by random telemarketer's.  Because of this individual she is receiving lots of spam mail.  When I questioned the patient as to why he would go to see she links to terrorized her and make her life miserable, she states is because she gave her family herpes.  She lists numerous electronic devices such as the TV the telephone in the dehumidifier  the computers the iPad is the cell phones that he has "hacked into and sabotaged".  When she told her son the truth about what was going on, her daughter-in-law became defensive and they have cut off all communication with her until she gets help.  She denies any suicidal ideation or homicidal ideation.  At that time, my plan was: Patient is due for lab work.  I have recommended that she return fasting for a CBC, CMP, fasting lipid panel, and a TSH.  Her goal LDL cholesterol is less than 70 given her carotid stenosis.  She is taking aspirin.  I want to ensure that her levothyroxine dose is appropriate.  I will treat her urinary tract infection with Keflex 500 mg p.o. 3 times daily for 1 week.  I will schedule the patient for a thyroid ultrasound to confirm the presence of the thyroid nodule.  If present and greater than 1 cm it will need a fine-needle aspiration to rule out thyroid malignancy.  However I spent more than 35 minutes with the patient today discussing her paranoid delusions about her extended family "sabotaging all the electrical devices in her home".  The story seems farfetched.  The patient states that she is not crazy.  She has insight into how while the story sounds however she is convinced that he is doing all these things in an effort to terrorized her and punish her.  This is definitely a fixed false belief and qualifies as delusional.  Therefore I have recommended psychiatry consultation.  I will arrange this for the patient.  12/06/18 Thyroid biopsy revealed atypical cells.  Confirmatory Afirma testing revealed a likely benign nodule with less than 4% chance for malignancy.  Patient is here today for a complete physical exam.  We discussed the biopsy results.  We discussed a referral to surgery for excisional biopsy versus clinical monitoring with repeat ultrasound in 1 year.  Patient elects to repeat the ultrasound in 1 year.  She is already scheduled her mammogram.  Due to her age she does  not require a Pap smear.  Her last colonoscopy recommended a repeat colonoscopy in 5 years.  This was performed in 2015.  Despite the colonoscopy being clear she has a family history of a brother who died from colon cancer at 51.  Therefore she is due this year.  She declines allowing me to schedule this today.  I question the patient about the situation in her family.  She states that her relationship with her son and her daughter-in-law have improved.  She still reports of paranoia regarding her daughter-in-law's brother.  She still states that he is tapping into her phones.  She denies any depression or anxiety or hallucinations although this appears to be a fixed delusion.  She declines referral to psychiatry at the present time.  Her  blood pressure today is elevated 138/98.  At that time, my plan was: Physical exam today is normal aside from the patient's delusions regarding her daughter-in-law's brother.  She declines referral to a counselor or therapist at the present time.  Blood pressure is elevated.  I reviewed her most recent lab work with her which does show a decline in her kidney function.  I would recommend repeating this in 6 months.  Otherwise her thyroid test is outstanding.  The biopsy of the nodule but came back reassuring.  We will repeat an ultrasound of this in 1 year to monitor it.  She is due for her next bone density next year.  She is already schedule her mammogram.  I recommended a colonoscopy but she declines this at the present time.  Patient is due for Pneumovax 23 which she received today.  Otherwise Prevnar 13 and her shingles vaccine are up-to-date.  She declines any problems with falls or memory loss.  She denies any depression at the present time.   04/24/19 Patient presents today with her son.  First she reports fecal leakage.  She states that stool is coming out uncontrollably almost like toothpaste.  I saw the patient 1 month ago and at that time she was complaining of not  going to the bathroom to defecate for several days in a row.  At that time she was experiencing hard balls of stool.  I recommended trying Amitiza however she stopped that medication due to diarrhea after 1 week.  She states that ever since stool has been leaking out again with a consistency of toothpaste.  She is unable to control it.  She is also overdue to check her thyroid/TSH.  She would like to do that today while she is here.  The patient continues to endorse delusions.  She believes that people are tapping her telephones.  She believes that extended members of her family are sabotaging her telephone and her iPad.  She believes that my office as well as the gastroenterologist office are refusing to see her due to her HPV infection which is not the case.  No amount of reassurance can convince her otherwise.  She is also dealing with anxiety and stress related to these concerns and fears and delusional thoughts.  I have recommended psychiatry but the patient refused that in the past.  However today with both the insistence of myself and her son, the patient is willing to discuss the situation with a psychiatrist.  I believe the patient may have schizoaffective disorder.  Therefore I would strongly recommend getting a second opinion with a psychiatrist as I explained to the patient.  I believe this is better managed by a psychiatrist and a primary care physician.  At that time, my plan was: We will recheck TSH today to ensure adequate dosage of levothyroxine.  Strongly recommended the covid vaccine when available.  Recommended psychiatry consult for delusional thoughts and possible schizoaffective disorder.  I am concerned that the patient may have overflow incontinence due to constipation. Obtain xray to rule out constipation as a cause of possible "encoparesis".  If not, consider bulking agents or even cholestyramine.  Spent 35 minutes with patient today. Consult GI for colon can screening as well.    12/21/19 I asked the patient to come in today for an evaluation as I had not seen her in quite some time.  She is seeing a psychiatrist and they are prescribing respite all.  She states that the situation with  her son has improved dramatically.  She is now able to see her grandchildren.  In fact they have a 43-year-old birthday party that they are planning to attend later today.  She denies any delusional behaviors or depression.  Psychiatrist recently check lab work including a CBC, CMP, fasting lipid panel.  Patient has stable stage III chronic kidney disease with a GFR around 51 mL of blood per minute and a creatinine of 1.07.  Liver function tests are normal.  Cholesterol is outstanding with a total cholesterol around 130, and LDL cholesterol well below 100.  She is tolerating the Lipitor well without any problems.  She is overdue to check a TSH to monitor the management of her hypothyroidism.  She also has a history of osteoporosis.  She recently had a bone density test.  Bone density test showed a T score of -2.7 in the spine which was unchanged from 2019 and a T score of -2.0 in the left hip which was unchanged from 2019.  She states that she has been on Fosamax for more than 5 years Past Medical History:  Diagnosis Date  . Hypothyroidism   . Mitral regurgitation   . Osteopenia   . S/P minimally invasive mitral valve repair 03/10/2018   Complex valvuloplasty including triangular resection of posterior leaflet, artificial Gore-tex neochord x6 and Size 30 Sorin Memo 4D Ring SN [id number] via right mini thoracotomy approach  . Thyroid nodule 03/07/2018   Incidental - noted on CT scan, atypical cells found on fine-needle aspiration.  General surgery consultation and excision recommended February 2020   Past Surgical History:  Procedure Laterality Date  . BREAST EXCISIONAL BIOPSY Bilateral   . MITRAL VALVE REPAIR Right 03/10/2018   Procedure: MINIMALLY INVASIVE MITRAL VALVE REPAIR (MVR):  -Triangular  Resection of Posterior Leaflet P2; -Shortening Plasty of Posterior Leaflet P2; -Placement of Goretex Neo Chords x 6 using Chord-X system; -Ring Annuloplasty with a 30 mm Sorin Memo 4D Ring;  Surgeon: Rexene Alberts, MD;  Location: [facility];  Service: Open Heart Surgery;  Laterality: Right;  . RIGHT/LEFT HEART CATH AND CORONARY ANGIOGRAPHY N/A 01/18/2018   Procedure: RIGHT/LEFT HEART CATH AND CORONARY ANGIOGRAPHY;  Surgeon: Martinique, Peter M, MD;  Location: [facility];  Service: Cardiovascular;  Laterality: N/A;  . TEE WITHOUT CARDIOVERSION N/A 11/09/2017   Procedure: TRANSESOPHAGEAL ECHOCARDIOGRAM (TEE);  Surgeon: Larey Dresser, MD;  Location: [facility];  Service: Cardiovascular;  Laterality: N/A;  . TEE WITHOUT CARDIOVERSION N/A 03/10/2018   Procedure: TRANSESOPHAGEAL ECHOCARDIOGRAM (TEE);  Surgeon: Rexene Alberts, MD;  Location: [facility];  Service: Open Heart Surgery;  Laterality: N/A;   Current Outpatient Medications on File Prior to Visit  Medication Sig Dispense Refill  . alendronate (FOSAMAX) 70 MG tablet Take 1 tablet (70 mg total) by mouth every 7 (seven) days. Take with a full glass of water on an empty stomach.Sit upright for 30 minutes after taking 12 tablet 3  . aspirin EC 81 MG EC tablet Take 1 tablet (81 mg total) by mouth daily.    Marland Kitchen atorvastatin (LIPITOR) 20 MG tablet TAKE 1 TABLET BY MOUTH EVERY DAY 90 tablet 1  . Biotin 5000 MCG TABS Take 5,000 mcg by mouth daily.     . Calcium Carb-Cholecalciferol (CALTRATE 600+D3 PO) Take by mouth.    . Coenzyme Q10 (CO Q 10 PO) Take 800 mg by mouth daily.     Marland Kitchen levothyroxine (SYNTHROID) 25 MCG tablet TAKE 1 TABLET BY MOUTH EVERY DAY BEFORE  BREAKFAST 90 tablet 1  . Melatonin 5 MG CAPS Take 10 mg by mouth at bedtime.    . Multiple Vitamin (MULTIVITAMIN) tablet Take 1 tablet by mouth daily. Centrum Silver    . Polyethylene Glycol 400 (BLINK TEARS) 0.25 % SOLN Apply to eye.    . risperiDONE (RISPERDAL) 1 MG tablet Take 1 mg by mouth at  bedtime.    . vitamin B-12 (CYANOCOBALAMIN) 1000 MCG tablet Take 1,000 mcg by mouth daily.    Marland Kitchen zinc gluconate 50 MG tablet Take 50 mg by mouth daily.     No current facility-administered medications on file prior to visit.   Allergies  Allergen Reactions  . Niaspan [Niacin Er] Other (See Comments)    Severe flushing  . Phenazopyridine Hcl Other (See Comments)    Made sick   . Statins     Severe flushing   Social History   Socioeconomic History  . Marital status: Widowed    Spouse name: Not on file  . Number of children: Not on file  . Years of education: Not on file  . Highest education level: Not on file  Occupational History  . Not on file  Tobacco Use  . Smoking status: Never Smoker  . Smokeless tobacco: Never Used  Vaping Use  . Vaping Use: Never used  Substance and Sexual Activity  . Alcohol use: No  . Drug use: No  . Sexual activity: Not on file  Other Topics Concern  . Not on file  Social History Narrative   Lives with her mom.    Takes care of aunt who lives across the street.    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         Social Determinants of Health   Financial Resource Strain:   . Difficulty of Paying Living Expenses:   Food Insecurity:   . Worried About Charity fundraiser in the Last Year:   . Arboriculturist in the Last Year:   Transportation Needs:   . Film/video editor (Medical):   Marland Kitchen Lack of Transportation (Non-Medical):   Physical Activity:   . Days of Exercise per Week:   . Minutes of Exercise per Session:   Stress:   . Feeling of Stress :   Social Connections:   . Frequency of Communication with Friends and Family:   . Frequency of Social Gatherings with Friends and Family:   . Attends Religious Services:   . Active Member of Clubs or Organizations:   . Attends Archivist Meetings:   Marland Kitchen Marital Status:   Intimate Partner Violence:   . Fear of Current or Ex-Partner:   . Emotionally  Abused:   Marland Kitchen Physically Abused:   . Sexually Abused:       Review of Systems  All other systems reviewed and are negative.      Objective:   Physical Exam Vitals reviewed.  Constitutional:      General: She is not in acute distress.    Appearance: Normal appearance. She is normal weight. She is not ill-appearing, toxic-appearing or diaphoretic.  HENT:     Head: Normocephalic and atraumatic.     Right Ear: Tympanic membrane, ear canal and external ear normal. There is no impacted cerumen.     Left Ear: Tympanic membrane, ear canal and external ear normal. There is no impacted cerumen.     Nose: Nose normal. No congestion or rhinorrhea.  Mouth/Throat:     Pharynx: No oropharyngeal exudate or posterior oropharyngeal erythema.  Eyes:     General: No scleral icterus.       Right eye: No discharge.        Left eye: No discharge.     Extraocular Movements: Extraocular movements intact.     Conjunctiva/sclera: Conjunctivae normal.     Pupils: Pupils are equal, round, and reactive to light.  Neck:     Thyroid: No thyromegaly.     Vascular: No carotid bruit or JVD.     Trachea: No tracheal deviation.  Cardiovascular:     Rate and Rhythm: Normal rate and regular rhythm.     Heart sounds: Murmur heard.  No friction rub. No gallop.   Pulmonary:     Effort: Pulmonary effort is normal. No respiratory distress.     Breath sounds: Normal breath sounds. No stridor. No wheezing, rhonchi or rales.  Chest:     Chest wall: No tenderness.  Abdominal:     General: Bowel sounds are normal. There is no distension.     Palpations: Abdomen is soft. There is no mass.     Tenderness: There is no abdominal tenderness. There is no right CVA tenderness, left CVA tenderness, guarding or rebound.     Hernia: No hernia is present.  Musculoskeletal:        General: No swelling, tenderness or deformity.     Cervical back: Neck supple. No muscular tenderness.     Right lower leg: No edema.     Left  lower leg: No edema.  Lymphadenopathy:     Cervical: No cervical adenopathy.  Skin:    General: Skin is warm.     Coloration: Skin is not jaundiced or pale.     Findings: No bruising, erythema, lesion or rash.  Neurological:     General: No focal deficit present.     Mental Status: She is alert and oriented to person, place, and time. Mental status is at baseline.     Cranial Nerves: No cranial nerve deficit.     Sensory: No sensory deficit.     Motor: No weakness.     Coordination: Coordination normal.     Gait: Gait normal.     Deep Tendon Reflexes: Reflexes normal.  Psychiatric:        Mood and Affect: Mood normal.        Behavior: Behavior normal.        Thought Content: Thought content normal.        Judgment: Judgment normal.             Assessment & Plan:  Hypothyroidism, unspecified type - Plan: TSH  Osteoporosis without current pathological fracture, unspecified osteoporosis type  Non-rheumatic mitral regurgitation  Pure hypercholesterolemia  Check a TSH to ensure adequate dosage of her levothyroxine.  Patient is due for a break from her bisphosphonate as she has been on Fosamax for more than 5 years per her report.  Therefore we will temporarily discontinue Fosamax.  Strongly encouraged her to take 1200 mg a day of calcium and 1000 units a day of vitamin D I would recommend rechecking a bone density test in 2 years and if her T score starts to decline resume a bisphosphonate.  I do believe the patient can discontinue her aspirin.  Recent fasting lipid panel was outstanding.  Make no changes in her cholesterol medication.  Chronic kidney disease is stable.  Recommended she avoid NSAIDs and drink plenty  of water.  Blood pressure today is well controlled at 110/80.

## 2019-12-22 LAB — TSH: TSH: 2.49 mIU/L (ref 0.40–4.50)

## 2020-01-23 DIAGNOSIS — R69 Illness, unspecified: Secondary | ICD-10-CM | POA: Diagnosis not present

## 2020-02-13 DIAGNOSIS — R69 Illness, unspecified: Secondary | ICD-10-CM | POA: Diagnosis not present

## 2020-03-13 DIAGNOSIS — R69 Illness, unspecified: Secondary | ICD-10-CM | POA: Diagnosis not present

## 2020-05-06 ENCOUNTER — Other Ambulatory Visit: Payer: Self-pay | Admitting: Family Medicine

## 2020-05-20 DIAGNOSIS — L578 Other skin changes due to chronic exposure to nonionizing radiation: Secondary | ICD-10-CM | POA: Diagnosis not present

## 2020-05-20 DIAGNOSIS — L570 Actinic keratosis: Secondary | ICD-10-CM | POA: Diagnosis not present

## 2020-05-20 DIAGNOSIS — D1801 Hemangioma of skin and subcutaneous tissue: Secondary | ICD-10-CM | POA: Diagnosis not present

## 2020-05-20 DIAGNOSIS — L814 Other melanin hyperpigmentation: Secondary | ICD-10-CM | POA: Diagnosis not present

## 2020-05-20 DIAGNOSIS — L821 Other seborrheic keratosis: Secondary | ICD-10-CM | POA: Diagnosis not present

## 2020-05-28 DIAGNOSIS — R69 Illness, unspecified: Secondary | ICD-10-CM | POA: Diagnosis not present

## 2020-06-05 DIAGNOSIS — R69 Illness, unspecified: Secondary | ICD-10-CM | POA: Diagnosis not present

## 2020-06-09 ENCOUNTER — Other Ambulatory Visit: Payer: Self-pay | Admitting: Family Medicine

## 2020-06-29 NOTE — Progress Notes (Signed)
Cardiology Office Note    Date:  07/03/2020   ID:  Ahmarie, Un 07-30-1945, MRN [id number]  PCP:  Susy Frizzle, MD  Cardiologist:  Dr. Martinique  Chief Complaint  Patient presents with  . Mitral Valve Prolapse    History of Present Illness:  Maria Gomez is a 75 y.o. female seen for followup Mitral insufficiency. She was intially evaluated by Almyra Deforest PA-C on 11/03/17 for evaluation of a heart murmur. She has a  PMH of hypothyroidism and HLD but no prior cardiac history.  On the previous office visit with his primary care provider in March 2018, a heart murmur was heard.  Echocardiogram obtained on 09/01/2016 showed EF 60 to 65%, mild LVH with moderate focal basal septal hypertrophy, grade 1 DD, thickened mitral leaflet with posterior leaflet prolapse, mild MR, moderate LAE, PA peak pressure was 21 mmHg.  Carotid Doppler obtained on 09/04/2016 showed no significant disease bilaterally.  Echocardiogram was repeated on 09/20/2017, this showed EF 55 to 60%, bileaflet prolapse of mitral valve with at least moderate to severe MR directed anteriorly into the left atrium, PA peak pressure 35 mmHg. She subsequently underwent TEE showing bileaflet MVP with a flail P2 segment and severe MR. She subsequently underwent [facility] on 01/18/18 confirming severe MR. Normal coronary arteries. Normal right heart pressures.   She had minimally invasive mitral valve repair on 03/10/2018 by Dr. Roxy Manns. She did very well with this and repeat Echo showed resolution of her MR.   On follow up she denies any chest pain, dyspnea, edema, palpitations. She feels very well. She is caring for her 44 yo mother. She does try and walk when it is not so cold.   Past Medical History:  Diagnosis Date  . Hypothyroidism   . Mitral regurgitation   . Osteopenia   . S/P minimally invasive mitral valve repair 03/10/2018   Complex valvuloplasty including triangular resection of posterior leaflet, artificial  Gore-tex neochord x6 and Size 30 Sorin Memo 4D Ring SN [id number] via right mini thoracotomy approach  . Thyroid nodule 03/07/2018   Incidental - noted on CT scan, atypical cells found on fine-needle aspiration.  General surgery consultation and excision recommended February 2020    Past Surgical History:  Procedure Laterality Date  . BREAST EXCISIONAL BIOPSY Bilateral   . MITRAL VALVE REPAIR Right 03/10/2018   Procedure: MINIMALLY INVASIVE MITRAL VALVE REPAIR (MVR):  -Triangular Resection of Posterior Leaflet P2; -Shortening Plasty of Posterior Leaflet P2; -Placement of Goretex Neo Chords x 6 using Chord-X system; -Ring Annuloplasty with a 30 mm Sorin Memo 4D Ring;  Surgeon: Rexene Alberts, MD;  Location: [place];  Service: Open Heart Surgery;  Laterality: Right;  . RIGHT/LEFT HEART CATH AND CORONARY ANGIOGRAPHY N/A 01/18/2018   Procedure: RIGHT/LEFT HEART CATH AND CORONARY ANGIOGRAPHY;  Surgeon: Gomez, Robben Jagiello M, MD;  Location: [facility];  Service: Cardiovascular;  Laterality: N/A;  . TEE WITHOUT CARDIOVERSION N/A 11/09/2017   Procedure: TRANSESOPHAGEAL ECHOCARDIOGRAM (TEE);  Surgeon: Larey Dresser, MD;  Location: [facility] ENDOSCOPY;  Service: Cardiovascular;  Laterality: N/A;  . TEE WITHOUT CARDIOVERSION N/A 03/10/2018   Procedure: TRANSESOPHAGEAL ECHOCARDIOGRAM (TEE);  Surgeon: Rexene Alberts, MD;  Location: [place];  Service: Open Heart Surgery;  Laterality: N/A;    Current Medications: Outpatient Medications Prior to Visit  Medication Sig Dispense Refill  . atorvastatin (LIPITOR) 20 MG tablet TAKE 1 TABLET BY MOUTH EVERY DAY 90 tablet 1  . Biotin 5000 MCG TABS  Take 5,000 mcg by mouth daily.     . Calcium Carb-Cholecalciferol (CALTRATE 600+D3 PO) Take by mouth.    . Coenzyme Q10 (CO Q 10 PO) Take 800 mg by mouth daily.     Marland Kitchen levothyroxine (SYNTHROID) 25 MCG tablet TAKE 1 TABLET BY MOUTH EVERY DAY BEFORE BREAKFAST 90 tablet 1  . Melatonin 5 MG CAPS Take 10 mg by mouth at bedtime.    .  Multiple Vitamin (MULTIVITAMIN) tablet Take 1 tablet by mouth daily. Centrum Silver    . polyethylene glycol (GAVILYTE-G) 236 g solution Take as directed by office instructions    . Polyethylene Glycol 400 0.25 % SOLN Apply to eye.    . risperiDONE (RISPERDAL) 1 MG tablet Take 1 mg by mouth at bedtime.    . vitamin B-12 (CYANOCOBALAMIN) 1000 MCG tablet Take 1,000 mcg by mouth daily.    Marland Kitchen zinc gluconate 50 MG tablet Take 50 mg by mouth daily.    Marland Kitchen alendronate (FOSAMAX) 70 MG tablet Take 1 tablet (70 mg total) by mouth every 7 (seven) days. Take with a full glass of water on an empty stomach.Sit upright for 30 minutes after taking 12 tablet 3  . aspirin EC 81 MG EC tablet Take 1 tablet (81 mg total) by mouth daily.     No facility-administered medications prior to visit.     Allergies:   Niaspan [niacin er], Phenazopyridine hcl, and Statins   Social History   Socioeconomic History  . Marital status: Widowed    Spouse name: Not on file  . Number of children: Not on file  . Years of education: Not on file  . Highest education level: Not on file  Occupational History  . Not on file  Tobacco Use  . Smoking status: Never Smoker  . Smokeless tobacco: Never Used  Vaping Use  . Vaping Use: Never used  Substance and Sexual Activity  . Alcohol use: No  . Drug use: No  . Sexual activity: Not on file  Other Topics Concern  . Not on file  Social History Narrative   Lives with her mom.    Takes care of aunt who lives across the street.    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         Social Determinants of Health   Financial Resource Strain: Not on file  Food Insecurity: Not on file  Transportation Needs: Not on file  Physical Activity: Not on file  Stress: Not on file  Social Connections: Not on file     Family History:  The patient's family history includes Cancer (age of onset: 58) in her brother; Cancer (age of onset: 17) in her maternal grandmother;  Heart disease in her father; Stroke in her paternal grandfather; Stroke (age of onset: 59) in her maternal grandfather.   ROS:   Please see the history of present illness.    ROS All other systems reviewed and are negative.   PHYSICAL EXAM:   VS:  BP 106/72   Pulse 94   Ht 5\' 1"  (1.549 m)   Wt 152 lb 9.6 oz (69.2 kg)   BMI 28.83 kg/m    GENERAL:  Well appearing overweight WF in NAD HEENT:  PERRL, EOMI, sclera are clear. Oropharynx is clear. NECK:  No jugular venous distention, carotid upstroke brisk and symmetric, no bruits, no thyromegaly or adenopathy LUNGS:  Clear to auscultation bilaterally CHEST:  Unremarkable HEART:  RRR,  PMI  not displaced or sustained,S1 and S2 within normal limits, no S3, no S4: no clicks, no rubs, no murmur ABD:  Soft, nontender. BS +, no masses or bruits. No hepatomegaly, no splenomegaly EXT:  2 + pulses throughout, no edema, no cyanosis no clubbing. Radial cath site has healed well. SKIN:  Warm and dry.  No rashes NEURO:  Alert and oriented x 3. Cranial nerves II through XII intact. PSYCH:  Cognitively intact    Wt Readings from Last 3 Encounters:  07/03/20 152 lb 9.6 oz (69.2 kg)  12/21/19 157 lb (71.2 kg)  07/04/19 153 lb (69.4 kg)      Studies/Labs Reviewed:   EKG:  EKG is  ordered today.  NSR with LAD.  Otherwise normal. Rate 94. I have personally reviewed and interpreted this study.   Recent Labs: 12/21/2019: TSH 2.49   Lipid Panel    Component Value Date/Time   CHOL 152 11/30/2018 0951   TRIG 136 11/30/2018 0951   HDL 60 11/30/2018 0951   CHOLHDL 2.5 11/30/2018 0951   VLDL 31 (H) 08/11/2016 0857   LDLCALC 70 11/30/2018 0951    Additional studies/ records that were reviewed today include:   Echo 09/01/2016 LV EF: 60% -   65% Study Conclusions  - Left ventricle: The cavity size was normal. Wall thickness was   increased in a pattern of mild LVH. There was moderate focal   basal hypertrophy of the septum. Systolic function  was normal.   The estimated ejection fraction was in the range of 60% to 65%.   Wall motion was normal; there were no regional wall motion   abnormalities. Doppler parameters are consistent with abnormal   left ventricular relaxation (grade 1 diastolic dysfunction). The   E/e&' ratio is >15, suggesting elevated LV filling pressure. - Aortic valve: Trileaflet. Sclerosis without stenosis. There was   no regurgitation. - Mitral valve: Thickened leaflets with posterior leaflet prolapse.   Trace to mild regurgitation. - Left atrium: The atrium was moderately dilated. - Tricuspid valve: There was trivial regurgitation. - Pulmonary arteries: PA peak pressure: 21 mm Hg (S). - Inferior vena cava: The vessel was normal in size. The   respirophasic diameter changes were in the normal range (>= 50%),   consistent with normal central venous pressure.  Impressions:  - LVEF 60-65%, mild LVH with moderate focal basal septal   hypertrophy, normal wall motion, grade 1 DD with elevated LV   filling pressure, aortic valve sclerosis, posterior mitral valve   leaflet prolapse with mild to moderate anteriorly directed mitral   regurgitation, moderate LAE, trivial TR, RVSP 21 mmHg, normal   IVC.   Echo 09/20/2017 LV EF: 55% -   60% Study Conclusions  - Left ventricle: The cavity size was normal. Wall thickness was   increased in a pattern of mild LVH. Systolic function was normal.   The estimated ejection fraction was in the range of 55% to 60%. - Mitral valve: There is bileaflet prolapse of the MV MR is   directed anteirorly into the left atrium IT is at least   moderately severe . - Left atrium: The atrium was mildly dilated. - Pulmonary arteries: PA peak pressure: 35 mm Hg (S).  TEE 11/09/17: Study Conclusions  - Left ventricle: The cavity size was normal. Wall thickness was   normal. The estimated ejection fraction was 60%. Wall motion was   normal; there were no regional wall motion  abnormalities. - Aortic valve: There was no stenosis. There  was trivial   regurgitation. - Aorta: Normal caliber aorta with minimal plaque. - Left atrium: The atrium was mildly dilated. No evidence of   thrombus in the atrial cavity or appendage. - Right ventricle: The cavity size was normal. Systolic function   was normal. - Right atrium: No evidence of thrombus in the atrial cavity or   appendage. - Atrial septum: No ASD/PFO by color doppler. - Tricuspid valve: Peak RV-RA gradient (S): 18 mm Hg. - Impressions: Severe mitral regurgitation in setting of bileaflet   MVP and partial flail P2 scallop.  Impressions:  - Severe mitral regurgitation in setting of bileaflet MVP and   partial flail P2 scallop.  Cardiac cath 01/18/18: Procedures   RIGHT/LEFT HEART CATH AND CORONARY ANGIOGRAPHY  Conclusion     The left ventricular systolic function is normal.  LV end diastolic pressure is normal.  The left ventricular ejection fraction is 55-65% by visual estimate.  There is severe (4+) mitral regurgitation and moderate mitral valve prolapse.   1. Normal coronary anatomy 2. Normal LV function 3. Mitral valve prolapse with severe MR 3-4+ 4. Normal right heart pressures 5. Normal LV filling pressures  No indication for antiplatelet therapy at this time.   Will refer to Dr. Roxy Manns for consideration of MV repair    Minimally-Invasive Mitral Valve Repair Complex valvuloplasty including triangular resection of flail segment (P2) of posterior leaflet Shortening plasty of posterior leaflet Artificial Gore-tex neochord placement x6 Sorin Memo 4D Ring Annuloplasty (size 34mm, catalog #[id number], serial #[id number])   Echo 05/12/18: Study Conclusions   - Left ventricle: The cavity size was normal. Wall thickness was  normal. Systolic function was normal. The estimated ejection  fraction was in the range of 50% to 55%. Wall motion was  normal;  there were no regional wall motion abnormalities. Doppler  parameters are consistent with abnormal left ventricular  relaxation (grade 1 diastolic dysfunction).  - Aortic valve: There was trivial regurgitation.  - Mitral valve: Prior procedures included surgical repair.   Impressions:   - Low normal LV systolic function; mild diastolic dysfunction;  trace AI; s/p MV repair with no MR.   ASSESSMENT:    1. Severe mitral valve regurgitation   2. S/P minimally invasive mitral valve repair   3. Hyperlipidemia, unspecified hyperlipidemia type      PLAN:  In order of problems listed above:  1. Mitral valve regurgitation secondary to partially flail P2 segment of the MV with MV prolapse. MR is severe by TEE with jet directed Repeat Echo showed no MR. No murmur on exam  2. Hyperlipidemia: excellent control on lipitor.  3. Hypothyroidism: Managed by primary care provider. TSH normal.   Follow up in one year.      Medication Adjustments/Labs and Tests Ordered: Current medicines are reviewed at length with the patient today.  Concerns regarding medicines are outlined above.  Medication changes, Labs and Tests ordered today are listed in the Patient Instructions below. There are no Patient Instructions on file for this visit.   Signed, Maria Fern Martinique, MD  07/03/2020 10:06 AM    [facility] [address], [place], [place]  [address] Phone: [phone]; Fax: [phone]

## 2020-07-03 ENCOUNTER — Other Ambulatory Visit: Payer: Self-pay

## 2020-07-03 ENCOUNTER — Encounter: Payer: Self-pay | Admitting: Cardiology

## 2020-07-03 ENCOUNTER — Ambulatory Visit: Payer: Medicare HMO | Admitting: Cardiology

## 2020-07-03 VITALS — BP 106/72 | HR 94 | Ht 61.0 in | Wt 152.6 lb

## 2020-07-03 DIAGNOSIS — Z9889 Other specified postprocedural states: Secondary | ICD-10-CM | POA: Diagnosis not present

## 2020-07-03 DIAGNOSIS — I340 Nonrheumatic mitral (valve) insufficiency: Secondary | ICD-10-CM | POA: Diagnosis not present

## 2020-07-03 DIAGNOSIS — E785 Hyperlipidemia, unspecified: Secondary | ICD-10-CM | POA: Diagnosis not present

## 2020-07-15 DIAGNOSIS — R69 Illness, unspecified: Secondary | ICD-10-CM | POA: Diagnosis not present

## 2020-08-12 DIAGNOSIS — R69 Illness, unspecified: Secondary | ICD-10-CM | POA: Diagnosis not present

## 2020-08-20 DIAGNOSIS — H2513 Age-related nuclear cataract, bilateral: Secondary | ICD-10-CM | POA: Diagnosis not present

## 2020-08-20 DIAGNOSIS — H524 Presbyopia: Secondary | ICD-10-CM | POA: Diagnosis not present

## 2020-08-20 DIAGNOSIS — H04123 Dry eye syndrome of bilateral lacrimal glands: Secondary | ICD-10-CM | POA: Diagnosis not present

## 2020-09-09 DIAGNOSIS — R69 Illness, unspecified: Secondary | ICD-10-CM | POA: Diagnosis not present

## 2020-09-20 DIAGNOSIS — H04123 Dry eye syndrome of bilateral lacrimal glands: Secondary | ICD-10-CM | POA: Diagnosis not present

## 2020-09-24 DIAGNOSIS — Z0100 Encounter for examination of eyes and vision without abnormal findings: Secondary | ICD-10-CM | POA: Diagnosis not present

## 2020-10-04 ENCOUNTER — Other Ambulatory Visit: Payer: Self-pay | Admitting: Family Medicine

## 2020-10-04 DIAGNOSIS — Z1231 Encounter for screening mammogram for malignant neoplasm of breast: Secondary | ICD-10-CM

## 2020-10-08 ENCOUNTER — Telehealth: Payer: Self-pay | Admitting: Family Medicine

## 2020-10-08 NOTE — Telephone Encounter (Signed)
No answer unable to leave a message for patient to call back and schedule Medicare Annual Wellness Visit (AWV) in office.   If not able to come in office, please offer to do virtually or by telephone.   Last AWV: 12/06/2018   Please schedule at anytime with [facility]Nurse Health Advisor.  If any questions, please contact me at [phone]

## 2020-10-09 NOTE — Telephone Encounter (Signed)
Patient is scheduled with [facility] for 10/24/2020

## 2020-10-16 DIAGNOSIS — R69 Illness, unspecified: Secondary | ICD-10-CM | POA: Diagnosis not present

## 2020-10-24 ENCOUNTER — Ambulatory Visit (INDEPENDENT_AMBULATORY_CARE_PROVIDER_SITE_OTHER): Payer: Medicare HMO | Admitting: *Deleted

## 2020-10-24 ENCOUNTER — Other Ambulatory Visit: Payer: Self-pay

## 2020-10-24 VITALS — BP 116/82 | Temp 98.6°F | Wt 153.0 lb

## 2020-10-24 DIAGNOSIS — Z0000 Encounter for general adult medical examination without abnormal findings: Secondary | ICD-10-CM

## 2020-10-24 NOTE — Progress Notes (Signed)
Subjective:   Maria Gomez is a 75 y.o. female who presents for Medicare Annual (Subsequent) preventive examination.  Review of Systems    N/A Cardiac Risk Factors include: advanced age (>22men, >97 women);dyslipidemia;hypertension     Objective:    Today's Vitals   10/24/20 1043  BP: 116/82  Temp: 98.6 F (37 C)  TempSrc: Oral  Weight: 153 lb (69.4 kg)   Body mass index is 28.91 kg/m.  Advanced Directives 10/24/2020 04/11/2018 03/11/2018 03/07/2018 01/18/2018 11/09/2017  Does Patient Have a Medical Advance Directive? No Yes Yes Yes Yes Yes  Type of Advance Directive - Healthcare Power of [place];Living will [place];Living will [place];Living will [place];Living will  Does patient want to make changes to medical advance directive? - No - Patient declined No - Patient declined No - Patient declined No - Patient declined -  Copy of [place] in Chart? - No - copy requested No - copy requested No - copy requested - No - copy requested  Would patient like information on creating a medical advance directive? No - Patient declined - - - - -    Current Medications (verified) Outpatient Encounter Medications as of 10/24/2020  Medication Sig  . atorvastatin (LIPITOR) 20 MG tablet TAKE 1 TABLET BY MOUTH EVERY DAY  . Biotin 5000 MCG TABS Take 5,000 mcg by mouth daily.   . Calcium Carb-Cholecalciferol (CALTRATE 600+D3 PO) Take by mouth.  . Coenzyme Q10 (CO Q 10 PO) Take 800 mg by mouth daily.   Marland Kitchen levothyroxine (SYNTHROID) 25 MCG tablet TAKE 1 TABLET BY MOUTH EVERY DAY BEFORE BREAKFAST  . Melatonin 5 MG CAPS Take 10 mg by mouth at bedtime.  . Multiple Vitamin (MULTIVITAMIN) tablet Take 1 tablet by mouth daily. Centrum Silver  . polyethylene glycol (GAVILYTE-G) 236 g solution Take as directed by office instructions  . Polyethylene Glycol 400 0.25 % SOLN Apply to eye.  .  risperiDONE (RISPERDAL) 1 MG tablet Take 1 mg by mouth at bedtime.  . vitamin B-12 (CYANOCOBALAMIN) 1000 MCG tablet Take 1,000 mcg by mouth daily.  Marland Kitchen zinc gluconate 50 MG tablet Take 50 mg by mouth daily.   No facility-administered encounter medications on file as of 10/24/2020.    Allergies (verified) Niaspan [niacin er], Phenazopyridine hcl, and Statins   History: Past Medical History:  Diagnosis Date  . Hypothyroidism   . Mitral regurgitation   . Osteopenia   . S/P minimally invasive mitral valve repair 03/10/2018   Complex valvuloplasty including triangular resection of posterior leaflet, artificial Gore-tex neochord x6 and Size 30 Sorin Memo 4D Ring SN [id number] via right mini thoracotomy approach  . Thyroid nodule 03/07/2018   Incidental - noted on CT scan, atypical cells found on fine-needle aspiration.  General surgery consultation and excision recommended February 2020   Past Surgical History:  Procedure Laterality Date  . BREAST EXCISIONAL BIOPSY Bilateral   . MITRAL VALVE REPAIR Right 03/10/2018   Procedure: MINIMALLY INVASIVE MITRAL VALVE REPAIR (MVR):  -Triangular Resection of Posterior Leaflet P2; -Shortening Plasty of Posterior Leaflet P2; -Placement of Goretex Neo Chords x 6 using Chord-X system; -Ring Annuloplasty with a 30 mm Sorin Memo 4D Ring;  Surgeon: Rexene Alberts, MD;  Location: [facility];  Service: Open Heart Surgery;  Laterality: Right;  . RIGHT/LEFT HEART CATH AND CORONARY ANGIOGRAPHY N/A 01/18/2018   Procedure: RIGHT/LEFT HEART CATH AND CORONARY ANGIOGRAPHY;  Surgeon: Martinique, Peter M,  MD;  Location: [facility];  Service: Cardiovascular;  Laterality: N/A;  . TEE WITHOUT CARDIOVERSION N/A 11/09/2017   Procedure: TRANSESOPHAGEAL ECHOCARDIOGRAM (TEE);  Surgeon: Larey Dresser, MD;  Location: [facility];  Service: Cardiovascular;  Laterality: N/A;  . TEE WITHOUT CARDIOVERSION N/A 03/10/2018   Procedure: TRANSESOPHAGEAL ECHOCARDIOGRAM (TEE);  Surgeon: Rexene Alberts, MD;  Location: [facility];  Service: Open Heart Surgery;  Laterality: N/A;   Family History  Problem Relation Age of Onset  . Heart disease Father   . Cancer Brother 47       ColoRectal Cancer  . Cancer Maternal Grandmother 28  . Stroke Maternal Grandfather 28  . Stroke Paternal Grandfather    Social History   Socioeconomic History  . Marital status: Widowed    Spouse name: Not on file  . Number of children: Not on file  . Years of education: Not on file  . Highest education level: Not on file  Occupational History  . Not on file  Tobacco Use  . Smoking status: Never Smoker  . Smokeless tobacco: Never Used  Vaping Use  . Vaping Use: Never used  Substance and Sexual Activity  . Alcohol use: No  . Drug use: No  . Sexual activity: Not on file  Other Topics Concern  . Not on file  Social History Narrative   Lives with her mom.    Takes care of aunt who lives across the street.    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         Social Determinants of Health   Financial Resource Strain: Not on file  Food Insecurity: No Food Insecurity  . Worried About Charity fundraiser in the Last Year: Never true  . Ran Out of Food in the Last Year: Never true  Transportation Needs: No Transportation Needs  . Lack of Transportation (Medical): No  . Lack of Transportation (Non-Medical): No  Physical Activity: Sufficiently Active  . Days of Exercise per Week: 4 days  . Minutes of Exercise per Session: 40 min  Stress: No Stress Concern Present  . Feeling of Stress : Not at all  Social Connections: Moderately Isolated  . Frequency of Communication with Friends and Family: More than three times a week  . Frequency of Social Gatherings with Friends and Family: More than three times a week  . Attends Religious Services: More than 4 times per year  . Active Member of Clubs or Organizations: No  . Attends Archivist Meetings: Never  . Marital  Status: Widowed    Tobacco Counseling Counseling given: Not Answered   Clinical Intake:  Pre-visit preparation completed: Yes  Pain : No/denies pain     Nutritional Risks: None Diabetes: No  How often do you need to have someone help you when you read instructions, pamphlets, or other written materials from your doctor or pharmacy?: 1 - Never  Diabetic?no  Interpreter Needed?: No  Information entered by :: Leroy Kennedy LPN   Activities of Daily Living In your present state of health, do you have any difficulty performing the following activities: 10/24/2020  Hearing? N  Vision? N  Difficulty concentrating or making decisions? N  Walking or climbing stairs? N  Doing errands, shopping? N  Preparing Food and eating ? N  Using the Toilet? N  In the past six months, have you accidently leaked urine? N  Do you have problems with loss of  bowel control? N  Managing your Medications? N  Managing your Finances? N  Some recent data might be hidden    Patient Care Team: Susy Frizzle, MD as PCP - General (Family Medicine) Martinique, Peter M, MD as PCP - Cardiology (Cardiology)  Indicate any recent Medical Services you may have received from other than [facility] providers in the past year (date may be approximate).     Assessment:   This is a routine wellness examination for Azuree.  Hearing/Vision screen  Hearing Screening   125Hz  250Hz  500Hz  1000Hz  2000Hz  3000Hz  4000Hz  6000Hz  8000Hz   Right ear:           Left ear:           Vision Screening Comments: Dr Windell Moulding  Up to date due May 2023  Dietary issues and exercise activities discussed: Current Exercise Habits: Home exercise routine;Structured exercise class, Type of exercise: walking;yoga;Other - see comments (SWIMMING), Time (Minutes): 40, Frequency (Times/Week): 4, Weekly Exercise (Minutes/Week): 160, Intensity: Moderate  Goals Addressed            This Visit's Progress   . Patient Stated       Maintain  current exercise regimen      Depression Screen PHQ 2/9 Scores 10/24/2020 12/06/2018 04/11/2018 04/04/2018 08/23/2017 05/10/2017 08/17/2016  PHQ - 2 Score 0 3 0 0 0 0 0  PHQ- 9 Score - 6 2 - - - 0    Fall Risk Fall Risk  10/24/2020 12/06/2018 04/11/2018 04/04/2018 08/23/2017  Falls in the past year? 0 0 0 0 No  Number falls in past yr: 0 - - - -  Injury with Fall? 0 - - - -  Follow up Falls prevention discussed;Falls evaluation completed Falls evaluation completed - - -    FALL RISK PREVENTION PERTAINING TO THE HOME:  Any stairs in or around the home? No  If so, are there any without handrails? No  Home free of loose throw rugs in walkways, pet beds, electrical cords, etc? Yes  Adequate lighting in your home to reduce risk of falls? Yes   ASSISTIVE DEVICES UTILIZED TO PREVENT FALLS:  Life alert? No  Use of a cane, walker or w/c? Yes  Grab bars in the bathroom? Yes  Shower chair or bench in shower? Yes  Elevated toilet seat or a handicapped toilet? No   TIMED UP AND GO:  Was the test performed? Yes .  Length of time to ambulate 10 feet: 20 sec.   Gait steady and fast without use of assistive device  Cognitive Function:        Immunizations Immunization History  Administered Date(s) Administered  . Fluad Quad(high Dose 65+) 01/20/2019  . Influenza, High Dose Seasonal PF 03/01/2017, 02/15/2018, 01/05/2019  . Influenza-Unspecified 04/08/2012, 03/25/2013, 03/29/2014  . PFIZER(Purple Top)SARS-COV-2 Vaccination 06/19/2019, 07/10/2019, 03/01/2020  . Pneumococcal Conjugate-13 06/26/2013  . Pneumococcal Polysaccharide-23 12/06/2018  . Td 10/24/1996  . Tdap 04/08/2012  . Zoster, Live 05/20/2012    TDAP status: Up to date  Flu Vaccine status: Up to date  Pneumococcal vaccine status: Up to date  Covid-19 vaccine status: Completed vaccines  Qualifies for Shingles Vaccine? Yes   Zostavax completed Yes   Shingrix Completed?: No.    Education has been provided regarding the  importance of this vaccine. Patient has been advised to call insurance company to determine out of pocket expense if they have not yet received this vaccine. Advised may also receive vaccine at local pharmacy or Health Dept.  Verbalized acceptance and understanding.  Screening Tests Health Maintenance  Topic Date Due  . URINE MICROALBUMIN  Never done  . Hepatitis C Screening  Never done  . Zoster Vaccines- Shingrix (1 of 2) Never done  . COVID-19 Vaccine (4 - Booster for Pfizer series) 06/01/2020  . INFLUENZA VACCINE  12/23/2020  . MAMMOGRAM  11/05/2021  . TETANUS/TDAP  04/08/2022  . COLONOSCOPY (Pts 45-31yrs Insurance coverage will need to be confirmed)  01/12/2024  . DEXA SCAN  Completed  . PNA vac Low Risk Adult  Completed  . HPV VACCINES  Aged Out    Health Maintenance  Health Maintenance Due  Topic Date Due  . URINE MICROALBUMIN  Never done  . Hepatitis C Screening  Never done  . Zoster Vaccines- Shingrix (1 of 2) Never done  . COVID-19 Vaccine (4 - Booster for Pfizer series) 06/01/2020    Colorectal cancer screening: Type of screening: Colonoscopy. Completed 01/11/2014. Repeat every 10 years  Mammogram status: Completed 11/06/2019. Repeat every year  Bone Density status: Completed 12/06/2019. Results reflect: Bone density results: OSTEOPENIA. Repeat every 5 years.  Lung Cancer Screening: (Low Dose CT Chest recommended if Age 33-80 years, 30 pack-year currently smoking OR have quit w/in 15years.) does not qualify.   Lung Cancer Screening Referral:  N/A  Additional Screening:  Hepatitis C Screening: does qualify;   Vision Screening: Recommended annual ophthalmology exams for early detection of glaucoma and other disorders of the eye. Is the patient up to date with their annual eye exam?  Yes  Who is the provider or what is the name of the office in which the patient attends annual eye exams? Dr. Windell Moulding If pt is not established with a provider, would they like  to be referred to a provider to establish care? Yes .   Dental Screening: Recommended annual dental exams for proper oral hygiene  Community Resource Referral / Chronic Care Management: CRR required this visit?  No   CCM required this visit?  No      Plan:     I have personally reviewed and noted the following in the patient's chart:   . Medical and social history . Use of alcohol, tobacco or illicit drugs  . Current medications and supplements including opioid prescriptions.  . Functional ability and status . Nutritional status . Physical activity . Advanced directives . List of other physicians . Hospitalizations, surgeries, and ER visits in previous 12 months . Vitals . Screenings to include cognitive, depression, and falls . Referrals and appointments  In addition, I have reviewed and discussed with patient certain preventive protocols, quality metrics, and best practice recommendations. A written personalized care plan for preventive services as well as general preventive health recommendations were provided to patient.     Leroy Kennedy, LPN   12/30/1101   Nurse Notes: N/A

## 2020-10-24 NOTE — Patient Instructions (Addendum)
[name] , Thank you for taking time to come for your Medicare Wellness Visit. I appreciate your ongoing commitment to your health goals. Please review the following plan we discussed and let me know if I can assist you in the future.   Screening recommendations/referrals: Colonoscopy: Up to date, next due 01/12/2024 Mammogram: Up to date, please keep upcoming appointment in July Bone Density: Up to date, next due 12/06/2024 Recommended yearly ophthalmology/optometry visit for glaucoma screening and checkup Recommended yearly dental visit for hygiene and checkup  Vaccinations: Influenza vaccine: Up to date, next due fall 2022  Pneumococcal vaccine: Completed series  Tdap vaccine: Up to date, next due 04/08/2022 Shingles vaccine: Currently due for Shingrix, if you would like to receive you may do so at your local pharmacy     Advanced directives: Copies of advanced directive paperwork given  Conditions/risks identified: None   Next appointment: 10/30/2021 @ 10:30 am with [place] 65 Years and Older, Female Preventive care refers to lifestyle choices and visits with your health care provider that can promote health and wellness. What does preventive care include?  A yearly physical exam. This is also called an annual well check.  Dental exams once or twice a year.  Routine eye exams. Ask your health care provider how often you should have your eyes checked.  Personal lifestyle choices, including:  Daily care of your teeth and gums.  Regular physical activity.  Eating a healthy diet.  Avoiding tobacco and drug use.  Limiting alcohol use.  Practicing safe sex.  Taking low-dose aspirin every day.  Taking vitamin and mineral supplements as recommended by your health care provider. What happens during an annual well check? The services and screenings done by your health care provider during your annual well check will depend on your age,  overall health, lifestyle risk factors, and family history of disease. Counseling  Your health care provider may ask you questions about your:  Alcohol use.  Tobacco use.  Drug use.  Emotional well-being.  Home and relationship well-being.  Sexual activity.  Eating habits.  History of falls.  Memory and ability to understand (cognition).  Work and work Statistician.  Reproductive health. Screening  You may have the following tests or measurements:  Height, weight, and BMI.  Blood pressure.  Lipid and cholesterol levels. These may be checked every 5 years, or more frequently if you are over 17 years old.  Skin check.  Lung cancer screening. You may have this screening every year starting at age 7 if you have a 30-pack-year history of smoking and currently smoke or have quit within the past 15 years.  Fecal occult blood test (FOBT) of the stool. You may have this test every year starting at age 63.  Flexible sigmoidoscopy or colonoscopy. You may have a sigmoidoscopy every 5 years or a colonoscopy every 10 years starting at age 78.  Hepatitis C blood test.  Hepatitis B blood test.  Sexually transmitted disease (STD) testing.  Diabetes screening. This is done by checking your blood sugar (glucose) after you have not eaten for a while (fasting). You may have this done every 1-3 years.  Bone density scan. This is done to screen for osteoporosis. You may have this done starting at age 31.  Mammogram. This may be done every 1-2 years. Talk to your health care provider about how often you should have regular mammograms. Talk with your health care provider about your test results, treatment  options, and if necessary, the need for more tests. Vaccines  Your health care provider may recommend certain vaccines, such as:  Influenza vaccine. This is recommended every year.  Tetanus, diphtheria, and acellular pertussis (Tdap, Td) vaccine. You may need a Td booster every 10  years.  Zoster vaccine. You may need this after age 61.  Pneumococcal 13-valent conjugate (PCV13) vaccine. One dose is recommended after age 54.  Pneumococcal polysaccharide (PPSV23) vaccine. One dose is recommended after age 1. Talk to your health care provider about which screenings and vaccines you need and how often you need them. This information is not intended to replace advice given to you by your health care provider. Make sure you discuss any questions you have with your health care provider. Document Released: 06/07/2015 Document Revised: 01/29/2016 Document Reviewed: 03/12/2015 Elsevier Interactive Patient Education  2017 [place] Prevention in the Home Falls can cause injuries. They can happen to people of all ages. There are many things you can do to make your home safe and to help prevent falls. What can I do on the outside of my home?  Regularly fix the edges of walkways and driveways and fix any cracks.  Remove anything that might make you trip as you walk through a door, such as a raised step or threshold.  Trim any bushes or trees on the path to your home.  Use bright outdoor lighting.  Clear any walking paths of anything that might make someone trip, such as rocks or tools.  Regularly check to see if handrails are loose or broken. Make sure that both sides of any steps have handrails.  Any raised decks and porches should have guardrails on the edges.  Have any leaves, snow, or ice cleared regularly.  Use sand or salt on walking paths during winter.  Clean up any spills in your garage right away. This includes oil or grease spills. What can I do in the bathroom?  Use night lights.  Install grab bars by the toilet and in the tub and shower. Do not use towel bars as grab bars.  Use non-skid mats or decals in the tub or shower.  If you need to sit down in the shower, use a plastic, non-slip stool.  Keep the floor dry. Clean up any water that  spills on the floor as soon as it happens.  Remove soap buildup in the tub or shower regularly.  Attach bath mats securely with double-sided non-slip rug tape.  Do not have throw rugs and other things on the floor that can make you trip. What can I do in the bedroom?  Use night lights.  Make sure that you have a light by your bed that is easy to reach.  Do not use any sheets or blankets that are too big for your bed. They should not hang down onto the floor.  Have a firm chair that has side arms. You can use this for support while you get dressed.  Do not have throw rugs and other things on the floor that can make you trip. What can I do in the kitchen?  Clean up any spills right away.  Avoid walking on wet floors.  Keep items that you use a lot in easy-to-reach places.  If you need to reach something above you, use a strong step stool that has a grab bar.  Keep electrical cords out of the way.  Do not use floor polish or wax that makes floors slippery.  If you must use wax, use non-skid floor wax.  Do not have throw rugs and other things on the floor that can make you trip. What can I do with my stairs?  Do not leave any items on the stairs.  Make sure that there are handrails on both sides of the stairs and use them. Fix handrails that are broken or loose. Make sure that handrails are as long as the stairways.  Check any carpeting to make sure that it is firmly attached to the stairs. Fix any carpet that is loose or worn.  Avoid having throw rugs at the top or bottom of the stairs. If you do have throw rugs, attach them to the floor with carpet tape.  Make sure that you have a light switch at the top of the stairs and the bottom of the stairs. If you do not have them, ask someone to add them for you. What else can I do to help prevent falls?  Wear shoes that:  Do not have high heels.  Have rubber bottoms.  Are comfortable and fit you well.  Are closed at the  toe. Do not wear sandals.  If you use a stepladder:  Make sure that it is fully opened. Do not climb a closed stepladder.  Make sure that both sides of the stepladder are locked into place.  Ask someone to hold it for you, if possible.  Clearly mark and make sure that you can see:  Any grab bars or handrails.  First and last steps.  Where the edge of each step is.  Use tools that help you move around (mobility aids) if they are needed. These include:  Canes.  Walkers.  Scooters.  Crutches.  Turn on the lights when you go into a dark area. Replace any light bulbs as soon as they burn out.  Set up your furniture so you have a clear path. Avoid moving your furniture around.  If any of your floors are uneven, fix them.  If there are any pets around you, be aware of where they are.  Review your medicines with your doctor. Some medicines can make you feel dizzy. This can increase your chance of falling. Ask your doctor what other things that you can do to help prevent falls. This information is not intended to replace advice given to you by your health care provider. Make sure you discuss any questions you have with your health care provider. Document Released: 03/07/2009 Document Revised: 10/17/2015 Document Reviewed: 06/15/2014 Elsevier Interactive Patient Education  2017 [employer].

## 2020-11-18 ENCOUNTER — Telehealth: Payer: Self-pay | Admitting: Family Medicine

## 2020-11-18 ENCOUNTER — Other Ambulatory Visit: Payer: Self-pay

## 2020-11-18 MED ORDER — LEVOTHYROXINE SODIUM 25 MCG PO TABS: ORAL_TABLET | ORAL | 1 refills | Status: DC

## 2020-11-18 NOTE — Telephone Encounter (Signed)
Pt called in requesting a refill of levothyroxine (SYNTHROID) 25 MCG tablet [[id number]] be sent to pharmacy.  Cb#: [phone]

## 2020-12-02 ENCOUNTER — Ambulatory Visit
Admission: RE | Admit: 2020-12-02 | Discharge: 2020-12-02 | Disposition: A | Discharge status: Home / Self Care | Payer: Medicare HMO | Source: Ambulatory Visit | Attending: Family Medicine | Admitting: Family Medicine

## 2020-12-02 ENCOUNTER — Other Ambulatory Visit: Payer: Self-pay

## 2020-12-02 DIAGNOSIS — Z1231 Encounter for screening mammogram for malignant neoplasm of breast: Secondary | ICD-10-CM

## 2020-12-05 ENCOUNTER — Other Ambulatory Visit: Payer: Self-pay | Admitting: Family Medicine

## 2020-12-11 DIAGNOSIS — R69 Illness, unspecified: Secondary | ICD-10-CM | POA: Diagnosis not present

## 2021-01-13 ENCOUNTER — Other Ambulatory Visit: Payer: Medicare HMO

## 2021-01-13 ENCOUNTER — Other Ambulatory Visit: Payer: Self-pay

## 2021-01-13 DIAGNOSIS — M810 Age-related osteoporosis without current pathological fracture: Secondary | ICD-10-CM

## 2021-01-13 DIAGNOSIS — E039 Hypothyroidism, unspecified: Secondary | ICD-10-CM

## 2021-01-13 DIAGNOSIS — E7800 Pure hypercholesterolemia, unspecified: Secondary | ICD-10-CM

## 2021-01-13 LAB — COMPLETE METABOLIC PANEL WITH GFR
AG Ratio: 1.7 (calc) (ref 1.0–2.5)
ALT: 15 U/L (ref 6–29)
AST: 16 U/L (ref 10–35)
Albumin: 4.3 g/dL (ref 3.6–5.1)
Alkaline phosphatase (APISO): 66 U/L (ref 37–153)
BUN/Creatinine Ratio: 13 (calc) (ref 6–22)
BUN: 15 mg/dL (ref 7–25)
CO2: 26 mmol/L (ref 20–32)
Calcium: 9.5 mg/dL (ref 8.6–10.4)
Chloride: 104 mmol/L (ref 98–110)
Creat: 1.14 mg/dL — ABNORMAL HIGH (ref 0.60–1.00)
Globulin: 2.5 g/dL (calc) (ref 1.9–3.7)
Glucose, Bld: 97 mg/dL (ref 65–99)
Potassium: 4.3 mmol/L (ref 3.5–5.3)
Sodium: 140 mmol/L (ref 135–146)
Total Bilirubin: 0.4 mg/dL (ref 0.2–1.2)
Total Protein: 6.8 g/dL (ref 6.1–8.1)
eGFR: 50 mL/min/{1.73_m2} — ABNORMAL LOW (ref >=60)

## 2021-01-13 LAB — CBC WITH DIFFERENTIAL/PLATELET
Absolute Monocytes: 634 cells/uL (ref 200–950)
Basophils Absolute: 31 cells/uL (ref 0–200)
Basophils Relative: 0.3 %
Eosinophils Absolute: 94 cells/uL (ref 15–500)
Eosinophils Relative: 0.9 %
HCT: 44.4 % (ref 35.0–45.0)
Hemoglobin: 14.7 g/dL (ref 11.7–15.5)
Lymphs Abs: 1529 cells/uL (ref 850–3900)
MCH: 31.9 pg (ref 27.0–33.0)
MCHC: 33.1 g/dL (ref 32.0–36.0)
MCV: 96.3 fL (ref 80.0–100.0)
MPV: 10.7 fL (ref 7.5–12.5)
Monocytes Relative: 6.1 %
Neutro Abs: 8112 cells/uL — ABNORMAL HIGH (ref 1500–7800)
Neutrophils Relative %: 78 %
Platelets: 257 10*3/uL (ref 140–400)
RBC: 4.61 10*6/uL (ref 3.80–5.10)
RDW: 12.5 % (ref 11.0–15.0)
Total Lymphocyte: 14.7 %
WBC: 10.4 10*3/uL (ref 3.8–10.8)

## 2021-01-13 LAB — LIPID PANEL
Cholesterol: 146 mg/dL (ref <200)
HDL: 61 mg/dL (ref >=50)
LDL Cholesterol (Calc): 59 mg/dL (calc)
Non-HDL Cholesterol (Calc): 85 mg/dL (calc) (ref <130)
Total CHOL/HDL Ratio: 2.4 (calc) (ref <5.0)
Triglycerides: 179 mg/dL — ABNORMAL HIGH (ref <150)

## 2021-01-13 LAB — TSH: TSH: 4.18 mIU/L (ref 0.40–4.50)

## 2021-01-14 ENCOUNTER — Encounter: Payer: Self-pay | Admitting: Family Medicine

## 2021-01-14 ENCOUNTER — Ambulatory Visit (INDEPENDENT_AMBULATORY_CARE_PROVIDER_SITE_OTHER): Payer: Medicare HMO | Admitting: Family Medicine

## 2021-01-14 VITALS — BP 118/70 | HR 83 | Temp 98.3°F | Ht 62.0 in | Wt 151.2 lb

## 2021-01-14 DIAGNOSIS — Z9889 Other specified postprocedural states: Secondary | ICD-10-CM | POA: Diagnosis not present

## 2021-01-14 DIAGNOSIS — E7800 Pure hypercholesterolemia, unspecified: Secondary | ICD-10-CM | POA: Diagnosis not present

## 2021-01-14 DIAGNOSIS — E039 Hypothyroidism, unspecified: Secondary | ICD-10-CM

## 2021-01-14 DIAGNOSIS — Z0000 Encounter for general adult medical examination without abnormal findings: Secondary | ICD-10-CM

## 2021-01-14 DIAGNOSIS — I340 Nonrheumatic mitral (valve) insufficiency: Secondary | ICD-10-CM | POA: Diagnosis not present

## 2021-01-14 DIAGNOSIS — M810 Age-related osteoporosis without current pathological fracture: Secondary | ICD-10-CM

## 2021-01-14 DIAGNOSIS — Z0001 Encounter for general adult medical examination with abnormal findings: Secondary | ICD-10-CM

## 2021-01-14 NOTE — Progress Notes (Signed)
Subjective:    Patient ID: Maria Gomez, female    DOB: Mar 06, 1946, 75 y.o.   MRN: [id number]  Medication Refill   03/2018 Patient had heart surgery approximately 1 month ago to repair mitral valve.  She is done extremely well postoperatively and has been started on Coumadin.  Unfortunately, she is developed a chronic nonproductive cough that she describes as an itch or a tingle in the back of her throat.  It tends to be worse during the day when she is up walking around while she is trying to talk to people or breathing cool air.  He denies any hemoptysis.  She denies any chest pain.  She denies any shortness of breath.  She denies any fevers or chills.  She denies any recent travel or exposures.  She tried Zyrtec on her own as well as Tessalon without any relief.  She denies any heartburn.  At that time, my plan was: Patient's exam is unremarkable.  She is therapeutic on Coumadin and therefore I believe it pulmonary embolism after surgery is unlikely.  Differential diagnosis includes upper airway cough syndrome, chronic bronchitis, laryngo-esophageal reflux, nerve irritation in the throat secondary to intubation causing a chronic cough, infection although unlikely.  Recommended a chest x-ray.  Start the patient on a combination of Flonase 2 sprays each nostril daily for possible postnasal drip coupled with Protonix 40 mg daily for possible silent reflux.  Obtain chest x-ray to rule out infection.  Start tramadol for nerve irritation 50 mg p.o. every 8 hours.  Recheck in 1 week.  If cough persist consider additional work-up or empiric trial of inhaled steroids for possible cough variant asthma  06/20/18 Patient has a history of carotid bruits.  She had carotid ultrasounds performed in October of last year.  The results are dictated below:  Right Carotid: Velocities in the right ICA are consistent with a 1-39% stenosis.   Left Carotid: Velocities in the left ICA are consistent with a 1-39%  stenosis. Vertebrals: Left vertebral artery demonstrates antegrade flow. Right vertebral             artery demonstrates high resistant flow.  She is on aspirin 81 mg a day but not a statin.  Patient has discontinued Coumadin and is now only on aspirin.  On a CT scan obtained by her cardiothoracic surgeon last fall, there was a coincidental finding of a 2.9 cm heterogeneous left thyroid nodule.  They recommended that she follow-up with me to discuss this.  This nodule is not palpable today on exam.  However it certainly meets size criteria for a biopsy.  She also reports a one-week history of dysuria, urgency, frequency.  Urinalysis is significant for blood as well as leukocyte esterase.  She denies any low back pain or fevers or chills or nausea or vomiting.  However at the end of the encounter, her visit took an unusual turn.  She states that her son has not spoken to her since Christmas.  She states that her son says she needs counseling.  When I questioned the patient as to why, she states that it is because she told her son the truth.  When I inquired as to what she was referencing, she states that she told her son how her daughter-in-law's brother is sabotaging her life.  This person works for the [employer] according to the patient.  According to the patient he has hacked into the all the electrical devices in her home.  He has messed  up her TV so that it will not work.  He has messed up her dehumidifier in her basement.  He is tapped her phones.  He is sabotaging her friend's cell phones and making them not work.  He is calling her at odd times and having her called by random telemarketer's.  Because of this individual she is receiving lots of spam mail.  When I questioned the patient as to why he would go to see she links to terrorized her and make her life miserable, she states is because she gave her family herpes.  She lists numerous electronic devices such as the TV the telephone in the dehumidifier the  computers the iPad is the cell phones that he has "hacked into and sabotaged".  When she told her son the truth about what was going on, her daughter-in-law became defensive and they have cut off all communication with her until she gets help.  She denies any suicidal ideation or homicidal ideation.  At that time, my plan was: Patient is due for lab work.  I have recommended that she return fasting for a CBC, CMP, fasting lipid panel, and a TSH.  Her goal LDL cholesterol is less than 70 given her carotid stenosis.  She is taking aspirin.  I want to ensure that her levothyroxine dose is appropriate.  I will treat her urinary tract infection with Keflex 500 mg p.o. 3 times daily for 1 week.  I will schedule the patient for a thyroid ultrasound to confirm the presence of the thyroid nodule.  If present and greater than 1 cm it will need a fine-needle aspiration to rule out thyroid malignancy.  However I spent more than 35 minutes with the patient today discussing her paranoid delusions about her extended family "sabotaging all the electrical devices in her home".  The story seems farfetched.  The patient states that she is not crazy.  She has insight into how while the story sounds however she is convinced that he is doing all these things in an effort to terrorized her and punish her.  This is definitely a fixed false belief and qualifies as delusional.  Therefore I have recommended psychiatry consultation.  I will arrange this for the patient.  12/06/18 Thyroid biopsy revealed atypical cells.  Confirmatory Afirma testing revealed a likely benign nodule with less than 4% chance for malignancy.  Patient is here today for a complete physical exam.  We discussed the biopsy results.  We discussed a referral to surgery for excisional biopsy versus clinical monitoring with repeat ultrasound in 1 year.  Patient elects to repeat the ultrasound in 1 year.  She is already scheduled her mammogram.  Due to her age she does not  require a Pap smear.  Her last colonoscopy recommended a repeat colonoscopy in 5 years.  This was performed in 2015.  Despite the colonoscopy being clear she has a family history of a brother who died from colon cancer at 15.  Therefore she is due this year.  She declines allowing me to schedule this today.  I question the patient about the situation in her family.  She states that her relationship with her son and her daughter-in-law have improved.  She still reports of paranoia regarding her daughter-in-law's brother.  She still states that he is tapping into her phones.  She denies any depression or anxiety or hallucinations although this appears to be a fixed delusion.  She declines referral to psychiatry at the present time.  Her  blood pressure today is elevated 138/98.  At that time, my plan was: Physical exam today is normal aside from the patient's delusions regarding her daughter-in-law's brother.  She declines referral to a counselor or therapist at the present time.  Blood pressure is elevated.  I reviewed her most recent lab work with her which does show a decline in her kidney function.  I would recommend repeating this in 6 months.  Otherwise her thyroid test is outstanding.  The biopsy of the nodule but came back reassuring.  We will repeat an ultrasound of this in 1 year to monitor it.  She is due for her next bone density next year.  She is already schedule her mammogram.  I recommended a colonoscopy but she declines this at the present time.  Patient is due for Pneumovax 23 which she received today.  Otherwise Prevnar 13 and her shingles vaccine are up-to-date.  She declines any problems with falls or memory loss.  She denies any depression at the present time.   04/24/19 Patient presents today with her son.  First she reports fecal leakage.  She states that stool is coming out uncontrollably almost like toothpaste.  I saw the patient 1 month ago and at that time she was complaining of not  going to the bathroom to defecate for several days in a row.  At that time she was experiencing hard balls of stool.  I recommended trying Amitiza however she stopped that medication due to diarrhea after 1 week.  She states that ever since stool has been leaking out again with a consistency of toothpaste.  She is unable to control it.  She is also overdue to check her thyroid/TSH.  She would like to do that today while she is here.  The patient continues to endorse delusions.  She believes that people are tapping her telephones.  She believes that extended members of her family are sabotaging her telephone and her iPad.  She believes that my office as well as the gastroenterologist office are refusing to see her due to her HPV infection which is not the case.  No amount of reassurance can convince her otherwise.  She is also dealing with anxiety and stress related to these concerns and fears and delusional thoughts.  I have recommended psychiatry but the patient refused that in the past.  However today with both the insistence of myself and her son, the patient is willing to discuss the situation with a psychiatrist.  I believe the patient may have schizoaffective disorder.  Therefore I would strongly recommend getting a second opinion with a psychiatrist as I explained to the patient.  I believe this is better managed by a psychiatrist and a primary care physician.  At that time, my plan was: We will recheck TSH today to ensure adequate dosage of levothyroxine.  Strongly recommended the covid vaccine when available.  Recommended psychiatry consult for delusional thoughts and possible schizoaffective disorder.  I am concerned that the patient may have overflow incontinence due to constipation. Obtain xray to rule out constipation as a cause of possible "encoparesis".  If not, consider bulking agents or even cholestyramine.  Spent 35 minutes with patient today. Consult GI for colon can screening as well.    12/21/19 I asked the patient to come in today for an evaluation as I had not seen her in quite some time.  She is seeing a psychiatrist and they are prescribing risperdal.  She states that the situation with her  son has improved dramatically.  She is now able to see her grandchildren.  In fact they have a 83-year-old birthday party that they are planning to attend later today.  She denies any delusional behaviors or depression.  Psychiatrist recently check lab work including a CBC, CMP, fasting lipid panel.  Patient has stable stage III chronic kidney disease with a GFR around 51 mL of blood per minute and a creatinine of 1.07.  Liver function tests are normal.  Cholesterol is outstanding with a total cholesterol around 130, and LDL cholesterol well below 100.  She is tolerating the Lipitor well without any problems.  She is overdue to check a TSH to monitor the management of her hypothyroidism.  She also has a history of osteoporosis.  She recently had a bone density test.  Bone density test showed a T score of -2.7 in the spine which was unchanged from 2019 and a T score of -2.0 in the left hip which was unchanged from 2019.  She states that she has been on Fosamax for more than 5 years.  At that time, my plan was:  Check a TSH to ensure adequate dosage of her levothyroxine.  Patient is due for a break from her bisphosphonate as she has been on Fosamax for more than 5 years per her report.  Therefore we will temporarily discontinue Fosamax.  Strongly encouraged her to take 1200 mg a day of calcium and 1000 units a day of vitamin D I would recommend rechecking a bone density test in 2 years and if her T score starts to decline resume a bisphosphonate.  I do believe the patient can discontinue her aspirin.  Recent fasting lipid panel was outstanding.  Make no changes in her cholesterol medication.  Chronic kidney disease is stable.  Recommended she avoid NSAIDs and drink plenty of water.  Blood pressure today  is well controlled at 110/80.  01/14/21 Patient is here today for complete physical exam.  She denies any concerns.  Her mammogram was performed in July and was normal.  She states that she had a colonoscopy 2 years ago.  Due to the history of polyps they recommended a repeat colonoscopy in 5 years however she is still up-to-date.  Due to her age she does not require a hysterectomy.  She had a bone density test last year.  Due to a history of Fosamax for more than 5 years we discontinued Fosamax.  She is currently taking calcium and vitamin D.  She is due to repeat a bone density test next year.  She denies any falls.  She denies any memory loss.  She states that the situation with her son is still doing well.  She denies any further delusions or hallucinations or depression.  However she states that she is no longer going to see her psychiatrist.  She states that she is doing so well that they stated there was no reason for her to come back.  She still on the Risperdal however she states that when she "finishes it" she is going to stop it.  This seems odd to me.  I cannot imagine that they would want her not to follow-up after having stopped the medication.  Furthermore, I question whether this is something that is cured rather than managed. Past Medical History:  Diagnosis Date   Hypothyroidism    Mitral regurgitation    Osteopenia    S/P minimally invasive mitral valve repair 03/10/2018   Complex valvuloplasty including triangular resection  of posterior leaflet, artificial Gore-tex neochord x6 and Size 30 Sorin Memo 4D Ring SN [id number] via right mini thoracotomy approach   Thyroid nodule 03/07/2018   Incidental - noted on CT scan, atypical cells found on fine-needle aspiration.  General surgery consultation and excision recommended February 2020   Past Surgical History:  Procedure Laterality Date   BREAST EXCISIONAL BIOPSY Bilateral    MITRAL VALVE REPAIR Right 03/10/2018   Procedure: MINIMALLY  INVASIVE MITRAL VALVE REPAIR (MVR):  -Triangular Resection of Posterior Leaflet P2; -Shortening Plasty of Posterior Leaflet P2; -Placement of Goretex Neo Chords x 6 using Chord-X system; -Ring Annuloplasty with a 30 mm Sorin Memo 4D Ring;  Surgeon: Rexene Alberts, MD;  Location: [place];  Service: Open Heart Surgery;  Laterality: Right;   RIGHT/LEFT HEART CATH AND CORONARY ANGIOGRAPHY N/A 01/18/2018   Procedure: RIGHT/LEFT HEART CATH AND CORONARY ANGIOGRAPHY;  Surgeon: Martinique, Peter M, MD;  Location: [place] [facility];  Service: Cardiovascular;  Laterality: N/A;   TEE WITHOUT CARDIOVERSION N/A 11/09/2017   Procedure: TRANSESOPHAGEAL ECHOCARDIOGRAM (TEE);  Surgeon: Larey Dresser, MD;  [facility];  Service: Cardiovascular;  Laterality: N/A;   TEE WITHOUT CARDIOVERSION N/A 03/10/2018   Procedure: TRANSESOPHAGEAL ECHOCARDIOGRAM (TEE);  Surgeon: Rexene Alberts, MD;  Location: [place];  Service: Open Heart Surgery;  Laterality: N/A;   Current Outpatient Medications on File Prior to Visit  Medication Sig Dispense Refill   atorvastatin (LIPITOR) 20 MG tablet TAKE 1 TABLET BY MOUTH EVERY DAY 90 tablet 0   Biotin 5000 MCG TABS Take 5,000 mcg by mouth daily.      Calcium Carb-Cholecalciferol (CALTRATE 600+D3 PO) Take by mouth.     Coenzyme Q10 (CO Q 10 PO) Take 800 mg by mouth daily.      levothyroxine (SYNTHROID) 25 MCG tablet TAKE 1 TABLET BY MOUTH EVERY DAY BEFORE BREAKFAST 90 tablet 1   Melatonin 5 MG CAPS Take 10 mg by mouth at bedtime.     Multiple Vitamin (MULTIVITAMIN) tablet Take 1 tablet by mouth daily. Centrum Silver     polyethylene glycol (GAVILYTE-G) 236 g solution Take as directed by office instructions     Polyethylene Glycol 400 0.25 % SOLN Apply to eye.     risperiDONE (RISPERDAL) 1 MG tablet Take 1 mg by mouth at bedtime.     vitamin B-12 (CYANOCOBALAMIN) 1000 MCG tablet Take 1,000 mcg by mouth daily.     zinc gluconate 50 MG tablet Take 50 mg by mouth daily.     No  current facility-administered medications on file prior to visit.   Allergies  Allergen Reactions   Niaspan [Niacin Er] Other (See Comments)    Severe flushing   Phenazopyridine Hcl Other (See Comments)    Made sick    Statins     Severe flushing   Social History   Socioeconomic History   Marital status: Widowed    Spouse name: Not on file   Number of children: Not on file   Years of education: Not on file   Highest education level: Not on file  Occupational History   Not on file  Tobacco Use   Smoking status: Never   Smokeless tobacco: Never  Vaping Use   Vaping Use: Never used  Substance and Sexual Activity   Alcohol use: No   Drug use: No   Sexual activity: Not on file  Other Topics Concern   Not on file  Social History Narrative   Lives with her  mom.    Takes care of aunt who lives across the street.    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         Social Determinants of Health   Financial Resource Strain: Not on file  Food Insecurity: No Food Insecurity   Worried About Charity fundraiser in the Last Year: Never true   Ran Out of Food in the Last Year: Never true  Transportation Needs: No Transportation Needs   Lack of Transportation (Medical): No   Lack of Transportation (Non-Medical): No  Physical Activity: Sufficiently Active   Days of Exercise per Week: 4 days   Minutes of Exercise per Session: 40 min  Stress: No Stress Concern Present   Feeling of Stress : Not at all  Social Connections: Moderately Isolated   Frequency of Communication with Friends and Family: More than three times a week   Frequency of Social Gatherings with Friends and Family: More than three times a week   Attends Religious Services: More than 4 times per year   Active Member of [employer] or Organizations: No   Attends Archivist Meetings: Never   Marital Status: Widowed  Human resources officer Violence: Not At Risk   Fear of Current or Ex-Partner:  No   Emotionally Abused: No   Physically Abused: No   Sexually Abused: No      Review of Systems  All other systems reviewed and are negative.     Objective:   Physical Exam Vitals reviewed.  Constitutional:      General: She is not in acute distress.    Appearance: Normal appearance. She is normal weight. She is not ill-appearing, toxic-appearing or diaphoretic.  HENT:     Head: Normocephalic and atraumatic.     Right Ear: Tympanic membrane, ear canal and external ear normal. There is no impacted cerumen.     Left Ear: Tympanic membrane, ear canal and external ear normal. There is no impacted cerumen.     Nose: Nose normal. No congestion or rhinorrhea.     Mouth/Throat:     Pharynx: No oropharyngeal exudate or posterior oropharyngeal erythema.  Eyes:     General: No scleral icterus.       Right eye: No discharge.        Left eye: No discharge.     Extraocular Movements: Extraocular movements intact.     Conjunctiva/sclera: Conjunctivae normal.     Pupils: Pupils are equal, round, and reactive to light.  Neck:     Thyroid: No thyromegaly.     Vascular: No carotid bruit or JVD.     Trachea: No tracheal deviation.  Cardiovascular:     Rate and Rhythm: Normal rate and regular rhythm.     Heart sounds: Murmur heard.    No friction rub. No gallop.  Pulmonary:     Effort: Pulmonary effort is normal. No respiratory distress.     Breath sounds: Normal breath sounds. No stridor. No wheezing, rhonchi or rales.  Chest:     Chest wall: No tenderness.  Abdominal:     General: Bowel sounds are normal. There is no distension.     Palpations: Abdomen is soft. There is no mass.     Tenderness: There is no abdominal tenderness. There is no right CVA tenderness, left CVA tenderness, guarding or rebound.     Hernia: No hernia is present.  Musculoskeletal:        General: No  swelling, tenderness or deformity.     Cervical back: Neck supple. No muscular tenderness.     Right lower leg:  No edema.     Left lower leg: No edema.  Lymphadenopathy:     Cervical: No cervical adenopathy.  Skin:    General: Skin is warm.     Coloration: Skin is not jaundiced or pale.     Findings: No bruising, erythema, lesion or rash.  Neurological:     General: No focal deficit present.     Mental Status: She is alert and oriented to person, place, and time. Mental status is at baseline.     Cranial Nerves: No cranial nerve deficit.     Sensory: No sensory deficit.     Motor: No weakness.     Coordination: Coordination normal.     Gait: Gait normal.     Deep Tendon Reflexes: Reflexes normal.  Psychiatric:        Mood and Affect: Mood normal.        Behavior: Behavior normal.        Thought Content: Thought content normal.        Judgment: Judgment normal.            Assessment & Plan:  Encounter for Medicare annual wellness exam - Plan: CBC with Differential/Platelet, COMPLETE METABOLIC PANEL WITH GFR, Lipid panel, TSH  Osteoporosis without current pathological fracture, unspecified osteoporosis type  Hypothyroidism, unspecified type - Plan: TSH  Pure hypercholesterolemia - Plan: CBC with Differential/Platelet, COMPLETE METABOLIC PANEL WITH GFR, Lipid panel  Non-rheumatic mitral regurgitation  S/P minimally invasive mitral valve repair Lab on 01/13/2021  Component Date Value Ref Range Status   WBC 01/13/2021 10.4  3.8 - 10.8 Thousand/uL Final   RBC 01/13/2021 4.61  3.80 - 5.10 Million/uL Final   Hemoglobin 01/13/2021 14.7  11.7 - 15.5 g/dL Final   HCT 01/13/2021 44.4  35.0 - 45.0 % Final   MCV 01/13/2021 96.3  80.0 - 100.0 fL Final   MCH 01/13/2021 31.9  27.0 - 33.0 pg Final   MCHC 01/13/2021 33.1  32.0 - 36.0 g/dL Final   RDW 01/13/2021 12.5  11.0 - 15.0 % Final   Platelets 01/13/2021 257  140 - 400 Thousand/uL Final   MPV 01/13/2021 10.7  7.5 - 12.5 fL Final   Neutro Abs 01/13/2021 8,112 (A) 1,500 - 7,800 cells/uL Final   Lymphs Abs 01/13/2021 1,529  850 - 3,900  cells/uL Final   Absolute Monocytes 01/13/2021 634  200 - 950 cells/uL Final   Eosinophils Absolute 01/13/2021 94  15 - 500 cells/uL Final   Basophils Absolute 01/13/2021 31  0 - 200 cells/uL Final   Neutrophils Relative % 01/13/2021 78  % Final   Total Lymphocyte 01/13/2021 14.7  % Final   Monocytes Relative 01/13/2021 6.1  % Final   Eosinophils Relative 01/13/2021 0.9  % Final   Basophils Relative 01/13/2021 0.3  % Final   TSH 01/13/2021 4.18  0.40 - 4.50 mIU/L Final   Glucose, Bld 01/13/2021 97  65 - 99 mg/dL Final   Comment: .            Fasting reference interval .    BUN 01/13/2021 15  7 - 25 mg/dL Final   Creat 01/13/2021 1.14 (A) 0.60 - 1.00 mg/dL Final   eGFR 01/13/2021 50 (A) > OR = 60 mL/min/1.6m Final   Comment: The eGFR is based on the CKD-EPI 2021 equation. To calculate  the new eGFR from  a previous Creatinine or Cystatin C result, go to https://www.kidney.org/professionals/ kdoqi/gfr%5Fcalculator    BUN/Creatinine Ratio 01/13/2021 13  6 - 22 (calc) Final   Sodium 01/13/2021 140  135 - 146 mmol/L Final   Potassium 01/13/2021 4.3  3.5 - 5.3 mmol/L Final   Chloride 01/13/2021 104  98 - 110 mmol/L Final   CO2 01/13/2021 26  20 - 32 mmol/L Final   Calcium 01/13/2021 9.5  8.6 - 10.4 mg/dL Final   Total Protein 01/13/2021 6.8  6.1 - 8.1 g/dL Final   Albumin 01/13/2021 4.3  3.6 - 5.1 g/dL Final   Globulin 01/13/2021 2.5  1.9 - 3.7 g/dL (calc) Final   AG Ratio 01/13/2021 1.7  1.0 - 2.5 (calc) Final   Total Bilirubin 01/13/2021 0.4  0.2 - 1.2 mg/dL Final   Alkaline phosphatase (APISO) 01/13/2021 66  37 - 153 U/L Final   AST 01/13/2021 16  10 - 35 U/L Final   ALT 01/13/2021 15  6 - 29 U/L Final   Cholesterol 01/13/2021 146  <200 mg/dL Final   HDL 01/13/2021 61  > OR = 50 mg/dL Final   Triglycerides 01/13/2021 179 (A) <150 mg/dL Final   LDL Cholesterol (Calc) 01/13/2021 59  mg/dL (calc) Final   Comment: Reference range: <100 . Desirable range <100 mg/dL for primary  prevention;   <70 mg/dL for patients with CHD or diabetic patients  with > or = 2 CHD risk factors. Marland Kitchen LDL-C is now calculated using the Martin-Hopkins  calculation, which is a validated novel method providing  better accuracy than the Friedewald equation in the  estimation of LDL-C.  Cresenciano Genre et al. Annamaria Helling. 6063;016(01): 2061-2068  (http://education.[facility].com/faq/FAQ164)    Total CHOL/HDL Ratio 01/13/2021 2.4  <5.0 (calc) Final   Non-HDL Cholesterol (Calc) 01/13/2021 85  <130 mg/dL (calc) Final   Comment: For patients with diabetes plus 1 major ASCVD risk  factor, treating to a non-HDL-C goal of <100 mg/dL  (LDL-C of <70 mg/dL) is considered a therapeutic  option.    Aside from stable chronic kidney disease, her lab work is outstanding.  Mammogram is up-to-date.  Colonoscopy is up-to-date.  Pap smear is not due.  Bone density test is due next year.  Continue calcium and vitamin D.  Physically the patient is doing outstanding.  I do not appreciate any thyroid nodules on exam.  However, I have strongly recommended to the patient that she continues to follow-up with her psychiatrist.  Patient truly may be doing well but I would like her to continue to see them to ensure that there is no setback after she discontinues Risperdal.  I also want to make sure that that was the true intention.  We have not received any records from her psychiatrist.  Patient states that she will be glad to follow-up with a psychiatrist.

## 2021-02-27 ENCOUNTER — Other Ambulatory Visit: Payer: Self-pay | Admitting: Family Medicine

## 2021-03-03 IMAGING — MG DIGITAL SCREENING BILAT W/ TOMO W/ CAD
8 series · 8 of 24 positions shown · non-contrast
Comparison: Previous exam(s).

CLINICAL DATA: Screening.

EXAM:
DIGITAL SCREENING BILATERAL MAMMOGRAM WITH TOMO AND CAD

[L MLO synth-2D]
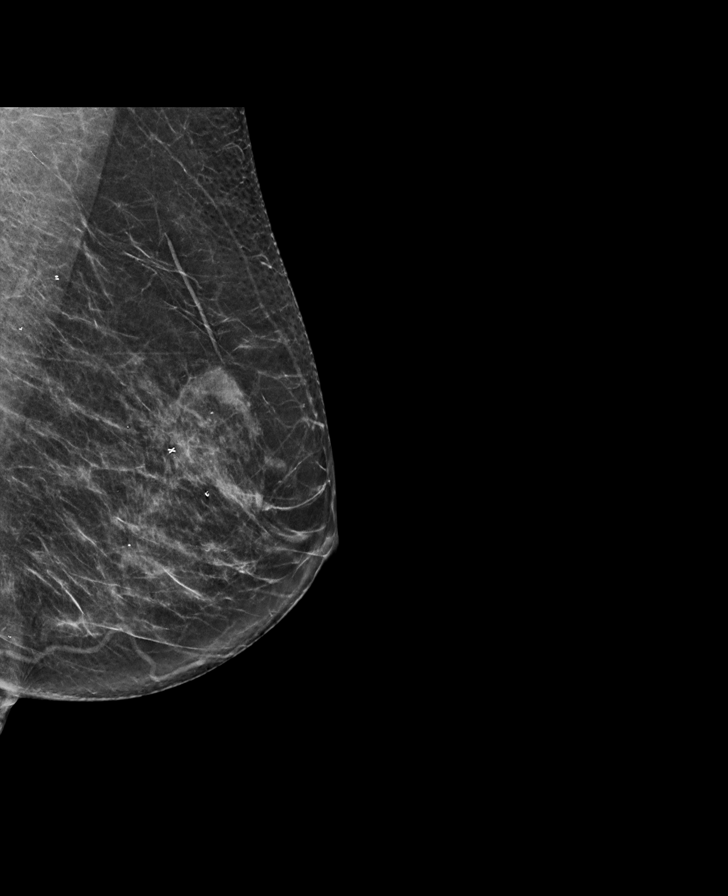

[R MLO synth-2D]
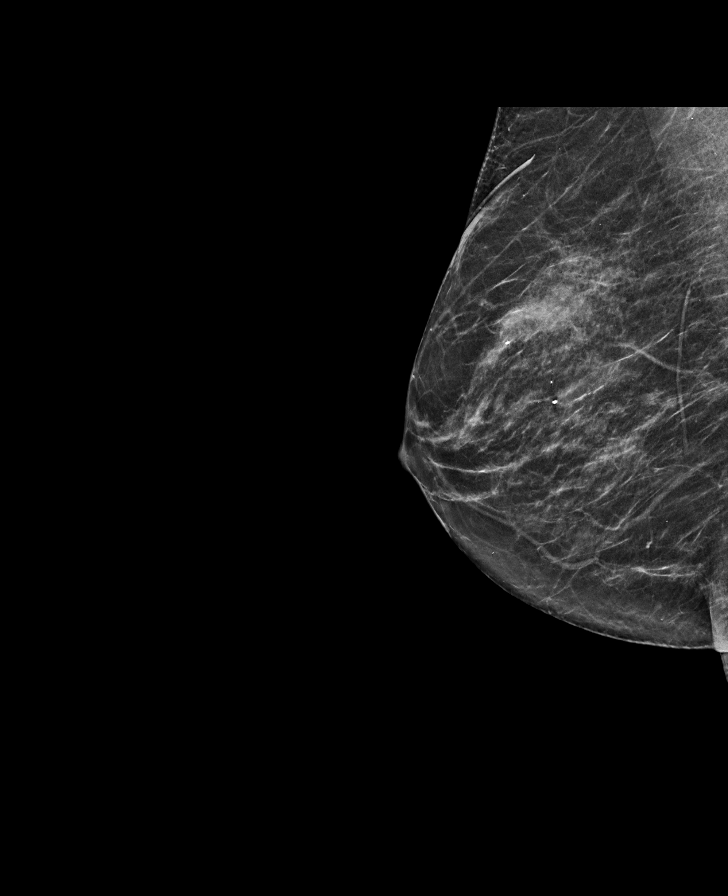

[R CC synth-2D]
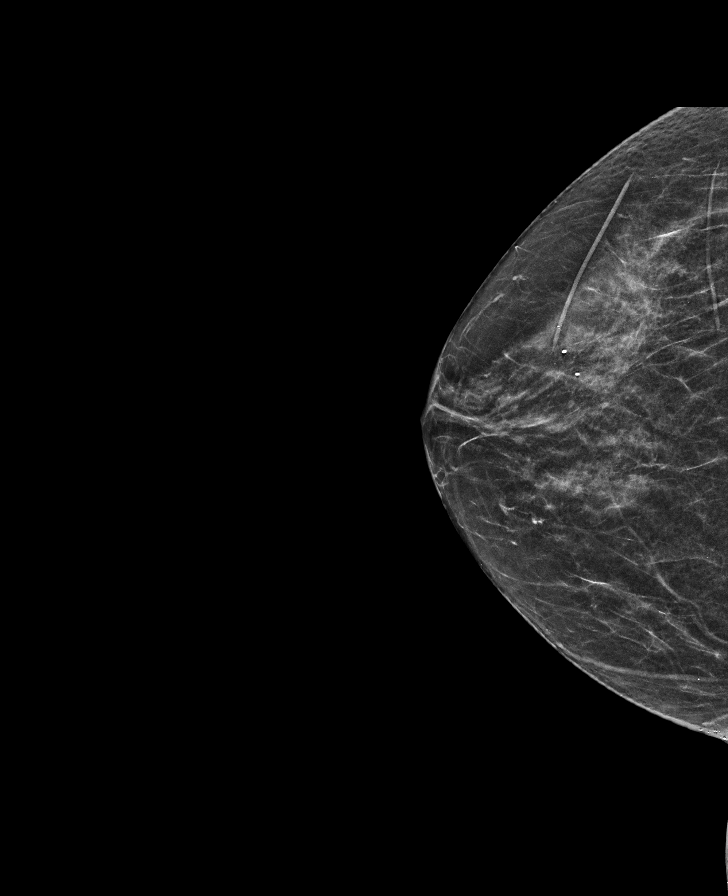

[L CC synth-2D]
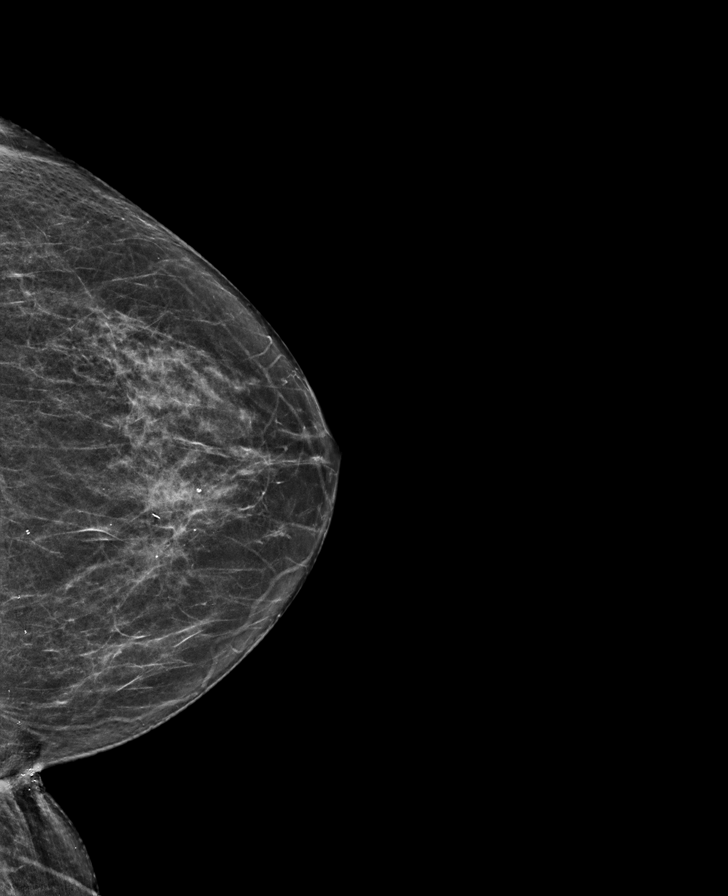

[L MLO tomo · tomo slice 31/62.0]
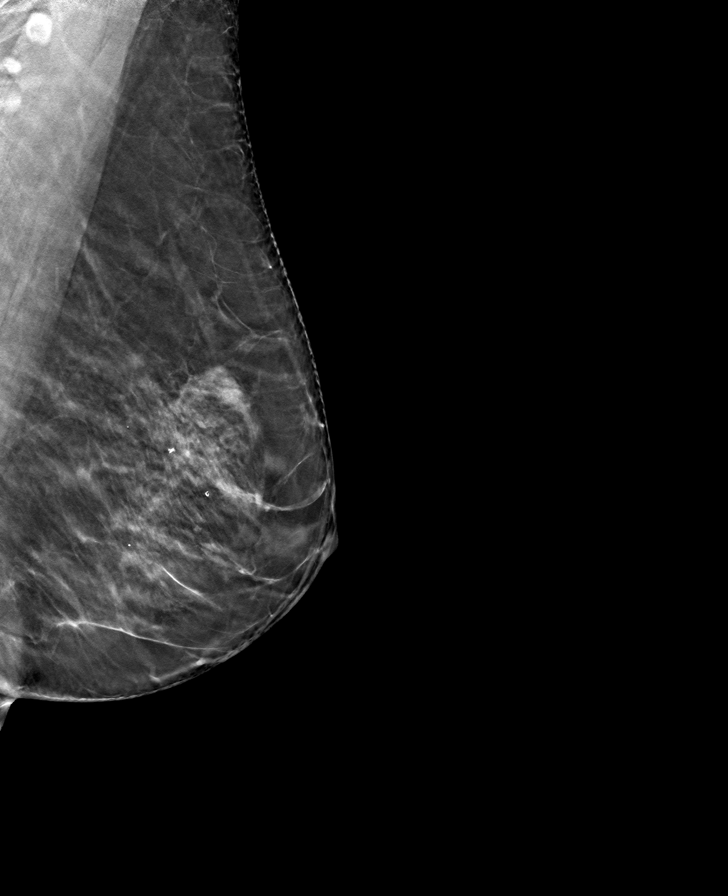

[R CC tomo · tomo slice 27/52.0]
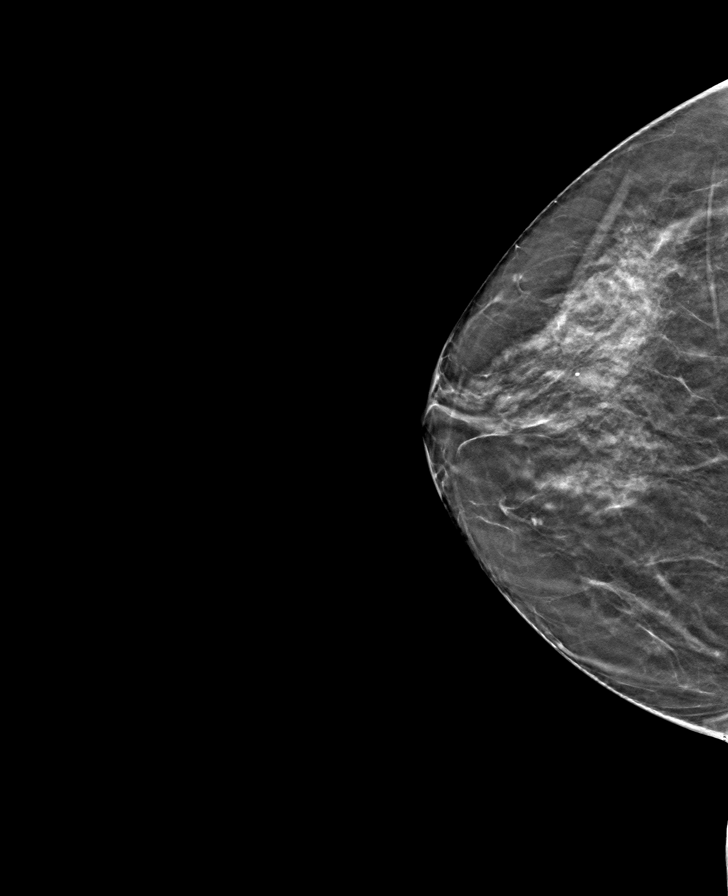

[R MLO tomo · tomo slice 28/55.0]
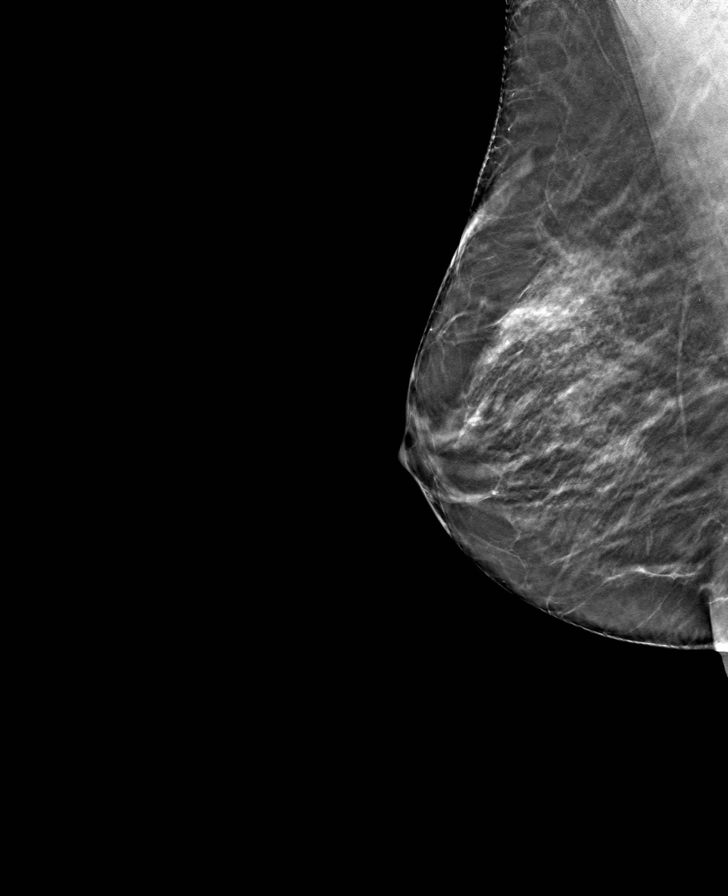

[L CC tomo · tomo slice 31/60.0]
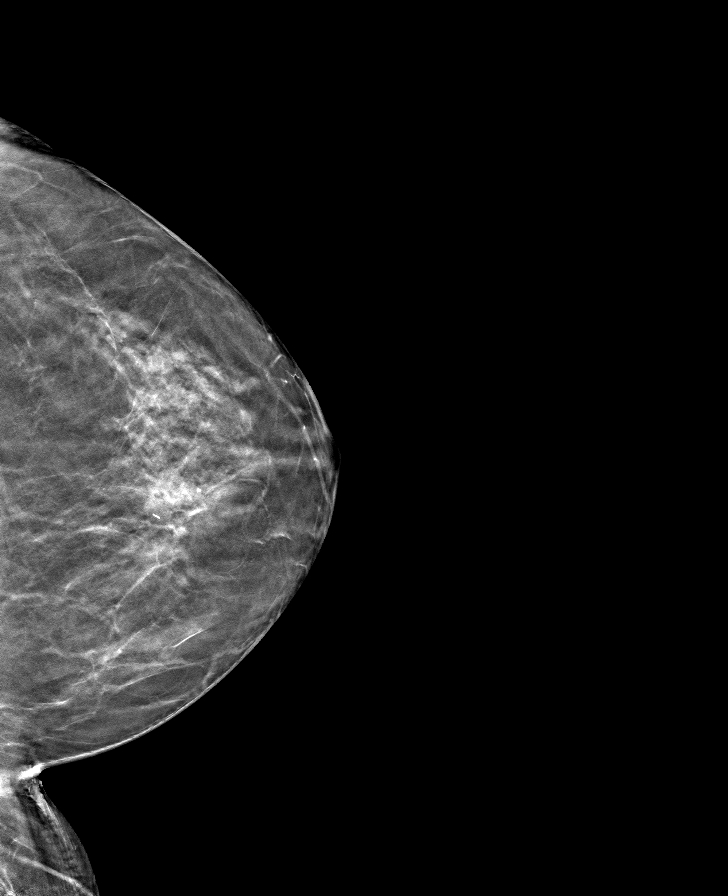

[8 of 24 positions shown; findings below may reference images not displayed]

ACR Breast Density Category b: There are scattered areas of
fibroglandular density.
FINDINGS: There are no findings suspicious for malignancy. Images were
processed with CAD.
IMPRESSION: No mammographic evidence of malignancy. A result letter of this
screening mammogram will be mailed directly to the patient.

RECOMMENDATION:
Screening mammogram in one year. (Code:[id number])

BI-RADS CATEGORY  1: Negative.

## 2021-05-20 ENCOUNTER — Other Ambulatory Visit: Payer: Self-pay | Admitting: Family Medicine

## 2021-06-18 ENCOUNTER — Other Ambulatory Visit: Payer: Self-pay

## 2021-06-18 MED ORDER — ATORVASTATIN CALCIUM 20 MG PO TABS: 20.0000 mg | ORAL_TABLET | Freq: Every day | ORAL | 3 refills | Status: DC

## 2021-06-29 NOTE — Progress Notes (Signed)
Cardiology Office Note    Date:  07/03/2021   ID:  Momo, Braun January 27, 1946, MRN [id number]  PCP:  Susy Frizzle, MD  Cardiologist:  Dr. Martinique  Chief Complaint  Patient presents with   Follow-up    1 year.   Mitral Valve Prolapse    History of Present Illness:  Maria Gomez is a 76 y.o. female seen for followup Mitral insufficiency. She was intially evaluated on 11/03/17 for evaluation of a heart murmur. She has a  PMH of hypothyroidism and HLD but no prior cardiac history.  On the previous office visit with his primary care provider in March 2018, a heart murmur was heard.  Echocardiogram obtained on 09/01/2016 showed EF 60 to 65%, mild LVH with moderate focal basal septal hypertrophy, grade 1 DD, thickened mitral leaflet with posterior leaflet prolapse, mild MR, moderate LAE, PA peak pressure was 21 mmHg.  Carotid Doppler obtained on 09/04/2016 showed no significant disease bilaterally.  Echocardiogram was repeated on 09/20/2017, this showed EF 55 to 60%, bileaflet prolapse of mitral valve with at least moderate to severe MR directed anteriorly into the left atrium, PA peak pressure 35 mmHg. She subsequently underwent TEE showing bileaflet MVP with a flail P2 segment and severe MR. She subsequently underwent [facility] on 01/18/18 confirming severe MR. Normal coronary arteries. Normal right heart pressures.   She had minimally invasive mitral valve repair on 03/10/2018 by Dr. Roxy Manns. She did very well with this and repeat Echo showed resolution of her MR.   On follow up she denies any chest pain, dyspnea, edema, palpitations. She feels very well. She walks 2 miles a day.   Past Medical History:  Diagnosis Date   Hypothyroidism    Mitral regurgitation    Osteopenia    S/P minimally invasive mitral valve repair 03/10/2018   Complex valvuloplasty including triangular resection of posterior leaflet, artificial Gore-tex neochord x6 and Size 30 Sorin Memo 4D Ring SN [id number]  via right mini thoracotomy approach   Thyroid nodule 03/07/2018   Incidental - noted on CT scan, atypical cells found on fine-needle aspiration.  General surgery consultation and excision recommended February 2020    Past Surgical History:  Procedure Laterality Date   BREAST EXCISIONAL BIOPSY Bilateral    MITRAL VALVE REPAIR Right 03/10/2018   Procedure: MINIMALLY INVASIVE MITRAL VALVE REPAIR (MVR):  -Triangular Resection of Posterior Leaflet P2; -Shortening Plasty of Posterior Leaflet P2; -Placement of Goretex Neo Chords x 6 using Chord-X system; -Ring Annuloplasty with a 30 mm Sorin Memo 4D Ring;  Surgeon: Rexene Alberts, MD;  Location: [place];  Service: Open Heart Surgery;  Laterality: Right;   RIGHT/LEFT HEART CATH AND CORONARY ANGIOGRAPHY N/A 01/18/2018   Procedure: RIGHT/LEFT HEART CATH AND CORONARY ANGIOGRAPHY;  Surgeon: Martinique, Montavis Schubring M, MD;  Location: [place] [facility];  Service: Cardiovascular;  Laterality: N/A;   TEE WITHOUT CARDIOVERSION N/A 11/09/2017   Procedure: TRANSESOPHAGEAL ECHOCARDIOGRAM (TEE);  Surgeon: Larey Dresser, MD;  Location: [facility] ENDOSCOPY;  Service: Cardiovascular;  Laterality: N/A;   TEE WITHOUT CARDIOVERSION N/A 03/10/2018   Procedure: TRANSESOPHAGEAL ECHOCARDIOGRAM (TEE);  Surgeon: Rexene Alberts, MD;  Location: [place];  Service: Open Heart Surgery;  Laterality: N/A;    Current Medications: Outpatient Medications Prior to Visit  Medication Sig Dispense Refill   atorvastatin (LIPITOR) 20 MG tablet Take 1 tablet (20 mg total) by mouth daily. 90 tablet 3   Biotin 5000 MCG TABS Take 5,000 mcg by mouth daily.  Calcium Carbonate Antacid (CALCIUM CARBONATE PO) Take 1 capsule by mouth in the morning and at bedtime.     Coenzyme Q10 (CO Q 10 PO) Take 800 mg by mouth daily.      levothyroxine (SYNTHROID) 25 MCG tablet TAKE 1 TABLET BY MOUTH EVERY DAY BEFORE BREAKFAST 90 tablet 1   Melatonin 5 MG CAPS Take 10 mg by mouth at bedtime.     Multiple Vitamin  (MULTIVITAMIN) tablet Take 1 tablet by mouth daily. Centrum Silver     polyethylene glycol (GAVILYTE-G) 236 g solution Take as directed by office instructions     Polyethylene Glycol 400 0.25 % SOLN Apply to eye.     vitamin B-12 (CYANOCOBALAMIN) 1000 MCG tablet Take 1,000 mcg by mouth daily.     zinc gluconate 50 MG tablet Take 50 mg by mouth daily.     Calcium Carb-Cholecalciferol (CALTRATE 600+D3 PO) Take by mouth.     risperiDONE (RISPERDAL) 1 MG tablet Take 1 mg by mouth at bedtime.     No facility-administered medications prior to visit.     Allergies:   Niaspan [niacin er], Phenazopyridine hcl, and Statins   Social History   Socioeconomic History   Marital status: Widowed    Spouse name: Not on file   Number of children: Not on file   Years of education: Not on file   Highest education level: Not on file  Occupational History   Not on file  Tobacco Use   Smoking status: Never   Smokeless tobacco: Never  Vaping Use   Vaping Use: Never used  Substance and Sexual Activity   Alcohol use: No   Drug use: No   Sexual activity: Not on file  Other Topics Concern   Not on file  Social History Narrative   Lives with her mom.    Takes care of aunt who lives across the street.    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         Social Determinants of Health   Financial Resource Strain: Not on file  Food Insecurity: No Food Insecurity   Worried About Charity fundraiser in the Last Year: Never true   Ran Out of Food in the Last Year: Never true  Transportation Needs: No Transportation Needs   Lack of Transportation (Medical): No   Lack of Transportation (Non-Medical): No  Physical Activity: Sufficiently Active   Days of Exercise per Week: 4 days   Minutes of Exercise per Session: 40 min  Stress: No Stress Concern Present   Feeling of Stress : Not at all  Social Connections: Moderately Isolated   Frequency of Communication with Friends and  Family: More than three times a week   Frequency of Social Gatherings with Friends and Family: More than three times a week   Attends Religious Services: More than 4 times per year   Active Member of [employer] or Organizations: No   Attends Archivist Meetings: Never   Marital Status: Widowed     Family History:  The patient's family history includes Cancer (age of onset: 55) in her brother; Cancer (age of onset: 71) in her maternal grandmother; Heart disease in her father; Stroke in her paternal grandfather; Stroke (age of onset: 60) in her maternal grandfather.   ROS:   Please see the history of present illness.    ROS All other systems reviewed and are negative.   PHYSICAL EXAM:   VS:  BP 102/74 (BP Location: Left Arm, Patient Position: Sitting, Cuff Size: Normal)    Pulse 77    Ht 5\' 2"  (1.575 m)    Wt 153 lb (69.4 kg)    BMI 27.98 kg/m    GENERAL:  Well appearing overweight WF in NAD HEENT:  PERRL, EOMI, sclera are clear. Oropharynx is clear. NECK:  No jugular venous distention, carotid upstroke brisk and symmetric, no bruits, no thyromegaly or adenopathy LUNGS:  Clear to auscultation bilaterally CHEST:  Unremarkable HEART:  RRR,  PMI not displaced or sustained,S1 and S2 within normal limits, no S3, no S4: no clicks, no rubs, no murmur ABD:  Soft, nontender. BS +, no masses or bruits. No hepatomegaly, no splenomegaly EXT:  2 + pulses throughout, no edema, no cyanosis no clubbing. Radial cath site has healed well. SKIN:  Warm and dry.  No rashes NEURO:  Alert and oriented x 3. Cranial nerves II through XII intact. PSYCH:  Cognitively intact    Wt Readings from Last 3 Encounters:  07/03/21 153 lb (69.4 kg)  01/14/21 151 lb 3.2 oz (68.6 kg)  10/24/20 153 lb (69.4 kg)      Studies/Labs Reviewed:   EKG:  EKG is  ordered today.  NSR with LAD.  Otherwise normal. Rate 77. I have personally reviewed and interpreted this study.   Recent Labs: 01/13/2021: ALT 15; BUN 15;  Creat 1.14; Hemoglobin 14.7; Platelets 257; Potassium 4.3; Sodium 140; TSH 4.18   Lipid Panel    Component Value Date/Time   CHOL 146 01/13/2021 1127   TRIG 179 (H) 01/13/2021 1127   HDL 61 01/13/2021 1127   CHOLHDL 2.4 01/13/2021 1127   VLDL 31 (H) 08/11/2016 0857   LDLCALC 59 01/13/2021 1127    Additional studies/ records that were reviewed today include:   Echo 09/01/2016 LV EF: 60% -   65% Study Conclusions   - Left ventricle: The cavity size was normal. Wall thickness was   increased in a pattern of mild LVH. There was moderate focal   basal hypertrophy of the septum. Systolic function was normal.   The estimated ejection fraction was in the range of 60% to 65%.   Wall motion was normal; there were no regional wall motion   abnormalities. Doppler parameters are consistent with abnormal   left ventricular relaxation (grade 1 diastolic dysfunction). The   E/e&' ratio is >15, suggesting elevated LV filling pressure. - Aortic valve: Trileaflet. Sclerosis without stenosis. There was   no regurgitation. - Mitral valve: Thickened leaflets with posterior leaflet prolapse.   Trace to mild regurgitation. - Left atrium: The atrium was moderately dilated. - Tricuspid valve: There was trivial regurgitation. - Pulmonary arteries: PA peak pressure: 21 mm Hg (S). - Inferior vena cava: The vessel was normal in size. The   respirophasic diameter changes were in the normal range (>= 50%),   consistent with normal central venous pressure.   Impressions:   - LVEF 60-65%, mild LVH with moderate focal basal septal   hypertrophy, normal wall motion, grade 1 DD with elevated LV   filling pressure, aortic valve sclerosis, posterior mitral valve   leaflet prolapse with mild to moderate anteriorly directed mitral   regurgitation, moderate LAE, trivial TR, RVSP 21 mmHg, normal   IVC.   Echo 09/20/2017 LV EF: 55% -   60% Study Conclusions   - Left ventricle: The cavity size was normal. Wall  thickness was   increased in a pattern of mild LVH. Systolic function  was normal.   The estimated ejection fraction was in the range of 55% to 60%. - Mitral valve: There is bileaflet prolapse of the MV MR is   directed anteirorly into the left atrium IT is at least   moderately severe . - Left atrium: The atrium was mildly dilated. - Pulmonary arteries: PA peak pressure: 35 mm Hg (S).  TEE 11/09/17: Study Conclusions   - Left ventricle: The cavity size was normal. Wall thickness was   normal. The estimated ejection fraction was 60%. Wall motion was   normal; there were no regional wall motion abnormalities. - Aortic valve: There was no stenosis. There was trivial   regurgitation. - Aorta: Normal caliber aorta with minimal plaque. - Left atrium: The atrium was mildly dilated. No evidence of   thrombus in the atrial cavity or appendage. - Right ventricle: The cavity size was normal. Systolic function   was normal. - Right atrium: No evidence of thrombus in the atrial cavity or   appendage. - Atrial septum: No ASD/PFO by color doppler. - Tricuspid valve: Peak RV-RA gradient (S): 18 mm Hg. - Impressions: Severe mitral regurgitation in setting of bileaflet   MVP and partial flail P2 scallop.   Impressions:   - Severe mitral regurgitation in setting of bileaflet MVP and   partial flail P2 scallop.  Cardiac cath 01/18/18: Procedures   RIGHT/LEFT HEART CATH AND CORONARY ANGIOGRAPHY  Conclusion     The left ventricular systolic function is normal. LV end diastolic pressure is normal. The left ventricular ejection fraction is 55-65% by visual estimate. There is severe (4+) mitral regurgitation and moderate mitral valve prolapse.   1. Normal coronary anatomy 2. Normal LV function 3. Mitral valve prolapse with severe MR 3-4+ 4. Normal right heart pressures 5. Normal LV filling pressures   No indication for antiplatelet therapy at this time.    Will refer to Dr. Roxy Manns for  consideration of MV repair   Minimally-Invasive Mitral Valve Repair             Complex valvuloplasty including triangular resection of flail segment (P2) of posterior leaflet             Shortening plasty of posterior leaflet             Artificial Gore-tex neochord placement x6             Sorin Memo 4D Ring Annuloplasty (size 5mm, catalog # [id number], serial # [id number])   Echo 05/12/18: Study Conclusions   - Left ventricle: The cavity size was normal. Wall thickness was    normal. Systolic function was normal. The estimated ejection    fraction was in the range of 50% to 55%. Wall motion was normal;    there were no regional wall motion abnormalities. Doppler    parameters are consistent with abnormal left ventricular    relaxation (grade 1 diastolic dysfunction).  - Aortic valve: There was trivial regurgitation.  - Mitral valve: Prior procedures included surgical repair.   Impressions:   - Low normal LV systolic function; mild diastolic dysfunction;    trace AI; s/p MV repair with no MR.   ASSESSMENT:    1. Severe mitral valve regurgitation   2. Mitral valve prolapse   3. S/P minimally invasive mitral valve repair      PLAN:  In order of problems listed above:  Mitral valve regurgitation secondary to partially flail P2 segment of the MV with MV prolapse. Now s/p MV repair in  October 2019. Follow up Echo showed excellent result.  Hyperlipidemia: excellent control on lipitor.  Hypothyroidism: TSH normal.   Follow up in one year.      Medication Adjustments/Labs and Tests Ordered: Current medicines are reviewed at length with the patient today.  Concerns regarding medicines are outlined above.  Medication changes, Labs and Tests ordered today are listed in the Patient Instructions below. There are no Patient Instructions on file for this visit.   Signed, Aiden Rao Martinique, MD  07/03/2021 10:16 AM    [address], [place], [place]   [address] Phone: [phone]; Fax: [phone]

## 2021-07-03 ENCOUNTER — Encounter: Payer: Self-pay | Admitting: Cardiology

## 2021-07-03 ENCOUNTER — Ambulatory Visit: Payer: Medicare HMO | Admitting: Cardiology

## 2021-07-03 ENCOUNTER — Other Ambulatory Visit: Payer: Self-pay

## 2021-07-03 VITALS — BP 102/74 | HR 77 | Ht 62.0 in | Wt 153.0 lb

## 2021-07-03 DIAGNOSIS — I341 Nonrheumatic mitral (valve) prolapse: Secondary | ICD-10-CM

## 2021-07-03 DIAGNOSIS — I340 Nonrheumatic mitral (valve) insufficiency: Secondary | ICD-10-CM | POA: Diagnosis not present

## 2021-07-03 DIAGNOSIS — Z9889 Other specified postprocedural states: Secondary | ICD-10-CM | POA: Diagnosis not present

## 2021-07-30 DIAGNOSIS — R159 Full incontinence of feces: Secondary | ICD-10-CM | POA: Diagnosis not present

## 2021-07-30 DIAGNOSIS — K5900 Constipation, unspecified: Secondary | ICD-10-CM | POA: Diagnosis not present

## 2021-07-30 DIAGNOSIS — R194 Change in bowel habit: Secondary | ICD-10-CM | POA: Diagnosis not present

## 2021-07-30 DIAGNOSIS — M545 Low back pain, unspecified: Secondary | ICD-10-CM | POA: Diagnosis not present

## 2021-09-15 DIAGNOSIS — R159 Full incontinence of feces: Secondary | ICD-10-CM | POA: Diagnosis not present

## 2021-09-15 DIAGNOSIS — K5900 Constipation, unspecified: Secondary | ICD-10-CM | POA: Diagnosis not present

## 2021-10-01 ENCOUNTER — Ambulatory Visit
Admission: RE | Admit: 2021-10-01 | Discharge: 2021-10-01 | Disposition: A | Discharge status: Home / Self Care | Payer: Medicare HMO | Source: Ambulatory Visit | Attending: Gastroenterology | Admitting: Gastroenterology

## 2021-10-01 ENCOUNTER — Other Ambulatory Visit: Payer: Self-pay | Admitting: Gastroenterology

## 2021-10-01 DIAGNOSIS — K5900 Constipation, unspecified: Secondary | ICD-10-CM | POA: Diagnosis not present

## 2021-10-30 ENCOUNTER — Ambulatory Visit: Payer: Medicare HMO

## 2021-10-31 ENCOUNTER — Other Ambulatory Visit: Payer: Self-pay | Admitting: Family Medicine

## 2021-10-31 DIAGNOSIS — Z1231 Encounter for screening mammogram for malignant neoplasm of breast: Secondary | ICD-10-CM

## 2021-11-05 DIAGNOSIS — K625 Hemorrhage of anus and rectum: Secondary | ICD-10-CM | POA: Insufficient documentation

## 2021-11-05 DIAGNOSIS — F32A Depression, unspecified: Secondary | ICD-10-CM | POA: Insufficient documentation

## 2021-11-05 NOTE — Patient Instructions (Signed)
Maria Gomez , Thank you for taking time to come for your Medicare Wellness Visit. I appreciate your ongoing commitment to your health goals. Please review the following plan we discussed and let me know if I can assist you in the future.   Screening recommendations/referrals: Colonoscopy: Done 01/11/2014 Repeat in 10 years  Mammogram: Done 12/02/2020. Scheduled for 12/03/2021. Bone Density: Done 12/06/2019 Repeat every 2 years  Recommended yearly ophthalmology/optometry visit for glaucoma screening and checkup Recommended yearly dental visit for hygiene and checkup  Vaccinations: Influenza vaccine: Done 03/11/2021 Repeat annually  Pneumococcal vaccine: Done 06/26/2013 and 12/06/2018. Tdap vaccine: Done 04/08/2012 Repeat in 10 years  Shingles vaccine: Done 03/29/2021, 01/15/2021 and 05/20/2012   Covid-19:Done 03/01/2020, 07/10/2019 and 06/19/2019.  Advanced directives: Please bring a copy of your health care power of attorney and living will to the office to be added to your chart at your convenience.   Conditions/risks identified: Aim for 30 minutes of exercise or brisk walking, 6-8 glasses of water, and 5 servings of fruits and vegetables each day.   Next appointment: Follow up in one year for your annual wellness visit 2024.   Preventive Care 41 Years and Older, Female Preventive care refers to lifestyle choices and visits with your health care provider that can promote health and wellness. What does preventive care include? A yearly physical exam. This is also called an annual well check. Dental exams once or twice a year. Routine eye exams. Ask your health care provider how often you should have your eyes checked. Personal lifestyle choices, including: Daily care of your teeth and gums. Regular physical activity. Eating a healthy diet. Avoiding tobacco and drug use. Limiting alcohol use. Practicing safe sex. Taking low-dose aspirin every day. Taking vitamin and mineral  supplements as recommended by your health care provider. What happens during an annual well check? The services and screenings done by your health care provider during your annual well check will depend on your age, overall health, lifestyle risk factors, and family history of disease. Counseling  Your health care provider may ask you questions about your: Alcohol use. Tobacco use. Drug use. Emotional well-being. Home and relationship well-being. Sexual activity. Eating habits. History of falls. Memory and ability to understand (cognition). Work and work Statistician. Reproductive health. Screening  You may have the following tests or measurements: Height, weight, and BMI. Blood pressure. Lipid and cholesterol levels. These may be checked every 5 years, or more frequently if you are over 72 years old. Skin check. Lung cancer screening. You may have this screening every year starting at age 28 if you have a 30-pack-year history of smoking and currently smoke or have quit within the past 15 years. Fecal occult blood test (FOBT) of the stool. You may have this test every year starting at age 62. Flexible sigmoidoscopy or colonoscopy. You may have a sigmoidoscopy every 5 years or a colonoscopy every 10 years starting at age 36. Hepatitis C blood test. Hepatitis B blood test. Sexually transmitted disease (STD) testing. Diabetes screening. This is done by checking your blood sugar (glucose) after you have not eaten for a while (fasting). You may have this done every 1-3 years. Bone density scan. This is done to screen for osteoporosis. You may have this done starting at age 47. Mammogram. This may be done every 1-2 years. Talk to your health care provider about how often you should have regular mammograms. Talk with your health care provider about your test results, treatment options, and if necessary,  the need for more tests. Vaccines  Your health care provider may recommend certain  vaccines, such as: Influenza vaccine. This is recommended every year. Tetanus, diphtheria, and acellular pertussis (Tdap, Td) vaccine. You may need a Td booster every 10 years. Zoster vaccine. You may need this after age 89. Pneumococcal 13-valent conjugate (PCV13) vaccine. One dose is recommended after age 86. Pneumococcal polysaccharide (PPSV23) vaccine. One dose is recommended after age 46. Talk to your health care provider about which screenings and vaccines you need and how often you need them. This information is not intended to replace advice given to you by your health care provider. Make sure you discuss any questions you have with your health care provider. Document Released: 06/07/2015 Document Revised: 01/29/2016 Document Reviewed: 03/12/2015 Elsevier Interactive Patient Education  2017 [place] Prevention in the Home Falls can cause injuries. They can happen to people of all ages. There are many things you can do to make your home safe and to help prevent falls. What can I do on the outside of my home? Regularly fix the edges of walkways and driveways and fix any cracks. Remove anything that might make you trip as you walk through a door, such as a raised step or threshold. Trim any bushes or trees on the path to your home. Use bright outdoor lighting. Clear any walking paths of anything that might make someone trip, such as rocks or tools. Regularly check to see if handrails are loose or broken. Make sure that both sides of any steps have handrails. Any raised decks and porches should have guardrails on the edges. Have any leaves, snow, or ice cleared regularly. Use sand or salt on walking paths during winter. Clean up any spills in your garage right away. This includes oil or grease spills. What can I do in the bathroom? Use night lights. Install grab bars by the toilet and in the tub and shower. Do not use towel bars as grab bars. Use non-skid mats or decals in  the tub or shower. If you need to sit down in the shower, use a plastic, non-slip stool. Keep the floor dry. Clean up any water that spills on the floor as soon as it happens. Remove soap buildup in the tub or shower regularly. Attach bath mats securely with double-sided non-slip rug tape. Do not have throw rugs and other things on the floor that can make you trip. What can I do in the bedroom? Use night lights. Make sure that you have a light by your bed that is easy to reach. Do not use any sheets or blankets that are too big for your bed. They should not hang down onto the floor. Have a firm chair that has side arms. You can use this for support while you get dressed. Do not have throw rugs and other things on the floor that can make you trip. What can I do in the kitchen? Clean up any spills right away. Avoid walking on wet floors. Keep items that you use a lot in easy-to-reach places. If you need to reach something above you, use a strong step stool that has a grab bar. Keep electrical cords out of the way. Do not use floor polish or wax that makes floors slippery. If you must use wax, use non-skid floor wax. Do not have throw rugs and other things on the floor that can make you trip. What can I do with my stairs? Do not leave any items on  the stairs. Make sure that there are handrails on both sides of the stairs and use them. Fix handrails that are broken or loose. Make sure that handrails are as long as the stairways. Check any carpeting to make sure that it is firmly attached to the stairs. Fix any carpet that is loose or worn. Avoid having throw rugs at the top or bottom of the stairs. If you do have throw rugs, attach them to the floor with carpet tape. Make sure that you have a light switch at the top of the stairs and the bottom of the stairs. If you do not have them, ask someone to add them for you. What else can I do to help prevent falls? Wear shoes that: Do not have high  heels. Have rubber bottoms. Are comfortable and fit you well. Are closed at the toe. Do not wear sandals. If you use a stepladder: Make sure that it is fully opened. Do not climb a closed stepladder. Make sure that both sides of the stepladder are locked into place. Ask someone to hold it for you, if possible. Clearly mark and make sure that you can see: Any grab bars or handrails. First and last steps. Where the edge of each step is. Use tools that help you move around (mobility aids) if they are needed. These include: Canes. Walkers. Scooters. Crutches. Turn on the lights when you go into a dark area. Replace any light bulbs as soon as they burn out. Set up your furniture so you have a clear path. Avoid moving your furniture around. If any of your floors are uneven, fix them. If there are any pets around you, be aware of where they are. Review your medicines with your doctor. Some medicines can make you feel dizzy. This can increase your chance of falling. Ask your doctor what other things that you can do to help prevent falls. This information is not intended to replace advice given to you by your health care provider. Make sure you discuss any questions you have with your health care provider. Document Released: 03/07/2009 Document Revised: 10/17/2015 Document Reviewed: 06/15/2014 Elsevier Interactive Patient Education  2017 [employer].

## 2021-11-06 ENCOUNTER — Ambulatory Visit (INDEPENDENT_AMBULATORY_CARE_PROVIDER_SITE_OTHER): Payer: Medicare HMO

## 2021-11-06 VITALS — BP 110/56 | HR 74 | Ht 62.0 in | Wt 142.0 lb

## 2021-11-06 DIAGNOSIS — Z0000 Encounter for general adult medical examination without abnormal findings: Secondary | ICD-10-CM

## 2021-11-06 DIAGNOSIS — Z780 Asymptomatic menopausal state: Secondary | ICD-10-CM | POA: Diagnosis not present

## 2021-11-06 NOTE — Progress Notes (Signed)
Subjective:   Estie Sproule is a 76 y.o. female who presents for Medicare Annual (Subsequent) preventive examination.  Review of Systems     Cardiac Risk Factors include: advanced age (>81mn, >>60women);sedentary lifestyle;Other (see comment), Risk factor comments: Mitral regurgitation, hypothyroidism, heart murmer.     Objective:    Today's Vitals   11/06/21 1011  BP: (!) 110/56  Pulse: 74  SpO2: 100%  Weight: 142 lb (64.4 kg)  Height: '5\' 2"'$  (1.575 m)   Body mass index is 25.97 kg/m.     11/06/2021   10:29 AM 10/24/2020   10:31 AM 04/11/2018   11:39 AM 03/11/2018    5:00 PM 03/07/2018   11:13 AM 01/18/2018    6:40 AM 11/09/2017    9:52 AM  Advanced Directives  Does Patient Have a Medical Advance Directive? Yes No Yes Yes Yes Yes Yes  Type of AParamedicof AMorovisLiving will  Healthcare Power of [place]acelandLiving will HFrontenacLiving will HSouth NyackLiving will HCreve CoeurLiving will  Does patient want to make changes to medical advance directive?   No - Patient declined No - Patient declined No - Patient declined No - Patient declined   Copy of HBangorin Chart? No - copy requested  No - copy requested No - copy requested No - copy requested  No - copy requested  Would patient like information on creating a medical advance directive?  No - Patient declined         Current Medications (verified) Outpatient Encounter Medications as of 11/06/2021  Medication Sig   atorvastatin (LIPITOR) 20 MG tablet Take 1 tablet (20 mg total) by mouth daily.   Biotin 5000 MCG TABS Take 5,000 mcg by mouth daily.    Calcium Carb-Cholecalciferol (CALTRATE 600+D3) 600-20 MG-MCG TABS 2 tablet with a meal   Coenzyme Q10 (CO Q 10 PO) Take 800 mg by mouth daily.    diphenhydrAMINE HCl, Sleep, (UNISOM SLEEPGELS) 50 MG CAPS 1 capsule at bedtime as needed   Fiber  Adult Gummies 2 g CHEW 2 tablets   levothyroxine (SYNTHROID) 25 MCG tablet TAKE 1 TABLET BY MOUTH EVERY DAY BEFORE BREAKFAST   Melatonin 10-10 MG TBCR 2 tablet at bedtime as needed   Multiple Vitamin (MULTIVITAMIN) tablet Take 1 tablet by mouth daily. Centrum Silver   Polyethylene Glycol 400 0.25 % SOLN Apply to eye.   vitamin B-12 (CYANOCOBALAMIN) 1000 MCG tablet Take 1,000 mcg by mouth daily.   zinc gluconate 50 MG tablet Take 50 mg by mouth daily.   [DISCONTINUED] Calcium Carbonate 500 MG CHEW 1 tablet   [DISCONTINUED] Calcium Carbonate Antacid (CALCIUM CARBONATE PO) Take 1 capsule by mouth in the morning and at bedtime.   [DISCONTINUED] Cholecalciferol (D3) 10 MCG (400 UNIT) CHEW See admin instructions.   [DISCONTINUED] Melatonin 5 MG CAPS Take 10 mg by mouth at bedtime.   [DISCONTINUED] polyethylene glycol (GAVILYTE-G) 236 g solution Take as directed by office instructions   [DISCONTINUED] polyethylene glycol powder (MIRALAX) 17 GM/SCOOP powder See admin instructions.   No facility-administered encounter medications on file as of 11/06/2021.    Allergies (verified) Niaspan [niacin er], Phenazopyridine hcl, and Statins   History: Past Medical History:  Diagnosis Date   Hypothyroidism    Mitral regurgitation    Osteopenia    S/P minimally invasive mitral valve repair 03/10/2018   Complex valvuloplasty including triangular resection of posterior leaflet, artificial Gore-tex  neochord x6 and Size 30 Sorin Memo 4D Ring SN [id number] via right mini thoracotomy approach   Thyroid nodule 03/07/2018   Incidental - noted on CT scan, atypical cells found on fine-needle aspiration.  General surgery consultation and excision recommended February 2020   Past Surgical History:  Procedure Laterality Date   BREAST EXCISIONAL BIOPSY Bilateral    MITRAL VALVE REPAIR Right 03/10/2018   Procedure: MINIMALLY INVASIVE MITRAL VALVE REPAIR (MVR):  -Triangular Resection of Posterior Leaflet P2; -Shortening  Plasty of Posterior Leaflet P2; -Placement of Goretex Neo Chords x 6 using Chord-X system; -Ring Annuloplasty with a 30 mm Sorin Memo 4D Ring;  Surgeon: Rexene Alberts, MD;  Location: [facility];  Service: Open Heart Surgery;  Laterality: Right;   RIGHT/LEFT HEART CATH AND CORONARY ANGIOGRAPHY N/A 01/18/2018   Procedure: RIGHT/LEFT HEART CATH AND CORONARY ANGIOGRAPHY;  Surgeon: Martinique, Peter M, MD;  Location: [facility];  Service: Cardiovascular;  Laterality: N/A;   TEE WITHOUT CARDIOVERSION N/A 11/09/2017   Procedure: TRANSESOPHAGEAL ECHOCARDIOGRAM (TEE);  Surgeon: Larey Dresser, MD;  Location: [facility];  Service: Cardiovascular;  Laterality: N/A;   TEE WITHOUT CARDIOVERSION N/A 03/10/2018   Procedure: TRANSESOPHAGEAL ECHOCARDIOGRAM (TEE);  Surgeon: Rexene Alberts, MD;  Location: [place] [facility];  Service: Open Heart Surgery;  Laterality: N/A;   Family History  Problem Relation Age of Onset   Heart disease Father    Cancer Brother 39       ColoRectal Cancer   Cancer Maternal Grandmother 86   Stroke Maternal Grandfather 76   Stroke Paternal Grandfather    Social History   Socioeconomic History   Marital status: Widowed    Spouse name: Not on file   Number of children: Not on file   Years of education: Not on file   Highest education level: Not on file  Occupational History   Not on file  Tobacco Use   Smoking status: Never   Smokeless tobacco: Never  Vaping Use   Vaping Use: Never used  Substance and Sexual Activity   Alcohol use: No   Drug use: No   Sexual activity: Not on file  Other Topics Concern   Not on file  Social History Narrative   Lives with her mom.    Takes care of aunt who lives across the street.    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         Social Determinants of Health   Financial Resource Strain: Low Risk  (11/06/2021)   Overall Financial Resource Strain (CARDIA)    Difficulty of Paying Living Expenses: Not hard at all   Food Insecurity: No Food Insecurity (11/06/2021)   Hunger Vital Sign    Worried About Running Out of Food in the Last Year: Never true    Ran Out of Food in the Last Year: Never true  Transportation Needs: No Transportation Needs (11/06/2021)   PRAPARE - Hydrologist (Medical): No    Lack of Transportation (Non-Medical): No  Physical Activity: Insufficiently Active (11/06/2021)   Exercise Vital Sign    Days of Exercise per Week: 5 days    Minutes of Exercise per Session: 20 min  Stress: No Stress Concern Present (11/06/2021)   [place]    Feeling of Stress : Not at all  Social Connections: Moderately Integrated (11/06/2021)   Social Connection and Isolation Panel [NHANES]  Frequency of Communication with Friends and Family: More than three times a week    Frequency of Social Gatherings with Friends and Family: More than three times a week    Attends Religious Services: More than 4 times per year    Active Member of [employer] or Organizations: Yes    Attends Archivist Meetings: More than 4 times per year    Marital Status: Widowed    Tobacco Counseling Counseling given: Not Answered   Clinical Intake:  Pre-visit preparation completed: Yes  Pain : No/denies pain     BMI - recorded: 25.97 Nutritional Status: BMI 25 -29 Overweight Nutritional Risks: Unintentional weight loss (Pt has been having issues with constipation. Has seen [facility]. I currently on eating one meal per day.) Diabetes: No  How often do you need to have someone help you when you read instructions, pamphlets, or other written materials from your doctor or pharmacy?: 1 - Never  Diabetic?NO   Interpreter Needed?: No  Information entered by :: mj Zareena Willis, lpn   Activities of Daily Living    11/06/2021   10:35 AM  In your present state of health, do you have any difficulty performing the following  activities:  Hearing? 0  Vision? 0  Difficulty concentrating or making decisions? 0  Walking or climbing stairs? 0  Dressing or bathing? 0  Doing errands, shopping? 0  Preparing Food and eating ? N  Using the Toilet? N  In the past six months, have you accidently leaked urine? Y  Comment AT TIMES.  Do you have problems with loss of bowel control? N  Managing your Medications? N  Managing your Finances? N  Housekeeping or managing your Housekeeping? N    Patient Care Team: Susy Frizzle, MD as PCP - General (Family Medicine) Martinique, Peter M, MD as PCP - Cardiology (Cardiology)  Indicate any recent Medical Services you may have received from other than [facility] providers in the past year (date may be approximate).     Assessment:   This is a routine wellness examination for Marzella.  Hearing/Vision screen Hearing Screening - Comments:: No hearing issues.  Vision Screening - Comments:: Readers. Dr Valetta Close. 2022.  Dietary issues and exercise activities discussed: Current Exercise Habits: Home exercise routine, Type of exercise: walking, Time (Minutes): 20, Frequency (Times/Week): 5, Weekly Exercise (Minutes/Week): 100, Intensity: Mild, Exercise limited by: cardiac condition(s);orthopedic condition(s)   Goals Addressed             This Visit's Progress    Patient Stated   On track    Maintain current exercise regimen       Depression Screen    11/06/2021   10:23 AM 10/24/2020   10:51 AM 12/06/2018    9:22 AM 04/11/2018   10:32 AM 04/04/2018    2:28 PM 08/23/2017    9:41 AM 05/10/2017   12:03 PM  PHQ 2/9 Scores  PHQ - 2 Score 0 0 3 0 0 0 0  PHQ- 9 Score   6 2       Fall Risk    11/06/2021   10:31 AM 10/24/2020   10:34 AM 12/06/2018    9:22 AM 04/11/2018   11:37 AM 04/04/2018    2:28 PM  Fall Risk   Falls in the past year? 0 0 0 0 0  Number falls in past yr: 0 0     Injury with Fall? 0 0     Risk for fall due to : No  Fall Risks      Follow up Falls prevention  discussed Falls prevention discussed;Falls evaluation completed Falls evaluation completed      FALL RISK PREVENTION PERTAINING TO THE HOME:  Any stairs in or around the home? Yes  If so, are there any without handrails? No  Home free of loose throw rugs in walkways, pet beds, electrical cords, etc? Yes  Adequate lighting in your home to reduce risk of falls? Yes   ASSISTIVE DEVICES UTILIZED TO PREVENT FALLS:  Life alert? No  Use of a cane, walker or w/c? No  Grab bars in the bathroom? Yes  Shower chair or bench in shower? Yes  Elevated toilet seat or a handicapped toilet? No   TIMED UP AND GO:  Was the test performed? Yes .  Length of time to ambulate 10 feet: 10 sec.   Gait steady and fast without use of assistive device  Cognitive Function:        11/06/2021   10:38 AM  6CIT Screen  What Year? 0 points  What month? 0 points  What time? 0 points  Count back from 20 0 points  Months in reverse 0 points  Repeat phrase 0 points  Total Score 0 points    Immunizations Immunization History  Administered Date(s) Administered   Fluad Quad(high Dose 65+) 01/20/2019   Influenza, High Dose Seasonal PF 03/01/2017, 02/15/2018, 01/05/2019, 03/11/2021   Influenza-Unspecified 04/08/2012, 03/25/2013, 03/29/2014   PFIZER(Purple Top)SARS-COV-2 Vaccination 06/19/2019, 07/10/2019, 03/01/2020   Pneumococcal Conjugate-13 06/26/2013   Pneumococcal Polysaccharide-23 12/06/2018   Td 10/24/1996   Tdap 04/08/2012   Zoster Recombinat (Shingrix) 01/15/2021, 03/29/2021   Zoster, Live 05/20/2012    TDAP status: Up to date  Flu Vaccine status: Up to date  Pneumococcal vaccine status: Up to date  Covid-19 vaccine status: Completed vaccines  Qualifies for Shingles Vaccine? Yes   Zostavax completed Yes   Shingrix Completed?: Yes  Screening Tests Health Maintenance  Topic Date Due   COVID-19 Vaccine (4 - Pfizer series) 11/22/2021 (Originally 04/26/2020)   Hepatitis C Screening   01/22/2022 (Originally 11/20/1963)   INFLUENZA VACCINE  12/23/2021   TETANUS/TDAP  04/08/2022   COLONOSCOPY (Pts 45-47yr Insurance coverage will need to be confirmed)  01/12/2024   Pneumonia Vaccine 76 Years old  Completed   DEXA SCAN  Completed   Zoster Vaccines- Shingrix  Completed   HPV VACCINES  Aged Out    Health Maintenance  There are no preventive care reminders to display for this patient.   Colorectal cancer screening: Type of screening: Colonoscopy. Completed 01/12/2020. Repeat every 10 years  Mammogram status: Completed 12/02/2021. Repeat every year  Bone Density status: Ordered 11/06/2021. Pt provided with contact info and advised to call to schedule appt.  Lung Cancer Screening: (Low Dose CT Chest recommended if Age 453-80years, 30 pack-year currently smoking OR have quit w/in 15years.) does not qualify.   Additional Screening:  Hepatitis C Screening: does qualify; Completed DUE  Vision Screening: Recommended annual ophthalmology exams for early detection of glaucoma and other disorders of the eye. Is the patient up to date with their annual eye exam?  Yes  Who is the provider or what is the name of the office in which the patient attends annual eye exams? [facility] If pt is not established with a provider, would they like to be referred to a provider to establish care? No .   Dental Screening: Recommended annual dental exams for proper oral hygiene  Community Resource Referral /  Chronic Care Management: CRR required this visit?  No   CCM required this visit?  No      Plan:     I have personally reviewed and noted the following in the patient's chart:   Medical and social history Use of alcohol, tobacco or illicit drugs  Current medications and supplements including opioid prescriptions.  Functional ability and status Nutritional status Physical activity Advanced directives List of other physicians Hospitalizations, surgeries, and ER  visits in previous 12 months Vitals Screenings to include cognitive, depression, and falls Referrals and appointments  In addition, I have reviewed and discussed with patient certain preventive protocols, quality metrics, and best practice recommendations. A written personalized care plan for preventive services as well as general preventive health recommendations were provided to patient.     Chriss Driver, LPN   [id number]   Nurse Notes: Discussed DEXA. Pt would like to repeat. Order placed.

## 2021-11-10 ENCOUNTER — Ambulatory Visit
Admission: RE | Admit: 2021-11-10 | Discharge: 2021-11-10 | Disposition: A | Discharge status: Home / Self Care | Payer: Medicare HMO | Source: Ambulatory Visit | Attending: Family Medicine | Admitting: Family Medicine

## 2021-11-10 ENCOUNTER — Ambulatory Visit (INDEPENDENT_AMBULATORY_CARE_PROVIDER_SITE_OTHER): Payer: Medicare HMO | Admitting: Family Medicine

## 2021-11-10 VITALS — BP 116/72 | HR 81 | Temp 98.8°F | Ht 62.0 in | Wt 145.0 lb

## 2021-11-10 DIAGNOSIS — R051 Acute cough: Secondary | ICD-10-CM

## 2021-11-10 DIAGNOSIS — R059 Cough, unspecified: Secondary | ICD-10-CM | POA: Diagnosis not present

## 2021-11-10 MED ORDER — BENZONATATE 200 MG PO CAPS: 200.0000 mg | ORAL_CAPSULE | Freq: Three times a day (TID) | ORAL | 0 refills | Status: DC | PRN

## 2021-11-10 NOTE — Progress Notes (Signed)
Subjective:    Patient ID: Maria Gomez, female    DOB: Aug 20, 1945, 76 y.o.   MRN: [id number]  Medication Refill Associated symptoms include coughing.  Cough  Patient states that she has had a cough for 3 weeks.  It is typically worse when she sitting upright during morning.  When she lays down at night it actually gets better.  The cough is nonproductive.  She denies any chest pain, shortness of breath, fevers, chills.  She denies any rhinorrhea.  She denies any postnasal drip.  She denies any sore throat.  She denies any pleurisy. Past Medical History:  Diagnosis Date   Hypothyroidism    Mitral regurgitation    Osteopenia    S/P minimally invasive mitral valve repair 03/10/2018   Complex valvuloplasty including triangular resection of posterior leaflet, artificial Gore-tex neochord x6 and Size 30 Sorin Memo 4D Ring SN [id number] via right mini thoracotomy approach   Thyroid nodule 03/07/2018   Incidental - noted on CT scan, atypical cells found on fine-needle aspiration.  General surgery consultation and excision recommended February 2020   Past Surgical History:  Procedure Laterality Date   BREAST EXCISIONAL BIOPSY Bilateral    MITRAL VALVE REPAIR Right 03/10/2018   Procedure: MINIMALLY INVASIVE MITRAL VALVE REPAIR (MVR):  -Triangular Resection of Posterior Leaflet P2; -Shortening Plasty of Posterior Leaflet P2; -Placement of Goretex Neo Chords x 6 using Chord-X system; -Ring Annuloplasty with a 30 mm Sorin Memo 4D Ring;  Surgeon: Rexene Alberts, MD;  Location: [facility];  Service: Open Heart Surgery;  Laterality: Right;   RIGHT/LEFT HEART CATH AND CORONARY ANGIOGRAPHY N/A 01/18/2018   Procedure: RIGHT/LEFT HEART CATH AND CORONARY ANGIOGRAPHY;  Surgeon: Martinique, Peter M, MD;  Location: [place] [facility];  Service: Cardiovascular;  Laterality: N/A;   TEE WITHOUT CARDIOVERSION N/A 11/09/2017   Procedure: TRANSESOPHAGEAL ECHOCARDIOGRAM (TEE);  Surgeon: Larey Dresser, MD;  Location:  [facility] ENDOSCOPY;  Service: Cardiovascular;  Laterality: N/A;   TEE WITHOUT CARDIOVERSION N/A 03/10/2018   Procedure: TRANSESOPHAGEAL ECHOCARDIOGRAM (TEE);  Surgeon: Rexene Alberts, MD;  Location: [place];  Service: Open Heart Surgery;  Laterality: N/A;   Current Outpatient Medications on File Prior to Visit  Medication Sig Dispense Refill   atorvastatin (LIPITOR) 20 MG tablet Take 1 tablet (20 mg total) by mouth daily. 90 tablet 3   Biotin 5000 MCG TABS Take 5,000 mcg by mouth daily.      Calcium Carb-Cholecalciferol (CALTRATE 600+D3) 600-20 MG-MCG TABS 2 tablet with a meal     Coenzyme Q10 (CO Q 10 PO) Take 800 mg by mouth daily.      diphenhydrAMINE HCl, Sleep, (UNISOM SLEEPGELS) 50 MG CAPS 1 capsule at bedtime as needed     Fiber Adult Gummies 2 g CHEW 2 tablets     levothyroxine (SYNTHROID) 25 MCG tablet TAKE 1 TABLET BY MOUTH EVERY DAY BEFORE BREAKFAST 90 tablet 1   Melatonin 10-10 MG TBCR 2 tablet at bedtime as needed     Multiple Vitamin (MULTIVITAMIN) tablet Take 1 tablet by mouth daily. Centrum Silver     Polyethylene Glycol 400 0.25 % SOLN Apply to eye.     vitamin B-12 (CYANOCOBALAMIN) 1000 MCG tablet Take 1,000 mcg by mouth daily.     zinc gluconate 50 MG tablet Take 50 mg by mouth daily.     No current facility-administered medications on file prior to visit.   Allergies  Allergen Reactions   Niaspan [Niacin Er] Other (See Comments)  Severe flushing   Phenazopyridine Hcl Other (See Comments)    Made sick    Statins     Severe flushing   Social History   Socioeconomic History   Marital status: Widowed    Spouse name: Not on file   Number of children: Not on file   Years of education: Not on file   Highest education level: Not on file  Occupational History   Not on file  Tobacco Use   Smoking status: Never   Smokeless tobacco: Never  Vaping Use   Vaping Use: Never used  Substance and Sexual Activity   Alcohol use: No   Drug use: No   Sexual activity: Not on  file  Other Topics Concern   Not on file  Social History Narrative   Lives with her mom.    Takes care of aunt who lives across the street.    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         Social Determinants of Health   Financial Resource Strain: Low Risk  (11/06/2021)   Overall Financial Resource Strain (CARDIA)    Difficulty of Paying Living Expenses: Not hard at all  Food Insecurity: No Food Insecurity (11/06/2021)   Hunger Vital Sign    Worried About Running Out of Food in the Last Year: Never true    Ran Out of Food in the Last Year: Never true  Transportation Needs: No Transportation Needs (11/06/2021)   PRAPARE - Hydrologist (Medical): No    Lack of Transportation (Non-Medical): No  Physical Activity: Insufficiently Active (11/06/2021)   Exercise Vital Sign    Days of Exercise per Week: 5 days    Minutes of Exercise per Session: 20 min  Stress: No Stress Concern Present (11/06/2021)   [place]    Feeling of Stress : Not at all  Social Connections: Moderately Integrated (11/06/2021)   Social Connection and Isolation Panel [NHANES]    Frequency of Communication with Friends and Family: More than three times a week    Frequency of Social Gatherings with Friends and Family: More than three times a week    Attends Religious Services: More than 4 times per year    Active Member of [employer] or Organizations: Yes    Attends Archivist Meetings: More than 4 times per year    Marital Status: Widowed  Intimate Partner Violence: Not At Risk (11/06/2021)   Humiliation, Afraid, Rape, and Kick questionnaire    Fear of Current or Ex-Partner: No    Emotionally Abused: No    Physically Abused: No    Sexually Abused: No      Review of Systems  Respiratory:  Positive for cough.   All other systems reviewed and are negative.      Objective:   Physical  Exam Vitals reviewed.  Constitutional:      General: She is not in acute distress.    Appearance: Normal appearance. She is normal weight. She is not ill-appearing, toxic-appearing or diaphoretic.  HENT:     Head: Normocephalic and atraumatic.     Nose: Nose normal. No congestion or rhinorrhea.     Mouth/Throat:     Pharynx: No oropharyngeal exudate or posterior oropharyngeal erythema.  Eyes:     General: No scleral icterus.       Right eye: No discharge.  Left eye: No discharge.     Extraocular Movements: Extraocular movements intact.     Conjunctiva/sclera: Conjunctivae normal.     Pupils: Pupils are equal, round, and reactive to light.  Neck:     Thyroid: No thyromegaly.     Vascular: No JVD.     Trachea: No tracheal deviation.  Cardiovascular:     Rate and Rhythm: Normal rate and regular rhythm.     Heart sounds: Murmur heard.     No friction rub. No gallop.  Pulmonary:     Effort: Pulmonary effort is normal. No respiratory distress.     Breath sounds: Normal breath sounds. No stridor. No wheezing, rhonchi or rales.  Chest:     Chest wall: No tenderness.  Musculoskeletal:        General: No swelling, tenderness or deformity.     Cervical back: No muscular tenderness.     Right lower leg: No edema.     Left lower leg: No edema.  Skin:    General: Skin is warm.     Coloration: Skin is not jaundiced or pale.     Findings: No bruising, erythema, lesion or rash.  Neurological:     Mental Status: She is alert.             Assessment & Plan:  Acute cough - Plan: DG Chest 2 View Patient's physical exam today is normal.  Her lungs sound completely clear.  She does cough frequently throughout our encounter however the cough is nonproductive.  Begin by obtaining a chest x-ray.  I will give the patient Tessalon Perles 200 mg every 8 hours to try to stop the cycle of coughing.  I believe at the present time is more of an irritation like bronchitis than any acute  infection.  She is afebrile.  Pulse oximetry is normal.  Reassess in 1 week if no better or sooner if worsening

## 2021-11-12 ENCOUNTER — Other Ambulatory Visit: Payer: Self-pay | Admitting: Family Medicine

## 2021-11-12 NOTE — Telephone Encounter (Signed)
Requested Prescriptions  Pending Prescriptions Disp Refills  . levothyroxine (SYNTHROID) 25 MCG tablet [Pharmacy Med Name: LEVOTHYROXINE 25 MCG TABLET] 90 tablet 1    Sig: TAKE 1 TABLET BY MOUTH EVERY DAY BEFORE BREAKFAST     Endocrinology:  Hypothyroid Agents Failed - 11/12/2021 11:42 AM      Failed - Valid encounter within last 12 months    Recent Outpatient Visits          10 months ago Encounter for Medicare annual wellness exam   [place] Susy Frizzle, MD   1 year ago Hypothyroidism, unspecified type   [place] Susy Frizzle, MD   2 years ago Hypothyroidism, unspecified type   [place] Susy Frizzle, MD   2 years ago Chronic constipation   [place] Dennard Schaumann, Cammie Mcgee, MD   2 years ago Thyroid nodule, cold   [place], Cammie Mcgee, MD             Passed - TSH in normal range and within 360 days    TSH  Date Value Ref Range Status  01/13/2021 4.18 0.40 - 4.50 mIU/L Final

## 2021-12-03 ENCOUNTER — Ambulatory Visit: Payer: Medicare HMO

## 2021-12-16 ENCOUNTER — Ambulatory Visit: Payer: Medicare HMO

## 2021-12-23 DIAGNOSIS — C801 Malignant (primary) neoplasm, unspecified: Secondary | ICD-10-CM

## 2021-12-23 HISTORY — DX: Malignant (primary) neoplasm, unspecified: C80.1

## 2021-12-30 ENCOUNTER — Ambulatory Visit
Admission: RE | Admit: 2021-12-30 | Discharge: 2021-12-30 | Disposition: A | Discharge status: Home / Self Care | Payer: Medicare HMO | Source: Ambulatory Visit | Attending: Family Medicine | Admitting: Family Medicine

## 2021-12-30 DIAGNOSIS — Z1231 Encounter for screening mammogram for malignant neoplasm of breast: Secondary | ICD-10-CM | POA: Diagnosis not present

## 2021-12-31 DIAGNOSIS — R159 Full incontinence of feces: Secondary | ICD-10-CM | POA: Diagnosis not present

## 2021-12-31 DIAGNOSIS — K5900 Constipation, unspecified: Secondary | ICD-10-CM | POA: Diagnosis not present

## 2022-01-01 ENCOUNTER — Other Ambulatory Visit: Payer: Self-pay | Admitting: Family Medicine

## 2022-01-01 DIAGNOSIS — R928 Other abnormal and inconclusive findings on diagnostic imaging of breast: Secondary | ICD-10-CM

## 2022-01-08 ENCOUNTER — Ambulatory Visit
Admission: RE | Admit: 2022-01-08 | Discharge: 2022-01-08 | Disposition: A | Discharge status: Home / Self Care | Payer: Medicare HMO | Source: Ambulatory Visit | Attending: Family Medicine | Admitting: Family Medicine

## 2022-01-08 ENCOUNTER — Other Ambulatory Visit: Payer: Self-pay | Admitting: Family Medicine

## 2022-01-08 DIAGNOSIS — R921 Mammographic calcification found on diagnostic imaging of breast: Secondary | ICD-10-CM | POA: Diagnosis not present

## 2022-01-08 DIAGNOSIS — R928 Other abnormal and inconclusive findings on diagnostic imaging of breast: Secondary | ICD-10-CM

## 2022-01-28 ENCOUNTER — Ambulatory Visit
Admission: RE | Admit: 2022-01-28 | Discharge: 2022-01-28 | Disposition: A | Discharge status: Home / Self Care | Payer: Medicare HMO | Source: Ambulatory Visit | Attending: Family Medicine | Admitting: Family Medicine

## 2022-01-28 DIAGNOSIS — R921 Mammographic calcification found on diagnostic imaging of breast: Secondary | ICD-10-CM | POA: Diagnosis not present

## 2022-01-28 DIAGNOSIS — D0512 Intraductal carcinoma in situ of left breast: Secondary | ICD-10-CM | POA: Diagnosis not present

## 2022-02-04 ENCOUNTER — Ambulatory Visit: Payer: Self-pay | Admitting: General Surgery

## 2022-02-04 DIAGNOSIS — D0512 Intraductal carcinoma in situ of left breast: Secondary | ICD-10-CM | POA: Diagnosis not present

## 2022-02-05 ENCOUNTER — Other Ambulatory Visit: Payer: Self-pay | Admitting: General Surgery

## 2022-02-05 DIAGNOSIS — D0512 Intraductal carcinoma in situ of left breast: Secondary | ICD-10-CM

## 2022-02-10 ENCOUNTER — Encounter (HOSPITAL_BASED_OUTPATIENT_CLINIC_OR_DEPARTMENT_OTHER): Payer: Self-pay | Admitting: General Surgery

## 2022-02-10 ENCOUNTER — Other Ambulatory Visit: Payer: Self-pay

## 2022-02-12 ENCOUNTER — Telehealth: Payer: Self-pay | Admitting: Radiation Oncology

## 2022-02-12 NOTE — Telephone Encounter (Signed)
9/21 @ @ 3:00 pm Left voicemail for patient to all our office.

## 2022-02-13 ENCOUNTER — Telehealth: Payer: Self-pay | Admitting: Hematology and Oncology

## 2022-02-13 NOTE — Progress Notes (Signed)
Location of Breast Cancer: ductal carcinoma in situ in the upper portion of the left breast  Histology per Pathology Report:   01/28/2022 Diagnosis Breast, left, needle core biopsy, upper central, coil clip - DUCTAL CARCINOMA IN SITU, SOLID TYPE WITH COMEDONECROSIS AND ASSOCIATED CALCIFICATIONS, NUCLEAR GRADE 2-3 OF 3 - NECROSIS: PRESENT - CALCIFICATIONS: PRESENT - DCIS LENGTH: 4 MM IN GREATEST LINEAR DIMENSION ON FRAGMENTED CORES  Receptor Status: ER(100%), PR (70%)  Did patient present with symptoms (if so, please note symptoms) or was this found on screening mammography?: screening mammogram  Past/Anticipated interventions by surgeon, if any: left breast lumpectomy with radioactive seed localization by Dr. Marlou Starks - scheduled for 02/18/2022  Past/Anticipated interventions by medical oncology, if any: ***  Lymphedema issues, if any:  {:18581} {t:21944}   Pain issues, if any:  {:18581} {PAIN DESCRIPTION:21022940}  SAFETY ISSUES: Prior radiation? {:18581} Pacemaker/ICD? {:18581} Possible current pregnancy?no Is the patient on methotrexate? {:18581}  Current Complaints / other details:  ***    Jacqulyn Liner, RN 02/13/2022,7:40 AM

## 2022-02-13 NOTE — Telephone Encounter (Signed)
Scheduled appt per 9/21 referral. Pt is aware of appt date and time. Pt is aware to arrive 15 mins prior to appt time and to bring and updated insurance card. Pt is aware of appt location.

## 2022-02-15 ENCOUNTER — Other Ambulatory Visit: Payer: Self-pay | Admitting: Family Medicine

## 2022-02-15 NOTE — Progress Notes (Signed)
Radiation Oncology         [phone] ________________________________  Initial Outpatient Consultation  Name: Maria Gomez MRN: [id number]  Date: 02/16/2022  DOB: [birth year]  [id number], Cammie Mcgee, MD  Jovita Kussmaul, MD   REFERRING PHYSICIAN: Autumn Messing III, MD  DIAGNOSIS: The encounter diagnosis was Ductal carcinoma in situ (DCIS) of left breast.  Stage 0 (cTis (DCIS), cN0, cM0) Left Breast, Intermediate to high-grade DCIS, ER+ / PR+ / Her2 not assessed  HISTORY OF PRESENT ILLNESS::Maria Gomez is a 76 y.o. female who is seen as a courtesy of Dr. Marlou Starks for an opinion concerning radiation therapy as part of management for her recently diagnosed left breast DCIS.   The patient presented for a routine screening mammogram on 12/30/21 which revealed new calcifications in the left breast. Diagnostic left breast mammogram with CAD on 01/08/22 further revealed indeterminate calcifications spanning 3 mm in the upper central left breast.  Biopsy of the upper central left breast on 01/28/22 showed intermediate to high grade DCIS measuring 4 mm in the greatest linear extent of the sample with comedonecrosis and associated calcifications. Prognostic indicators significant for: estrogen receptor 100% positive and progesterone receptor 70% positive, both with strong staining intensity; Her2 not assessed.    Accordingly, the patient was referred to Dr. Marlou Starks on 02/04/22 to discuss surgical options. Physical exam performed during this visit revealed no palpable breast masses, or palpable axillary, supraclavicular, or cervical lymphadenopathy. Following discussion of the risks and benefits, the patient agreed to proceed with breast conserving surgery. She is scheduled for surgery on 02/18/22 with Dr. Marlou Starks.   PREVIOUS RADIATION THERAPY: No  PAST MEDICAL HISTORY:  Past Medical History:  Diagnosis Date   Cancer ([place]) 12/2021   left breast DCIS   Hypothyroidism    Mitral  regurgitation    Osteopenia    S/P minimally invasive mitral valve repair 03/10/2018   Complex valvuloplasty including triangular resection of posterior leaflet, artificial Gore-tex neochord x6 and Size 30 Sorin Memo 4D Ring SN [id number] via right mini thoracotomy approach   Thyroid nodule 03/07/2018   Incidental - noted on CT scan, atypical cells found on fine-needle aspiration.  General surgery consultation and excision recommended February 2020    PAST SURGICAL HISTORY: Past Surgical History:  Procedure Laterality Date   BREAST EXCISIONAL BIOPSY Bilateral    MITRAL VALVE REPAIR Right 03/10/2018   Procedure: MINIMALLY INVASIVE MITRAL VALVE REPAIR (MVR):  -Triangular Resection of Posterior Leaflet P2; -Shortening Plasty of Posterior Leaflet P2; -Placement of Goretex Neo Chords x 6 using Chord-X system; -Ring Annuloplasty with a 30 mm Sorin Memo 4D Ring;  Surgeon: Rexene Alberts, MD;  Location: [place];  Service: Open Heart Surgery;  Laterality: Right;   RIGHT/LEFT HEART CATH AND CORONARY ANGIOGRAPHY N/A 01/18/2018   Procedure: RIGHT/LEFT HEART CATH AND CORONARY ANGIOGRAPHY;  Surgeon: Martinique, Peter M, MD;  Location: [place] [facility];  Service: Cardiovascular;  Laterality: N/A;   TEE WITHOUT CARDIOVERSION N/A 11/09/2017   Procedure: TRANSESOPHAGEAL ECHOCARDIOGRAM (TEE);  Surgeon: Larey Dresser, MD;  Location: [facility] ENDOSCOPY;  Service: Cardiovascular;  Laterality: N/A;   TEE WITHOUT CARDIOVERSION N/A 03/10/2018   Procedure: TRANSESOPHAGEAL ECHOCARDIOGRAM (TEE);  Surgeon: Rexene Alberts, MD;  Location: [place];  Service: Open Heart Surgery;  Laterality: N/A;    FAMILY HISTORY:  Family History  Problem Relation Age of Onset   Heart disease Father    Cancer Brother 62       ColoRectal Cancer  Cancer Maternal Grandmother 34   Stroke Maternal Grandfather 76   Stroke Paternal Grandfather     SOCIAL HISTORY:  Social History   Tobacco Use   Smoking status: Never   Smokeless tobacco: Never   Vaping Use   Vaping Use: Never used  Substance Use Topics   Alcohol use: No   Drug use: No    ALLERGIES:  Allergies  Allergen Reactions   Niaspan [Niacin Er] Other (See Comments)    Severe flushing   Phenazopyridine Hcl Other (See Comments)    Made sick    Statins     Severe flushing    MEDICATIONS:  Current Outpatient Medications  Medication Sig Dispense Refill   atorvastatin (LIPITOR) 20 MG tablet Take 1 tablet (20 mg total) by mouth daily. 90 tablet 3   Biotin 5000 MCG TABS Take 5,000 mcg by mouth daily.      Calcium Carb-Cholecalciferol (CALTRATE 600+D3) 600-20 MG-MCG TABS 2 tablet with a meal     Coenzyme Q10 (CO Q 10 PO) Take 800 mg by mouth daily.      diphenhydrAMINE HCl, Sleep, (UNISOM SLEEPGELS) 50 MG CAPS 1 capsule at bedtime as needed     Fiber Adult Gummies 2 g CHEW 2 tablets     levothyroxine (SYNTHROID) 25 MCG tablet Take 1 tablet (25 mcg total) by mouth daily before breakfast. 90 tablet 0   Melatonin 10-10 MG TBCR 2 tablet at bedtime as needed     Multiple Vitamin (MULTIVITAMIN) tablet Take 1 tablet by mouth daily. Centrum Silver     Polyethylene Glycol 400 0.25 % SOLN Apply to eye.     vitamin B-12 (CYANOCOBALAMIN) 1000 MCG tablet Take 1,000 mcg by mouth daily.     zinc gluconate 50 MG tablet Take 50 mg by mouth daily.     No current facility-administered medications for this encounter.    REVIEW OF SYSTEMS:  A 10+ POINT REVIEW OF SYSTEMS WAS OBTAINED including neurology, dermatology, psychiatry, cardiac, respiratory, lymph, extremities, GI, GU, musculoskeletal, constitutional, reproductive, HEENT.  Prior to biopsy the patient denies any pain within the left breast nipple discharge or bleeding.   PHYSICAL EXAM:  height is '5\' 2"'  (1.575 m) and weight is 146 lb 6.4 oz (66.4 kg). Her temperature is 97.9 F (36.6 C). Her blood pressure is 115/69 and her pulse is 85. Her respiration is 20 and oxygen saturation is 100%.   General: Alert and oriented, in no acute  distress HEENT: Head is normocephalic. Extraocular movements are intact.  Neck: Neck is supple, no palpable cervical or supraclavicular lymphadenopathy. Heart: Regular in rate and rhythm with no murmurs, rubs, or gallops. Chest: Clear to auscultation bilaterally, with no rhonchi, wheezes, or rales. Abdomen: Soft, nontender, nondistended, with no rigidity or guarding. Extremities: No cyanosis or edema. Lymphatics: see Neck Exam Skin: No concerning lesions. Musculoskeletal: symmetric strength and muscle tone throughout. Neurologic: Cranial nerves II through XII are grossly intact. No obvious focalities. Speech is fluent. Coordination is intact. Psychiatric: Judgment and insight are intact. Affect is appropriate.  Right Breast: no palpable mass, nipple discharge or bleeding.  Scar noted from previous benign biopsy Left Breast: no palpable mass, nipple discharge or bleeding.  Biopsy site with Steri-Strips in place in the upper central breast.  No nipple discharge or bleeding.  Scar in the lateral aspect of the breast from prior benign lumpectomy  ECOG = 1  0 - Asymptomatic (Fully active, able to carry on all predisease activities without restriction)  1 -  Symptomatic but completely ambulatory (Restricted in physically strenuous activity but ambulatory and able to carry out work of a light or sedentary nature. For example, light housework, office work)  2 - Symptomatic, <50% in bed during the day (Ambulatory and capable of all self care but unable to carry out any work activities. Up and about more than 50% of waking hours)  3 - Symptomatic, >50% in bed, but not bedbound (Capable of only limited self-care, confined to bed or chair 50% or more of waking hours)  4 - Bedbound (Completely disabled. Cannot carry on any self-care. Totally confined to bed or chair)  5 - Death   Eustace Pen MM, Creech RH, Tormey DC, et al. [id number]). "Toxicity and response criteria of the [facility]".  [place] Oncol. 5 (6): 649-55  LABORATORY DATA:  Lab Results  Component Value Date   WBC 10.4 01/13/2021   HGB 14.7 01/13/2021   HCT 44.4 01/13/2021   MCV 96.3 01/13/2021   PLT 257 01/13/2021   NEUTROABS 8,112 (H) 01/13/2021   Lab Results  Component Value Date   NA 140 01/13/2021   K 4.3 01/13/2021   CL 104 01/13/2021   CO2 26 01/13/2021   GLUCOSE 97 01/13/2021   BUN 15 01/13/2021   CREATININE 1.14 (H) 01/13/2021   CALCIUM 9.5 01/13/2021      RADIOGRAPHY: MM LT BREAST BX W LOC DEV 1ST LESION IMAGE BX SPEC STEREO GUIDE  Addendum Date: 02/02/2022   ADDENDUM REPORT: 02/02/2022 08:59 ADDENDUM: Pathology revealed GRADE II DUCTAL CARCINOMA IN SITU, SOLID TYPE WITH COMEDONECROSIS AND ASSOCIATED CALCIFICATIONS of the LEFT breast, upper central, (coil clip). This was found to be concordant by Dr. Nolon Nations. Pathology results were discussed with the patient by telephone. The patient reported doing well after the biopsy with minimal tenderness at the site. Post biopsy instructions and care were reviewed and questions were answered. The patient was encouraged to call [facility] for any additional concerns. My direct phone number was provided. Surgical consultation has been arranged with Dr. Autumn Messing at [facility] on February 04, 2022. Pathology results reported by Terie Purser, RN on 01/29/2022. Electronically Signed   By: Nolon Nations M.D.   On: 02/02/2022 08:59   Result Date: 02/02/2022 CLINICAL DATA:  Patient presents for stereotactic biopsy of calcifications in the LEFT breast. EXAM: LEFT BREAST STEREOTACTIC CORE NEEDLE BIOPSY COMPARISON:  Previous exam(s). FINDINGS: The patient and I discussed the procedure of stereotactic-guided biopsy including benefits and alternatives. We discussed the high likelihood of a successful procedure. We discussed the risks of the procedure including infection, bleeding, tissue injury, clip migration, and  inadequate sampling. Informed written consent was given. The usual time out protocol was performed immediately prior to the procedure. Using sterile technique and lidocaine and lidocaine with epinephrine as local anesthetic, under stereotactic guidance, a 9 gauge vacuum assisted device was used to perform core needle biopsy of calcifications in the UPPER central LEFT breast using a craniocaudal approach. Specimen radiograph was performed showing calcifications in numerous tissue samples. Specimens with calcifications are identified for pathology. Lesion quadrant: UPPER central LEFT breast At the conclusion of the procedure, coil shaped tissue marker clip was deployed into the biopsy cavity. Follow-up 2-view mammogram was performed and dictated separately. IMPRESSION: Stereotactic-guided biopsy of LEFT breast calcifications. No apparent complications. Electronically Signed: By: Nolon Nations M.D. On: 01/28/2022 09:49  MM CLIP PLACEMENT LEFT  Result Date: 01/28/2022 CLINICAL DATA:  Status post  stereotactic biopsy of LEFT breast calcifications. EXAM: 3D DIAGNOSTIC LEFT MAMMOGRAM POST STEREOTACTIC BIOPSY COMPARISON:  Previous exam(s). FINDINGS: 3D Mammographic images were obtained following stereotactic guided biopsy of calcifications in the UPPER central LEFT breast and placement of a coil shaped. The biopsy marking clip is 4 millimeters MEDIAL and inferior to the residual calcifications. An X shaped clip marks the site of remote benign biopsy. IMPRESSION: Coil shaped clip is 4 millimeters MEDIAL and inferior to the residual calcifications. Final Assessment: Post Procedure Mammograms for Marker Placement Electronically Signed   By: Nolon Nations M.D.   On: 01/28/2022 09:59     IMPRESSION: Stage 0 (cTis (DCIS), cN0, cM0) Left Breast, Intermediate to high-grade DCIS, ER+ / PR+ / Her2 not assessed  She would appear to be a good candidate for breast conserving therapy.  She does wish to proceed with this  treatment approach and is scheduled for surgery later this week.  We discussed the potential benefit of adjuvant radiation therapy in this situation.  We discussed the potential benefit of adjuvant hormonal therapy and how that can influence decisions concerning radiation therapy.  She was given a copy of her path report for review and I highlighted the details of this path report.  We discussed the general course of radiation therapy and discussed that she would be a candidate for hypofractionated accelerated radiation therapy.  We discussed the potential side effects during this treatment as well as potential long-term effects of radiation therapy.  She appears interested in proceeding with radiation therapy but will make the final decision after her surgery.  She will also be meeting with medical oncology in early October for discussion of adjuvant hormonal therapy.   PLAN: She will be seen approximately 4 weeks out from her surgery.  We will schedule the CT simulation on the same day in the event that she does wish to proceed with radiation therapy as a component of her postoperative treatment.   60 minutes of total time was spent for this patient encounter, including preparation, face-to-face counseling with the patient and coordination of care, physical exam, and documentation of the encounter.   ------------------------------------------------  Blair Promise, PhD, MD  This document serves as a record of services personally performed by Gery Pray, MD. It was created on his behalf by Roney Mans, a trained medical scribe. The creation of this record is based on the scribe's personal observations and the provider's statements to them. This document has been checked and approved by the attending provider.

## 2022-02-16 ENCOUNTER — Ambulatory Visit
Admission: RE | Admit: 2022-02-16 | Discharge: 2022-02-16 | Disposition: A | Discharge status: Home / Self Care | Payer: Medicare HMO | Source: Ambulatory Visit | Attending: Radiation Oncology | Admitting: Radiation Oncology

## 2022-02-16 ENCOUNTER — Other Ambulatory Visit: Payer: Self-pay

## 2022-02-16 ENCOUNTER — Other Ambulatory Visit: Payer: Self-pay | Admitting: Family Medicine

## 2022-02-16 ENCOUNTER — Encounter: Payer: Self-pay | Admitting: Radiation Oncology

## 2022-02-16 VITALS — BP 115/69 | HR 85 | Temp 97.9°F | Resp 20 | Ht 62.0 in | Wt 146.4 lb

## 2022-02-16 DIAGNOSIS — D0512 Intraductal carcinoma in situ of left breast: Secondary | ICD-10-CM

## 2022-02-16 DIAGNOSIS — Z170 Estrogen receptor positive status [ER+]: Secondary | ICD-10-CM | POA: Diagnosis not present

## 2022-02-16 MED ORDER — LEVOTHYROXINE SODIUM 25 MCG PO TABS: 25.0000 ug | ORAL_TABLET | Freq: Every day | ORAL | 0 refills | Status: DC

## 2022-02-16 NOTE — Telephone Encounter (Signed)
Patient called to check on status of refill request for levothyroxine (SYNTHROID) 25 MCG tablet [[id number]] ; also wants to know if an appt is needed before refill will be given. Patient stated she's having sx on Wednesday and wants to prevent a lapse in doses.  As per my conversation with Dr. Mamie Nick, he will send in the refill without requiring an appt.  Pharmacy confirmed as:  [id number] [address] [place] Phone:  [phone] Fax:  [phone] DEA #:  [id number] LOV: 11-10-21  Please advise at [phone]

## 2022-02-16 NOTE — Telephone Encounter (Signed)
Refilled 02/16/2022. Requested Prescriptions  Pending Prescriptions Disp Refills  . levothyroxine (SYNTHROID) 25 MCG tablet [Pharmacy Med Name: LEVOTHYROXINE 25 MCG TABLET] 90 tablet 0    Sig: TAKE 1 TABLET BY MOUTH EVERY DAY BEFORE BREAKFAST     Endocrinology:  Hypothyroid Agents Failed - 02/16/2022  9:22 AM      Failed - TSH in normal range and within 360 days    TSH  Date Value Ref Range Status  01/13/2021 4.18 0.40 - 4.50 mIU/L Final         Failed - Valid encounter within last 12 months    Recent Outpatient Visits          1 year ago Encounter for Medicare annual wellness exam   [place] Susy Frizzle, MD   2 years ago Hypothyroidism, unspecified type   [place] Susy Frizzle, MD   2 years ago Hypothyroidism, unspecified type   [place] Susy Frizzle, MD   2 years ago Chronic constipation   [place] Dennard Schaumann Cammie Mcgee, MD   3 years ago Thyroid nodule, cold   [place] Pickard, Cammie Mcgee, MD      Future Appointments            In 3 weeks Pickard, Cammie Mcgee, MD [place]

## 2022-02-17 ENCOUNTER — Ambulatory Visit
Admission: RE | Admit: 2022-02-17 | Discharge: 2022-02-17 | Disposition: A | Discharge status: Home / Self Care | Payer: Medicare HMO | Source: Ambulatory Visit | Attending: General Surgery | Admitting: General Surgery

## 2022-02-17 DIAGNOSIS — D0512 Intraductal carcinoma in situ of left breast: Secondary | ICD-10-CM

## 2022-02-17 NOTE — Progress Notes (Signed)

## 2022-02-18 ENCOUNTER — Encounter (HOSPITAL_BASED_OUTPATIENT_CLINIC_OR_DEPARTMENT_OTHER): Payer: Self-pay | Admitting: General Surgery

## 2022-02-18 ENCOUNTER — Ambulatory Visit (HOSPITAL_BASED_OUTPATIENT_CLINIC_OR_DEPARTMENT_OTHER)
Admission: RE | Admit: 2022-02-18 | Discharge: 2022-02-18 | Disposition: A | Discharge status: Home / Self Care | Payer: Medicare HMO | Source: Ambulatory Visit | Attending: General Surgery | Admitting: General Surgery

## 2022-02-18 ENCOUNTER — Other Ambulatory Visit: Payer: Self-pay

## 2022-02-18 ENCOUNTER — Ambulatory Visit (HOSPITAL_BASED_OUTPATIENT_CLINIC_OR_DEPARTMENT_OTHER): Payer: Medicare HMO | Admitting: Certified Registered"

## 2022-02-18 ENCOUNTER — Encounter (HOSPITAL_BASED_OUTPATIENT_CLINIC_OR_DEPARTMENT_OTHER)
Admission: RE | Disposition: A | Discharge status: Home / Self Care | Payer: Self-pay | Source: Ambulatory Visit | Attending: General Surgery

## 2022-02-18 ENCOUNTER — Ambulatory Visit
Admission: RE | Admit: 2022-02-18 | Discharge: 2022-02-18 | Disposition: A | Discharge status: Home / Self Care | Payer: Medicare HMO | Source: Ambulatory Visit | Attending: General Surgery | Admitting: General Surgery

## 2022-02-18 DIAGNOSIS — N62 Hypertrophy of breast: Secondary | ICD-10-CM | POA: Diagnosis not present

## 2022-02-18 DIAGNOSIS — D0512 Intraductal carcinoma in situ of left breast: Secondary | ICD-10-CM | POA: Insufficient documentation

## 2022-02-18 DIAGNOSIS — Z170 Estrogen receptor positive status [ER+]: Secondary | ICD-10-CM | POA: Diagnosis not present

## 2022-02-18 DIAGNOSIS — N641 Fat necrosis of breast: Secondary | ICD-10-CM | POA: Diagnosis not present

## 2022-02-18 DIAGNOSIS — F418 Other specified anxiety disorders: Secondary | ICD-10-CM | POA: Insufficient documentation

## 2022-02-18 DIAGNOSIS — R69 Illness, unspecified: Secondary | ICD-10-CM | POA: Diagnosis not present

## 2022-02-18 DIAGNOSIS — N6012 Diffuse cystic mastopathy of left breast: Secondary | ICD-10-CM | POA: Diagnosis not present

## 2022-02-18 DIAGNOSIS — E039 Hypothyroidism, unspecified: Secondary | ICD-10-CM | POA: Diagnosis not present

## 2022-02-18 DIAGNOSIS — R920 Mammographic microcalcification found on diagnostic imaging of breast: Secondary | ICD-10-CM | POA: Diagnosis not present

## 2022-02-18 DIAGNOSIS — Z01818 Encounter for other preprocedural examination: Secondary | ICD-10-CM

## 2022-02-18 DIAGNOSIS — R928 Other abnormal and inconclusive findings on diagnostic imaging of breast: Secondary | ICD-10-CM | POA: Diagnosis not present

## 2022-02-18 HISTORY — PX: BREAST LUMPECTOMY WITH RADIOACTIVE SEED LOCALIZATION: SHX6424

## 2022-02-18 HISTORY — PX: BREAST LUMPECTOMY: SHX2

## 2022-02-18 SURGERY — BREAST LUMPECTOMY WITH RADIOACTIVE SEED LOCALIZATION
Anesthesia: General | Site: Breast | Laterality: Left

## 2022-02-18 MED ORDER — GABAPENTIN 100 MG PO CAPS
100.0000 mg | ORAL_CAPSULE | ORAL | Status: AC
  Administered 2022-02-18: 100 mg via ORAL

## 2022-02-18 MED ORDER — EPHEDRINE 5 MG/ML INJ
INTRAVENOUS | Status: AC
  Filled 2022-02-18: qty 15

## 2022-02-18 MED ORDER — LIDOCAINE 2% (20 MG/ML) 5 ML SYRINGE
INTRAMUSCULAR | Status: AC
  Filled 2022-02-18: qty 5

## 2022-02-18 MED ORDER — FENTANYL CITRATE (PF) 100 MCG/2ML IJ SOLN
INTRAMUSCULAR | Status: DC | PRN
  Administered 2022-02-18: 25 ug via INTRAVENOUS

## 2022-02-18 MED ORDER — EPHEDRINE SULFATE (PRESSORS) 50 MG/ML IJ SOLN
INTRAMUSCULAR | Status: DC | PRN
  Administered 2022-02-18: 10 mg via INTRAVENOUS
  Administered 2022-02-18 (×3): 5 mg via INTRAVENOUS

## 2022-02-18 MED ORDER — CELECOXIB 100 MG PO CAPS
ORAL_CAPSULE | ORAL | Status: AC
  Filled 2022-02-18: qty 1

## 2022-02-18 MED ORDER — DEXAMETHASONE SODIUM PHOSPHATE 10 MG/ML IJ SOLN
INTRAMUSCULAR | Status: AC
  Filled 2022-02-18: qty 1

## 2022-02-18 MED ORDER — LACTATED RINGERS IV SOLN: INTRAVENOUS | Status: DC

## 2022-02-18 MED ORDER — CELECOXIB 100 MG PO CAPS
100.0000 mg | ORAL_CAPSULE | ORAL | Status: AC
  Administered 2022-02-18: 100 mg via ORAL

## 2022-02-18 MED ORDER — CEFAZOLIN SODIUM-DEXTROSE 2-4 GM/100ML-% IV SOLN
2.0000 g | INTRAVENOUS | Status: AC
  Administered 2022-02-18: 2 g via INTRAVENOUS

## 2022-02-18 MED ORDER — GABAPENTIN 100 MG PO CAPS
ORAL_CAPSULE | ORAL | Status: AC
  Filled 2022-02-18: qty 1

## 2022-02-18 MED ORDER — BUPIVACAINE-EPINEPHRINE (PF) 0.25% -1:200000 IJ SOLN
INTRAMUSCULAR | Status: DC | PRN
  Administered 2022-02-18: 18 mL

## 2022-02-18 MED ORDER — CEFAZOLIN SODIUM-DEXTROSE 2-4 GM/100ML-% IV SOLN
INTRAVENOUS | Status: AC
  Filled 2022-02-18: qty 100

## 2022-02-18 MED ORDER — FENTANYL CITRATE (PF) 100 MCG/2ML IJ SOLN
INTRAMUSCULAR | Status: AC
  Filled 2022-02-18: qty 2

## 2022-02-18 MED ORDER — CHLORHEXIDINE GLUCONATE CLOTH 2 % EX PADS: 6.0000 | MEDICATED_PAD | Freq: Once | CUTANEOUS | Status: DC

## 2022-02-18 MED ORDER — PHENYLEPHRINE HCL (PRESSORS) 10 MG/ML IV SOLN
INTRAVENOUS | Status: DC | PRN
  Administered 2022-02-18: 160 ug via INTRAVENOUS
  Administered 2022-02-18: 80 ug via INTRAVENOUS
  Administered 2022-02-18: 160 ug via INTRAVENOUS
  Administered 2022-02-18: 80 ug via INTRAVENOUS
  Administered 2022-02-18: 160 ug via INTRAVENOUS

## 2022-02-18 MED ORDER — OXYCODONE HCL 5 MG PO TABS: 5.0000 mg | ORAL_TABLET | Freq: Four times a day (QID) | ORAL | 0 refills | Status: DC | PRN

## 2022-02-18 MED ORDER — FENTANYL CITRATE (PF) 100 MCG/2ML IJ SOLN: 25.0000 ug | INTRAMUSCULAR | Status: DC | PRN

## 2022-02-18 MED ORDER — LIDOCAINE HCL (CARDIAC) PF 100 MG/5ML IV SOSY
PREFILLED_SYRINGE | INTRAVENOUS | Status: DC | PRN
  Administered 2022-02-18: 60 mg via INTRAVENOUS

## 2022-02-18 MED ORDER — ACETAMINOPHEN 500 MG PO TABS
1000.0000 mg | ORAL_TABLET | ORAL | Status: AC
  Administered 2022-02-18: 1000 mg via ORAL

## 2022-02-18 MED ORDER — ACETAMINOPHEN 500 MG PO TABS
ORAL_TABLET | ORAL | Status: AC
  Filled 2022-02-18: qty 2

## 2022-02-18 MED ORDER — PROPOFOL 10 MG/ML IV BOLUS
INTRAVENOUS | Status: AC
  Filled 2022-02-18: qty 20

## 2022-02-18 MED ORDER — ONDANSETRON HCL 4 MG/2ML IJ SOLN: 4.0000 mg | Freq: Once | INTRAMUSCULAR | Status: DC | PRN

## 2022-02-18 MED ORDER — PROPOFOL 10 MG/ML IV BOLUS
INTRAVENOUS | Status: DC | PRN
  Administered 2022-02-18: 140 mg via INTRAVENOUS

## 2022-02-18 MED ORDER — DEXAMETHASONE SODIUM PHOSPHATE 10 MG/ML IJ SOLN
INTRAMUSCULAR | Status: DC | PRN
  Administered 2022-02-18: 10 mg via INTRAVENOUS

## 2022-02-18 MED ORDER — ONDANSETRON HCL 4 MG/2ML IJ SOLN
INTRAMUSCULAR | Status: AC
  Filled 2022-02-18: qty 2

## 2022-02-18 MED ORDER — ONDANSETRON HCL 4 MG/2ML IJ SOLN
INTRAMUSCULAR | Status: DC | PRN
  Administered 2022-02-18: 4 mg via INTRAVENOUS

## 2022-02-18 SURGICAL SUPPLY — 42 items
ADH SKN CLS APL DERMABOND .7 (GAUZE/BANDAGES/DRESSINGS) ×1
APL PRP STRL LF DISP 70% ISPRP (MISCELLANEOUS) ×1
APPLIER CLIP 9.375 MED OPEN (MISCELLANEOUS) ×1
APR CLP MED 9.3 20 MLT OPN (MISCELLANEOUS) ×1
BLADE SURG 15 STRL LF DISP TIS (BLADE) ×1 IMPLANT
BLADE SURG 15 STRL SS (BLADE) ×1
CANISTER SUC SOCK COL 7IN (MISCELLANEOUS) ×1 IMPLANT
CANISTER SUCT 1200ML W/VALVE (MISCELLANEOUS) ×1 IMPLANT
CHLORAPREP W/TINT 26 (MISCELLANEOUS) ×1 IMPLANT
CLIP APPLIE 9.375 MED OPEN (MISCELLANEOUS) IMPLANT
COVER BACK TABLE 60X90IN (DRAPES) ×1 IMPLANT
COVER MAYO STAND STRL (DRAPES) ×1 IMPLANT
COVER PROBE W GEL 5X96 (DRAPES) ×1 IMPLANT
DERMABOND ADVANCED .7 DNX12 (GAUZE/BANDAGES/DRESSINGS) ×1 IMPLANT
DRAPE LAPAROSCOPIC ABDOMINAL (DRAPES) ×1 IMPLANT
DRAPE LAPAROTOMY 100X72 PEDS (DRAPES) IMPLANT
DRAPE UTILITY XL STRL (DRAPES) ×1 IMPLANT
ELECT COATED BLADE 2.86 ST (ELECTRODE) ×1 IMPLANT
ELECT REM PT RETURN 9FT ADLT (ELECTROSURGICAL) ×1
ELECTRODE REM PT RTRN 9FT ADLT (ELECTROSURGICAL) ×1 IMPLANT
GLOVE BIO SURGEON STRL SZ7.5 (GLOVE) ×2 IMPLANT
GOWN STRL REUS W/ TWL LRG LVL3 (GOWN DISPOSABLE) ×2 IMPLANT
GOWN STRL REUS W/TWL LRG LVL3 (GOWN DISPOSABLE) ×3
ILLUMINATOR WAVEGUIDE N/F (MISCELLANEOUS) IMPLANT
KIT MARKER MARGIN INK (KITS) ×1 IMPLANT
LIGHT WAVEGUIDE WIDE FLAT (MISCELLANEOUS) IMPLANT
NDL HYPO 25X1 1.5 SAFETY (NEEDLE) IMPLANT
NEEDLE HYPO 25X1 1.5 SAFETY (NEEDLE) ×1 IMPLANT
NS IRRIG 1000ML POUR BTL (IV SOLUTION) IMPLANT
PACK BASIN DAY SURGERY FS (CUSTOM PROCEDURE TRAY) ×1 IMPLANT
PENCIL SMOKE EVACUATOR (MISCELLANEOUS) ×1 IMPLANT
SLEEVE SCD COMPRESS KNEE MED (STOCKING) ×1 IMPLANT
SPIKE FLUID TRANSFER (MISCELLANEOUS) IMPLANT
SPONGE T-LAP 18X18 ~~LOC~~+RFID (SPONGE) ×1 IMPLANT
SUT MON AB 4-0 PC3 18 (SUTURE) ×1 IMPLANT
SUT SILK 2 0 SH (SUTURE) IMPLANT
SUT VICRYL 3-0 CR8 SH (SUTURE) ×1 IMPLANT
SYR CONTROL 10ML LL (SYRINGE) IMPLANT
TOWEL GREEN STERILE FF (TOWEL DISPOSABLE) ×1 IMPLANT
TRAY FAXITRON CT DISP (TRAY / TRAY PROCEDURE) ×1 IMPLANT
TUBE CONNECTING 20X1/4 (TUBING) ×1 IMPLANT
YANKAUER SUCT BULB TIP NO VENT (SUCTIONS) IMPLANT

## 2022-02-18 NOTE — Discharge Instructions (Signed)

## 2022-02-18 NOTE — Anesthesia Postprocedure Evaluation (Signed)
Anesthesia Post Note  Patient: Maria Gomez  Procedure(s) Performed: LEFT BREAST LUMPECTOMY WITH RADIOACTIVE SEED LOCALIZATION (Left: Breast)     Patient location during evaluation: PACU Anesthesia Type: General Level of consciousness: awake and alert Pain management: pain level controlled Vital Signs Assessment: post-procedure vital signs reviewed and stable Respiratory status: spontaneous breathing, nonlabored ventilation, respiratory function stable and patient connected to nasal cannula oxygen Cardiovascular status: blood pressure returned to baseline and stable Postop Assessment: no apparent nausea or vomiting Anesthetic complications: no   No notable events documented.  Last Vitals:  Vitals:   02/18/22 1445 02/18/22 1525  BP: 115/60 119/70  Pulse: 80 73  Resp: 16 16  Temp:  (!) 36.3 C  SpO2: 94% 94%    Last Pain:  Vitals:   02/18/22 1525  TempSrc:   PainSc: 0-No pain                 Santa Lighter

## 2022-02-18 NOTE — Anesthesia Procedure Notes (Signed)
Procedure Name: LMA Insertion Date/Time: 02/18/2022 1:32 PM  Performed by: Lavonia Dana, CRNAPre-anesthesia Checklist: Patient identified, Emergency Drugs available, Suction available and Patient being monitored Patient Re-evaluated:Patient Re-evaluated prior to induction Oxygen Delivery Method: Circle system utilized Preoxygenation: Pre-oxygenation with 100% oxygen Induction Type: IV induction Ventilation: Mask ventilation without difficulty LMA: LMA inserted LMA Size: 4.0 Number of attempts: 1 Airway Equipment and Method: Bite block Placement Confirmation: positive ETCO2 Tube secured with: Tape Dental Injury: Teeth and Oropharynx as per pre-operative assessment

## 2022-02-18 NOTE — Anesthesia Preprocedure Evaluation (Addendum)
Anesthesia Evaluation  Patient identified by MRN, date of birth, ID band Patient awake    Reviewed: Allergy & Precautions, NPO status , Patient's Chart, lab work & pertinent test results  History of Anesthesia Complications Negative for: history of anesthetic complications  Airway Mallampati: II  TM Distance: >3 FB Neck ROM: Full    Dental  (+) Teeth Intact, Dental Advisory Given, Caps   Pulmonary neg pulmonary ROS,    Pulmonary exam normal breath sounds clear to auscultation       Cardiovascular (-) anginaNormal cardiovascular exam+ Valvular Problems/Murmurs (s/p Mini-MVR) MR  Rhythm:Regular Rate:Normal     Neuro/Psych PSYCHIATRIC DISORDERS Anxiety Depression negative neurological ROS     GI/Hepatic negative GI ROS, Neg liver ROS,   Endo/Other  Hypothyroidism   Renal/GU negative Renal ROS     Musculoskeletal negative musculoskeletal ROS (+)   Abdominal   Peds  Hematology negative hematology ROS (+)   Anesthesia Other Findings Day of surgery medications reviewed with the patient.  Left breast DCIS   Reproductive/Obstetrics                            Anesthesia Physical Anesthesia Plan  ASA: 2  Anesthesia Plan: General   Post-op Pain Management: Tylenol PO (pre-op)*, Celebrex PO (pre-op)* and Gabapentin PO (pre-op)*   Induction: Intravenous  PONV Risk Score and Plan: 3 and Dexamethasone, Ondansetron and Treatment may vary due to age or medical condition  Airway Management Planned: LMA  Additional Equipment:   Intra-op Plan:   Post-operative Plan: Extubation in OR  Informed Consent: I have reviewed the patients History and Physical, chart, labs and discussed the procedure including the risks, benefits and alternatives for the proposed anesthesia with the patient or authorized representative who has indicated his/her understanding and acceptance.     Dental advisory  given  Plan Discussed with: CRNA  Anesthesia Plan Comments:         Anesthesia Quick Evaluation

## 2022-02-18 NOTE — Op Note (Signed)
02/18/2022  2:17 PM  PATIENT:  Maria Gomez  76 y.o. female  PRE-OPERATIVE DIAGNOSIS:  LEFT BREAST DUCTAL CARCINOMA IN SITU  POST-OPERATIVE DIAGNOSIS:  LEFT BREAST DUCTAL CARCINOMA IN SITU  PROCEDURE:  Procedure(s): LEFT BREAST LUMPECTOMY WITH RADIOACTIVE SEED LOCALIZATION (Left)  SURGEON:  Surgeon(s) and Role:    * Jovita Kussmaul, MD - Primary    * Maczis, Carlena Hurl, PA-C - Assisting  PHYSICIAN ASSISTANT:   ASSISTANTS: Puja Maczis, PA   ANESTHESIA:   local and general  EBL:  10 mL   BLOOD ADMINISTERED:none  DRAINS: none   LOCAL MEDICATIONS USED:  MARCAINE     SPECIMEN:  Source of Specimen:  left breast tissue with additional deep margin  DISPOSITION OF SPECIMEN:  PATHOLOGY  COUNTS:  YES  TOURNIQUET:  * No tourniquets in log *  DICTATION: .Dragon Dictation  After informed consent was obtained the patient was brought to the operating room and placed in the supine position on the operating table.  After adequate induction of general anesthesia the patient's left breast was prepped with ChloraPrep, allowed to dry, and draped in usual sterile manner.  An appropriate timeout was performed.  Previously an I-125 seed was placed in the upper central portion of the left breast to mark an area of ductal carcinoma in situ.  The neoprobe was set to I-125 in the area of radioactivity was readily identified.  The area around this was infiltrated with quarter percent Marcaine.  A curvilinear incision was then made along the upper edge of the areola of the left breast with a 15 blade knife.  The incision was carried through the skin and subcutaneous tissue sharply with the electrocautery.  Dissection was then carried out between the breast tissue and the subcutaneous fat and skin throughout the upper portion of the breast until the dissection was beyond the area of the cancer.  Next a circular portion of breast tissue was then excised sharply with the electrocautery around the  radioactive seed while checking the area of radioactivity frequently.  Once the specimen was removed from the patient it was oriented with the appropriate paint colors.  A specimen radiograph was obtained that showed the clip and seed to be near the center of the specimen.  The specimen was then sent to pathology for further evaluation.  I did elect to take an additional deep margin and this was also marked appropriately and sent to pathology.  Hemostasis was achieved using the Bovie electrocautery.  The wound was irrigated with saline and infiltrated with more quarter percent Marcaine.  The cavity was marked with clips.  The deep layer of the incision was then closed with interrupted 3-0 Vicryl stitches.  The skin was then closed with interrupted 4-0 Monocryl subcuticular stitches.  Dermabond dressings were applied.  The patient tolerated the procedure well.  At the end of the case all needle sponge and instrument counts were correct.  The patient was then awakened and taken to recovery in stable condition.  PLAN OF CARE: Discharge to home after PACU  PATIENT DISPOSITION:  PACU - hemodynamically stable.   Delay start of Pharmacological VTE agent (>24hrs) due to surgical blood loss or risk of bleeding: not applicable

## 2022-02-18 NOTE — Interval H&P Note (Signed)
History and Physical Interval Note:  02/18/2022 1:12 PM  Maria Gomez  has presented today for surgery, with the diagnosis of LEFT BREAST DCIS.  The various methods of treatment have been discussed with the patient and family. After consideration of risks, benefits and other options for treatment, the patient has consented to  Procedure(s): LEFT BREAST LUMPECTOMY WITH RADIOACTIVE SEED LOCALIZATION (Left) as a surgical intervention.  The patient's history has been reviewed, patient examined, no change in status, stable for surgery.  I have reviewed the patient's chart and labs.  Questions were answered to the patient's satisfaction.     Autumn Messing III

## 2022-02-18 NOTE — Transfer of Care (Signed)
Immediate Anesthesia Transfer of Care Note  Patient: Maria Gomez  Procedure(s) Performed: LEFT BREAST LUMPECTOMY WITH RADIOACTIVE SEED LOCALIZATION (Left: Breast)  Patient Location: PACU  Anesthesia Type:General  Level of Consciousness: drowsy  Airway & Oxygen Therapy: Patient Spontanous Breathing and Patient connected to face mask oxygen  Post-op Assessment: Report given to RN and Post -op Vital signs reviewed and stable  Post vital signs: Reviewed and stable  Last Vitals:   BP: 122/63 (80) Vitals Value Taken Time  BP    Temp    Pulse 79 02/18/22 1424  Resp 14 02/18/22 1424  SpO2 100 % 02/18/22 1424  Vitals shown include unvalidated device data.  Last Pain:  Vitals:   02/18/22 1204  TempSrc: Oral  PainSc: 0-No pain         Complications: No notable events documented.

## 2022-02-18 NOTE — H&P (Signed)
REFERRING PHYSICIAN: Mabeline Caras*  PROVIDER: Landry Corporal, MD  MRN: [id number] DOB: Jul 26, 1945 Subjective   Chief Complaint: No chief complaint on file.   History of Present Illness: Maria Gomez is a 76 y.o. female who is seen today as an office consultation for evaluation of No chief complaint on file. .   We are asked to see the patient in consultation by Dr. Jenna Luo to evaluate her for a new left breast cancer. The patient is a 76 year old white female who recently went for a routine screening mammogram. At that time she was found to have a 3 mm area of calcification in the upper portion of the left breast. The calcifications were biopsied and came back as ductal carcinoma in situ that was ER and PR positive. She does have a family history of breast cancer in a maternal grandmother. She is otherwise in pretty good health and does not smoke.  Review of Systems: A complete review of systems was obtained from the patient. I have reviewed this information and discussed as appropriate with the patient. See HPI as well for other ROS.  ROS   Medical History: History reviewed. No pertinent past medical history.  Patient Active Problem List  Diagnosis  Ductal carcinoma in situ (DCIS) of left breast   History reviewed. No pertinent surgical history.   Not on File  No current outpatient medications on file prior to visit.   No current facility-administered medications on file prior to visit.   History reviewed. No pertinent family history.   Social History   Tobacco Use  Smoking Status Not on file  Smokeless Tobacco Not on file    Social History   Socioeconomic History  Marital status: Widowed   Objective:  There were no vitals filed for this visit.  There is no height or weight on file to calculate BMI.  Physical Exam Vitals reviewed.  Constitutional:  General: She is not in acute distress. Appearance: Normal appearance.  HENT:  Head:  Normocephalic and atraumatic.  Right Ear: External ear normal.  Left Ear: External ear normal.  Nose: Nose normal.  Mouth/Throat:  Mouth: Mucous membranes are moist.  Pharynx: Oropharynx is clear.  Eyes:  General: No scleral icterus. Extraocular Movements: Extraocular movements intact.  Conjunctiva/sclera: Conjunctivae normal.  Pupils: Pupils are equal, round, and reactive to light.  Cardiovascular:  Rate and Rhythm: Normal rate and regular rhythm.  Pulses: Normal pulses.  Heart sounds: Normal heart sounds.  Pulmonary:  Effort: Pulmonary effort is normal. No respiratory distress.  Breath sounds: Normal breath sounds.  Abdominal:  General: Bowel sounds are normal.  Palpations: Abdomen is soft.  Tenderness: There is no abdominal tenderness.  Musculoskeletal:  General: No swelling, tenderness or deformity. Normal range of motion.  Cervical back: Normal range of motion and neck supple.  Skin: General: Skin is warm and dry.  Coloration: Skin is not jaundiced.  Neurological:  General: No focal deficit present.  Mental Status: She is alert and oriented to person, place, and time.  Psychiatric:  Mood and Affect: Mood normal.  Behavior: Behavior normal.    Breast: There is a small palpable bruise in the upper portion of the left breast. Other than this there is no other palpable mass in either breast. There is no palpable axillary, supraclavicular, or cervical lymphadenopathy.  Labs, Imaging and Diagnostic Testing:  Assessment and Plan:   Diagnoses and all orders for this visit:  Ductal carcinoma in situ (DCIS) of left breast - Ambulatory Referral  to Oncology-Medical - Ambulatory Referral to Radiation Oncology - [facility] Case Posting Request; Future    The patient appears to have a 3 mm area of ductal carcinoma in situ in the upper portion of the left breast. I have discussed with her in detail the different options for treatment and at this point she favors breast conservation  which I feel is very reasonable. She will not need a node evaluation. I have discussed with her in detail the risks and benefits of the operation as well as some of the technical aspects including the use of a radioactive seed for localization and she understands and wishes to proceed. We will go ahead and refer her to medical and radiation oncology to discuss adjuvant therapy. We will move forward with surgical planning.

## 2022-02-19 ENCOUNTER — Encounter (HOSPITAL_BASED_OUTPATIENT_CLINIC_OR_DEPARTMENT_OTHER): Payer: Self-pay | Admitting: General Surgery

## 2022-02-19 NOTE — Progress Notes (Signed)
Left message stating courtesy call and if any questions or concerns please call the doctors office.  

## 2022-02-20 LAB — SURGICAL PATHOLOGY

## 2022-02-25 ENCOUNTER — Other Ambulatory Visit: Payer: Self-pay | Admitting: *Deleted

## 2022-02-25 DIAGNOSIS — D0512 Intraductal carcinoma in situ of left breast: Secondary | ICD-10-CM

## 2022-02-26 ENCOUNTER — Inpatient Hospital Stay: Payer: Medicare HMO

## 2022-02-26 ENCOUNTER — Encounter: Payer: Self-pay | Admitting: Hematology and Oncology

## 2022-02-26 ENCOUNTER — Encounter: Payer: Self-pay | Admitting: *Deleted

## 2022-02-26 ENCOUNTER — Other Ambulatory Visit: Payer: Self-pay

## 2022-02-26 ENCOUNTER — Inpatient Hospital Stay: Payer: Medicare HMO | Attending: Hematology and Oncology | Admitting: Hematology and Oncology

## 2022-02-26 DIAGNOSIS — E039 Hypothyroidism, unspecified: Secondary | ICD-10-CM | POA: Insufficient documentation

## 2022-02-26 DIAGNOSIS — Z79899 Other long term (current) drug therapy: Secondary | ICD-10-CM | POA: Diagnosis not present

## 2022-02-26 DIAGNOSIS — M8580 Other specified disorders of bone density and structure, unspecified site: Secondary | ICD-10-CM | POA: Insufficient documentation

## 2022-02-26 DIAGNOSIS — D0512 Intraductal carcinoma in situ of left breast: Secondary | ICD-10-CM | POA: Insufficient documentation

## 2022-02-26 NOTE — Assessment & Plan Note (Signed)
This is a very pleasant 76 year old female patient with newly diagnosed left breast DCIS, ER/PR positive status post left lumpectomy who is here for an initial visit. We have discussed the following details about DCIS and antiestrogen therapy.  Pathology review: I discussed with the patient the difference between DCIS and invasive breast cancer. It is considered a precancerous lesion. DCIS is classified as a Stage 0 breast cancer. It is generally detected through mammograms as calcifications. We discussed the significance of grades and its impact on prognosis. We also discussed the importance of ER and PR receptors and their implications to adjuvant treatment options. Prognosis of DCIS dependence on grade and degree of comedo necrosis. It is anticipated that if not treated, 20-30% of DCIS can develop into invasive breast cancer.  Recommendation: 1. Breast conserving surgery performed on 9/27 2. Followed by adjuvant radiation therapy 3. Followed by antiestrogen therapy with tamoxifen/aromatase inhibitors based on menopausal status 5 years  Tamoxifen counseling: We discussed the risks and benefits of tamoxifen. These include but not limited to insomnia, hot flashes, mood changes, vaginal dryness, and weight gain. Although rare, serious side effects including endometrial cancer, risk of blood clots were also discussed. We strongly believe that the benefits far outweigh the risks. Patient understands these risks and consented to starting treatment. Planned treatment duration is 5 years.  She has underlying diagnosis of osteoporosis, last bone density in 2021 and has an upcoming appointment for repeat bone density in December. I think she will be a good candidate for tamoxifen or aromatase inhibitors.  When she returns for follow-up, we will review her bone density and discuss once again about treatment options.  She is in agreement with this plan. Aromatase inhibitors counseling: We have discussed the  mechanism of action of aromatase inhibitors today.  We have discussed adverse effects including but not limited to menopausal symptoms, increased risk of osteoporosis and fractures, cardiovascular events, arthralgias and myalgias.  We do believe that the benefits far outweigh the risks.  Plan treatment duration of 5 years.

## 2022-02-26 NOTE — Progress Notes (Signed)
[facility] NOTE  Patient Care Team: Susy Frizzle, MD as PCP - General (Family Medicine) Martinique, Peter M, MD as PCP - Cardiology (Cardiology) Rockwell Germany, RN as Oncology Nurse Navigator Tressie Ellis, Paulette Blanch, RN as Oncology Nurse Navigator Benay Pike, MD as Consulting Physician (Hematology and Oncology) Gery Pray, MD as Consulting Physician (Radiation Oncology) Jovita Kussmaul, MD as Consulting Physician (General Surgery)  CHIEF COMPLAINTS/PURPOSE OF CONSULTATION:  Newly diagnosed breast cancer  HISTORY OF PRESENTING ILLNESS:  Maria Gomez 76 y.o. female is here because of recent diagnosis of left breast DCIS  I reviewed her records extensively and collaborated the history with the patient.  SUMMARY OF ONCOLOGIC HISTORY: Oncology History  Ductal carcinoma in situ (DCIS) of left breast  12/30/2021 Mammogram   Screening mammogram showed new calcifications in the left breast warranting further evaluation, no findings suspicious for malignancy in the right breast.  She then had a diagnostic mammogram as well as biopsy.   01/28/2022 Pathology Results   Left breast needle core biopsy showed DCIS, grade 2-3 out of 3, ER 100% positive strong staining PR 70% positive strong staining   02/16/2022 Initial Diagnosis   Ductal carcinoma in situ (DCIS) of left breast   02/18/2022 Definitive Surgery   Left breast lumpectomy on September 27 showed DCIS, intermediate to high nuclear grade, negative for invasive carcinoma, margins free of carcinoma fibrocystic changes.  Prognostics not repeated    She is here for an initial visit.  She is going to proceed with adjuvant radiation soon.  At baseline, she has mitral valve prolapse, needed valvuloplasty, has hypothyroidism, otherwise considers herself healthy.  She has 1 child, used hormone replacement therapy briefly when she underwent menopause almost 25 years ago.  She does report some family history of breast  cancer in maternal grandmother as well as maternal first cousin.  Maternal first cousin was older than her at the time of diagnosis and had bilateral mastectomy rest of the pertinent 10 point ROS reviewed and negative.  MEDICAL HISTORY:  Past Medical History:  Diagnosis Date   Cancer ([place]) 12/2021   left breast DCIS   Hypothyroidism    Mitral regurgitation    Osteopenia    S/P minimally invasive mitral valve repair 03/10/2018   Complex valvuloplasty including triangular resection of posterior leaflet, artificial Gore-tex neochord x6 and Size 30 Sorin Memo 4D Ring SN [id number] via right mini thoracotomy approach   Thyroid nodule 03/07/2018   Incidental - noted on CT scan, atypical cells found on fine-needle aspiration.  General surgery consultation and excision recommended February 2020    SURGICAL HISTORY: Past Surgical History:  Procedure Laterality Date   BREAST EXCISIONAL BIOPSY Bilateral    BREAST LUMPECTOMY WITH RADIOACTIVE SEED LOCALIZATION Left 02/18/2022   Procedure: LEFT BREAST LUMPECTOMY WITH RADIOACTIVE SEED LOCALIZATION;  Surgeon: Jovita Kussmaul, MD;  Location: [facility];  Service: General;  Laterality: Left;   MITRAL VALVE REPAIR Right 03/10/2018   Procedure: MINIMALLY INVASIVE MITRAL VALVE REPAIR (MVR):  -Triangular Resection of Posterior Leaflet P2; -Shortening Plasty of Posterior Leaflet P2; -Placement of Goretex Neo Chords x 6 using Chord-X system; -Ring Annuloplasty with a 30 mm Sorin Memo 4D Ring;  Surgeon: Rexene Alberts, MD;  Location: [facility];  Service: Open Heart Surgery;  Laterality: Right;   RIGHT/LEFT HEART CATH AND CORONARY ANGIOGRAPHY N/A 01/18/2018   Procedure: RIGHT/LEFT HEART CATH AND CORONARY ANGIOGRAPHY;  Surgeon: Martinique, Peter M, MD;  Location: [facility]  [facility];  Service: Cardiovascular;  Laterality: N/A;   TEE WITHOUT CARDIOVERSION N/A 11/09/2017   Procedure: TRANSESOPHAGEAL ECHOCARDIOGRAM (TEE);  Surgeon: Larey Dresser, MD;  [facility]  ENDOSCOPY;  Service: Cardiovascular;  Laterality: N/A;   TEE WITHOUT CARDIOVERSION N/A 03/10/2018   Procedure: TRANSESOPHAGEAL ECHOCARDIOGRAM (TEE);  Surgeon: Rexene Alberts, MD;  Location: [facility];  Service: Open Heart Surgery;  Laterality: N/A;    SOCIAL HISTORY: Social History   Socioeconomic History   Marital status: Widowed    Spouse name: Not on file   Number of children: Not on file   Years of education: Not on file   Highest education level: Not on file  Occupational History   Not on file  Tobacco Use   Smoking status: Never   Smokeless tobacco: Never  Vaping Use   Vaping Use: Never used  Substance and Sexual Activity   Alcohol use: No   Drug use: No   Sexual activity: Not Currently    Birth control/protection: Post-menopausal  Other Topics Concern   Not on file  Social History Narrative   Lives with her mom.    Takes care of aunt who lives across the street.    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         Social Determinants of Health   Financial Resource Strain: Low Risk  (11/06/2021)   Overall Financial Resource Strain (CARDIA)    Difficulty of Paying Living Expenses: Not hard at all  Food Insecurity: No Food Insecurity (11/06/2021)   Hunger Vital Sign    Worried About Running Out of Food in the Last Year: Never true    Ran Out of Food in the Last Year: Never true  Transportation Needs: No Transportation Needs (11/06/2021)   PRAPARE - Hydrologist (Medical): No    Lack of Transportation (Non-Medical): No  Physical Activity: Insufficiently Active (11/06/2021)   Exercise Vital Sign    Days of Exercise per Week: 5 days    Minutes of Exercise per Session: 20 min  Stress: No Stress Concern Present (11/06/2021)   [place]    Feeling of Stress : Not at all  Social Connections: Moderately Integrated (11/06/2021)   Social Connection and  Isolation Panel [NHANES]    Frequency of Communication with Friends and Family: More than three times a week    Frequency of Social Gatherings with Friends and Family: More than three times a week    Attends Religious Services: More than 4 times per year    Active Member of [employer] or Organizations: Yes    Attends Archivist Meetings: More than 4 times per year    Marital Status: Widowed  Intimate Partner Violence: Not At Risk (02/16/2022)   Humiliation, Afraid, Rape, and Kick questionnaire    Fear of Current or Ex-Partner: No    Emotionally Abused: No    Physically Abused: No    Sexually Abused: No    FAMILY HISTORY: Family History  Problem Relation Age of Onset   Heart disease Father    Cancer Brother 34       ColoRectal Cancer   Cancer Maternal Grandmother 86   Stroke Maternal Grandfather 76   Stroke Paternal Grandfather     ALLERGIES:  is allergic to niaspan [niacin er], phenazopyridine hcl, and statins.  MEDICATIONS:  Current Outpatient Medications  Medication Sig Dispense Refill   atorvastatin (  LIPITOR) 20 MG tablet Take 1 tablet (20 mg total) by mouth daily. 90 tablet 3   Biotin 5000 MCG TABS Take 5,000 mcg by mouth daily.      Calcium Carb-Cholecalciferol (CALTRATE 600+D3) 600-20 MG-MCG TABS 2 tablet with a meal     Coenzyme Q10 (CO Q 10 PO) Take 800 mg by mouth daily.      diphenhydrAMINE HCl, Sleep, (UNISOM SLEEPGELS) 50 MG CAPS 1 capsule at bedtime as needed     Fiber Adult Gummies 2 g CHEW 2 tablets     levothyroxine (SYNTHROID) 25 MCG tablet Take 1 tablet (25 mcg total) by mouth daily before breakfast. 90 tablet 0   Melatonin 10-10 MG TBCR 2 tablet at bedtime as needed     Multiple Vitamin (MULTIVITAMIN) tablet Take 1 tablet by mouth daily. Centrum Silver     oxyCODONE (ROXICODONE) 5 MG immediate release tablet Take 1 tablet (5 mg total) by mouth every 6 (six) hours as needed for severe pain. 10 tablet 0   Polyethylene Glycol 400 0.25 % SOLN Apply to eye.      vitamin B-12 (CYANOCOBALAMIN) 1000 MCG tablet Take 1,000 mcg by mouth daily.     zinc gluconate 50 MG tablet Take 50 mg by mouth daily.     No current facility-administered medications for this visit.    REVIEW OF SYSTEMS:   Constitutional: Denies fevers, chills or abnormal night sweats Eyes: Denies blurriness of vision, double vision or watery eyes Ears, nose, mouth, throat, and face: Denies mucositis or sore throat Respiratory: Denies cough, dyspnea or wheezes Cardiovascular: Denies palpitation, chest discomfort or lower extremity swelling Gastrointestinal:  Denies nausea, heartburn or change in bowel habits Skin: Denies abnormal skin rashes Lymphatics: Denies new lymphadenopathy or easy bruising Neurological:Denies numbness, tingling or new weaknesses Behavioral/Psych: Mood is stable, no new changes  Breast: Denies any palpable lumps or discharge All other systems were reviewed with the patient and are negative.  PHYSICAL EXAMINATION: ECOG PERFORMANCE STATUS: 0 - Asymptomatic  Vitals:   02/26/22 1031  BP: (!) 139/95  Pulse: 87  Resp: 16  Temp: 97.7 F (36.5 C)  SpO2: 99%   Filed Weights   02/26/22 1031  Weight: 149 lb 3.2 oz (67.7 kg)    GENERAL:alert, no distress and comfortable SKIN: skin color, texture, turgor are normal, no rashes or significant lesions EYES: normal, conjunctiva are pink and non-injected, sclera clear OROPHARYNX:no exudate, no erythema and lips, buccal mucosa, and tongue normal  NECK: supple, thyroid normal size, non-tender, without nodularity LYMPH:  no palpable lymphadenopathy in the cervical, axillary or inguinal LUNGS: clear to auscultation and percussion with normal breathing effort HEART: regular rate & rhythm and no murmurs and no lower extremity edema ABDOMEN:abdomen soft, non-tender and normal bowel sounds Musculoskeletal:no cyanosis of digits and no clubbing  PSYCH: alert & oriented x 3 with fluent speech NEURO: no focal  motor/sensory deficits BREAST: Both breasts inspected and palpated.  No palpable masses or regional adenopathy.  LABORATORY DATA:  I have reviewed the data as listed Lab Results  Component Value Date   WBC 10.4 01/13/2021   HGB 14.7 01/13/2021   HCT 44.4 01/13/2021   MCV 96.3 01/13/2021   PLT 257 01/13/2021   Lab Results  Component Value Date   NA 140 01/13/2021   K 4.3 01/13/2021   CL 104 01/13/2021   CO2 26 01/13/2021    RADIOGRAPHIC STUDIES: I have personally reviewed the radiological reports and agreed with the findings in the report.  ASSESSMENT AND PLAN:  Ductal carcinoma in situ (DCIS) of left breast This is a very pleasant 76 year old female patient with newly diagnosed left breast DCIS, ER/PR positive status post left lumpectomy who is here for an initial visit. We have discussed the following details about DCIS and antiestrogen therapy.  Pathology review: I discussed with the patient the difference between DCIS and invasive breast cancer. It is considered a precancerous lesion. DCIS is classified as a Stage 0 breast cancer. It is generally detected through mammograms as calcifications. We discussed the significance of grades and its impact on prognosis. We also discussed the importance of ER and PR receptors and their implications to adjuvant treatment options. Prognosis of DCIS dependence on grade and degree of comedo necrosis. It is anticipated that if not treated, 20-30% of DCIS can develop into invasive breast cancer.  Recommendation: 1. Breast conserving surgery performed on 9/27 2. Followed by adjuvant radiation therapy 3. Followed by antiestrogen therapy with tamoxifen/aromatase inhibitors based on menopausal status 5 years  Tamoxifen counseling: We discussed the risks and benefits of tamoxifen. These include but not limited to insomnia, hot flashes, mood changes, vaginal dryness, and weight gain. Although rare, serious side effects including endometrial cancer,  risk of blood clots were also discussed. We strongly believe that the benefits far outweigh the risks. Patient understands these risks and consented to starting treatment. Planned treatment duration is 5 years.  She has underlying diagnosis of osteoporosis, last bone density in 2021 and has an upcoming appointment for repeat bone density in December. I think she will be a good candidate for tamoxifen or aromatase inhibitors.  When she returns for follow-up, we will review her bone density and discuss once again about treatment options.  She is in agreement with this plan. Aromatase inhibitors counseling: We have discussed the mechanism of action of aromatase inhibitors today.  We have discussed adverse effects including but not limited to menopausal symptoms, increased risk of osteoporosis and fractures, cardiovascular events, arthralgias and myalgias.  We do believe that the benefits far outweigh the risks.  Plan treatment duration of 5 years.   Total time spent: 45 minutes including history, physical exam, review of records, counseling and coordination of care All questions were answered. The patient knows to call the clinic with any problems, questions or concerns.    Benay Pike, MD 02/26/22

## 2022-03-03 ENCOUNTER — Encounter (HOSPITAL_COMMUNITY): Payer: Self-pay

## 2022-03-04 ENCOUNTER — Inpatient Hospital Stay: Payer: Medicare HMO | Admitting: Licensed Clinical Social Worker

## 2022-03-04 NOTE — Progress Notes (Signed)
[facility] Work  Initial Assessment   Maria Gomez is a 76 y.o. year old female contacted by phone. Clinical Social Work was referred by new patient protocol for assessment of psychosocial needs.   SDOH (Social Determinants of Health) assessments performed: Yes SDOH Interventions    Flowsheet Row Clinical Support from 03/04/2022 in [facility] Clinical Support from 11/06/2021 in [facility] from 10/24/2020 in [facility] Interventions     Food Insecurity Interventions Intervention Not Indicated Intervention Not Indicated Intervention Not Indicated  Housing Interventions -- Intervention Not Indicated Intervention Not Indicated  Transportation Interventions Intervention Not Indicated Intervention Not Indicated --  Financial Strain Interventions Intervention Not Indicated Intervention Not Indicated --  Physical Activity Interventions -- Intervention Not Indicated Intervention Not Indicated  Stress Interventions -- Intervention Not Indicated Intervention Not Indicated  Social Connections Interventions -- Intervention Not Indicated Intervention Not Indicated       SDOH Screenings   Food Insecurity: No Food Insecurity (03/04/2022)  Housing: Low Risk  (11/06/2021)  Transportation Needs: No Transportation Needs (03/04/2022)  Alcohol Screen: Low Risk  (11/06/2021)  Depression (PHQ2-9): Low Risk  (11/06/2021)  Financial Resource Strain: Low Risk  (03/04/2022)  Physical Activity: Insufficiently Active (11/06/2021)  Social Connections: Moderately Integrated (11/06/2021)  Stress: No Stress Concern Present (11/06/2021)  Tobacco Use: Low Risk  (02/26/2022)     Distress Screen completed: No    02/16/2022    1:38 PM  ONCBCN DISTRESS SCREENING  Screening Type Initial Screening  Distress experienced in past week (1-10) 0  Physical Problem type Sleep/insomnia      Family/Social Information:   Housing Arrangement: patient lives alone Family members/support persons in your life? Family, Friends, and Geographical information systems officer concerns: no  Employment: Retired .  Income source: Paediatric nurse concerns: No Type of concern: None Food access concerns: no Religious or spiritual practice: Not known Services Currently in place:  n/a  Coping/ Adjustment to diagnosis: Patient understands treatment plan and what happens next? yes, has had her surgery and is getting ready for radiation. Feels she is coping well so far Concerns about diagnosis and/or treatment: I'm not especially worried about anything Current coping skills/ strengths: Ability for insight , Capable of independent living , and Supportive family/friends     SUMMARY: Current SDOH Barriers:  None noted today  Clinical Social Work Clinical Goal(s):  No clinical social work goals at this time  Interventions: Discussed common feeling and emotions when being diagnosed with cancer, and the importance of support during treatment Informed patient of the support team roles and support services at [facility] Provided [facility] contact information and encouraged patient to call with any questions or concerns   Follow Up Plan: Patient will contact CSW with any support or resource needs Patient verbalizes understanding of plan: Yes    Marque Rademaker E Sangita Zani, LCSW

## 2022-03-09 ENCOUNTER — Other Ambulatory Visit: Payer: Medicare HMO

## 2022-03-09 DIAGNOSIS — F419 Anxiety disorder, unspecified: Secondary | ICD-10-CM

## 2022-03-09 DIAGNOSIS — I340 Nonrheumatic mitral (valve) insufficiency: Secondary | ICD-10-CM | POA: Diagnosis not present

## 2022-03-09 DIAGNOSIS — E7800 Pure hypercholesterolemia, unspecified: Secondary | ICD-10-CM | POA: Diagnosis not present

## 2022-03-09 DIAGNOSIS — E039 Hypothyroidism, unspecified: Secondary | ICD-10-CM | POA: Diagnosis not present

## 2022-03-10 LAB — CBC WITH DIFFERENTIAL/PLATELET
Absolute Monocytes: 387 cells/uL (ref 200–950)
Basophils Absolute: 26 cells/uL (ref 0–200)
Basophils Relative: 0.3 %
Eosinophils Absolute: 60 cells/uL (ref 15–500)
Eosinophils Relative: 0.7 %
HCT: 43.8 % (ref 35.0–45.0)
Hemoglobin: 14.9 g/dL (ref 11.7–15.5)
Lymphs Abs: 1944 cells/uL (ref 850–3900)
MCH: 31.8 pg (ref 27.0–33.0)
MCHC: 34 g/dL (ref 32.0–36.0)
MCV: 93.4 fL (ref 80.0–100.0)
MPV: 10.8 fL (ref 7.5–12.5)
Monocytes Relative: 4.5 %
Neutro Abs: 6183 cells/uL (ref 1500–7800)
Neutrophils Relative %: 71.9 %
Platelets: 296 10*3/uL (ref 140–400)
RBC: 4.69 10*6/uL (ref 3.80–5.10)
RDW: 12.5 % (ref 11.0–15.0)
Total Lymphocyte: 22.6 %
WBC: 8.6 10*3/uL (ref 3.8–10.8)

## 2022-03-10 LAB — COMPREHENSIVE METABOLIC PANEL
AG Ratio: 1.6 (calc) (ref 1.0–2.5)
ALT: 14 U/L (ref 6–29)
AST: 17 U/L (ref 10–35)
Albumin: 4.4 g/dL (ref 3.6–5.1)
Alkaline phosphatase (APISO): 90 U/L (ref 37–153)
BUN/Creatinine Ratio: 14 (calc) (ref 6–22)
BUN: 15 mg/dL (ref 7–25)
CO2: 26 mmol/L (ref 20–32)
Calcium: 10.1 mg/dL (ref 8.6–10.4)
Chloride: 105 mmol/L (ref 98–110)
Creat: 1.1 mg/dL — ABNORMAL HIGH (ref 0.60–1.00)
Globulin: 2.7 g/dL (calc) (ref 1.9–3.7)
Glucose, Bld: 108 mg/dL — ABNORMAL HIGH (ref 65–99)
Potassium: 4.8 mmol/L (ref 3.5–5.3)
Sodium: 140 mmol/L (ref 135–146)
Total Bilirubin: 0.5 mg/dL (ref 0.2–1.2)
Total Protein: 7.1 g/dL (ref 6.1–8.1)

## 2022-03-10 LAB — LIPID PANEL
Cholesterol: 151 mg/dL (ref <200)
HDL: 53 mg/dL (ref >=50)
LDL Cholesterol (Calc): 69 mg/dL (calc)
Non-HDL Cholesterol (Calc): 98 mg/dL (calc) (ref <130)
Total CHOL/HDL Ratio: 2.8 (calc) (ref <5.0)
Triglycerides: 232 mg/dL — ABNORMAL HIGH (ref <150)

## 2022-03-10 LAB — TSH: TSH: 3.42 mIU/L (ref 0.40–4.50)

## 2022-03-12 ENCOUNTER — Ambulatory Visit (INDEPENDENT_AMBULATORY_CARE_PROVIDER_SITE_OTHER): Payer: Medicare HMO | Admitting: Family Medicine

## 2022-03-12 VITALS — BP 118/72 | HR 70 | Ht 62.0 in | Wt 150.8 lb

## 2022-03-12 DIAGNOSIS — Z9889 Other specified postprocedural states: Secondary | ICD-10-CM | POA: Diagnosis not present

## 2022-03-12 DIAGNOSIS — E7800 Pure hypercholesterolemia, unspecified: Secondary | ICD-10-CM

## 2022-03-12 DIAGNOSIS — E039 Hypothyroidism, unspecified: Secondary | ICD-10-CM

## 2022-03-12 NOTE — Progress Notes (Signed)
Subjective:    Patient ID: Maria Gomez, female    DOB: 1946/05/11, 76 y.o.   MRN: [id number] Since I last saw the patient, she was diagnosed with breast cancer in the left breast.  Fortunately they called the cancer early and she is cancer free.  Margins were clear and there was no evidence of any lymph node involvement.  She is already had her flu shot.  She has not had her COVID shot.  Her blood pressure today is outstanding at [place].  She denies any chest pain shortness of breath or dyspnea on exertion.  She has a history of a mitral valve repair.  She denies any orthopnea or paroxysmal nocturnal dyspnea.  There is no evidence of any peripheral edema or fluid overload to suggest heart failure.  Overall she is doing very well with no concerns Past Medical History:  Diagnosis Date   Cancer ([place]) 12/2021   left breast DCIS   Hypothyroidism    Mitral regurgitation    Osteopenia    S/P minimally invasive mitral valve repair 03/10/2018   Complex valvuloplasty including triangular resection of posterior leaflet, artificial Gore-tex neochord x6 and Size 30 Sorin Memo 4D Ring SN [id number] via right mini thoracotomy approach   Thyroid nodule 03/07/2018   Incidental - noted on CT scan, atypical cells found on fine-needle aspiration.  General surgery consultation and excision recommended February 2020   Past Surgical History:  Procedure Laterality Date   BREAST EXCISIONAL BIOPSY Bilateral    BREAST LUMPECTOMY WITH RADIOACTIVE SEED LOCALIZATION Left 02/18/2022   Procedure: LEFT BREAST LUMPECTOMY WITH RADIOACTIVE SEED LOCALIZATION;  Surgeon: Jovita Kussmaul, MD;  Location: [place];  Service: General;  Laterality: Left;   MITRAL VALVE REPAIR Right 03/10/2018   Procedure: MINIMALLY INVASIVE MITRAL VALVE REPAIR (MVR):  -Triangular Resection of Posterior Leaflet P2; -Shortening Plasty of Posterior Leaflet P2; -Placement of Goretex Neo Chords x 6 using Chord-X system; -Ring  Annuloplasty with a 30 mm Sorin Memo 4D Ring;  Surgeon: Rexene Alberts, MD;  Location: [place];  Service: Open Heart Surgery;  Laterality: Right;   RIGHT/LEFT HEART CATH AND CORONARY ANGIOGRAPHY N/A 01/18/2018   Procedure: RIGHT/LEFT HEART CATH AND CORONARY ANGIOGRAPHY;  Surgeon: Martinique, Peter M, MD;  Location: [place] [facility];  Service: Cardiovascular;  Laterality: N/A;   TEE WITHOUT CARDIOVERSION N/A 11/09/2017   Procedure: TRANSESOPHAGEAL ECHOCARDIOGRAM (TEE);  Surgeon: Larey Dresser, MD;  Location: [facility] ENDOS[facility];  Service: Cardiovascular;  Laterality: N/A;   TEE WITHOUT CARDIOVERSION N/A 03/10/2018   Procedure: TRANSESOPHAGEAL ECHOCARDIOGRAM (TEE);  Surgeon: Rexene Alberts, MD;  Location: [place];  Service: Open Heart Surgery;  Laterality: N/A;   Current Outpatient Medications on File Prior to Visit  Medication Sig Dispense Refill   atorvastatin (LIPITOR) 20 MG tablet Take 1 tablet (20 mg total) by mouth daily. 90 tablet 3   Biotin 5000 MCG TABS Take 5,000 mcg by mouth daily.      Calcium Carb-Cholecalciferol (CALTRATE 600+D3) 600-20 MG-MCG TABS 2 tablet with a meal     Coenzyme Q10 (CO Q 10 PO) Take 800 mg by mouth daily.      diphenhydrAMINE HCl, Sleep, (UNISOM SLEEPGELS) 50 MG CAPS 1 capsule at bedtime as needed     Fiber Adult Gummies 2 g CHEW 2 tablets     levothyroxine (SYNTHROID) 25 MCG tablet Take 1 tablet (25 mcg total) by mouth daily before breakfast. 90 tablet 0   Melatonin 10-10 MG TBCR 2  tablet at bedtime as needed     Multiple Vitamin (MULTIVITAMIN) tablet Take 1 tablet by mouth daily. Centrum Silver     Polyethylene Glycol 400 0.25 % SOLN Apply to eye.     vitamin B-12 (CYANOCOBALAMIN) 1000 MCG tablet Take 1,000 mcg by mouth daily.     zinc gluconate 50 MG tablet Take 50 mg by mouth daily.     No current facility-administered medications on file prior to visit.   Allergies  Allergen Reactions   Niaspan [Niacin Er] Other (See Comments)    Severe flushing    Phenazopyridine Hcl Other (See Comments)    Made sick    Statins     Severe flushing   Social History   Socioeconomic History   Marital status: Widowed    Spouse name: Not on file   Number of children: Not on file   Years of education: Not on file   Highest education level: Not on file  Occupational History   Not on file  Tobacco Use   Smoking status: Never   Smokeless tobacco: Never  Vaping Use   Vaping Use: Never used  Substance and Sexual Activity   Alcohol use: No   Drug use: No   Sexual activity: Not Currently    Birth control/protection: Post-menopausal  Other Topics Concern   Not on file  Social History Narrative   Lives with her mom.    Takes care of aunt who lives across the street.    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         Social Determinants of Health   Financial Resource Strain: Low Risk  (03/04/2022)   Overall Financial Resource Strain (CARDIA)    Difficulty of Paying Living Expenses: Not hard at all  Food Insecurity: No Food Insecurity (03/04/2022)   Hunger Vital Sign    Worried About Running Out of Food in the Last Year: Never true    Ran Out of Food in the Last Year: Never true  Transportation Needs: No Transportation Needs (03/04/2022)   PRAPARE - Hydrologist (Medical): No    Lack of Transportation (Non-Medical): No  Physical Activity: Insufficiently Active (11/06/2021)   Exercise Vital Sign    Days of Exercise per Week: 5 days    Minutes of Exercise per Session: 20 min  Stress: No Stress Concern Present (11/06/2021)   [place]    Feeling of Stress : Not at all  Social Connections: Moderately Integrated (11/06/2021)   Social Connection and Isolation Panel [NHANES]    Frequency of Communication with Friends and Family: More than three times a week    Frequency of Social Gatherings with Friends and Family: More than  three times a week    Attends Religious Services: More than 4 times per year    Active Member of [employer] or Organizations: Yes    Attends Archivist Meetings: More than 4 times per year    Marital Status: Widowed  Intimate Partner Violence: Not At Risk (02/16/2022)   Humiliation, Afraid, Rape, and Kick questionnaire    Fear of Current or Ex-Partner: No    Emotionally Abused: No    Physically Abused: No    Sexually Abused: No      Review of Systems  All other systems reviewed and are negative.      Objective:   Physical Exam Vitals reviewed.  Constitutional:  General: She is not in acute distress.    Appearance: Normal appearance. She is normal weight. She is not ill-appearing, toxic-appearing or diaphoretic.  HENT:     Head: Normocephalic and atraumatic.     Nose: Nose normal. No congestion or rhinorrhea.     Mouth/Throat:     Pharynx: No oropharyngeal exudate or posterior oropharyngeal erythema.  Eyes:     General: No scleral icterus.       Right eye: No discharge.        Left eye: No discharge.     Extraocular Movements: Extraocular movements intact.     Conjunctiva/sclera: Conjunctivae normal.     Pupils: Pupils are equal, round, and reactive to light.  Neck:     Thyroid: No thyromegaly.     Vascular: No JVD.     Trachea: No tracheal deviation.  Cardiovascular:     Rate and Rhythm: Normal rate and regular rhythm.     Heart sounds: Murmur heard.     No friction rub. No gallop.  Pulmonary:     Effort: Pulmonary effort is normal. No respiratory distress.     Breath sounds: Normal breath sounds. No stridor. No wheezing, rhonchi or rales.  Chest:     Chest wall: No tenderness.  Musculoskeletal:        General: No swelling, tenderness or deformity.     Cervical back: No muscular tenderness.     Right lower leg: No edema.     Left lower leg: No edema.  Skin:    General: Skin is warm.     Coloration: Skin is not jaundiced or pale.     Findings: No  bruising, erythema, lesion or rash.  Neurological:     Mental Status: She is alert.             Assessment & Plan:  Pure hypercholesterolemia  Hypothyroidism, unspecified type  S/P minimally invasive mitral valve repair Patient's blood pressure is excellent.  Her thyroid test is normal.  Her blood sugar is elevated at 108.  Her triglycerides are elevated at over 230.  I recommended reducing her consumption of carbohydrates specifically starches and sweets.  Also recommended a low-fat diet try to eat more fruits vegetables.  I believe that would help with both of these issues.  I encouraged the patient to get her COVID shot.  Her cholesterol otherwise is outstanding and her thyroid test is normal

## 2022-03-13 NOTE — Progress Notes (Signed)
Location of Breast Cancer: ductal carcinoma in situ in the upper portion of the left breast   Histology per Pathology Report:    01/28/2022 Diagnosis Breast, left, needle core biopsy, upper central, coil clip - DUCTAL CARCINOMA IN SITU, SOLID TYPE WITH COMEDONECROSIS AND ASSOCIATED CALCIFICATIONS, NUCLEAR GRADE 2-3 OF 3 - NECROSIS: PRESENT - CALCIFICATIONS: PRESENT - DCIS LENGTH: 4 MM IN GREATEST LINEAR DIMENSION ON FRAGMENTED CORES  02/18/2022 INAL MICROSCOPIC DIAGNOSIS:   A. LEFT BREAST, LUMPECTOMY:  Ductal carcinoma in situ, intermediate to high nuclear grade, solid and  cribriform types with focal necrosis  Negative for invasive carcinoma  Margins free of carcinoma (DCIS 4 mm from medial margin)  Changes consistent with prior biopsy  Fibrocystic changes including stromal fibrosis, adenosis, sclerosing  adenosis, cystic dilatation of ducts and focal usual duct hyperplasia  Microcalcifications present within DCIS and benign adenosis   B. LEFT BREAST, ADDITIONAL DEEP MARGIN, EXCISION:  Benign breast, predominantly mature adipose, with focal fibrocystic  changes including usual duct hyperplasia  Negative for carcinoma   Receptor Status: ER(100%), PR (70%)   Did patient present with symptoms (if so, please note symptoms) or was this found on screening mammography?: screening mammogram   Past/Anticipated interventions by surgeon, if any: left breast lumpectomy with radioactive seed localization 02/18/2022 by Dr. Marlou Starks.   Past/Anticipated interventions by medical oncology, if any: antiestrogen therapy to follow radiation with tamoxifen/aromatase inhibitors based on menopausal status 5 years     Lymphedema issues, if any:No    Pain issues, if any: No      SAFETY ISSUES: Prior radiation? No Pacemaker/ICD? No Possible current pregnancy? Postmenopausal Is the patient on methotrexate? No   Current Complaints / other details:   Mitral Valve Repair 2019   BP 125/75 (BP  Location: Left Arm, Patient Position: Sitting)   Pulse 72   Temp (!) 96.3 F (35.7 C) (Temporal)   Resp 18   Ht '5\' 2"'$  (1.575 m)   Wt 149 lb (67.6 kg)   SpO2 100%   BMI 27.25 kg/m

## 2022-03-17 NOTE — Progress Notes (Signed)
Radiation Oncology         [phone]) [phone] ________________________________  Name: Maria Gomez MRN: [id number]  Date: 03/18/2022  DOB: [birth year]  Re-Evaluation Note  CC: Susy Frizzle, MD  Benay Pike, MD  No diagnosis found.  Diagnosis:  S/p lumpectomy: Stage 0 (cTis (DCIS), cN0, cM0) Left Breast, Intermediate to high-grade DCIS, ER+ / PR+ / Her2 not assessed  Narrative:  The patient returns today to discuss radiation treatment options. She was seen in consultation on 02/16/22.   She opted to proceed with a left breast lumpectomy without nodal biopsies on 02/18/22 under the care of Dr. Marlou Starks. Pathology from the procedure revealed: intermediate to high-grade DCIS with focal necrosis. All margins negative for in-situ carcinoma. Prognostic indicators significant for: estrogen receptor 100% positive and progesterone receptor 70% positive, both with strong staining intensity; Her2 not assessed.   The patient has met with Dr. Chryl Heck and has agreed to proceed with tamoxifen or AI following XRT pending a repeat bone density study this coming December. The patient has an underlying diagnosis of osteoporosis. She will return to Dr. Chryl Heck following her repeat bone density to further discuss treatment options.   On review of systems, the patient reports ***. She denies *** and any other symptoms.    Allergies:  is allergic to niaspan [niacin er], phenazopyridine hcl, and statins.  Meds: Current Outpatient Medications  Medication Sig Dispense Refill   atorvastatin (LIPITOR) 20 MG tablet Take 1 tablet (20 mg total) by mouth daily. 90 tablet 3   Biotin 5000 MCG TABS Take 5,000 mcg by mouth daily.      Calcium Carb-Cholecalciferol (CALTRATE 600+D3) 600-20 MG-MCG TABS 2 tablet with a meal     Coenzyme Q10 (CO Q 10 PO) Take 800 mg by mouth daily.      diphenhydrAMINE HCl, Sleep, (UNISOM SLEEPGELS) 50 MG CAPS 1 capsule at bedtime as needed     Fiber Adult Gummies 2 g CHEW 2 tablets      levothyroxine (SYNTHROID) 25 MCG tablet Take 1 tablet (25 mcg total) by mouth daily before breakfast. 90 tablet 0   Melatonin 10-10 MG TBCR 2 tablet at bedtime as needed     Multiple Vitamin (MULTIVITAMIN) tablet Take 1 tablet by mouth daily. Centrum Silver     Polyethylene Glycol 400 0.25 % SOLN Apply to eye.     vitamin B-12 (CYANOCOBALAMIN) 1000 MCG tablet Take 1,000 mcg by mouth daily.     zinc gluconate 50 MG tablet Take 50 mg by mouth daily.     No current facility-administered medications for this encounter.    Physical Findings: The patient is in no acute distress. Patient is alert and oriented.  vitals were not taken for this visit.  No significant changes. Lungs are clear to auscultation bilaterally. Heart has regular rate and rhythm. No palpable cervical, supraclavicular, or axillary adenopathy. Abdomen soft, non-tender, normal bowel sounds. Right Breast: no palpable mass, nipple discharge or bleeding. Left Breast: ***  Lab Findings: Lab Results  Component Value Date   WBC 8.6 03/09/2022   HGB 14.9 03/09/2022   HCT 43.8 03/09/2022   MCV 93.4 03/09/2022   PLT 296 03/09/2022    Radiographic Findings: MM Breast Surgical Specimen  Result Date: 02/18/2022 CLINICAL DATA:  Specimen radiograph of the left breast. EXAM: SPECIMEN RADIOGRAPH OF THE LEFT BREAST COMPARISON:  Previous exam(s). FINDINGS: Status post excision of the left breast. The radioactive seed and coil shaped biopsy marker clip are present, completely intact, and were  marked for pathology. IMPRESSION: Specimen radiograph of the left breast. Electronically Signed   By: Lillia Mountain M.D.   On: 02/18/2022 13:58  MM LT RADIOACTIVE SEED LOC MAMMO GUIDE  Result Date: 02/17/2022 CLINICAL DATA:  Patient with LEFT breast DCIS scheduled for lumpectomy requiring preoperative radioactive seed localization. EXAM: MAMMOGRAPHIC GUIDED RADIOACTIVE SEED LOCALIZATION OF THE LEFT BREAST COMPARISON:  Previous exam(s). FINDINGS:  Patient presents for radioactive seed localization prior to lumpectomy. I met with the patient and we discussed the procedure of seed localization including benefits and alternatives. We discussed the high likelihood of a successful procedure. We discussed the risks of the procedure including infection, bleeding, tissue injury and further surgery. We discussed the low dose of radioactivity involved in the procedure. Informed, written consent was given. The usual time-out protocol was performed immediately prior to the procedure. Using mammographic guidance, sterile technique, 1% lidocaine and an I-125 radioactive seed, the coil shaped clip within the upper central LEFT breast was localized using a lateral approach. The follow-up mammogram images confirm the seed in the expected location and were marked for Dr. Marlou Starks. Follow-up survey of the patient confirms presence of the radioactive seed. Order number of I-125 seed:  [id number]. Total activity:  2.438 millicuries reference Date: 02/05/2022 The patient tolerated the procedure well and was released from the [facility]. She was given instructions regarding seed removal. IMPRESSION: Radioactive seed localization left breast. No apparent complications. Electronically Signed   By: Franki Cabot M.D.   On: 02/17/2022 13:28   Impression:  S/p lumpectomy: Stage 0 (cTis (DCIS), cN0, cM0) Left Breast, Intermediate to high-grade DCIS, ER+ / PR+ / Her2 not assessed  ***  Plan:  Patient is scheduled for CT simulation {date/later today}. ***  -----------------------------------  Blair Promise, PhD, MD  This document serves as a record of services personally performed by Gery Pray, MD. It was created on his behalf by Roney Mans, a trained medical scribe. The creation of this record is based on the scribe's personal observations and the provider's statements to them. This document has been checked and approved by the attending provider.

## 2022-03-18 ENCOUNTER — Ambulatory Visit
Admission: RE | Admit: 2022-03-18 | Discharge: 2022-03-18 | Disposition: A | Discharge status: Home / Self Care | Payer: Medicare HMO | Source: Ambulatory Visit | Attending: Radiation Oncology | Admitting: Radiation Oncology

## 2022-03-18 ENCOUNTER — Encounter: Payer: Self-pay | Admitting: Radiation Oncology

## 2022-03-18 VITALS — BP 125/75 | HR 72 | Temp 96.3°F | Resp 18 | Ht 62.0 in | Wt 149.0 lb

## 2022-03-18 DIAGNOSIS — D0512 Intraductal carcinoma in situ of left breast: Secondary | ICD-10-CM | POA: Insufficient documentation

## 2022-03-18 DIAGNOSIS — M810 Age-related osteoporosis without current pathological fracture: Secondary | ICD-10-CM | POA: Diagnosis not present

## 2022-03-18 DIAGNOSIS — Z170 Estrogen receptor positive status [ER+]: Secondary | ICD-10-CM | POA: Insufficient documentation

## 2022-03-18 DIAGNOSIS — Z79899 Other long term (current) drug therapy: Secondary | ICD-10-CM | POA: Diagnosis not present

## 2022-03-18 DIAGNOSIS — Z510 Encounter for antineoplastic radiation therapy: Secondary | ICD-10-CM | POA: Insufficient documentation

## 2022-03-23 ENCOUNTER — Ambulatory Visit
Admission: RE | Admit: 2022-03-23 | Discharge: 2022-03-23 | Disposition: A | Discharge status: Home / Self Care | Payer: Medicare HMO | Source: Ambulatory Visit | Attending: Radiation Oncology | Admitting: Radiation Oncology

## 2022-03-23 DIAGNOSIS — Z170 Estrogen receptor positive status [ER+]: Secondary | ICD-10-CM | POA: Diagnosis not present

## 2022-03-23 DIAGNOSIS — D0512 Intraductal carcinoma in situ of left breast: Secondary | ICD-10-CM | POA: Diagnosis not present

## 2022-03-23 NOTE — Addendum Note (Signed)
Encounter addended by: Gery Pray, MD on: 03/23/2022 6:48 PM  Actions taken: Allergies reviewed, Medication List reviewed

## 2022-03-25 ENCOUNTER — Ambulatory Visit: Payer: Medicare HMO | Admitting: Radiation Oncology

## 2022-03-25 ENCOUNTER — Encounter: Payer: Self-pay | Admitting: *Deleted

## 2022-03-26 ENCOUNTER — Ambulatory Visit: Payer: Medicare HMO

## 2022-03-26 DIAGNOSIS — D0512 Intraductal carcinoma in situ of left breast: Secondary | ICD-10-CM | POA: Diagnosis not present

## 2022-03-26 DIAGNOSIS — Z170 Estrogen receptor positive status [ER+]: Secondary | ICD-10-CM | POA: Diagnosis not present

## 2022-03-27 ENCOUNTER — Ambulatory Visit: Payer: Medicare HMO

## 2022-03-30 ENCOUNTER — Ambulatory Visit
Admission: RE | Admit: 2022-03-30 | Discharge: 2022-03-30 | Disposition: A | Discharge status: Home / Self Care | Payer: Medicare HMO | Source: Ambulatory Visit | Attending: Radiation Oncology | Admitting: Radiation Oncology

## 2022-03-30 ENCOUNTER — Other Ambulatory Visit: Payer: Self-pay

## 2022-03-30 DIAGNOSIS — D0512 Intraductal carcinoma in situ of left breast: Secondary | ICD-10-CM | POA: Diagnosis not present

## 2022-03-30 DIAGNOSIS — Z170 Estrogen receptor positive status [ER+]: Secondary | ICD-10-CM | POA: Diagnosis not present

## 2022-03-30 DIAGNOSIS — Z510 Encounter for antineoplastic radiation therapy: Secondary | ICD-10-CM | POA: Diagnosis not present

## 2022-03-30 LAB — RAD ONC ARIA SESSION SUMMARY
Course Elapsed Days: 0
Plan Fractions Treated to Date: 1
Plan Prescribed Dose Per Fraction: 2.67 Gy
Plan Total Fractions Prescribed: 15
Plan Total Prescribed Dose: 40.05 Gy
Reference Point Dosage Given to Date: 2.67 Gy
Reference Point Session Dosage Given: 2.67 Gy
Session Number: 1

## 2022-03-31 ENCOUNTER — Ambulatory Visit
Admission: RE | Admit: 2022-03-31 | Discharge: 2022-03-31 | Disposition: A | Discharge status: Home / Self Care | Payer: Medicare HMO | Source: Ambulatory Visit | Attending: Radiation Oncology | Admitting: Radiation Oncology

## 2022-03-31 ENCOUNTER — Ambulatory Visit: Payer: Medicare HMO

## 2022-03-31 ENCOUNTER — Other Ambulatory Visit: Payer: Self-pay

## 2022-03-31 DIAGNOSIS — Z510 Encounter for antineoplastic radiation therapy: Secondary | ICD-10-CM | POA: Diagnosis not present

## 2022-03-31 DIAGNOSIS — D0512 Intraductal carcinoma in situ of left breast: Secondary | ICD-10-CM | POA: Diagnosis not present

## 2022-03-31 DIAGNOSIS — Z170 Estrogen receptor positive status [ER+]: Secondary | ICD-10-CM | POA: Diagnosis not present

## 2022-03-31 LAB — RAD ONC ARIA SESSION SUMMARY
Course Elapsed Days: 1
Plan Fractions Treated to Date: 2
Plan Prescribed Dose Per Fraction: 2.67 Gy
Plan Total Fractions Prescribed: 15
Plan Total Prescribed Dose: 40.05 Gy
Reference Point Dosage Given to Date: 5.34 Gy
Reference Point Session Dosage Given: 2.67 Gy
Session Number: 2

## 2022-04-01 ENCOUNTER — Other Ambulatory Visit: Payer: Self-pay

## 2022-04-01 ENCOUNTER — Ambulatory Visit
Admission: RE | Admit: 2022-04-01 | Discharge: 2022-04-01 | Disposition: A | Discharge status: Home / Self Care | Payer: Medicare HMO | Source: Ambulatory Visit | Attending: Radiation Oncology | Admitting: Radiation Oncology

## 2022-04-01 DIAGNOSIS — D0512 Intraductal carcinoma in situ of left breast: Secondary | ICD-10-CM

## 2022-04-01 DIAGNOSIS — Z170 Estrogen receptor positive status [ER+]: Secondary | ICD-10-CM | POA: Diagnosis not present

## 2022-04-01 DIAGNOSIS — Z510 Encounter for antineoplastic radiation therapy: Secondary | ICD-10-CM | POA: Diagnosis not present

## 2022-04-01 LAB — RAD ONC ARIA SESSION SUMMARY
Course Elapsed Days: 2
Plan Fractions Treated to Date: 3
Plan Prescribed Dose Per Fraction: 2.67 Gy
Plan Total Fractions Prescribed: 15
Plan Total Prescribed Dose: 40.05 Gy
Reference Point Dosage Given to Date: 8.01 Gy
Reference Point Session Dosage Given: 2.67 Gy
Session Number: 3

## 2022-04-01 MED ORDER — RADIAPLEXRX EX GEL: Freq: Once | CUTANEOUS | Status: AC

## 2022-04-02 ENCOUNTER — Ambulatory Visit
Admission: RE | Admit: 2022-04-02 | Discharge: 2022-04-02 | Disposition: A | Discharge status: Home / Self Care | Payer: Medicare HMO | Source: Ambulatory Visit | Attending: Radiation Oncology | Admitting: Radiation Oncology

## 2022-04-02 ENCOUNTER — Other Ambulatory Visit: Payer: Self-pay

## 2022-04-02 DIAGNOSIS — Z170 Estrogen receptor positive status [ER+]: Secondary | ICD-10-CM | POA: Diagnosis not present

## 2022-04-02 DIAGNOSIS — D0512 Intraductal carcinoma in situ of left breast: Secondary | ICD-10-CM | POA: Diagnosis not present

## 2022-04-02 DIAGNOSIS — K5900 Constipation, unspecified: Secondary | ICD-10-CM | POA: Diagnosis not present

## 2022-04-02 DIAGNOSIS — Z510 Encounter for antineoplastic radiation therapy: Secondary | ICD-10-CM | POA: Diagnosis not present

## 2022-04-02 LAB — RAD ONC ARIA SESSION SUMMARY
Course Elapsed Days: 3
Plan Fractions Treated to Date: 4
Plan Prescribed Dose Per Fraction: 2.67 Gy
Plan Total Fractions Prescribed: 15
Plan Total Prescribed Dose: 40.05 Gy
Reference Point Dosage Given to Date: 10.68 Gy
Reference Point Session Dosage Given: 2.67 Gy
Session Number: 4

## 2022-04-03 ENCOUNTER — Other Ambulatory Visit: Payer: Self-pay

## 2022-04-03 ENCOUNTER — Ambulatory Visit
Admission: RE | Admit: 2022-04-03 | Discharge: 2022-04-03 | Disposition: A | Discharge status: Home / Self Care | Payer: Medicare HMO | Source: Ambulatory Visit | Attending: Radiation Oncology | Admitting: Radiation Oncology

## 2022-04-03 DIAGNOSIS — Z510 Encounter for antineoplastic radiation therapy: Secondary | ICD-10-CM | POA: Diagnosis not present

## 2022-04-03 DIAGNOSIS — Z170 Estrogen receptor positive status [ER+]: Secondary | ICD-10-CM | POA: Diagnosis not present

## 2022-04-03 DIAGNOSIS — D0512 Intraductal carcinoma in situ of left breast: Secondary | ICD-10-CM | POA: Diagnosis not present

## 2022-04-03 LAB — RAD ONC ARIA SESSION SUMMARY
Course Elapsed Days: 4
Plan Fractions Treated to Date: 5
Plan Prescribed Dose Per Fraction: 2.67 Gy
Plan Total Fractions Prescribed: 15
Plan Total Prescribed Dose: 40.05 Gy
Reference Point Dosage Given to Date: 13.35 Gy
Reference Point Session Dosage Given: 2.67 Gy
Session Number: 5

## 2022-04-06 ENCOUNTER — Other Ambulatory Visit: Payer: Self-pay

## 2022-04-06 ENCOUNTER — Ambulatory Visit
Admission: RE | Admit: 2022-04-06 | Discharge: 2022-04-06 | Disposition: A | Discharge status: Home / Self Care | Payer: Medicare HMO | Source: Ambulatory Visit | Attending: Radiation Oncology | Admitting: Radiation Oncology

## 2022-04-06 DIAGNOSIS — D0512 Intraductal carcinoma in situ of left breast: Secondary | ICD-10-CM | POA: Diagnosis not present

## 2022-04-06 DIAGNOSIS — Z170 Estrogen receptor positive status [ER+]: Secondary | ICD-10-CM | POA: Diagnosis not present

## 2022-04-06 DIAGNOSIS — Z510 Encounter for antineoplastic radiation therapy: Secondary | ICD-10-CM | POA: Diagnosis not present

## 2022-04-06 LAB — RAD ONC ARIA SESSION SUMMARY
Course Elapsed Days: 7
Plan Fractions Treated to Date: 6
Plan Prescribed Dose Per Fraction: 2.67 Gy
Plan Total Fractions Prescribed: 15
Plan Total Prescribed Dose: 40.05 Gy
Reference Point Dosage Given to Date: 16.02 Gy
Reference Point Session Dosage Given: 2.67 Gy
Session Number: 6

## 2022-04-07 ENCOUNTER — Other Ambulatory Visit: Payer: Self-pay

## 2022-04-07 ENCOUNTER — Ambulatory Visit
Admission: RE | Admit: 2022-04-07 | Discharge: 2022-04-07 | Disposition: A | Discharge status: Home / Self Care | Payer: Medicare HMO | Source: Ambulatory Visit | Attending: Radiation Oncology | Admitting: Radiation Oncology

## 2022-04-07 ENCOUNTER — Ambulatory Visit: Payer: Medicare HMO | Admitting: Radiation Oncology

## 2022-04-07 DIAGNOSIS — Z170 Estrogen receptor positive status [ER+]: Secondary | ICD-10-CM | POA: Diagnosis not present

## 2022-04-07 DIAGNOSIS — Z510 Encounter for antineoplastic radiation therapy: Secondary | ICD-10-CM | POA: Diagnosis not present

## 2022-04-07 DIAGNOSIS — D0512 Intraductal carcinoma in situ of left breast: Secondary | ICD-10-CM | POA: Diagnosis not present

## 2022-04-07 LAB — RAD ONC ARIA SESSION SUMMARY
Course Elapsed Days: 8
Plan Fractions Treated to Date: 7
Plan Prescribed Dose Per Fraction: 2.67 Gy
Plan Total Fractions Prescribed: 15
Plan Total Prescribed Dose: 40.05 Gy
Reference Point Dosage Given to Date: 18.69 Gy
Reference Point Session Dosage Given: 2.67 Gy
Session Number: 7

## 2022-04-08 ENCOUNTER — Other Ambulatory Visit: Payer: Self-pay

## 2022-04-08 ENCOUNTER — Ambulatory Visit
Admission: RE | Admit: 2022-04-08 | Discharge: 2022-04-08 | Disposition: A | Discharge status: Home / Self Care | Payer: Medicare HMO | Source: Ambulatory Visit | Attending: Radiation Oncology | Admitting: Radiation Oncology

## 2022-04-08 DIAGNOSIS — Z170 Estrogen receptor positive status [ER+]: Secondary | ICD-10-CM | POA: Diagnosis not present

## 2022-04-08 DIAGNOSIS — Z510 Encounter for antineoplastic radiation therapy: Secondary | ICD-10-CM | POA: Diagnosis not present

## 2022-04-08 DIAGNOSIS — D0512 Intraductal carcinoma in situ of left breast: Secondary | ICD-10-CM | POA: Diagnosis not present

## 2022-04-08 LAB — RAD ONC ARIA SESSION SUMMARY
Course Elapsed Days: 9
Plan Fractions Treated to Date: 8
Plan Prescribed Dose Per Fraction: 2.67 Gy
Plan Total Fractions Prescribed: 15
Plan Total Prescribed Dose: 40.05 Gy
Reference Point Dosage Given to Date: 21.36 Gy
Reference Point Session Dosage Given: 2.67 Gy
Session Number: 8

## 2022-04-09 ENCOUNTER — Other Ambulatory Visit: Payer: Self-pay

## 2022-04-09 ENCOUNTER — Ambulatory Visit
Admission: RE | Admit: 2022-04-09 | Discharge: 2022-04-09 | Disposition: A | Discharge status: Home / Self Care | Payer: Medicare HMO | Source: Ambulatory Visit | Attending: Radiation Oncology | Admitting: Radiation Oncology

## 2022-04-09 DIAGNOSIS — D0512 Intraductal carcinoma in situ of left breast: Secondary | ICD-10-CM | POA: Diagnosis not present

## 2022-04-09 DIAGNOSIS — Z170 Estrogen receptor positive status [ER+]: Secondary | ICD-10-CM | POA: Diagnosis not present

## 2022-04-09 DIAGNOSIS — Z510 Encounter for antineoplastic radiation therapy: Secondary | ICD-10-CM | POA: Diagnosis not present

## 2022-04-09 LAB — RAD ONC ARIA SESSION SUMMARY
Course Elapsed Days: 10
Plan Fractions Treated to Date: 9
Plan Prescribed Dose Per Fraction: 2.67 Gy
Plan Total Fractions Prescribed: 15
Plan Total Prescribed Dose: 40.05 Gy
Reference Point Dosage Given to Date: 24.03 Gy
Reference Point Session Dosage Given: 2.67 Gy
Session Number: 9

## 2022-04-10 ENCOUNTER — Other Ambulatory Visit: Payer: Self-pay

## 2022-04-10 ENCOUNTER — Ambulatory Visit
Admission: RE | Admit: 2022-04-10 | Discharge: 2022-04-10 | Disposition: A | Discharge status: Home / Self Care | Payer: Medicare HMO | Source: Ambulatory Visit | Attending: Radiation Oncology | Admitting: Radiation Oncology

## 2022-04-10 DIAGNOSIS — D0512 Intraductal carcinoma in situ of left breast: Secondary | ICD-10-CM | POA: Diagnosis not present

## 2022-04-10 DIAGNOSIS — Z510 Encounter for antineoplastic radiation therapy: Secondary | ICD-10-CM | POA: Diagnosis not present

## 2022-04-10 DIAGNOSIS — Z170 Estrogen receptor positive status [ER+]: Secondary | ICD-10-CM | POA: Diagnosis not present

## 2022-04-10 LAB — RAD ONC ARIA SESSION SUMMARY
Course Elapsed Days: 11
Plan Fractions Treated to Date: 10
Plan Prescribed Dose Per Fraction: 2.67 Gy
Plan Total Fractions Prescribed: 15
Plan Total Prescribed Dose: 40.05 Gy
Reference Point Dosage Given to Date: 26.7 Gy
Reference Point Session Dosage Given: 2.67 Gy
Session Number: 10

## 2022-04-12 ENCOUNTER — Ambulatory Visit
Admission: RE | Admit: 2022-04-12 | Discharge: 2022-04-12 | Disposition: A | Discharge status: Home / Self Care | Payer: Medicare HMO | Source: Ambulatory Visit | Attending: Radiation Oncology | Admitting: Radiation Oncology

## 2022-04-12 ENCOUNTER — Other Ambulatory Visit: Payer: Self-pay

## 2022-04-12 DIAGNOSIS — D0512 Intraductal carcinoma in situ of left breast: Secondary | ICD-10-CM | POA: Diagnosis not present

## 2022-04-12 DIAGNOSIS — Z510 Encounter for antineoplastic radiation therapy: Secondary | ICD-10-CM | POA: Diagnosis not present

## 2022-04-12 DIAGNOSIS — Z170 Estrogen receptor positive status [ER+]: Secondary | ICD-10-CM | POA: Diagnosis not present

## 2022-04-12 LAB — RAD ONC ARIA SESSION SUMMARY
Course Elapsed Days: 13
Plan Fractions Treated to Date: 11
Plan Prescribed Dose Per Fraction: 2.67 Gy
Plan Total Fractions Prescribed: 15
Plan Total Prescribed Dose: 40.05 Gy
Reference Point Dosage Given to Date: 29.37 Gy
Reference Point Session Dosage Given: 2.67 Gy
Session Number: 11

## 2022-04-13 ENCOUNTER — Other Ambulatory Visit: Payer: Self-pay

## 2022-04-13 ENCOUNTER — Ambulatory Visit
Admission: RE | Admit: 2022-04-13 | Discharge: 2022-04-13 | Disposition: A | Discharge status: Home / Self Care | Payer: Medicare HMO | Source: Ambulatory Visit | Attending: Radiation Oncology | Admitting: Radiation Oncology

## 2022-04-13 DIAGNOSIS — D0512 Intraductal carcinoma in situ of left breast: Secondary | ICD-10-CM | POA: Diagnosis not present

## 2022-04-13 DIAGNOSIS — Z170 Estrogen receptor positive status [ER+]: Secondary | ICD-10-CM | POA: Diagnosis not present

## 2022-04-13 DIAGNOSIS — Z510 Encounter for antineoplastic radiation therapy: Secondary | ICD-10-CM | POA: Diagnosis not present

## 2022-04-13 LAB — RAD ONC ARIA SESSION SUMMARY
Course Elapsed Days: 14
Plan Fractions Treated to Date: 12
Plan Prescribed Dose Per Fraction: 2.67 Gy
Plan Total Fractions Prescribed: 15
Plan Total Prescribed Dose: 40.05 Gy
Reference Point Dosage Given to Date: 32.04 Gy
Reference Point Session Dosage Given: 2.67 Gy
Session Number: 12

## 2022-04-14 ENCOUNTER — Other Ambulatory Visit: Payer: Self-pay

## 2022-04-14 ENCOUNTER — Ambulatory Visit
Admission: RE | Admit: 2022-04-14 | Discharge: 2022-04-14 | Disposition: A | Discharge status: Home / Self Care | Payer: Medicare HMO | Source: Ambulatory Visit | Attending: Radiation Oncology | Admitting: Radiation Oncology

## 2022-04-14 DIAGNOSIS — D0512 Intraductal carcinoma in situ of left breast: Secondary | ICD-10-CM | POA: Diagnosis not present

## 2022-04-14 DIAGNOSIS — Z170 Estrogen receptor positive status [ER+]: Secondary | ICD-10-CM | POA: Diagnosis not present

## 2022-04-14 DIAGNOSIS — Z510 Encounter for antineoplastic radiation therapy: Secondary | ICD-10-CM | POA: Diagnosis not present

## 2022-04-14 LAB — RAD ONC ARIA SESSION SUMMARY
Course Elapsed Days: 15
Plan Fractions Treated to Date: 13
Plan Prescribed Dose Per Fraction: 2.67 Gy
Plan Total Fractions Prescribed: 15
Plan Total Prescribed Dose: 40.05 Gy
Reference Point Dosage Given to Date: 34.71 Gy
Reference Point Session Dosage Given: 2.67 Gy
Session Number: 13

## 2022-04-15 ENCOUNTER — Other Ambulatory Visit: Payer: Self-pay

## 2022-04-15 ENCOUNTER — Ambulatory Visit: Payer: Medicare HMO

## 2022-04-15 ENCOUNTER — Ambulatory Visit
Admission: RE | Admit: 2022-04-15 | Discharge: 2022-04-15 | Disposition: A | Discharge status: Home / Self Care | Payer: Medicare HMO | Source: Ambulatory Visit | Attending: Radiation Oncology | Admitting: Radiation Oncology

## 2022-04-15 DIAGNOSIS — Z170 Estrogen receptor positive status [ER+]: Secondary | ICD-10-CM | POA: Diagnosis not present

## 2022-04-15 DIAGNOSIS — D0512 Intraductal carcinoma in situ of left breast: Secondary | ICD-10-CM | POA: Diagnosis not present

## 2022-04-15 DIAGNOSIS — Z510 Encounter for antineoplastic radiation therapy: Secondary | ICD-10-CM | POA: Diagnosis not present

## 2022-04-15 LAB — RAD ONC ARIA SESSION SUMMARY
Course Elapsed Days: 16
Plan Fractions Treated to Date: 14
Plan Prescribed Dose Per Fraction: 2.67 Gy
Plan Total Fractions Prescribed: 15
Plan Total Prescribed Dose: 40.05 Gy
Reference Point Dosage Given to Date: 37.38 Gy
Reference Point Session Dosage Given: 2.67 Gy
Session Number: 14

## 2022-04-20 ENCOUNTER — Other Ambulatory Visit: Payer: Self-pay

## 2022-04-20 ENCOUNTER — Ambulatory Visit
Admission: RE | Admit: 2022-04-20 | Discharge: 2022-04-20 | Disposition: A | Discharge status: Home / Self Care | Payer: Medicare HMO | Source: Ambulatory Visit | Attending: Radiation Oncology | Admitting: Radiation Oncology

## 2022-04-20 DIAGNOSIS — Z170 Estrogen receptor positive status [ER+]: Secondary | ICD-10-CM | POA: Diagnosis not present

## 2022-04-20 DIAGNOSIS — Z510 Encounter for antineoplastic radiation therapy: Secondary | ICD-10-CM | POA: Diagnosis not present

## 2022-04-20 DIAGNOSIS — D0512 Intraductal carcinoma in situ of left breast: Secondary | ICD-10-CM | POA: Diagnosis not present

## 2022-04-20 LAB — RAD ONC ARIA SESSION SUMMARY
Course Elapsed Days: 21
Plan Fractions Treated to Date: 15
Plan Prescribed Dose Per Fraction: 2.67 Gy
Plan Total Fractions Prescribed: 15
Plan Total Prescribed Dose: 40.05 Gy
Reference Point Dosage Given to Date: 40.05 Gy
Reference Point Session Dosage Given: 2.67 Gy
Session Number: 15

## 2022-04-21 ENCOUNTER — Ambulatory Visit
Admission: RE | Admit: 2022-04-21 | Discharge: 2022-04-21 | Disposition: A | Discharge status: Home / Self Care | Payer: Medicare HMO | Source: Ambulatory Visit | Attending: Radiation Oncology | Admitting: Radiation Oncology

## 2022-04-21 ENCOUNTER — Ambulatory Visit: Payer: Medicare HMO

## 2022-04-21 ENCOUNTER — Other Ambulatory Visit: Payer: Self-pay

## 2022-04-21 DIAGNOSIS — Z170 Estrogen receptor positive status [ER+]: Secondary | ICD-10-CM | POA: Diagnosis not present

## 2022-04-21 DIAGNOSIS — Z510 Encounter for antineoplastic radiation therapy: Secondary | ICD-10-CM | POA: Diagnosis not present

## 2022-04-21 DIAGNOSIS — D0512 Intraductal carcinoma in situ of left breast: Secondary | ICD-10-CM | POA: Diagnosis not present

## 2022-04-21 LAB — RAD ONC ARIA SESSION SUMMARY
Course Elapsed Days: 22
Plan Fractions Treated to Date: 1
Plan Prescribed Dose Per Fraction: 2 Gy
Plan Total Fractions Prescribed: 5
Plan Total Prescribed Dose: 10 Gy
Reference Point Dosage Given to Date: 2 Gy
Reference Point Session Dosage Given: 2 Gy
Session Number: 16

## 2022-04-22 ENCOUNTER — Ambulatory Visit
Admission: RE | Admit: 2022-04-22 | Discharge: 2022-04-22 | Disposition: A | Discharge status: Home / Self Care | Payer: Medicare HMO | Source: Ambulatory Visit | Attending: Radiation Oncology | Admitting: Radiation Oncology

## 2022-04-22 ENCOUNTER — Other Ambulatory Visit: Payer: Self-pay

## 2022-04-22 ENCOUNTER — Ambulatory Visit: Payer: Medicare HMO

## 2022-04-22 DIAGNOSIS — D0512 Intraductal carcinoma in situ of left breast: Secondary | ICD-10-CM | POA: Diagnosis not present

## 2022-04-22 LAB — RAD ONC ARIA SESSION SUMMARY
Course Elapsed Days: 23
Plan Fractions Treated to Date: 2
Plan Prescribed Dose Per Fraction: 2 Gy
Plan Total Fractions Prescribed: 5
Plan Total Prescribed Dose: 10 Gy
Reference Point Dosage Given to Date: 4 Gy
Reference Point Session Dosage Given: 2 Gy
Session Number: 17

## 2022-04-23 ENCOUNTER — Ambulatory Visit: Payer: Medicare HMO

## 2022-04-23 ENCOUNTER — Ambulatory Visit
Admission: RE | Admit: 2022-04-23 | Discharge: 2022-04-23 | Disposition: A | Discharge status: Home / Self Care | Payer: Medicare HMO | Source: Ambulatory Visit | Attending: Radiation Oncology | Admitting: Radiation Oncology

## 2022-04-23 ENCOUNTER — Other Ambulatory Visit: Payer: Self-pay

## 2022-04-23 DIAGNOSIS — D0512 Intraductal carcinoma in situ of left breast: Secondary | ICD-10-CM | POA: Diagnosis not present

## 2022-04-23 LAB — RAD ONC ARIA SESSION SUMMARY
Course Elapsed Days: 24
Plan Fractions Treated to Date: 3
Plan Prescribed Dose Per Fraction: 2 Gy
Plan Total Fractions Prescribed: 5
Plan Total Prescribed Dose: 10 Gy
Reference Point Dosage Given to Date: 6 Gy
Reference Point Session Dosage Given: 2 Gy
Session Number: 18

## 2022-04-24 ENCOUNTER — Other Ambulatory Visit: Payer: Self-pay

## 2022-04-24 ENCOUNTER — Ambulatory Visit
Admission: RE | Admit: 2022-04-24 | Discharge: 2022-04-24 | Disposition: A | Discharge status: Home / Self Care | Payer: Medicare HMO | Source: Ambulatory Visit | Attending: Radiation Oncology | Admitting: Radiation Oncology

## 2022-04-24 DIAGNOSIS — D0512 Intraductal carcinoma in situ of left breast: Secondary | ICD-10-CM | POA: Insufficient documentation

## 2022-04-24 LAB — RAD ONC ARIA SESSION SUMMARY
Course Elapsed Days: 25
Plan Fractions Treated to Date: 4
Plan Prescribed Dose Per Fraction: 2 Gy
Plan Total Fractions Prescribed: 5
Plan Total Prescribed Dose: 10 Gy
Reference Point Dosage Given to Date: 8 Gy
Reference Point Session Dosage Given: 2 Gy
Session Number: 19

## 2022-04-27 ENCOUNTER — Ambulatory Visit
Admission: RE | Admit: 2022-04-27 | Discharge: 2022-04-27 | Disposition: A | Discharge status: Home / Self Care | Payer: Medicare HMO | Source: Ambulatory Visit | Attending: Radiation Oncology | Admitting: Radiation Oncology

## 2022-04-27 ENCOUNTER — Ambulatory Visit: Payer: Medicare HMO

## 2022-04-27 ENCOUNTER — Encounter: Payer: Self-pay | Admitting: *Deleted

## 2022-04-27 ENCOUNTER — Encounter: Payer: Self-pay | Admitting: Radiation Oncology

## 2022-04-27 ENCOUNTER — Other Ambulatory Visit: Payer: Self-pay

## 2022-04-27 ENCOUNTER — Ambulatory Visit: Payer: Medicare HMO | Admitting: Radiation Oncology

## 2022-04-27 DIAGNOSIS — Z170 Estrogen receptor positive status [ER+]: Secondary | ICD-10-CM | POA: Diagnosis not present

## 2022-04-27 DIAGNOSIS — Z510 Encounter for antineoplastic radiation therapy: Secondary | ICD-10-CM | POA: Diagnosis not present

## 2022-04-27 DIAGNOSIS — D0512 Intraductal carcinoma in situ of left breast: Secondary | ICD-10-CM

## 2022-04-27 LAB — RAD ONC ARIA SESSION SUMMARY
Course Elapsed Days: 28
Plan Fractions Treated to Date: 5
Plan Prescribed Dose Per Fraction: 2 Gy
Plan Total Fractions Prescribed: 5
Plan Total Prescribed Dose: 10 Gy
Reference Point Dosage Given to Date: 10 Gy
Reference Point Session Dosage Given: 2 Gy
Session Number: 20

## 2022-04-28 ENCOUNTER — Ambulatory Visit: Payer: Medicare HMO

## 2022-04-30 ENCOUNTER — Ambulatory Visit
Admission: RE | Admit: 2022-04-30 | Discharge: 2022-04-30 | Disposition: A | Discharge status: Home / Self Care | Payer: Medicare HMO | Source: Ambulatory Visit | Attending: Family Medicine | Admitting: Family Medicine

## 2022-04-30 DIAGNOSIS — M8589 Other specified disorders of bone density and structure, multiple sites: Secondary | ICD-10-CM | POA: Diagnosis not present

## 2022-04-30 DIAGNOSIS — Z780 Asymptomatic menopausal state: Secondary | ICD-10-CM | POA: Diagnosis not present

## 2022-05-05 ENCOUNTER — Encounter: Payer: Self-pay | Admitting: Hematology and Oncology

## 2022-05-05 ENCOUNTER — Other Ambulatory Visit: Payer: Self-pay

## 2022-05-05 ENCOUNTER — Inpatient Hospital Stay: Payer: Medicare HMO | Attending: Hematology and Oncology | Admitting: Hematology and Oncology

## 2022-05-05 DIAGNOSIS — Z79899 Other long term (current) drug therapy: Secondary | ICD-10-CM | POA: Diagnosis not present

## 2022-05-05 DIAGNOSIS — D0512 Intraductal carcinoma in situ of left breast: Secondary | ICD-10-CM

## 2022-05-05 DIAGNOSIS — Z803 Family history of malignant neoplasm of breast: Secondary | ICD-10-CM | POA: Diagnosis not present

## 2022-05-05 DIAGNOSIS — Z79810 Long term (current) use of selective estrogen receptor modulators (SERMs): Secondary | ICD-10-CM | POA: Insufficient documentation

## 2022-05-05 DIAGNOSIS — E039 Hypothyroidism, unspecified: Secondary | ICD-10-CM | POA: Diagnosis not present

## 2022-05-05 MED ORDER — TAMOXIFEN CITRATE 20 MG PO TABS: 20.0000 mg | ORAL_TABLET | Freq: Every day | ORAL | 3 refills | Status: DC

## 2022-05-05 NOTE — Assessment & Plan Note (Addendum)
This is a very pleasant 76 year old female patient with newly diagnosed left breast DCIS, ER/PR positive status post left lumpectomy who is here for an initial visit. We have discussed the following details about DCIS and antiestrogen therapy. She is now here after completing adj radiation, tolerating it well. She has underlying diagnosis of osteoporosis, most recent bone density with osteopenia.  She took Fosamax in the past for almost 5 years.  She is now on calcium and vitamin D supplementation.  We have also discussed about regular exercise she can continue to walk 2 miles a day.  She can also try some small weights as well as resistance exercises in the gym.  Today asked about role of antiestrogen therapy, options including tamoxifen versus aromatase inhibitors.  We have discussed about risk of DVT/PE with tamoxifen, endometrial hyperplasia, endometrial cancer, menopausal symptoms would benefit on bone density.  She is leaning towards tamoxifen at this time.  Dispenses to the pharmacy of her choice.  She will start it right after Christmas.  She will return to clinic in approximately 3 to 4 months for survivorship and toxicity check.  Mammogram ordered for August 2024. Thank you for consulting [place] in the care of this patient.  Please do not hesitate to contact us with any additional questions or concerns

## 2022-05-05 NOTE — Progress Notes (Signed)
[place] NOTE  Patient Care Team: Susy Frizzle, MD as PCP - General (Family Medicine) Martinique, Peter M, MD as PCP - Cardiology (Cardiology) Rockwell Germany, RN as Oncology Nurse Navigator Mauro Kaufmann, RN as Oncology Nurse Navigator Benay Pike, MD as Consulting Physician (Hematology and Oncology) Gery Pray, MD as Consulting Physician (Radiation Oncology) Jovita Kussmaul, MD as Consulting Physician (General Surgery)  CHIEF COMPLAINTS/PURPOSE OF CONSULTATION:  DCIS  HISTORY OF PRESENTING ILLNESS:  Maria Gomez 76 y.o. female is here because of recent diagnosis of left breast DCIS  I reviewed her records extensively and collaborated the history with the patient.  SUMMARY OF ONCOLOGIC HISTORY: Oncology History  Ductal carcinoma in situ (DCIS) of left breast  12/30/2021 Mammogram   Screening mammogram showed new calcifications in the left breast warranting further evaluation, no findings suspicious for malignancy in the right breast.  She then had a diagnostic mammogram as well as biopsy.   01/28/2022 Pathology Results   Left breast needle core biopsy showed DCIS, grade 2-3 out of 3, ER 100% positive strong staining PR 70% positive strong staining   02/16/2022 Initial Diagnosis   Ductal carcinoma in situ (DCIS) of left breast   02/18/2022 Definitive Surgery   Left breast lumpectomy on September 27 showed DCIS, intermediate to high nuclear grade, negative for invasive carcinoma, margins free of carcinoma fibrocystic changes.  Prognostics not repeated   Interval History  Since last visit, she is now status post adjuvant radiation. She tolerated this very well. She denies any complaints today. She is willing to try anti estrogen therapy.  She had bone density recently which showed osteopenia and she is now on ca/vit D supplementation. She took fosamax in the past for almost 5 yrs. Rest of the pertinent 10 point ROS reviewed and negative.    PMH and FH  At baseline, she has mitral valve prolapse, needed valvuloplasty, has hypothyroidism, otherwise considers herself healthy.  She has 1 child, used hormone replacement therapy briefly when she underwent menopause almost 25 years ago.  She does report some family history of breast cancer in maternal grandmother as well as maternal first cousin.  Maternal first cousin was older than her at the time of diagnosis and had bilateral mastectomy rest of the pertinent 10 point ROS reviewed and negative.  MEDICAL HISTORY:  Past Medical History:  Diagnosis Date   Cancer ([place]) 12/2021   left breast DCIS   Hypothyroidism    Mitral regurgitation    Osteopenia    S/P minimally invasive mitral valve repair 03/10/2018   Complex valvuloplasty including triangular resection of posterior leaflet, artificial Gore-tex neochord x6 and Size 30 Sorin Memo 4D Ring SN [id number] via right mini thoracotomy approach   Thyroid nodule 03/07/2018   Incidental - noted on CT scan, atypical cells found on fine-needle aspiration.  General surgery consultation and excision recommended February 2020    SURGICAL HISTORY: Past Surgical History:  Procedure Laterality Date   BREAST EXCISIONAL BIOPSY Bilateral    BREAST LUMPECTOMY WITH RADIOACTIVE SEED LOCALIZATION Left 02/18/2022   Procedure: LEFT BREAST LUMPECTOMY WITH RADIOACTIVE SEED LOCALIZATION;  Surgeon: Jovita Kussmaul, MD;  Location: [facility];  Service: General;  Laterality: Left;   MITRAL VALVE REPAIR Right 03/10/2018   Procedure: MINIMALLY INVASIVE MITRAL VALVE REPAIR (MVR):  -Triangular Resection of Posterior Leaflet P2; -Shortening Plasty of Posterior Leaflet P2; -Placement of Goretex Neo Chords x 6 using Chord-X system; -Ring Annuloplasty with a 30 mm  Sorin Memo 4D Ring;  Surgeon: Rexene Alberts, MD;  Location: [facility];  Service: Open Heart Surgery;  Laterality: Right;   RIGHT/LEFT HEART CATH AND CORONARY ANGIOGRAPHY N/A 01/18/2018   Procedure:  RIGHT/LEFT HEART CATH AND CORONARY ANGIOGRAPHY;  Surgeon: Martinique, Peter M, MD;  Location: [facility];  Service: Cardiovascular;  Laterality: N/A;   TEE WITHOUT CARDIOVERSION N/A 11/09/2017   Procedure: TRANSESOPHAGEAL ECHOCARDIOGRAM (TEE);  Surgeon: Larey Dresser, MD;  Location: [facility] ENDOSCOPY;  Service: Cardiovascular;  Laterality: N/A;   TEE WITHOUT CARDIOVERSION N/A 03/10/2018   Procedure: TRANSESOPHAGEAL ECHOCARDIOGRAM (TEE);  Surgeon: Rexene Alberts, MD;  Location: [facility];  Service: Open Heart Surgery;  Laterality: N/A;    SOCIAL HISTORY: Social History   Socioeconomic History   Marital status: Widowed    Spouse name: Not on file   Number of children: Not on file   Years of education: Not on file   Highest education level: Not on file  Occupational History   Not on file  Tobacco Use   Smoking status: Never   Smokeless tobacco: Never  Vaping Use   Vaping Use: Never used  Substance and Sexual Activity   Alcohol use: No   Drug use: No   Sexual activity: Not Currently    Birth control/protection: Post-menopausal  Other Topics Concern   Not on file  Social History Narrative   Lives with her mom.    Takes care of aunt who lives across the street.    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         Social Determinants of Health   Financial Resource Strain: Low Risk  (03/04/2022)   Overall Financial Resource Strain (CARDIA)    Difficulty of Paying Living Expenses: Not hard at all  Food Insecurity: No Food Insecurity (03/04/2022)   Hunger Vital Sign    Worried About Running Out of Food in the Last Year: Never true    Ran Out of Food in the Last Year: Never true  Transportation Needs: No Transportation Needs (03/04/2022)   PRAPARE - Hydrologist (Medical): No    Lack of Transportation (Non-Medical): No  Physical Activity: Insufficiently Active (11/06/2021)   Exercise Vital Sign    Days of Exercise per Week: 5  days    Minutes of Exercise per Session: 20 min  Stress: No Stress Concern Present (11/06/2021)   [place]    Feeling of Stress : Not at all  Social Connections: Moderately Integrated (11/06/2021)   Social Connection and Isolation Panel [NHANES]    Frequency of Communication with Friends and Family: More than three times a week    Frequency of Social Gatherings with Friends and Family: More than three times a week    Attends Religious Services: More than 4 times per year    Active Member of [employer] or Organizations: Yes    Attends Archivist Meetings: More than 4 times per year    Marital Status: Widowed  Intimate Partner Violence: Not At Risk (02/16/2022)   Humiliation, Afraid, Rape, and Kick questionnaire    Fear of Current or Ex-Partner: No    Emotionally Abused: No    Physically Abused: No    Sexually Abused: No    FAMILY HISTORY: Family History  Problem Relation Age of Onset   Heart disease Father    Cancer Brother 63  ColoRectal Cancer   Cancer Maternal Grandmother 75   Stroke Maternal Grandfather 76   Stroke Paternal Grandfather     ALLERGIES:  is allergic to niaspan [niacin er], phenazopyridine hcl, and statins.  MEDICATIONS:  Current Outpatient Medications  Medication Sig Dispense Refill   tamoxifen (NOLVADEX) 20 MG tablet Take 1 tablet (20 mg total) by mouth daily. 90 tablet 3   atorvastatin (LIPITOR) 20 MG tablet Take 1 tablet (20 mg total) by mouth daily. 90 tablet 3   Biotin 5000 MCG TABS Take 5,000 mcg by mouth daily.      Calcium Carb-Cholecalciferol (CALTRATE 600+D3) 600-20 MG-MCG TABS 2 tablet with a meal     Coenzyme Q10 (CO Q 10 PO) Take 800 mg by mouth daily.      diphenhydrAMINE HCl, Sleep, (UNISOM SLEEPGELS) 50 MG CAPS 1 capsule at bedtime as needed     Fiber Adult Gummies 2 g CHEW 2 tablets     levothyroxine (SYNTHROID) 25 MCG tablet Take 1 tablet (25 mcg total) by mouth  daily before breakfast. 90 tablet 0   Melatonin 10-10 MG TBCR 2 tablet at bedtime as needed     Multiple Vitamin (MULTIVITAMIN) tablet Take 1 tablet by mouth daily. Centrum Silver     Polyethylene Glycol 400 0.25 % SOLN Apply to eye.     vitamin B-12 (CYANOCOBALAMIN) 1000 MCG tablet Take 1,000 mcg by mouth daily.     zinc gluconate 50 MG tablet Take 50 mg by mouth daily.     No current facility-administered medications for this visit.    REVIEW OF SYSTEMS:   Constitutional: Denies fevers, chills or abnormal night sweats Eyes: Denies blurriness of vision, double vision or watery eyes Ears, nose, mouth, throat, and face: Denies mucositis or sore throat Respiratory: Denies cough, dyspnea or wheezes Cardiovascular: Denies palpitation, chest discomfort or lower extremity swelling Gastrointestinal:  Denies nausea, heartburn or change in bowel habits Skin: Denies abnormal skin rashes Lymphatics: Denies new lymphadenopathy or easy bruising Neurological:Denies numbness, tingling or new weaknesses Behavioral/Psych: Mood is stable, no new changes  Breast: Denies any palpable lumps or discharge All other systems were reviewed with the patient and are negative.  PHYSICAL EXAMINATION: ECOG PERFORMANCE STATUS: 0 - Asymptomatic  There were no vitals filed for this visit.  There were no vitals filed for this visit.  PE deferred in lieu of counseling  LABORATORY DATA:  I have reviewed the data as listed Lab Results  Component Value Date   WBC 8.6 03/09/2022   HGB 14.9 03/09/2022   HCT 43.8 03/09/2022   MCV 93.4 03/09/2022   PLT 296 03/09/2022   Lab Results  Component Value Date   NA 140 03/09/2022   K 4.8 03/09/2022   CL 105 03/09/2022   CO2 26 03/09/2022    RADIOGRAPHIC STUDIES: I have personally reviewed the radiological reports and agreed with the findings in the report.  ASSESSMENT AND PLAN:   Ductal carcinoma in situ (DCIS) of left breast This is a very pleasant  76 year old female patient with newly diagnosed left breast DCIS, ER/PR positive status post left lumpectomy who is here for an initial visit. We have discussed the following details about DCIS and antiestrogen therapy. She is now here after completing adj radiation, tolerating it well. She has underlying diagnosis of osteoporosis, most recent bone density with osteopenia.  She took Fosamax in the past for almost 5 years.  She is now on calcium and vitamin D supplementation.  We have also discussed about  regular exercise she can continue to walk 2 miles a day.  She can also try some small weights as well as resistance exercises in the gym.  Today asked about role of antiestrogen therapy, options including tamoxifen versus aromatase inhibitors.  We have discussed about risk of DVT/PE with tamoxifen, endometrial hyperplasia, endometrial cancer, menopausal symptoms would benefit on bone density.  She is leaning towards tamoxifen at this time.  Dispenses to the pharmacy of her choice.  She will start it right after Christmas.  She will return to clinic in approximately 3 to 4 months for survivorship and toxicity check.  Mammogram ordered for August 2024. Thank you for consulting [place] in the care of this patient.  Please do not hesitate to contact us with any additional questions or concerns  Total time spent: 30 minutes including history, review of records, counseling and coordination of care All questions were answered. The patient knows to call the clinic with any problems, questions or concerns.    Benay Pike, MD 05/05/22

## 2022-05-15 ENCOUNTER — Other Ambulatory Visit: Payer: Self-pay | Admitting: Family Medicine

## 2022-05-21 NOTE — Progress Notes (Incomplete)
Maria Gomez presents today for follow-up after completing radiation to her left breast on 04-27-22.   Pain: Skin:  ROM:  Lymphedema:  MedOnc F/U:  Other issues of note:   Pt reports Yes No Comments  Tamoxifen '[]'$  '[]'$    Letrozole '[]'$  '[]'$    Anastrazole '[]'$  '[]'$    Mammogram '[]'$  Date:  '[]'$ 

## 2022-05-26 ENCOUNTER — Encounter: Payer: Self-pay | Admitting: Radiation Oncology

## 2022-05-27 NOTE — Progress Notes (Incomplete)
  Radiation Oncology         ([phone]) [phone] ________________________________  Patient Name: Maria Gomez MRN: [id number] DOB: 04-07-1946 Referring Physician: Jenna Luo (Profile Not Attached) Date of Service: 04/27/2022 [facility], [place]                                                        End Of Treatment Note  Diagnoses: D05.12-Intraductal carcinoma in situ of left breast  Cancer Staging:  S/p lumpectomy: Stage 0 (cTis (DCIS), cN0, cM0) Left Breast, Intermediate to high-grade DCIS, ER+ / PR+ / Her2 not assessed   Intent: Curative  Radiation Treatment Dates: 03/30/2022 through 04/27/2022 Site Technique Total Dose (Gy) Dose per Fx (Gy) Completed Fx Beam Energies  Breast, Left: Breast_L_Bst 3D 10/10 2 5/5 6X   Narrative: The patient tolerated radiation therapy relatively well. During her final weekly treatment check on 04/21/22, the patient reported some dermatitis to the central left breast (managed with Radiaplex). She denied any other concerns or side effects. Physical exam performed on that same date showed some erythema and hyperpigmentation changes to the left breast area. Some radiation dermatitis was also appreciated but no skin breakdown.    Plan: The patient will follow-up with radiation oncology in one month .  ________________________________________________ -----------------------------------  Blair Promise, PhD, MD  This document serves as a record of services personally performed by Gery Pray, MD. It was created on his behalf by Roney Mans, a trained medical scribe. The creation of this record is based on the scribe's personal observations and the provider's statements to them. This document has been checked and approved by the attending provider.

## 2022-05-27 NOTE — Progress Notes (Signed)
Radiation Oncology         [phone]) [phone] ________________________________  Name: Maria Gomez MRN: [id number]  Date: 05/28/2022  DOB: April 10, 1946  Follow-Up Visit Note  CC: Maria Frizzle, MD  Maria Pike, MD    ICD-10-CM   1. Ductal carcinoma in situ (DCIS) of left breast  D05.12       Diagnosis: S/p lumpectomy: Stage 0 (cTis (DCIS), cN0, cM0) Left Breast, Intermediate to high-grade DCIS, ER+ / PR+ / Her2 not assessed    Interval Since Last Radiation: 1 month  Intent: Curative  Radiation Treatment Dates: 03/30/2022 through 04/27/2022 Site Technique Total Dose (Gy) Dose per Fx (Gy) Completed Fx Beam Energies  Breast, Left: Breast_L_Bst 3D 10/10 2 5/5 6X   Narrative:  The patient returns today for routine follow-up. The patient tolerated radiation therapy relatively well. During her final weekly treatment check on 04/21/22, the patient reported some dermatitis to the central left breast (managed with Radiaplex). She denied any other concerns or side effects. Physical exam performed on that same date showed some erythema and hyperpigmentation changes to the left breast area. Some radiation dermatitis was also appreciated but no skin breakdown.    Since completing XRT, the patient met Dr. Chryl Gomez on 05/05/22. During which time, the patient consented to proceeding with antiestrogen therapy consisting of tamoxifen. She began this shortly after Christmas.        Pertinent imaging/studies performed in the interval include a DXA for bone density on 04/30/22 which showed an AP spine t-score of -2.3; classifying the patient as osteopenic.            Today she reports to be doing well overall. She denies any breast pain or swelling, or any residual skin changes.                Allergies:  is allergic to niaspan [niacin er], phenazopyridine hcl, and statins.  Meds: Current Outpatient Medications  Medication Sig Dispense Refill   atorvastatin (LIPITOR) 20 MG tablet Take 1 tablet  (20 mg total) by mouth daily. 90 tablet 3   Biotin 5000 MCG TABS Take 5,000 mcg by mouth daily.      Calcium Carb-Cholecalciferol (CALTRATE 600+D3) 600-20 MG-MCG TABS 2 tablet with a meal     Coenzyme Q10 (CO Q 10 PO) Take 800 mg by mouth daily.      diphenhydrAMINE HCl, Sleep, (UNISOM SLEEPGELS) 50 MG CAPS 1 capsule at bedtime as needed     Fiber Adult Gummies 2 g CHEW 2 tablets     levothyroxine (SYNTHROID) 25 MCG tablet TAKE 1 TABLET BY MOUTH DAILY BEFORE BREAKFAST. 90 tablet 0   Melatonin 10-10 MG TBCR 2 tablet at bedtime as needed     Multiple Vitamin (MULTIVITAMIN) tablet Take 1 tablet by mouth daily. Centrum Silver     Polyethylene Glycol 400 0.25 % SOLN Apply to eye.     tamoxifen (NOLVADEX) 20 MG tablet Take 1 tablet (20 mg total) by mouth daily. 90 tablet 3   vitamin B-12 (CYANOCOBALAMIN) 1000 MCG tablet Take 1,000 mcg by mouth daily.     zinc gluconate 50 MG tablet Take 50 mg by mouth daily.     No current facility-administered medications for this encounter.    Physical Findings: The patient is in no acute distress. Patient is alert and oriented.  height is _0  (1.575 m) and weight is 152 lb 3.2 oz (69 kg). Her temperature is 97.7 F (36.5 C). Her blood pressure is 124/78 and  her pulse is 80. Her respiration is 20 and oxygen saturation is 100%. .   Lungs are clear to auscultation bilaterally. Heart has regular rate and rhythm. No palpable cervical, supraclavicular, or axillary adenopathy. Abdomen soft, non-tender, normal bowel sounds.  Right Breast: no palpable mass, nipple discharge or bleeding. Left Breast: No palpable mass. Well healed surgical incision around the left areola. No skin changes visualized.   Lab Findings: Lab Results  Component Value Date   WBC 8.6 03/09/2022   HGB 14.9 03/09/2022   HCT 43.8 03/09/2022   MCV 93.4 03/09/2022   PLT 296 03/09/2022    Radiographic Findings: DG Bone Density  Result Date: 04/30/2022 EXAM: DUAL X-RAY ABSORPTIOMETRY (DXA)  FOR BONE MINERAL DENSITY IMPRESSION: Referring Physician:  Susy Gomez Your patient completed a bone mineral density test using GE Lunar iDXA system (analysis version: 16). Technologist: lmn PATIENT: Name: Maria Gomez Patient ID: [id number] Birth Date: Jul 12, 1945 Height: 62.0 in. Sex: Female Measured: 04/30/2022 Weight: 149.0 lbs. Indications: Advanced Age, Breast Cancer History, Caucasian, Estrogen Deficient, Height Loss (781.91), Hypothyroid, Levothyroxine, Postmenopausal Fractures: None Treatments: Calcium (E943.0), Fosamax, Vitamin D (E933.5) ASSESSMENT: The BMD measured at AP Spine L1-L4 (L2,L3) is 0.890 g/cm2 with a T-score of -2.3. This patient is considered osteopenic/low bone mass according to [facility]) criteria. The quality of the exam is good. L2 and L3 were excluded due to degenerative changes as noted on previous exam. Site Region Measured Date Measured Age YA T-score BMD Significant CHANGE AP Spine L1-L4 (L2,L3) 4/30/2022 76.4 -2.3 0.890 g/cm2 AP Spine L1-L4 (L2,L3) 12/06/2019 74.0 -2.7 0.847 g/cm2 DualFemur Neck Left 04/30/2022 76.4 -2.2 0.732 g/cm2 DualFemur Neck Left 12/06/2019 74.0 -2.0 0.755 g/cm2 DualFemur Total Mean 04/30/2022 76.4 -1.7 0.800 g/cm2 DualFemur Total Mean 12/06/2019 74.0 -1.5 0.816 g/cm2 * World Health Organization [facility]) criteria for post-menopausal, Caucasian Women: Normal T-score at or above -1 SD Osteopenia T-score between -1 and -2.5 SD Osteoporosis T-score at or below -2.5 SD RECOMMENDATION: 1. All patients should optimize calcium and vitamin D intake. 2. Consider FDA-approved medical therapies in postmenopausal women and men aged 52 years and older, based on the following: a. A hip or vertebral (clinical or morphometric) fracture. b. T-score = -2.5 at the femoral neck or spine after appropriate evaluation to exclude secondary causes. c. Low bone mass (T-score between -1.0 and -2.5 at the femoral neck or spine) and a 10-year probability of a  hip fracture = 3% or a 10-year probability of a major osteoporosis-related fracture = 20% based on the [place]-adapted WHO algorithm. d. Clinician judgment and/or patient preferences may indicate treatment for people with 10-year fracture probabilities above or below these levels. FOLLOW-UP: Patients with diagnosis of osteoporosis or at high risk for fracture should have regular bone mineral density tests.? Patients eligible for Medicare are allowed routine testing every 2 years.? The testing frequency can be increased to one year for patients who have rapidly progressing disease, are receiving or discontinuing medical therapy to restore bone mass, or have additional risk factors. I have reviewed this study and agree with the findings. [facility] Electronically Signed   By: Franki Cabot M.D.   On: 04/30/2022 13:30    Impression:  S/p lumpectomy: Stage 0 (cTis (DCIS), cN0, cM0) Left Breast, Intermediate to high-grade DCIS, ER+ / PR+ / Her2 not assessed    The patient is recovering from the effects of radiation.  No evidence of disease recurrence on clinical exam today.   Plan: As needed follow-up.  Patient will continue to be followed by Dr. Chryl Gomez for antiestrogen therapy management. We are grateful for the opportunity to participate in this patient's care and she knows to call back with any questions.    ____________________________________   Leona Singleton, PA   Blair Promise, PhD, MD  This document serves as a record of services personally performed by Gery Pray, MD. It was created on his behalf by Roney Mans, a trained medical scribe. The creation of this record is based on the scribe's personal observations and the provider's statements to them. This document has been checked and approved by the attending provider.

## 2022-05-28 ENCOUNTER — Encounter: Payer: Self-pay | Admitting: Radiation Oncology

## 2022-05-28 ENCOUNTER — Other Ambulatory Visit: Payer: Self-pay

## 2022-05-28 ENCOUNTER — Ambulatory Visit
Admission: RE | Admit: 2022-05-28 | Discharge: 2022-05-28 | Disposition: A | Discharge status: Home / Self Care | Payer: Medicare HMO | Source: Ambulatory Visit | Attending: Radiation Oncology | Admitting: Radiation Oncology

## 2022-05-28 VITALS — BP 124/78 | HR 80 | Temp 97.7°F | Resp 20 | Ht 62.0 in | Wt 152.2 lb

## 2022-05-28 DIAGNOSIS — Z923 Personal history of irradiation: Secondary | ICD-10-CM | POA: Insufficient documentation

## 2022-05-28 DIAGNOSIS — Z79890 Hormone replacement therapy (postmenopausal): Secondary | ICD-10-CM | POA: Insufficient documentation

## 2022-05-28 DIAGNOSIS — D0512 Intraductal carcinoma in situ of left breast: Secondary | ICD-10-CM | POA: Insufficient documentation

## 2022-05-28 DIAGNOSIS — Z170 Estrogen receptor positive status [ER+]: Secondary | ICD-10-CM | POA: Diagnosis not present

## 2022-05-28 DIAGNOSIS — Z79899 Other long term (current) drug therapy: Secondary | ICD-10-CM | POA: Diagnosis not present

## 2022-05-28 HISTORY — DX: Personal history of irradiation: Z92.3

## 2022-05-28 NOTE — Progress Notes (Signed)
Maria Gomez is here today for follow up post radiation to the breast.   Breast Side:Left   They completed their radiation on: 04/27/22  Does the patient complain of any of the following: Post radiation skin issues: None Breast Tenderness: None Breast Swelling: None Lymphadema: None Range of Motion limitations: no issues Fatigue post radiation: none Appetite good/fair/poor: good  Additional comments if applicable:  Patient is currently taking Tamoxifen Vitals:   05/28/22 1046  BP: 124/78  Pulse: 80  Resp: 20  Temp: 97.7 F (36.5 C)  SpO2: 100%  Weight: 69 kg  Height: '5\' 2"'$  (1.575 m)

## 2022-06-02 NOTE — Radiation Completion Notes (Signed)
Patient Name: Maria Gomez, Maria Gomez MRN: [id number] Date of Birth: Nov 09, 1945 Referring Physician: Jenna Luo, M.D. Date of Service: 2022-06-02 Radiation Oncologist: Teryl Lucy, M.D. 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 Intent: Curative     ==========DELIVERED PLANS==========  First Treatment Date: 2022-03-30 - Last Treatment Date: 2022-04-27   Plan Name: Breast_L_BH Site: Breast, Left Technique: 3D Mode: Photon Dose Per Fraction: 2.67 Gy Prescribed Dose (Delivered / Prescribed): 40.05 Gy / 40.05 Gy Prescribed Fxs (Delivered / Prescribed): 15 / 15   Plan Name: Brst_L_BH_Bst Site: Breast, Left Technique: 3D Mode: Photon Dose Per Fraction: 2 Gy Prescribed Dose (Delivered / Prescribed): 10 Gy / 10 Gy Prescribed Fxs (Delivered / Prescribed): 5 / 5     ==========ON TREATMENT VISIT DATES========== 2022-04-01, 2022-04-07, 2022-04-14, 2022-04-21     ==========UPCOMING VISITS==========       ==========APPENDIX - ON TREATMENT VISIT NOTES==========   PatEd 2022-03-31 Ongoing education performed.   ImpPlan 2022-03-31 The patient is tolerating radiation. Continue treatment as planned.   PhysExam 2022-03-31 Alert, no acute distress.   PatEd 2022-04-01 Ongoing education performed.   ImpPlan 2022-04-01 The patient is tolerating radiation. Continue treatment as planned.   PhysExam 2022-04-01 Alert, no acute distress.   ProgNote 2022-04-01 Changes from last week/visit? [ No ] Pain? [ No ] Fatigue? [ No ] Skin irritation? [ No ] Using lotions? [ No ] Lymphedema? [ No ] Issues with ROM? [ No ] Need refills: [ No ] Additional  Weekly Progress Notes [  ]    RunningNotes 2022-04-01 Education and Radiaplex provided. [facility] 04/01/2022   PatEd 2022-04-07 Ongoing education performed.   ImpPlan 2022-04-07 The patient is  tolerating radiation. Continue treatment as planned.   PhysExam 2022-04-07 Alert, no acute distress.   ProgNote 2022-04-07 Changes from last week/visit? [ No ] Pain? [ No ] Fatigue? [ Yes ] Skin irritation? [ Yes- mild redness ] Using lotions? [ Yes ] Lymphedema? [ No ] Issues with ROM? [ No ] Need refills: [ No ] Additional  Weekly Progress Notes [ Reports swelling to left breast.  ]    PatEd 2022-04-14 Ongoing education performed.   ImpPlan 2022-04-14 The patient is tolerating radiation. Continue treatment as planned.   PhysExam 2022-04-14 Alert, no acute distress.   ProgNote 2022-04-14 Changes from last week/visit? [ No ] Pain? [ No ] Fatigue? [ Yes ] Skin irritation? [ Yes- itching, encouraged patient to apply hydrocortisone cream as needed.  ] Using lotions? [ Yes ] Lymphedema? [ No ] Issues with ROM? [ No ] Need refills: [ No ] Additional  Weekly Progress Notes [ Reports mild swelling to left breast. ]    PatEd 2022-04-15 Ongoing education performed.   PhysExam 2022-04-15 Alert, no acute distress.   ImpPlan 2022-04-15 The patient is tolerating radiation. Continue treatment as planned.   PatEd 2022-04-21 Ongoing education performed.   ImpPlan 2022-04-21 The patient is tolerating radiation. Continue treatment as planned.   PhysExam 2022-04-21 Alert, no acute distress.   ProgNote 2022-04-21 Changes from last week/visit? [ No ] Pain? [ No ] Fatigue? [ No ] Skin irritation? [ Yes has dermatitis on the central portion. ] Using lotions? [ Yes - radiaplex ] Lymphedema? [ No ] Issues with ROM? [  No ] Need refills: [ No ] Additional  Weekly Progress Notes [  ]

## 2022-06-11 ENCOUNTER — Other Ambulatory Visit: Payer: Self-pay | Admitting: Family Medicine

## 2022-06-22 NOTE — Progress Notes (Signed)
Cardiology Office Note    Date:  06/29/2022   ID:  Maria Gomez, Maria Gomez 12/17/1945, MRN [id number]  PCP:  Susy Frizzle, MD  Cardiologist:  Dr. Martinique  Chief Complaint  Patient presents with   Mitral Valve Prolapse    History of Present Illness:  Maria Gomez is a 77 y.o. female seen for followup Mitral insufficiency. She was intially evaluated on 11/03/17 for evaluation of a heart murmur. She has a  PMH of hypothyroidism and HLD but no prior cardiac history.  On the previous office visit with his primary care provider in March 2018, a heart murmur was heard.  Echocardiogram obtained on 09/01/2016 showed EF 60 to 65%, mild LVH with moderate focal basal septal hypertrophy, grade 1 DD, thickened mitral leaflet with posterior leaflet prolapse, mild MR, moderate LAE, PA peak pressure was 21 mmHg.  Carotid Doppler obtained on 09/04/2016 showed no significant disease bilaterally.  Echocardiogram was repeated on 09/20/2017, this showed EF 55 to 60%, bileaflet prolapse of mitral valve with at least moderate to severe MR directed anteriorly into the left atrium, PA peak pressure 35 mmHg. She subsequently underwent TEE showing bileaflet MVP with a flail P2 segment and severe MR. She subsequently underwent [facility] on 01/18/18 confirming severe MR. Normal coronary arteries. Normal right heart pressures.   She had minimally invasive mitral valve repair on 03/10/2018 by Dr. Roxy Manns. She did very well with this and repeat Echo showed resolution of her MR.   On follow up she denies any chest pain, dyspnea, edema, palpitations. She feels very well. She was diagnosed with early breast cancer and is s/p lumpectomy, RT and now tamoxifen. She is back up to walking 2 miles/day.  Past Medical History:  Diagnosis Date   Cancer ([place]) 12/2021   left breast DCIS   History of radiation therapy    Left breast- 03/30/22-04/27/22-Dr. Gery Pray   Hypothyroidism    Mitral regurgitation    Osteopenia     S/P minimally invasive mitral valve repair 03/10/2018   Complex valvuloplasty including triangular resection of posterior leaflet, artificial Gore-tex neochord x6 and Size 30 Sorin Memo 4D Ring SN [id number] via right mini thoracotomy approach   Thyroid nodule 03/07/2018   Incidental - noted on CT scan, atypical cells found on fine-needle aspiration.  General surgery consultation and excision recommended February 2020    Past Surgical History:  Procedure Laterality Date   BREAST EXCISIONAL BIOPSY Bilateral    BREAST LUMPECTOMY WITH RADIOACTIVE SEED LOCALIZATION Left 02/18/2022   Procedure: LEFT BREAST LUMPECTOMY WITH RADIOACTIVE SEED LOCALIZATION;  Surgeon: Jovita Kussmaul, MD;  Location: [place];  Service: General;  Laterality: Left;   MITRAL VALVE REPAIR Right 03/10/2018   Procedure: MINIMALLY INVASIVE MITRAL VALVE REPAIR (MVR):  -Triangular Resection of Posterior Leaflet P2; -Shortening Plasty of Posterior Leaflet P2; -Placement of Goretex Neo Chords x 6 using Chord-X system; -Ring Annuloplasty with a 30 mm Sorin Memo 4D Ring;  Surgeon: Rexene Alberts, MD;  Location: [place];  Service: Open Heart Surgery;  Laterality: Right;   RIGHT/LEFT HEART CATH AND CORONARY ANGIOGRAPHY N/A 01/18/2018   Procedure: RIGHT/LEFT HEART CATH AND CORONARY ANGIOGRAPHY;  Surgeon: Martinique, Aniylah Avans M, MD;  Location: [facility];  Service: Cardiovascular;  Laterality: N/A;   TEE WITHOUT CARDIOVERSION N/A 11/09/2017   Procedure: TRANSESOPHAGEAL ECHOCARDIOGRAM (TEE);  Surgeon: Larey Dresser, MD;  Location: [facility] ENDOSCOPY;  Service: Cardiovascular;  Laterality: N/A;   TEE WITHOUT CARDIOVERSION N/A 03/10/2018  Procedure: TRANSESOPHAGEAL ECHOCARDIOGRAM (TEE);  Surgeon: Rexene Alberts, MD;  Location: [place];  Service: Open Heart Surgery;  Laterality: N/A;    Current Medications: Outpatient Medications Prior to Visit  Medication Sig Dispense Refill   atorvastatin (LIPITOR) 20 MG tablet TAKE 1 TABLET BY  MOUTH EVERY DAY 60 tablet 0   Biotin 5000 MCG TABS Take 5,000 mcg by mouth daily.      Calcium Carb-Cholecalciferol (CALTRATE 600+D3) 600-20 MG-MCG TABS 2 tablet with a meal     Coenzyme Q10 (CO Q 10 PO) Take 800 mg by mouth daily.      diphenhydrAMINE HCl, Sleep, (UNISOM SLEEPGELS) 50 MG CAPS 1 capsule at bedtime as needed     Fiber Adult Gummies 2 g CHEW 2 tablets     levothyroxine (SYNTHROID) 25 MCG tablet TAKE 1 TABLET BY MOUTH DAILY BEFORE BREAKFAST. 90 tablet 0   Melatonin 10-10 MG TBCR 2 tablet at bedtime as needed     Multiple Vitamin (MULTIVITAMIN) tablet Take 1 tablet by mouth daily. Centrum Silver     Polyethylene Glycol 400 0.25 % SOLN Apply to eye.     tamoxifen (NOLVADEX) 20 MG tablet Take 1 tablet (20 mg total) by mouth daily. 90 tablet 3   vitamin B-12 (CYANOCOBALAMIN) 1000 MCG tablet Take 1,000 mcg by mouth daily.     zinc gluconate 50 MG tablet Take 50 mg by mouth daily.     No facility-administered medications prior to visit.     Allergies:   Niaspan [niacin er], Phenazopyridine hcl, and Statins   Social History   Socioeconomic History   Marital status: Widowed    Spouse name: Not on file   Number of children: Not on file   Years of education: Not on file   Highest education level: Not on file  Occupational History   Not on file  Tobacco Use   Smoking status: Never   Smokeless tobacco: Never  Vaping Use   Vaping Use: Never used  Substance and Sexual Activity   Alcohol use: No   Drug use: No   Sexual activity: Not Currently    Birth control/protection: Post-menopausal  Other Topics Concern   Not on file  Social History Narrative   Lives with her mom.    Takes care of aunt who lives across the street.    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         Social Determinants of Health   Financial Resource Strain: Low Risk  (03/04/2022)   Overall Financial Resource Strain (CARDIA)    Difficulty of Paying Living Expenses: Not hard  at all  Food Insecurity: No Food Insecurity (03/04/2022)   Hunger Vital Sign    Worried About Running Out of Food in the Last Year: Never true    Ran Out of Food in the Last Year: Never true  Transportation Needs: No Transportation Needs (03/04/2022)   PRAPARE - Hydrologist (Medical): No    Lack of Transportation (Non-Medical): No  Physical Activity: Insufficiently Active (11/06/2021)   Exercise Vital Sign    Days of Exercise per Week: 5 days    Minutes of Exercise per Session: 20 min  Stress: No Stress Concern Present (11/06/2021)   [place]    Feeling of Stress : Not at all  Social Connections: Moderately Integrated (11/06/2021)   Social Connection and Isolation Panel [NHANES]  Frequency of Communication with Friends and Family: More than three times a week    Frequency of Social Gatherings with Friends and Family: More than three times a week    Attends Religious Services: More than 4 times per year    Active Member of [employer] or Organizations: Yes    Attends Archivist Meetings: More than 4 times per year    Marital Status: Widowed     Family History:  The patient's family history includes Cancer (age of onset: 27) in her brother; Cancer (age of onset: 40) in her maternal grandmother; Heart disease in her father; Stroke in her paternal grandfather; Stroke (age of onset: 22) in her maternal grandfather.   ROS:   Please see the history of present illness.    ROS All other systems reviewed and are negative.   PHYSICAL EXAM:   VS:  BP 108/82   Pulse 80   Ht '5\' 2"'$  (1.575 m)   Wt 149 lb 6.4 oz (67.8 kg)   SpO2 98%   BMI 27.33 kg/m    GENERAL:  Well appearing overweight WF in NAD HEENT:  PERRL, EOMI, sclera are clear. Oropharynx is clear. NECK:  No jugular venous distention, carotid upstroke brisk and symmetric, no bruits, no thyromegaly or adenopathy LUNGS:  Clear to  auscultation bilaterally CHEST:  Unremarkable HEART:  RRR,  PMI not displaced or sustained,S1 and S2 within normal limits, no S3, no S4: no clicks, no rubs, no murmur ABD:  Soft, nontender. BS +, no masses or bruits. No hepatomegaly, no splenomegaly EXT:  2 + pulses throughout, no edema, no cyanosis no clubbing. Radial cath site has healed well. SKIN:  Warm and dry.  No rashes NEURO:  Alert and oriented x 3. Cranial nerves II through XII intact. PSYCH:  Cognitively intact    Wt Readings from Last 3 Encounters:  06/29/22 149 lb 6.4 oz (67.8 kg)  05/28/22 152 lb 3.2 oz (69 kg)  03/18/22 149 lb (67.6 kg)      Studies/Labs Reviewed:   EKG:  EKG is  ordered today.  NSR with LAD.  Otherwise normal. Rate 80. I have personally reviewed and interpreted this study.   Recent Labs: 03/09/2022: ALT 14; BUN 15; Creat 1.10; Hemoglobin 14.9; Platelets 296; Potassium 4.8; Sodium 140; TSH 3.42   Lipid Panel    Component Value Date/Time   CHOL 151 03/09/2022 0954   TRIG 232 (H) 03/09/2022 0954   HDL 53 03/09/2022 0954   CHOLHDL 2.8 03/09/2022 0954   VLDL 31 (H) 08/11/2016 0857   LDLCALC 69 03/09/2022 0954    Additional studies/ records that were reviewed today include:   Echo 09/01/2016 LV EF: 60% -   65% Study Conclusions   - Left ventricle: The cavity size was normal. Wall thickness was   increased in a pattern of mild LVH. There was moderate focal   basal hypertrophy of the septum. Systolic function was normal.   The estimated ejection fraction was in the range of 60% to 65%.   Wall motion was normal; there were no regional wall motion   abnormalities. Doppler parameters are consistent with abnormal   left ventricular relaxation (grade 1 diastolic dysfunction). The   E/e&' ratio is >15, suggesting elevated LV filling pressure. - Aortic valve: Trileaflet. Sclerosis without stenosis. There was   no regurgitation. - Mitral valve: Thickened leaflets with posterior leaflet prolapse.    Trace to mild regurgitation. - Left atrium: The atrium was moderately dilated. - Tricuspid  valve: There was trivial regurgitation. - Pulmonary arteries: PA peak pressure: 21 mm Hg (S). - Inferior vena cava: The vessel was normal in size. The   respirophasic diameter changes were in the normal range (>= 50%),   consistent with normal central venous pressure.   Impressions:   - LVEF 60-65%, mild LVH with moderate focal basal septal   hypertrophy, normal wall motion, grade 1 DD with elevated LV   filling pressure, aortic valve sclerosis, posterior mitral valve   leaflet prolapse with mild to moderate anteriorly directed mitral   regurgitation, moderate LAE, trivial TR, RVSP 21 mmHg, normal   IVC.   Echo 09/20/2017 LV EF: 55% -   60% Study Conclusions   - Left ventricle: The cavity size was normal. Wall thickness was   increased in a pattern of mild LVH. Systolic function was normal.   The estimated ejection fraction was in the range of 55% to 60%. - Mitral valve: There is bileaflet prolapse of the MV MR is   directed anteirorly into the left atrium IT is at least   moderately severe . - Left atrium: The atrium was mildly dilated. - Pulmonary arteries: PA peak pressure: 35 mm Hg (S).  TEE 11/09/17: Study Conclusions   - Left ventricle: The cavity size was normal. Wall thickness was   normal. The estimated ejection fraction was 60%. Wall motion was   normal; there were no regional wall motion abnormalities. - Aortic valve: There was no stenosis. There was trivial   regurgitation. - Aorta: Normal caliber aorta with minimal plaque. - Left atrium: The atrium was mildly dilated. No evidence of   thrombus in the atrial cavity or appendage. - Right ventricle: The cavity size was normal. Systolic function   was normal. - Right atrium: No evidence of thrombus in the atrial cavity or   appendage. - Atrial septum: No ASD/PFO by color doppler. - Tricuspid valve: Peak RV-RA gradient (S):  18 mm Hg. - Impressions: Severe mitral regurgitation in setting of bileaflet   MVP and partial flail P2 scallop.   Impressions:   - Severe mitral regurgitation in setting of bileaflet MVP and   partial flail P2 scallop.  Cardiac cath 01/18/18: Procedures   RIGHT/LEFT HEART CATH AND CORONARY ANGIOGRAPHY  Conclusion     The left ventricular systolic function is normal. LV end diastolic pressure is normal. The left ventricular ejection fraction is 55-65% by visual estimate. There is severe (4+) mitral regurgitation and moderate mitral valve prolapse.   1. Normal coronary anatomy 2. Normal LV function 3. Mitral valve prolapse with severe MR 3-4+ 4. Normal right heart pressures 5. Normal LV filling pressures   No indication for antiplatelet therapy at this time.    Will refer to Dr. Roxy Manns for consideration of MV repair   Minimally-Invasive Mitral Valve Repair             Complex valvuloplasty including triangular resection of flail segment (P2) of posterior leaflet             Shortening plasty of posterior leaflet             Artificial Gore-tex neochord placement x6             Sorin Memo 4D Ring Annuloplasty (size 59m, catalog # [id number], serial # [id number]   Echo 05/12/18: Study Conclusions   - Left ventricle: The cavity size was normal. Wall thickness was    normal. Systolic function was normal. The estimated ejection  fraction was in the range of 50% to 55%. Wall motion was normal;    there were no regional wall motion abnormalities. Doppler    parameters are consistent with abnormal left ventricular    relaxation (grade 1 diastolic dysfunction).  - Aortic valve: There was trivial regurgitation.  - Mitral valve: Prior procedures included surgical repair.   Impressions:   - Low normal LV systolic function; mild diastolic dysfunction;    trace AI; s/p MV repair with no MR.   ASSESSMENT:    1. Severe mitral valve regurgitation   2. S/P minimally invasive mitral  valve repair   3. Hyperlipidemia, unspecified hyperlipidemia type      PLAN:  In order of problems listed above:  Mitral valve regurgitation secondary to partially flail P2 segment of the MV with MV prolapse. Now s/p MV repair in October 2019. Follow up Echo showed excellent result. No murmur on exam  Hyperlipidemia: excellent control on lipitor.  Hypothyroidism: TSH normal.   Follow up in one year.      Medication Adjustments/Labs and Tests Ordered: Current medicines are reviewed at length with the patient today.  Concerns regarding medicines are outlined above.  Medication changes, Labs and Tests ordered today are listed in the Patient Instructions below. There are no Patient Instructions on file for this visit.   Signed, Tiffani Kadow Martinique, MD  06/29/2022 11:18 AM    [address], [place], [place]  [address] Phone: [phone]; Fax: [phone]

## 2022-06-29 ENCOUNTER — Encounter: Payer: Self-pay | Admitting: Cardiology

## 2022-06-29 ENCOUNTER — Ambulatory Visit: Payer: Medicare HMO | Attending: Cardiology | Admitting: Cardiology

## 2022-06-29 VITALS — BP 108/82 | HR 80 | Ht 62.0 in | Wt 149.4 lb

## 2022-06-29 DIAGNOSIS — E785 Hyperlipidemia, unspecified: Secondary | ICD-10-CM | POA: Diagnosis not present

## 2022-06-29 DIAGNOSIS — I340 Nonrheumatic mitral (valve) insufficiency: Secondary | ICD-10-CM

## 2022-06-29 DIAGNOSIS — Z9889 Other specified postprocedural states: Secondary | ICD-10-CM

## 2022-06-29 NOTE — Patient Instructions (Signed)
Medication Instructions:  Your physician recommends that you continue on your current medications as directed. Please refer to the Current Medication list given to you today.   *If you need a refill on your cardiac medications before your next appointment, please call your pharmacy*   Lab Work: NONE ordered at this time of appointment   If you have labs (blood work) drawn today and your tests are completely normal, you will receive your results only by: MyChart Message (if you have MyChart) OR A paper copy in the mail If you have any lab test that is abnormal or we need to change your treatment, we will call you to review the results.   Testing/Procedures: NONE ordered at this time of appointment     Follow-Up: At [facility], you and your health needs are our priority.  As part of our continuing mission to provide you with exceptional heart care, we have created designated Provider Care Teams.  These Care Teams include your primary Cardiologist (physician) and Advanced Practice Providers (APPs -  Physician Assistants and Nurse Practitioners) who all work together to provide you with the care you need, when you need it.  We recommend signing up for the patient portal called "MyChart".  Sign up information is provided on this After Visit Summary.  MyChart is used to connect with patients for Virtual Visits (Telemedicine).  Patients are able to view lab/test results, encounter notes, upcoming appointments, etc.  Non-urgent messages can be sent to your provider as well.   To learn more about what you can do with MyChart, go to [email or web address].    Your next appointment:   1 year(s)  Provider:   Peter Jordan, MD     Other Instructions   

## 2022-08-03 ENCOUNTER — Other Ambulatory Visit: Payer: Self-pay | Admitting: Family Medicine

## 2022-08-04 ENCOUNTER — Encounter: Payer: Self-pay | Admitting: Adult Health

## 2022-08-04 ENCOUNTER — Inpatient Hospital Stay: Payer: Medicare HMO | Attending: Hematology and Oncology | Admitting: Adult Health

## 2022-08-04 ENCOUNTER — Other Ambulatory Visit: Payer: Self-pay

## 2022-08-04 VITALS — BP 120/73 | HR 71 | Temp 97.8°F | Resp 18 | Ht 62.0 in | Wt 149.1 lb

## 2022-08-04 DIAGNOSIS — Z79810 Long term (current) use of selective estrogen receptor modulators (SERMs): Secondary | ICD-10-CM | POA: Diagnosis not present

## 2022-08-04 DIAGNOSIS — Z923 Personal history of irradiation: Secondary | ICD-10-CM | POA: Insufficient documentation

## 2022-08-04 DIAGNOSIS — Z79899 Other long term (current) drug therapy: Secondary | ICD-10-CM | POA: Diagnosis not present

## 2022-08-04 DIAGNOSIS — E039 Hypothyroidism, unspecified: Secondary | ICD-10-CM | POA: Diagnosis not present

## 2022-08-04 DIAGNOSIS — M8580 Other specified disorders of bone density and structure, unspecified site: Secondary | ICD-10-CM | POA: Insufficient documentation

## 2022-08-04 DIAGNOSIS — D0512 Intraductal carcinoma in situ of left breast: Secondary | ICD-10-CM | POA: Insufficient documentation

## 2022-08-04 NOTE — Progress Notes (Signed)
SURVIVORSHIP VISIT:    BRIEF ONCOLOGIC HISTORY:  Oncology History  Ductal carcinoma in situ (DCIS) of left breast  12/30/2021 Mammogram   Screening mammogram showed new calcifications in the left breast warranting further evaluation, no findings suspicious for malignancy in the right breast.  She then had a diagnostic mammogram as well as biopsy.   01/28/2022 Pathology Results   Left breast needle core biopsy showed DCIS, grade 2-3 out of 3, ER 100% positive strong staining PR 70% positive strong staining   02/16/2022 Initial Diagnosis   Ductal carcinoma in situ (DCIS) of left breast   02/18/2022 Definitive Surgery   Left breast lumpectomy on September 27 showed DCIS, intermediate to high nuclear grade, negative for invasive carcinoma, margins free of carcinoma fibrocystic changes.  Prognostics not repeated   03/30/2022 - 04/27/2022 Radiation Therapy   Site Technique Total Dose (Gy) Dose per Fx (Gy) Completed Fx Beam Energies  Breast, Left: Breast_L_Bst 3D 10/10 2 5/5 6X     04/2022 -  Anti-estrogen oral therapy   Tamoxifen x 5 years     INTERVAL HISTORY:  Maria Gomez to review her survivorship care plan detailing her treatment course for breast cancer, as well as monitoring long-term side effects of that treatment, education regarding health maintenance, screening, and overall wellness and health promotion.     Overall, Maria Gomez reports feeling quite well.  She is taking tamoxifen daily and tolerates it well.  She denies any significant issues.    REVIEW OF SYSTEMS:  Review of Systems  Constitutional:  Negative for appetite change, chills, fatigue, fever and unexpected weight change.  HENT:   Negative for hearing loss, lump/mass and trouble swallowing.   Eyes:  Negative for eye problems and icterus.  Respiratory:  Negative for chest tightness, cough and shortness of breath.   Cardiovascular:  Negative for chest pain, leg swelling and palpitations.  Gastrointestinal:  Negative  for abdominal distention, abdominal pain, constipation, diarrhea, nausea and vomiting.  Endocrine: Negative for hot flashes.  Genitourinary:  Negative for difficulty urinating.   Musculoskeletal:  Negative for arthralgias.  Skin:  Negative for itching and rash.  Neurological:  Negative for dizziness, extremity weakness, headaches and numbness.  Hematological:  Negative for adenopathy. Does not bruise/bleed easily.  Psychiatric/Behavioral:  Negative for depression. The patient is not nervous/anxious.   Breast: Denies any new nodularity, masses, tenderness, nipple changes, or nipple discharge.      PAST MEDICAL/SURGICAL HISTORY:  Past Medical History:  Diagnosis Date   Cancer ([place]) 12/2021   left breast DCIS   History of radiation therapy    Left breast- 03/30/22-04/27/22-Dr. Gery Pray   Hypothyroidism    Mitral regurgitation    Osteopenia    S/P minimally invasive mitral valve repair 03/10/2018   Complex valvuloplasty including triangular resection of posterior leaflet, artificial Gore-tex neochord x6 and Size 30 Sorin Memo 4D Ring SN [phone] via right mini thoracotomy approach   Thyroid nodule 03/07/2018   Incidental - noted on CT scan, atypical cells found on fine-needle aspiration.  General surgery consultation and excision recommended February 2020   Past Surgical History:  Procedure Laterality Date   BREAST EXCISIONAL BIOPSY Bilateral    BREAST LUMPECTOMY WITH RADIOACTIVE SEED LOCALIZATION Left 02/18/2022   Procedure: LEFT BREAST LUMPECTOMY WITH RADIOACTIVE SEED LOCALIZATION;  Surgeon: Jovita Kussmaul, MD;  Location: [facility];  Service: General;  Laterality: Left;   MITRAL VALVE REPAIR Right 03/10/2018   Procedure: MINIMALLY INVASIVE MITRAL VALVE REPAIR (MVR):  -Triangular  Resection of Posterior Leaflet P2; -Shortening Plasty of Posterior Leaflet P2; -Placement of Goretex Neo Chords x 6 using Chord-X system; -Ring Annuloplasty with a 30 mm Sorin Memo 4D Ring;   Surgeon: Rexene Alberts, MD;  Location: [facility];  Service: Open Heart Surgery;  Laterality: Right;   RIGHT/LEFT HEART CATH AND CORONARY ANGIOGRAPHY N/A 01/18/2018   Procedure: RIGHT/LEFT HEART CATH AND CORONARY ANGIOGRAPHY;  Surgeon: Martinique, Peter M, MD;  Location: [facility];  Service: Cardiovascular;  Laterality: N/A;   TEE WITHOUT CARDIOVERSION N/A 11/09/2017   Procedure: TRANSESOPHAGEAL ECHOCARDIOGRAM (TEE);  Surgeon: Larey Dresser, MD;  Location: [facility];  Service: Cardiovascular;  Laterality: N/A;   TEE WITHOUT CARDIOVERSION N/A 03/10/2018   Procedure: TRANSESOPHAGEAL ECHOCARDIOGRAM (TEE);  Surgeon: Rexene Alberts, MD;  Location: [facility];  Service: Open Heart Surgery;  Laterality: N/A;     ALLERGIES:  Allergies  Allergen Reactions   Niaspan [Niacin Er] Other (See Comments)    Severe flushing   Phenazopyridine Hcl Other (See Comments)    Made sick    Statins     Severe flushing     CURRENT MEDICATIONS:  Outpatient Encounter Medications as of 08/04/2022  Medication Sig   atorvastatin (LIPITOR) 20 MG tablet TAKE 1 TABLET BY MOUTH EVERY DAY   Biotin 5000 MCG TABS Take 5,000 mcg by mouth daily.    Calcium Carb-Cholecalciferol (CALTRATE 600+D3) 600-20 MG-MCG TABS 2 tablet with a meal   Coenzyme Q10 (CO Q 10 PO) Take 800 mg by mouth daily.    diphenhydrAMINE HCl, Sleep, (UNISOM SLEEPGELS) 50 MG CAPS 1 capsule at bedtime as needed   Fiber Adult Gummies 2 g CHEW 2 tablets   levothyroxine (SYNTHROID) 25 MCG tablet TAKE 1 TABLET BY MOUTH DAILY BEFORE BREAKFAST.   Melatonin 10-10 MG TBCR 2 tablet at bedtime as needed   Multiple Vitamin (MULTIVITAMIN) tablet Take 1 tablet by mouth daily. Centrum Silver   Polyethylene Glycol 400 0.25 % SOLN Apply to eye.   tamoxifen (NOLVADEX) 20 MG tablet Take 1 tablet (20 mg total) by mouth daily.   vitamin B-12 (CYANOCOBALAMIN) 1000 MCG tablet Take 1,000 mcg by mouth daily.   zinc gluconate 50 MG tablet Take 50 mg by mouth daily.    [DISCONTINUED] atorvastatin (LIPITOR) 20 MG tablet TAKE 1 TABLET BY MOUTH EVERY DAY   No facility-administered encounter medications on file as of 08/04/2022.     ONCOLOGIC FAMILY HISTORY:  Family History  Problem Relation Age of Onset   Heart disease Father    Cancer Brother 8       ColoRectal Cancer   Cancer Maternal Grandmother 86   Stroke Maternal Grandfather 76   Stroke Paternal Grandfather      SOCIAL HISTORY:  Social History   Socioeconomic History   Marital status: Widowed    Spouse name: Not on file   Number of children: Not on file   Years of education: Not on file   Highest education level: Not on file  Occupational History   Not on file  Tobacco Use   Smoking status: Never   Smokeless tobacco: Never  Vaping Use   Vaping Use: Never used  Substance and Sexual Activity   Alcohol use: No   Drug use: No   Sexual activity: Not Currently    Birth control/protection: Post-menopausal  Other Topics Concern   Not on file  Social History Narrative   Lives with her mom.    Takes care of aunt who lives  across the street.    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         Social Determinants of Health   Financial Resource Strain: Low Risk  (03/04/2022)   Overall Financial Resource Strain (CARDIA)    Difficulty of Paying Living Expenses: Not hard at all  Food Insecurity: No Food Insecurity (03/04/2022)   Hunger Vital Sign    Worried About Running Out of Food in the Last Year: Never true    Ran Out of Food in the Last Year: Never true  Transportation Needs: No Transportation Needs (03/04/2022)   PRAPARE - Hydrologist (Medical): No    Lack of Transportation (Non-Medical): No  Physical Activity: Insufficiently Active (11/06/2021)   Exercise Vital Sign    Days of Exercise per Week: 5 days    Minutes of Exercise per Session: 20 min  Stress: No Stress Concern Present (11/06/2021)   [place]    Feeling of Stress : Not at all  Social Connections: Moderately Integrated (11/06/2021)   Social Connection and Isolation Panel [NHANES]    Frequency of Communication with Friends and Family: More than three times a week    Frequency of Social Gatherings with Friends and Family: More than three times a week    Attends Religious Services: More than 4 times per year    Active Member of [employer] or Organizations: Yes    Attends Archivist Meetings: More than 4 times per year    Marital Status: Widowed  Intimate Partner Violence: Not At Risk (02/16/2022)   Humiliation, Afraid, Rape, and Kick questionnaire    Fear of Current or Ex-Partner: No    Emotionally Abused: No    Physically Abused: No    Sexually Abused: No     OBSERVATIONS/OBJECTIVE:  BP 120/73 (BP Location: Left Arm, Patient Position: Sitting)   Pulse 71   Temp 97.8 F (36.6 C) (Temporal)   Resp 18   Ht '5\' 2"'$  (1.575 m)   Wt 149 lb 1.6 oz (67.6 kg)   SpO2 100%   BMI 27.27 kg/m  GENERAL: Patient is a well appearing female in no acute distress HEENT:  Sclerae anicteric.  Oropharynx clear and moist. No ulcerations or evidence of oropharyngeal candidiasis. Neck is supple.  NODES:  No cervical, supraclavicular, or axillary lymphadenopathy palpated.  BREAST EXAM: Left breast status postlumpectomy and radiation no sign of local recurrence right breast status postlumpectomy, and benign. LUNGS:  Clear to auscultation bilaterally.  No wheezes or rhonchi. HEART:  Regular rate and rhythm. No murmur appreciated. ABDOMEN:  Soft, nontender.  Positive, normoactive bowel sounds. No organomegaly palpated. MSK:  No focal spinal tenderness to palpation. Full range of motion bilaterally in the upper extremities. EXTREMITIES:  No peripheral edema.   SKIN:  Clear with no obvious rashes or skin changes. No nail dyscrasia. NEURO:  Nonfocal. Well oriented.  Appropriate  affect.   LABORATORY DATA:  None for this visit.  DIAGNOSTIC IMAGING:  None for this visit.      ASSESSMENT AND PLAN:  Ms.. Gomez is a pleasant 77 y.o. female with Stage 0 left breast DCIS, ER+/PR+, diagnosed in September 2023, treated with lumpectomy, adjuvant radiation therapy, and anti-estrogen therapy with tamoxifen beginning in December 2023.  She presents to the Survivorship Clinic for our initial meeting and routine follow-up post-completion of treatment for breast cancer.    1. Stage  0 left breast cancer:  Maria Gomez is continuing to recover from definitive treatment for breast cancer. She will follow-up with her medical oncologist, Dr. Chryl Heck in September 2024 with history and physical exam per surveillance protocol.  She will continue her anti-estrogen therapy with tamoxifen. Thus far, she is tolerating the tamoxifen well, with minimal side effects.  Her mammogram is due August 2024 and this is already been scheduled.  Today, a comprehensive survivorship care plan and treatment summary was reviewed with the patient today detailing her breast cancer diagnosis, treatment course, potential late/long-term effects of treatment, appropriate follow-up care with recommendations for the future, and patient education resources.  A copy of this summary, along with a letter will be sent to the patient's primary care provider via mail/fax/In Basket message after today's visit.    2. Bone health:  Given Maria Gomez's age/history of breast cancer she is at risk for bone demineralization.  Her last DEXA scan was 04/30/2022, which showed osteopenia with a T-score -2.3.  She was recommended to repeat this in 2 years time and I counseled her that tamoxifen has a protective effect on the bones which is good. She was given education on specific activities to promote bone health.  3. Cancer screening:  Due to Maria Gomez history and her age, she should receive screening for skin cancers.  The  information and recommendations are listed on the patient's comprehensive care plan/treatment summary and were reviewed in detail with the patient.    4. Health maintenance and wellness promotion: Maria Gomez was encouraged to consume 5-7 servings of fruits and vegetables per day. We reviewed the "Nutrition Rainbow" handout.  She was also encouraged to engage in moderate to vigorous exercise for 30 minutes per day most days of the week.  She was instructed to limit her alcohol consumption and continue to abstain from tobacco use.     5. Support services/counseling: It is not uncommon for this period of the patient's cancer care trajectory to be one of many emotions and stressors.    She was given information regarding our available services and encouraged to contact me with any questions or for help enrolling in any of our support group/programs.    Follow up instructions:    -Return to cancer center in September 2024 for follow-up with Dr. Chryl Heck -Mammogram due in August 2024 -Bone density testing in December 2025 -Follow up with surgery April 2024 -She is welcome to return back to the Survivorship Clinic at any time; no additional follow-up needed at this time.  -Consider referral back to survivorship as a long-term survivor for continued surveillance  The patient was provided an opportunity to ask questions and all were answered. The patient agreed with the plan and demonstrated an understanding of the instructions.   Total encounter time:40 minutes*in face-to-face visit time, chart review, lab review, care coordination, order entry, and documentation of the encounter time.    Maria Bihari, NP 08/04/22 12:34 PM Medical Oncology and Hematology [facility] [place], [place] [address] Tel. [phone]    Fax. [phone]  *Total Encounter Time as defined by the Centers for Medicare and Medicaid Services includes, in addition to the face-to-face time of a  patient visit (documented in the note above) non-face-to-face time: obtaining and reviewing outside history, ordering and reviewing medications, tests or procedures, care coordination (communications with other health care professionals or caregivers) and documentation in the medical record.

## 2022-08-05 ENCOUNTER — Encounter: Payer: Self-pay | Admitting: Radiation Oncology

## 2022-08-05 NOTE — Progress Notes (Incomplete)
  Radiation Oncology         [phone] ________________________________  Patient Name: Maria Gomez MRN: [id number] DOB: Nov 06, 1945 Referring Physician: Jenna Luo (Profile Not Attached) Date of Service: 04/27/2022 [facility], [place]   End Of Treatment Note  Diagnoses: D05.12-Intraductal carcinoma in situ of left breast  Cancer Staging:  S/p lumpectomy: Stage 0 (cTis (DCIS), cN0, cM0) Left Breast, Intermediate to high-grade DCIS, ER+ / PR+ / Her2 not assessed   Intent: Curative  Radiation Treatment Dates: 03/30/2022 through 04/27/2022  Left breast: treated with a total of 40.05 Gy in 15 Fx, with 2.67 Gy/Fx Left Breast Boost: treated with a total of 10.0 Gy in 5 Fx, with 2 Gy/Fx Technique: 3D Beams/Energies: 6x  Narrative: The patient tolerated radiation therapy relatively well. During her final weekly treatment check on 04/21/22, the patient reported some dermatitis to the central left breast (managed with Radiaplex). She denied any other concerns or side effects. Physical exam performed on that same date showed some erythema and hyperpigmentation changes to the left breast area. Some radiation dermatitis was also appreciated but no skin breakdown.   Plan: The patient will follow-up with radiation oncology in one month .  -----------------------------------  Blair Promise, PhD, MD  This document serves as a record of services personally performed by Gery Pray, MD. It was created on his behalf by Roney Mans, a trained medical scribe. The creation of this record is based on the scribe's personal observations and the provider's statements to them. This document has been checked and approved by the attending provider.

## 2022-08-10 ENCOUNTER — Other Ambulatory Visit: Payer: Self-pay | Admitting: Family Medicine

## 2022-09-16 ENCOUNTER — Ambulatory Visit (INDEPENDENT_AMBULATORY_CARE_PROVIDER_SITE_OTHER): Payer: Medicare HMO | Admitting: Nurse Practitioner

## 2022-09-16 ENCOUNTER — Encounter: Payer: Self-pay | Admitting: Nurse Practitioner

## 2022-09-16 VITALS — BP 110/78 | HR 77 | Temp 98.1°F | Resp 16 | Ht 62.0 in | Wt 148.5 lb

## 2022-09-16 DIAGNOSIS — I340 Nonrheumatic mitral (valve) insufficiency: Secondary | ICD-10-CM | POA: Diagnosis not present

## 2022-09-16 DIAGNOSIS — Z0000 Encounter for general adult medical examination without abnormal findings: Secondary | ICD-10-CM | POA: Insufficient documentation

## 2022-09-16 DIAGNOSIS — E039 Hypothyroidism, unspecified: Secondary | ICD-10-CM | POA: Diagnosis not present

## 2022-09-16 DIAGNOSIS — M810 Age-related osteoporosis without current pathological fracture: Secondary | ICD-10-CM | POA: Diagnosis not present

## 2022-09-16 DIAGNOSIS — Z7689 Persons encountering health services in other specified circumstances: Secondary | ICD-10-CM | POA: Diagnosis not present

## 2022-09-16 DIAGNOSIS — D0512 Intraductal carcinoma in situ of left breast: Secondary | ICD-10-CM

## 2022-09-16 NOTE — Assessment & Plan Note (Signed)
Patient was followed by Dr. Tanya Nones.  Brief EMR review was performed

## 2022-09-16 NOTE — Assessment & Plan Note (Signed)
Patient is status postlumpectomy and currently followed by Dr. Al Pimple.  Recently placed on tamoxifen continue taking medication as prescribed follow-up with oncology as recommended.  Patient's next mammogram is scheduled for August 2024

## 2022-09-16 NOTE — Patient Instructions (Addendum)
Nice to see you today I want to do your physical and labs in the next 4-5 months. Follow up sooner if you need me

## 2022-09-16 NOTE — Assessment & Plan Note (Signed)
History of the same.  Patient was on a "medication for an extended period of time" was taken off.  Currently on Caltrate 2 tablets daily along with 2000 IUs of vitamin D.  Last DEXA scan was December 2023

## 2022-09-16 NOTE — Assessment & Plan Note (Signed)
Patient followed by Peter [place], MD cardiologist.  Status post minimally invasive valve repair from clarance Cornelius Moras, MD.  Follow-up with cardiology as recommended

## 2022-09-16 NOTE — Assessment & Plan Note (Signed)
Patient currently maintained on levothyroxine 25 mcg daily.  Tolerates medication well.  Last TSH within normal limits.  Continue medication as prescribed patient denies needing refills currently

## 2022-09-16 NOTE — Progress Notes (Signed)
New Patient Office Visit  Subjective    Patient ID: Maria Gomez, female    DOB: 1945-10-12  Age: 77 y.o. MRN: [id number]  CC:  Chief Complaint  Patient presents with   Establish Care    HPI Maria Gomez presents to establish care  Was a patient of Lynnea Ferrier Last Medicare wellness exam was 12/2020  Osteoporosis: was on a medication for an extended period of time and came off for over a year. States that she is doing caltrate 2 and vitamin 1000 daily   Hypothyroidism: tolerates medication well and take correctly  Breast cancer: Dr Burnice Logan Iruku. States that she does see her in 6 months. Recently started on the tamoxifen   Mitral regurgitation: Patient is followed by Peter Swaziland, MD cardiologist every February once a year per patient report.  HLD: atorvastatin tolerates well    Tdap:last one 2013 Flu: UTd this season Covid: pfizer Pna: UTD  Colonoscopy: 2015 repeat in 5 years. Going on Monday for a follow up with [facility] states her whole GI doctor has retired she does feel like she had a colonoscopy in 2020 as she has been on top of it since her brother has that colorectal cancer diagnosis. Mammogram Pap smear Outpatient Encounter Medications as of 09/16/2022  Medication Sig   atorvastatin (LIPITOR) 20 MG tablet TAKE 1 TABLET BY MOUTH EVERY DAY   Biotin 5000 MCG TABS Take 5,000 mcg by mouth daily.    Calcium Carb-Cholecalciferol (CALTRATE 600+D3) 600-20 MG-MCG TABS 2 tablet with a meal   Coenzyme Q10 (CO Q 10 PO) Take 800 mg by mouth daily.    diphenhydrAMINE HCl, Sleep, (UNISOM SLEEPGELS) 50 MG CAPS 1 capsule at bedtime as needed   Fiber Adult Gummies 2 g CHEW 2 tablets   levothyroxine (SYNTHROID) 25 MCG tablet TAKE 1 TABLET BY MOUTH EVERY DAY BEFORE BREAKFAST   Melatonin 10-10 MG TBCR 2 tablet at bedtime as needed   Multiple Vitamin (MULTIVITAMIN) tablet Take 1 tablet by mouth daily. Centrum Silver   Polyethylene Glycol 400 0.25 % SOLN  Apply to eye.   polyethylene glycol powder (GLYCOLAX/MIRALAX) 17 GM/SCOOP powder Take 1 Container by mouth once.   tamoxifen (NOLVADEX) 20 MG tablet Take 1 tablet (20 mg total) by mouth daily.   vitamin B-12 (CYANOCOBALAMIN) 1000 MCG tablet Take 1,000 mcg by mouth daily.   zinc gluconate 50 MG tablet Take 50 mg by mouth daily.   No facility-administered encounter medications on file as of 09/16/2022.    Past Medical History:  Diagnosis Date   Cancer 12/2021   left breast DCIS   History of radiation therapy    Left breast- 03/30/22-04/27/22-Dr. Antony Blackbird   Hypothyroidism    Mitral regurgitation    Osteopenia    S/P minimally invasive mitral valve repair 03/10/2018   Complex valvuloplasty including triangular resection of posterior leaflet, artificial Gore-tex neochord x6 and Size 30 Sorin Memo 4D Ring SN [id number] via right mini thoracotomy approach   Thyroid nodule 03/07/2018   Incidental - noted on CT scan, atypical cells found on fine-needle aspiration.  General surgery consultation and excision recommended February 2020    Past Surgical History:  Procedure Laterality Date   BREAST EXCISIONAL BIOPSY Bilateral    BREAST LUMPECTOMY WITH RADIOACTIVE SEED LOCALIZATION Left 02/18/2022   Procedure: LEFT BREAST LUMPECTOMY WITH RADIOACTIVE SEED LOCALIZATION;  Surgeon: Griselda Miner, MD;  Location: [facility];  Service: General;  Laterality: Left;   MITRAL VALVE REPAIR Right  03/10/2018   Procedure: MINIMALLY INVASIVE MITRAL VALVE REPAIR (MVR):  -Triangular Resection of Posterior Leaflet P2; -Shortening Plasty of Posterior Leaflet P2; -Placement of Goretex Neo Chords x 6 using Chord-X system; -Ring Annuloplasty with a 30 mm Sorin Memo 4D Ring;  Surgeon: Purcell Nails, MD;  Location: [facility] OR;  Service: Open Heart Surgery;  Laterality: Right;   RIGHT/LEFT HEART CATH AND CORONARY ANGIOGRAPHY N/A 01/18/2018   Procedure: RIGHT/LEFT HEART CATH AND CORONARY ANGIOGRAPHY;  Surgeon:  Swaziland, Peter M, MD;  Location: [facility] INVASIVE CV LAB;  Service: Cardiovascular;  Laterality: N/A;   TEE WITHOUT CARDIOVERSION N/A 11/09/2017   Procedure: TRANSESOPHAGEAL ECHOCARDIOGRAM (TEE);  Surgeon: Laurey Morale, MD;  Location: [facility] ENDOSCOPY;  Service: Cardiovascular;  Laterality: N/A;   TEE WITHOUT CARDIOVERSION N/A 03/10/2018   Procedure: TRANSESOPHAGEAL ECHOCARDIOGRAM (TEE);  Surgeon: Purcell Nails, MD;  Location: [facility] OR;  Service: Open Heart Surgery;  Laterality: N/A;    Family History  Problem Relation Age of Onset   Dementia Mother    Heart disease Father    Cancer Brother 44       ColoRectal Cancer   Cancer Brother 72       prostates to bone metz   Cancer Maternal Grandmother 86   Stroke Maternal Grandfather 51   Stroke Paternal Grandfather     Social History   Socioeconomic History   Marital status: Widowed    Spouse name: Not on file   Number of children: 1   Years of education: Not on file   Highest education level: Not on file  Occupational History   Not on file  Tobacco Use   Smoking status: Never   Smokeless tobacco: Never  Vaping Use   Vaping Use: Never used  Substance and Sexual Activity   Alcohol use: No   Drug use: No   Sexual activity: Not Currently    Birth control/protection: Post-menopausal  Other Topics Concern   Not on file  Social History Narrative   Stays alone. Mother in memory care unit will be 66      Bryan (50) bio son      Clydie Braun (16)   Greg (56)      Hobbies: was taking care of her mother. Taking care of the house. Walks 2 miles a day She does have a 3 are yard that she mows weeds   Social Determinants of Health   Financial Resource Strain: Low Risk  (03/04/2022)   Overall Financial Resource Strain (CARDIA)    Difficulty of Paying Living Expenses: Not hard at all  Food Insecurity: No Food Insecurity (03/04/2022)   Hunger Vital Sign    Worried About Running Out of Food in the Last Year: Never true    Ran Out of Food in the  Last Year: Never true  Transportation Needs: No Transportation Needs (03/04/2022)   PRAPARE - Administrator, Civil Service (Medical): No    Lack of Transportation (Non-Medical): No  Physical Activity: Insufficiently Active (11/06/2021)   Exercise Vital Sign    Days of Exercise per Week: 5 days    Minutes of Exercise per Session: 20 min  Stress: No Stress Concern Present (11/06/2021)   [employer] of Occupational Health - Occupational Stress Questionnaire    Feeling of Stress : Not at all  Social Connections: Moderately Integrated (11/06/2021)   Social Connection and Isolation Panel [NHANES]    Frequency of Communication with Friends and Family: More than three times a week  Frequency of Social Gatherings with Friends and Family: More than three times a week    Attends Religious Services: More than 4 times per year    Active Member of Clubs or Organizations: Yes    Attends Banker Meetings: More than 4 times per year    Marital Status: Widowed  Intimate Partner Violence: Not At Risk (02/16/2022)   Humiliation, Afraid, Rape, and Kick questionnaire    Fear of Current or Ex-Partner: No    Emotionally Abused: No    Physically Abused: No    Sexually Abused: No    Review of Systems  Constitutional:  Negative for chills and fever.  Respiratory:  Negative for shortness of breath.   Cardiovascular:  Negative for chest pain.  Neurological:  Negative for headaches.  Psychiatric/Behavioral:  Negative for hallucinations and suicidal ideas.         Objective    BP 110/78   Pulse 77   Temp 98.1 F (36.7 C)   Resp 16   Ht  (1.575 m)   Wt 148 lb 8 oz (67.4 kg)   SpO2 99%   BMI 27.16 kg/m   Physical Exam Vitals and nursing note reviewed.  Constitutional:      Appearance: Normal appearance.  HENT:     Right Ear: Tympanic membrane, ear canal and external ear normal.     Left Ear: Tympanic membrane, ear canal and external ear normal.      Mouth/Throat:     Mouth: Mucous membranes are moist.     Pharynx: Oropharynx is clear.  Eyes:     Extraocular Movements: Extraocular movements intact.     Pupils: Pupils are equal, round, and reactive to light.  Cardiovascular:     Rate and Rhythm: Normal rate and regular rhythm.     Pulses: Normal pulses.     Heart sounds: Normal heart sounds.  Pulmonary:     Effort: Pulmonary effort is normal.     Breath sounds: Normal breath sounds.  Musculoskeletal:     Right lower leg: No edema.     Left lower leg: No edema.  Lymphadenopathy:     Cervical: No cervical adenopathy.  Skin:    General: Skin is warm.  Neurological:     General: No focal deficit present.     Mental Status: She is alert.     Deep Tendon Reflexes:     Reflex Scores:      Bicep reflexes are 2+ on the right side and 2+ on the left side.      Patellar reflexes are 2+ on the right side and 2+ on the left side.    Comments: Bilateral upper and lower extremity strength 5/5  Psychiatric:        Mood and Affect: Mood normal.        Behavior: Behavior normal.        Thought Content: Thought content normal.        Judgment: Judgment normal.         Assessment & Plan:   Problem List Items Addressed This Visit       Cardiovascular and Mediastinum   Non-rheumatic mitral regurgitation - Primary    Patient followed by Peter Swaziland, MD cardiologist.  Status post minimally invasive valve repair from clarance Cornelius Moras, MD.  Follow-up with cardiology as recommended        Endocrine   Hypothyroidism    Patient currently maintained on levothyroxine 25 mcg daily.  Tolerates medication well.  Last TSH within normal limits.  Continue medication as prescribed patient denies needing refills currently        Musculoskeletal and Integument   Osteoporosis    History of the same.  Patient was on a "medication for an extended period of time" was taken off.  Currently on Caltrate 2 tablets daily along with 2000 IUs of vitamin D.   Last DEXA scan was December 2023        Other   Ductal carcinoma in situ (DCIS) of left breast    Patient is status postlumpectomy and currently followed by Dr. Al Pimple.  Recently placed on tamoxifen continue taking medication as prescribed follow-up with oncology as recommended.  Patient's next mammogram is scheduled for August 2024      Establishing care with new doctor, encounter for    Patient was followed by Dr. Tanya Nones.  Brief EMR review was performed       Return in about 4 months (around 01/16/2023) for CPE and Labs.   [name], NP

## 2022-09-21 DIAGNOSIS — Z86010 Personal history of colonic polyps: Secondary | ICD-10-CM | POA: Diagnosis not present

## 2022-09-21 DIAGNOSIS — K5900 Constipation, unspecified: Secondary | ICD-10-CM | POA: Diagnosis not present

## 2022-09-22 DIAGNOSIS — D0512 Intraductal carcinoma in situ of left breast: Secondary | ICD-10-CM | POA: Diagnosis not present

## 2022-11-02 ENCOUNTER — Telehealth: Payer: Self-pay | Admitting: Nurse Practitioner

## 2022-11-02 NOTE — Telephone Encounter (Signed)
Prescription Request  11/02/2022  LOV: 09/16/2022  What is the name of the medication or equipment? levothyroxine (SYNTHROID) 25 MCG tablet   Have you contacted your pharmacy to request a refill? Yes   Which pharmacy would you like this sent to?  [id number], [place] - [address] [place] [address] Phone: [phone] Fax: [phone]    Patient notified that their request is being sent to the clinical staff for review and that they should receive a response within 2 business days.   Please advise at [facility] [phone]   [facility] [place] going forward

## 2022-11-03 ENCOUNTER — Other Ambulatory Visit: Payer: Self-pay

## 2022-11-03 MED ORDER — LEVOTHYROXINE SODIUM 25 MCG PO TABS: ORAL_TABLET | ORAL | 1 refills | Status: DC

## 2022-11-03 NOTE — Telephone Encounter (Signed)
RX sent to provider

## 2022-11-03 NOTE — Telephone Encounter (Signed)
levothyroxine (SYNTHROID) 25 MCG tablet LOV 09/16/2022 NOV 01/19/2023

## 2022-11-19 DIAGNOSIS — K219 Gastro-esophageal reflux disease without esophagitis: Secondary | ICD-10-CM | POA: Diagnosis not present

## 2022-11-19 DIAGNOSIS — K639 Disease of intestine, unspecified: Secondary | ICD-10-CM | POA: Diagnosis not present

## 2022-12-23 ENCOUNTER — Encounter (INDEPENDENT_AMBULATORY_CARE_PROVIDER_SITE_OTHER): Payer: Self-pay

## 2022-12-31 ENCOUNTER — Ambulatory Visit (INDEPENDENT_AMBULATORY_CARE_PROVIDER_SITE_OTHER): Payer: Medicare HMO

## 2022-12-31 VITALS — BP 110/78 | Ht 62.0 in | Wt 148.0 lb

## 2022-12-31 DIAGNOSIS — Z1159 Encounter for screening for other viral diseases: Secondary | ICD-10-CM

## 2022-12-31 DIAGNOSIS — Z0000 Encounter for general adult medical examination without abnormal findings: Secondary | ICD-10-CM

## 2022-12-31 NOTE — Progress Notes (Signed)
 Because this visit was a virtual/telehealth visit,  certain criteria was not obtained, such a blood pressure, CBG if patient is a diabetic, and timed up and go. Any medications not marked as "taking" was not mentioned during the medication reconciliation part of the visit. Any vitals not documented were not able to be obtained due to this being a telehealth visit. Vitals documented are verbally provided by the patient and per patient there hasn't been any changes in vitals since last visit.   Subjective:   Maria Gomez is a 77 y.o. female who presents for Medicare Annual (Subsequent) preventive examination.  Visit Complete: Virtual  I connected with  Maria Gomez on 12/31/22 by a audio enabled telemedicine application and verified that I am speaking with the correct person using two identifiers.  Patient Location: Home  Provider Location: Home Office  I discussed the limitations of evaluation and management by telemedicine. The patient expressed understanding and agreed to proceed.  Patient Medicare AWV questionnaire was completed by the patient on n/a; I have confirmed that all information answered by patient is correct and no changes since this date.  Review of Systems     Cardiac Risk Factors include: advanced age (>66men, >39 women);dyslipidemia;hypertension     Objective:    Today's Vitals   12/31/22 1017  BP: 110/78  Weight: 148 lb (67.1 kg)  Height: 5\' 2"  (1.575 m)   Body mass index is 27.07 kg/m.     12/31/2022   10:17 AM 08/04/2022   12:06 PM 05/28/2022   10:48 AM 03/18/2022   12:32 PM 02/18/2022   12:00 PM 02/16/2022    1:27 PM 02/10/2022    3:26 PM  Advanced Directives  Does Patient Have a Medical Advance Directive? Yes Yes Yes Yes Yes Yes Yes  Type of Estate agent of [place];Living will Healthcare Power of [place];Living will Living will Healthcare Power of [place];Living will Healthcare Power of [place];Living will  Healthcare Power of [place];Living will Healthcare Power of [place];Living will  Does patient want to make changes to medical advance directive? No - Patient declined No - Patient declined   No - Patient declined    Copy of Healthcare Power of Attorney in Chart? Yes - validated most recent copy scanned in chart (See row information)  No - copy requested  No - copy requested No - copy requested     Current Medications (verified) Outpatient Encounter Medications as of 12/31/2022  Medication Sig   atorvastatin (LIPITOR) 20 MG tablet TAKE 1 TABLET BY MOUTH EVERY DAY   Biotin 5000 MCG TABS Take 5,000 mcg by mouth daily.    Calcium Carb-Cholecalciferol (CALTRATE 600+D3) 600-20 MG-MCG TABS 2 tablet with a meal   Coenzyme Q10 (CO Q 10 PO) Take 800 mg by mouth daily.    diphenhydrAMINE HCl, Sleep, (UNISOM SLEEPGELS) 50 MG CAPS 1 capsule at bedtime as needed   Fiber Adult Gummies 2 g CHEW 2 tablets   levothyroxine (SYNTHROID) 25 MCG tablet TAKE 1 TABLET BY MOUTH EVERY DAY BEFORE BREAKFAST   Melatonin 10-10 MG TBCR 2 tablet at bedtime as needed   Multiple Vitamin (MULTIVITAMIN) tablet Take 1 tablet by mouth daily. Centrum Silver   Polyethylene Glycol 400 0.25 % SOLN Apply to eye.   polyethylene glycol powder (GLYCOLAX/MIRALAX) 17 GM/SCOOP powder Take 1 Container by mouth once.   tamoxifen (NOLVADEX) 20 MG tablet Take 1 tablet (20 mg total) by mouth daily.   vitamin B-12 (CYANOCOBALAMIN) 1000 MCG tablet Take 1,000 mcg  by mouth daily.   zinc gluconate 50 MG tablet Take 50 mg by mouth daily.   No facility-administered encounter medications on file as of 12/31/2022.    Allergies (verified) Niaspan [niacin er], Phenazopyridine hcl, and Statins   History: Past Medical History:  Diagnosis Date   Cancer (HCC) 12/2021   left breast DCIS   History of radiation therapy    Left breast- 03/30/22-04/27/22-Dr. Antony Blackbird   Hypothyroidism    Mitral regurgitation    Osteopenia    S/P minimally invasive  mitral valve repair 03/10/2018   Complex valvuloplasty including triangular resection of posterior leaflet, artificial Gore-tex neochord x6 and Size 30 Sorin Memo 4D Ring SN [id number] via right mini thoracotomy approach   Thyroid nodule 03/07/2018   Incidental - noted on CT scan, atypical cells found on fine-needle aspiration.  General surgery consultation and excision recommended February 2020   Past Surgical History:  Procedure Laterality Date   BREAST EXCISIONAL BIOPSY Bilateral    BREAST LUMPECTOMY WITH RADIOACTIVE SEED LOCALIZATION Left 02/18/2022   Procedure: LEFT BREAST LUMPECTOMY WITH RADIOACTIVE SEED LOCALIZATION;  Surgeon: Griselda Miner, MD;  Location: [facility];  Service: General;  Laterality: Left;   MITRAL VALVE REPAIR Right 03/10/2018   Procedure: MINIMALLY INVASIVE MITRAL VALVE REPAIR (MVR):  -Triangular Resection of Posterior Leaflet P2; -Shortening Plasty of Posterior Leaflet P2; -Placement of Goretex Neo Chords x 6 using Chord-X system; -Ring Annuloplasty with a 30 mm Sorin Memo 4D Ring;  Surgeon: Purcell Nails, MD;  Location: [facility] OR;  Service: Open Heart Surgery;  Laterality: Right;   RIGHT/LEFT HEART CATH AND CORONARY ANGIOGRAPHY N/A 01/18/2018   Procedure: RIGHT/LEFT HEART CATH AND CORONARY ANGIOGRAPHY;  Surgeon: Swaziland, Peter M, MD;  [facility]: [facility] INVASIVE CV LAB;  Service: Cardiovascular;  Laterality: N/A;   TEE WITHOUT CARDIOVERSION N/A 11/09/2017   Procedure: TRANSESOPHAGEAL ECHOCARDIOGRAM (TEE);  Surgeon: Laurey Morale, MD;  Location: [facility] ENDOSCOPY;  Service: Cardiovascular;  Laterality: N/A;   TEE WITHOUT CARDIOVERSION N/A 03/10/2018   Procedure: TRANSESOPHAGEAL ECHOCARDIOGRAM (TEE);  Surgeon: Purcell Nails, MD;  Location: [facility] OR;  Service: Open Heart Surgery;  Laterality: N/A;   Family History  Problem Relation Age of Onset   Dementia Mother    Heart disease Father    Cancer Brother 75       ColoRectal Cancer   Cancer Brother 72       prostates to  bone metz   Cancer Maternal Grandmother 86   Stroke Maternal Grandfather 72   Stroke Paternal Grandfather    Social History   Socioeconomic History   Marital status: Widowed    Spouse name: Not on file   Number of children: 1   Years of education: Not on file   Highest education level: Not on file  Occupational History   Not on file  Tobacco Use   Smoking status: Never   Smokeless tobacco: Never  Vaping Use   Vaping status: Never Used  Substance and Sexual Activity   Alcohol use: No   Drug use: No   Sexual activity: Not Currently    Birth control/protection: Post-menopausal  Other Topics Concern   Not on file  Social History Narrative   Stays alone. Mother in memory care unit will be 52      Bryan (50) bio son      Clydie Braun (09)   Greg (56)      Hobbies: was taking care of her mother. Taking care of the house. Walks  2 miles a day She does have a 3 are yard that she mows weeds   Social Determinants of Health   Financial Resource Strain: Low Risk  (12/31/2022)   Overall Financial Resource Strain (CARDIA)    Difficulty of Paying Living Expenses: Not hard at all  Food Insecurity: No Food Insecurity (12/31/2022)   Hunger Vital Sign    Worried About Running Out of Food in the Last Year: Never true    Ran Out of Food in the Last Year: Never true  Transportation Needs: No Transportation Needs (12/31/2022)   PRAPARE - Administrator, Civil Service (Medical): No    Lack of Transportation (Non-Medical): No  Physical Activity: Sufficiently Active (12/31/2022)   Exercise Vital Sign    Days of Exercise per Week: 7 days    Minutes of Exercise per Session: 30 min  Stress: No Stress Concern Present (12/31/2022)   [employer] of Occupational Health - Occupational Stress Questionnaire    Feeling of Stress : Not at all  Social Connections: Moderately Isolated (12/31/2022)   Social Connection and Isolation Panel [NHANES]    Frequency of Communication with Friends and Family:  More than three times a week    Frequency of Social Gatherings with Friends and Family: More than three times a week    Attends Religious Services: More than 4 times per year    Active Member of [employer] or Organizations: No    Attends Banker Meetings: Never    Marital Status: Widowed    Tobacco Counseling Counseling given: Yes   Clinical Intake:  Pre-visit preparation completed: Yes  Pain : No/denies pain     BMI - recorded: 27.07 Nutritional Status: BMI 25 -29 Overweight Nutritional Risks: None Diabetes: No  How often do you need to have someone help you when you read instructions, pamphlets, or other written materials from your doctor or pharmacy?: 1 - Never  Interpreter Needed?: No  Information entered by ::  , CMA   Activities of Daily Living    12/31/2022   10:26 AM 02/18/2022   12:03 PM  In your present state of health, do you have any difficulty performing the following activities:  Hearing? 0 0  Vision? 0 0  Difficulty concentrating or making decisions? 0 0  Walking or climbing stairs? 0 0  Dressing or bathing? 0 0  Doing errands, shopping? 0   Preparing Food and eating ? N   Using the Toilet? N   In the past six months, have you accidently leaked urine? N   Do you have problems with loss of bowel control? N   Managing your Medications? N   Managing your Finances? N   Housekeeping or managing your Housekeeping? N     Patient Care Team: Eden Emms, NP as PCP - General (Nurse Practitioner) [place], Peter M, MD as PCP - Cardiology (Cardiology) Rachel Moulds, MD as Consulting Physician (Hematology and Oncology) Antony Blackbird, MD as Consulting Physician (Radiation Oncology) Griselda Miner, MD as Consulting Physician (General Surgery)  Indicate any recent Medical Services you may have received from other than [facility] providers in the past year (date may be approximate).     Assessment:   This is a routine wellness examination for  Nikela.  Hearing/Vision screen Hearing Screening - Comments:: Patient denies any hearing difficulties.    Dietary issues and exercise activities discussed:     Goals Addressed  This Visit's Progress    Patient Stated       "To remain active and independent"       Depression Screen    12/31/2022   10:22 AM 09/16/2022    3:45 PM 03/12/2022   10:02 AM 11/06/2021   10:23 AM 10/24/2020   10:51 AM 12/06/2018    9:22 AM 04/11/2018   10:32 AM  PHQ 2/9 Scores  PHQ - 2 Score 0 0 0 0 0 3 0  PHQ- 9 Score  1    6 2     Fall Risk    12/31/2022   10:26 AM 03/12/2022   10:02 AM 11/06/2021   10:31 AM 10/24/2020   10:34 AM 12/06/2018    9:22 AM  Fall Risk   Falls in the past year? 0 0 0 0 0  Number falls in past yr: 0 0 0 0   Injury with Fall? 0 0 0 0   Risk for fall due to : No Fall Risks No Fall Risks No Fall Risks    Follow up Falls prevention discussed Falls prevention discussed Falls prevention discussed Falls prevention discussed;Falls evaluation completed Falls evaluation completed    MEDICARE RISK AT HOME:  Medicare Risk at Home - 12/31/22 1025     Any stairs in or around the home? Yes    If so, are there any without handrails? No    Home free of loose throw rugs in walkways, pet beds, electrical cords, etc? Yes    Adequate lighting in your home to reduce risk of falls? Yes    Life alert? No    Use of a cane, walker or w/c? No    Grab bars in the bathroom? Yes    Shower chair or bench in shower? Yes    Elevated toilet seat or a handicapped toilet? No             TIMED UP AND GO:  Was the test performed?  No    Cognitive Function:        12/31/2022   10:20 AM 11/06/2021   10:38 AM  6CIT Screen  What Year? 0 points 0 points  What month? 0 points 0 points  What time? 0 points 0 points  Count back from 20 0 points 0 points  Months in reverse 0 points 0 points  Repeat phrase 0 points 0 points  Total Score 0 points 0 points     Immunizations Immunization History  Administered Date(s) Administered   COVID-19, mRNA, vaccine(Comirnaty)12 years and older 03/20/2022   Fluad Quad(high Dose 65+) 01/20/2019   Influenza, High Dose Seasonal PF 03/01/2017, 02/15/2018, 01/05/2019, 03/11/2021, 03/03/2022   Influenza-Unspecified 04/08/2012, 03/25/2013, 03/29/2014   PFIZER(Purple Top)SARS-COV-2 Vaccination 06/19/2019, 07/10/2019, 03/01/2020   Pneumococcal Conjugate-13 06/26/2013   Pneumococcal Polysaccharide-23 12/06/2018   Td 10/24/1996   Tdap 04/08/2012   Zoster Recombinant(Shingrix) 01/15/2021, 03/29/2021   Zoster, Live 05/20/2012    TDAP status: Due, Education has been provided regarding the importance of this vaccine. Advised may receive this vaccine at local pharmacy or Health Dept. Aware to provide a copy of the vaccination record if obtained from local pharmacy or Health Dept. Verbalized acceptance and understanding.  Flu Vaccine status: Due, Education has been provided regarding the importance of this vaccine. Advised may receive this vaccine at local pharmacy or Health Dept. Aware to provide a copy of the vaccination record if obtained from local pharmacy or Health Dept. Verbalized acceptance and understanding.  Pneumococcal vaccine status: Up to  date  Covid-19 vaccine status: Information provided on how to obtain vaccines.   Qualifies for Shingles Vaccine? Yes   Zostavax completed No   Shingrix Completed?: Yes  Screening Tests Health Maintenance  Topic Date Due   Hepatitis C Screening  Never done   DTaP/Tdap/Td (3 - Td or Tdap) 04/08/2022   COVID-19 Vaccine (5 - 2023-24 season) 05/15/2022   Medicare Annual Wellness (AWV)  11/07/2022   INFLUENZA VACCINE  12/24/2022   Pneumonia Vaccine 70+ Years old  Completed   DEXA SCAN  Completed   Zoster Vaccines- Shingrix  Completed   HPV VACCINES  Aged Out   Colonoscopy  Discontinued    Health Maintenance  Health Maintenance Due  Topic Date Due    Hepatitis C Screening  Never done   DTaP/Tdap/Td (3 - Td or Tdap) 04/08/2022   COVID-19 Vaccine (5 - 2023-24 season) 05/15/2022   Medicare Annual Wellness (AWV)  11/07/2022   INFLUENZA VACCINE  12/24/2022    Colorectal cancer screening: Type of screening: Colonoscopy. Completed 05/30/2019. Repeat every discontinued by provider due to age years  Mammogram status: Completed 12/30/2021. Repeat every year Scheduled to have mammogram on 01/05/2023 Bone Density status: Completed 04/30/2022. Results reflect: Bone density results: OSTEOPENIA. Repeat every 2 years.  Lung Cancer Screening: (Low Dose CT Chest recommended if Age 60-80 years, 20 pack-year currently smoking OR have quit w/in 15years.) does not qualify.   Additional Screening:  Hepatitis C Screening: does qualify;Ordred 12/31/22  Vision Screening: Recommended annual ophthalmology exams for early detection of glaucoma and other disorders of the eye. Is the patient up to date with their annual eye exam?  Yes  Who is the provider or what is the name of the office in which the patient attends annual eye exams? Dr. Cathey Endow If pt is not established with a provider, would they like to be referred to a provider to establish care? No .   Dental Screening: Recommended annual dental exams for proper oral hygiene  Diabetic Foot Exam: n/a  Community Resource Referral / Chronic Care Management: CRR required this visit?  No   CCM required this visit?  No     Plan:     I have personally reviewed and noted the following in the patient's chart:   Medical and social history Use of alcohol, tobacco or illicit drugs  Current medications and supplements including opioid prescriptions. Patient is not currently taking opioid prescriptions. Functional ability and status Nutritional status Physical activity Advanced directives List of other physicians Hospitalizations, surgeries, and ER visits in previous 12 months Vitals Screenings to include  cognitive, depression, and falls Referrals and appointments  In addition, I have reviewed and discussed with patient certain preventive protocols, quality metrics, and best practice recommendations. A written personalized care plan for preventive services as well as general preventive health recommendations were provided to patient.     Jordan Hawks , CMA   12/31/2022   After Visit Summary: (Mail) Due to this being a telephonic visit, the after visit summary with patients personalized plan was offered to patient via mail   Nurse Notes: Hep C ordered today for patient.

## 2022-12-31 NOTE — Patient Instructions (Addendum)
Maria Gomez , Thank you for taking time to come for your Medicare Wellness Visit. I appreciate your ongoing commitment to your health goals. Please review the following plan we discussed and let me know if I can assist you in the future.   These are the goals we discussed:  Goals      Patient Stated     Maintain current exercise regimen     Patient Stated     "To remain active and independent"        This is a list of the screening recommended for you and due dates:  Health Maintenance  Topic Date Due   Hepatitis C Screening  Never done   DTaP/Tdap/Td vaccine (3 - Td or Tdap) 04/08/2022   COVID-19 Vaccine (5 - 2023-24 season) 05/15/2022   Flu Shot  12/24/2022   Medicare Annual Wellness Visit  12/31/2023   Pneumonia Vaccine  Completed   DEXA scan (bone density measurement)  Completed   Zoster (Shingles) Vaccine  Completed   HPV Vaccine  Aged Out   Colon Cancer Screening  Discontinued    Advanced directives: Advance directive discussed with you today. Even though you declined this today, please call our office should you change your mind, and we can give you the proper paperwork for you to fill out. Advance care planning is a way to make decisions about medical care that fits your values in case you are ever unable to make these decisions for yourself.  Information on Advanced Care Planning can be found at [facility] Advance Health Care Directives Advance Health Care Directives ([email or web address]    Conditions/risks identified:  You are due for the vaccines checked below. You may have these done at your preferred pharmacy. Please have them fax the office proof of the vaccines so that we can update your chart.   [x]  Flu (due annually) []  Shingrix (Shingles vaccine) []  Pneumonia Vaccines [x]  TDAP (Tetanus) Vaccine every 10 years [x]  Covid-19   Next appointment: VIRTUAL/TELEPHONE APPOINTMENT Follow up in one year for your annual wellness visit  January 06, 2024 at 10:15 am telephone visit.    Preventive Care 65 Years and Older, Female Preventive care refers to lifestyle choices and visits with your health care provider that can promote health and wellness. What does preventive care include? A yearly physical exam. This is also called an annual well check. Dental exams once or twice a year. Routine eye exams. Ask your health care provider how often you should have your eyes checked. Personal lifestyle choices, including: Daily care of your teeth and gums. Regular physical activity. Eating a healthy diet. Avoiding tobacco and drug use. Limiting alcohol use. Practicing safe sex. Taking low-dose aspirin every day. Taking vitamin and mineral supplements as recommended by your health care provider. What happens during an annual well check? The services and screenings done by your health care provider during your annual well check will depend on your age, overall health, lifestyle risk factors, and family history of disease. Counseling  Your health care provider may ask you questions about your: Alcohol use. Tobacco use. Drug use. Emotional well-being. Home and relationship well-being. Sexual activity. Eating habits. History of falls. Memory and ability to understand (cognition). Work and work Astronomer. Reproductive health. Screening  You may have the following tests or measurements: Height, weight, and BMI. Blood pressure. Lipid and cholesterol levels. These may be checked every 5 years, or more frequently if you are over 36 years old. Skin check.  Lung cancer screening. You may have this screening every year starting at age 19 if you have a 30-pack-year history of smoking and currently smoke or have quit within the past 15 years. Fecal occult blood test (FOBT) of the stool. You may have this test every year starting at age 64. Flexible sigmoidoscopy or colonoscopy. You may have a sigmoidoscopy every 5 years or a colonoscopy every  10 years starting at age 44. Hepatitis C blood test. Hepatitis B blood test. Sexually transmitted disease (STD) testing. Diabetes screening. This is done by checking your blood sugar (glucose) after you have not eaten for a while (fasting). You may have this done every 1-3 years. Bone density scan. This is done to screen for osteoporosis. You may have this done starting at age 74. Mammogram. This may be done every 1-2 years. Talk to your health care provider about how often you should have regular mammograms. Talk with your health care provider about your test results, treatment options, and if necessary, the need for more tests. Vaccines  Your health care provider may recommend certain vaccines, such as: Influenza vaccine. This is recommended every year. Tetanus, diphtheria, and acellular pertussis (Tdap, Td) vaccine. You may need a Td booster every 10 years. Zoster vaccine. You may need this after age 38. Pneumococcal 13-valent conjugate (PCV13) vaccine. One dose is recommended after age 35. Pneumococcal polysaccharide (PPSV23) vaccine. One dose is recommended after age 46. Talk to your health care provider about which screenings and vaccines you need and how often you need them. This information is not intended to replace advice given to you by your health care provider. Make sure you discuss any questions you have with your health care provider. Document Released: 06/07/2015 Document Revised: 01/29/2016 Document Reviewed: 03/12/2015 Elsevier Interactive Patient Education  2017 [employer].  Fall Prevention in the Home Falls can cause injuries. They can happen to people of all ages. There are many things you can do to make your home safe and to help prevent falls. What can I do on the outside of my home? Regularly fix the edges of walkways and driveways and fix any cracks. Remove anything that might make you trip as you walk through a door, such as a raised step or threshold. Trim any  bushes or trees on the path to your home. Use bright outdoor lighting. Clear any walking paths of anything that might make someone trip, such as rocks or tools. Regularly check to see if handrails are loose or broken. Make sure that both sides of any steps have handrails. Any raised decks and porches should have guardrails on the edges. Have any leaves, snow, or ice cleared regularly. Use sand or salt on walking paths during winter. Clean up any spills in your garage right away. This includes oil or grease spills. What can I do in the bathroom? Use night lights. Install grab bars by the toilet and in the tub and shower. Do not use towel bars as grab bars. Use non-skid mats or decals in the tub or shower. If you need to sit down in the shower, use a plastic, non-slip stool. Keep the floor dry. Clean up any water that spills on the floor as soon as it happens. Remove soap buildup in the tub or shower regularly. Attach bath mats securely with double-sided non-slip rug tape. Do not have throw rugs and other things on the floor that can make you trip. What can I do in the bedroom? Use night lights. Make sure  that you have a light by your bed that is easy to reach. Do not use any sheets or blankets that are too big for your bed. They should not hang down onto the floor. Have a firm chair that has side arms. You can use this for support while you get dressed. Do not have throw rugs and other things on the floor that can make you trip. What can I do in the kitchen? Clean up any spills right away. Avoid walking on wet floors. Keep items that you use a lot in easy-to-reach places. If you need to reach something above you, use a strong step stool that has a grab bar. Keep electrical cords out of the way. Do not use floor polish or wax that makes floors slippery. If you must use wax, use non-skid floor wax. Do not have throw rugs and other things on the floor that can make you trip. What can I do  with my stairs? Do not leave any items on the stairs. Make sure that there are handrails on both sides of the stairs and use them. Fix handrails that are broken or loose. Make sure that handrails are as long as the stairways. Check any carpeting to make sure that it is firmly attached to the stairs. Fix any carpet that is loose or worn. Avoid having throw rugs at the top or bottom of the stairs. If you do have throw rugs, attach them to the floor with carpet tape. Make sure that you have a light switch at the top of the stairs and the bottom of the stairs. If you do not have them, ask someone to add them for you. What else can I do to help prevent falls? Wear shoes that: Do not have high heels. Have rubber bottoms. Are comfortable and fit you well. Are closed at the toe. Do not wear sandals. If you use a stepladder: Make sure that it is fully opened. Do not climb a closed stepladder. Make sure that both sides of the stepladder are locked into place. Ask someone to hold it for you, if possible. Clearly mark and make sure that you can see: Any grab bars or handrails. First and last steps. Where the edge of each step is. Use tools that help you move around (mobility aids) if they are needed. These include: Canes. Walkers. Scooters. Crutches. Turn on the lights when you go into a dark area. Replace any light bulbs as soon as they burn out. Set up your furniture so you have a clear path. Avoid moving your furniture around. If any of your floors are uneven, fix them. If there are any pets around you, be aware of where they are. Review your medicines with your doctor. Some medicines can make you feel dizzy. This can increase your chance of falling. Ask your doctor what other things that you can do to help prevent falls. This information is not intended to replace advice given to you by your health care provider. Make sure you discuss any questions you have with your health care  provider. Document Released: 03/07/2009 Document Revised: 10/17/2015 Document Reviewed: 06/15/2014 Elsevier Interactive Patient Education  2017 [employer].

## 2023-01-05 ENCOUNTER — Other Ambulatory Visit (INDEPENDENT_AMBULATORY_CARE_PROVIDER_SITE_OTHER): Payer: Medicare HMO

## 2023-01-05 ENCOUNTER — Other Ambulatory Visit: Payer: Self-pay | Admitting: Hematology and Oncology

## 2023-01-05 ENCOUNTER — Ambulatory Visit
Admission: RE | Admit: 2023-01-05 | Discharge: 2023-01-05 | Disposition: A | Discharge status: Home / Self Care | Payer: Medicare HMO | Source: Ambulatory Visit | Attending: Hematology and Oncology | Admitting: Hematology and Oncology

## 2023-01-05 DIAGNOSIS — Z1159 Encounter for screening for other viral diseases: Secondary | ICD-10-CM | POA: Diagnosis not present

## 2023-01-05 DIAGNOSIS — Z853 Personal history of malignant neoplasm of breast: Secondary | ICD-10-CM | POA: Diagnosis not present

## 2023-01-05 DIAGNOSIS — D0512 Intraductal carcinoma in situ of left breast: Secondary | ICD-10-CM

## 2023-01-05 DIAGNOSIS — R921 Mammographic calcification found on diagnostic imaging of breast: Secondary | ICD-10-CM | POA: Diagnosis not present

## 2023-01-05 HISTORY — DX: Personal history of irradiation: Z92.3

## 2023-01-19 ENCOUNTER — Ambulatory Visit (INDEPENDENT_AMBULATORY_CARE_PROVIDER_SITE_OTHER): Payer: Medicare HMO | Admitting: Nurse Practitioner

## 2023-01-19 ENCOUNTER — Encounter: Payer: Self-pay | Admitting: Nurse Practitioner

## 2023-01-19 VITALS — BP 110/80 | HR 72 | Temp 97.9°F | Ht 62.0 in | Wt 150.2 lb

## 2023-01-19 DIAGNOSIS — K5900 Constipation, unspecified: Secondary | ICD-10-CM | POA: Diagnosis not present

## 2023-01-19 DIAGNOSIS — E785 Hyperlipidemia, unspecified: Secondary | ICD-10-CM

## 2023-01-19 DIAGNOSIS — M810 Age-related osteoporosis without current pathological fracture: Secondary | ICD-10-CM | POA: Diagnosis not present

## 2023-01-19 DIAGNOSIS — R202 Paresthesia of skin: Secondary | ICD-10-CM | POA: Diagnosis not present

## 2023-01-19 DIAGNOSIS — Z0000 Encounter for general adult medical examination without abnormal findings: Secondary | ICD-10-CM

## 2023-01-19 DIAGNOSIS — E039 Hypothyroidism, unspecified: Secondary | ICD-10-CM | POA: Diagnosis not present

## 2023-01-19 LAB — COMPREHENSIVE METABOLIC PANEL
ALT: 14 U/L (ref 0–35)
AST: 17 U/L (ref 0–37)
Albumin: 4 g/dL (ref 3.5–5.2)
Alkaline Phosphatase: 52 U/L (ref 39–117)
BUN: 15 mg/dL (ref 6–23)
CO2: 31 mEq/L (ref 19–32)
Calcium: 10.1 mg/dL (ref 8.4–10.5)
Chloride: 101 mEq/L (ref 96–112)
Creatinine, Ser: 1.07 mg/dL (ref 0.40–1.20)
GFR: 50.22 mL/min — ABNORMAL LOW (ref >60.00)
Glucose, Bld: 78 mg/dL (ref 70–99)
Potassium: 4.4 mEq/L (ref 3.5–5.1)
Sodium: 139 mEq/L (ref 135–145)
Total Bilirubin: 0.3 mg/dL (ref 0.2–1.2)
Total Protein: 6.6 g/dL (ref 6.0–8.3)

## 2023-01-19 LAB — LIPID PANEL
Cholesterol: 138 mg/dL (ref 0–200)
HDL: 49.7 mg/dL (ref >39.00)
NonHDL: 88.44
Total CHOL/HDL Ratio: 3
Triglycerides: 279 mg/dL — ABNORMAL HIGH (ref 0.0–149.0)
VLDL: 55.8 mg/dL — ABNORMAL HIGH (ref 0.0–40.0)

## 2023-01-19 LAB — CBC
HCT: 42.7 % (ref 36.0–46.0)
Hemoglobin: 14 g/dL (ref 12.0–15.0)
MCHC: 32.7 g/dL (ref 30.0–36.0)
MCV: 96.2 fl (ref 78.0–100.0)
Platelets: 220 10*3/uL (ref 150.0–400.0)
RBC: 4.44 Mil/uL (ref 3.87–5.11)
RDW: 13.3 % (ref 11.5–15.5)
WBC: 7.3 10*3/uL (ref 4.0–10.5)

## 2023-01-19 LAB — VITAMIN B12: Vitamin B-12: 1112 pg/mL — ABNORMAL HIGH (ref 211–911)

## 2023-01-19 LAB — TSH: TSH: 3.14 u[IU]/mL (ref 0.35–5.50)

## 2023-01-19 LAB — LDL CHOLESTEROL, DIRECT: Direct LDL: 66 mg/dL

## 2023-01-19 LAB — VITAMIN D 25 HYDROXY (VIT D DEFICIENCY, FRACTURES): VITD: 93.33 ng/mL (ref 30.00–100.00)

## 2023-01-19 MED ORDER — ATORVASTATIN CALCIUM 20 MG PO TABS: 20.0000 mg | ORAL_TABLET | Freq: Every day | ORAL | 1 refills | Status: DC

## 2023-01-19 NOTE — Assessment & Plan Note (Signed)
Discussed age-appropriate immunizations and screening exams.  Did review patient's personal, surgical, social, family histories.  Patient is up-to-date with all age-appropriate vaccinations.  Patient is up-to-date on CRC screening, breast cancer screening.  Patient is aged out for cervical cancer screening.  Patient was given information at discharge about preventative healthcare maintenance with anticipatory guidance

## 2023-01-19 NOTE — Progress Notes (Signed)
Established Patient Office Visit  Subjective   Patient ID: Maria Gomez, female    DOB: 1946-05-19  Age: 77 y.o. MRN: [id number]  Chief Complaint  Patient presents with   Annual Exam    HPI  for complete physical and follow up of chronic conditions.  Osteoporisis: was on medication has been off for a year.  Patient is on Caltrate over-the-counter  Hypothyroid: on levothryroxine  Breast Ca: Dr Al Pimple. Sees her every 6 months.  Patient currently maintained on tamoxifen  Mitral regurg: peter [place]. Once a year  Immunizations: -Tetanus: Completed in 2024 -Influenza: Completed this season -Shingles: Completed Shingrix series -Pneumonia: Completed   Diet: Fair diet. States that she is eating 2 meals a day sometimes with 3 sometimes some snakcs. She will have coffee in the morning water with crystal light  Exercise: walks 2 miles a day most days. States that she does the lawn working 9 mowing, limb pick up and weekds)  Eye exam: Completes annually. Glasses.   Dental exam: Completes semi-annually    Colonoscopy: Completed in 2015. Dr Dulce Sellar with [facility] states that she will see him in the next couples Lung Cancer Screening: N/A  Pap smear: Aged out  Dexa: Done December 2023  Mammogram: 01/05/2023 with Dr Al Pimple, recommended repeat 45-month diagnostic mammogram  Sleep: states that she will go to bed around 930-10 and will get up 530. States that she will use her melatonin and unisom States that she has had trouble sleeping since her husband passed 26 years ago. States trouble getting to sleep          Review of Systems  Constitutional:  Negative for chills and fever.  Respiratory:  Negative for shortness of breath.   Cardiovascular:  Negative for chest pain and leg swelling.  Gastrointestinal:  Negative for abdominal pain, blood in stool, constipation, diarrhea, nausea and vomiting.       BM daily to every other day with benefiber and miralax    Genitourinary:   Negative for dysuria and hematuria.  Neurological:  Negative for dizziness, tingling (bilateral feet constant.) and headaches.  Psychiatric/Behavioral:  Negative for hallucinations and suicidal ideas.       Objective:     BP 110/80   Pulse 72   Temp 97.9 F (36.6 C) (Temporal)   Ht 5\' 2"  (1.575 m)   Wt 150 lb 3.2 oz (68.1 kg)   SpO2 96%   BMI 27.47 kg/m    Physical Exam Vitals and nursing note reviewed.  Constitutional:      Appearance: Normal appearance.  HENT:     Right Ear: Tympanic membrane, ear canal and external ear normal.     Left Ear: Tympanic membrane, ear canal and external ear normal.     Mouth/Throat:     Mouth: Mucous membranes are moist.     Pharynx: Oropharynx is clear.  Eyes:     Extraocular Movements: Extraocular movements intact.     Pupils: Pupils are equal, round, and reactive to light.  Cardiovascular:     Rate and Rhythm: Normal rate and regular rhythm.     Pulses: Normal pulses.     Heart sounds: Normal heart sounds.  Pulmonary:     Effort: Pulmonary effort is normal.     Breath sounds: Normal breath sounds.  Abdominal:     General: Bowel sounds are normal. There is no distension.     Palpations: There is no mass.     Tenderness: There is no abdominal tenderness.  Hernia: No hernia is present.  Musculoskeletal:     Right lower leg: No edema.     Left lower leg: No edema.  Lymphadenopathy:     Cervical: No cervical adenopathy.  Skin:    General: Skin is warm.  Neurological:     General: No focal deficit present.     Mental Status: She is alert.     Deep Tendon Reflexes:     Reflex Scores:      Bicep reflexes are 2+ on the right side and 2+ on the left side.      Patellar reflexes are 2+ on the right side and 2+ on the left side.    Comments: Bilateral upper and lower extremity strength 5/5  Psychiatric:        Mood and Affect: Mood normal.        Behavior: Behavior normal.        Thought Content: Thought content normal.         Judgment: Judgment normal.      No results found for any visits on 01/19/23.    The 10-year ASCVD risk score (Arnett DK, et al., 2019) is: 15%    Assessment & Plan:   Problem List Items Addressed This Visit       Endocrine   Hypothyroidism    Patient currently maintained on levothyroxine.  Pending TSH today      Relevant Orders   TSH     Musculoskeletal and Integument   Osteoporosis    History of the same.  Patient has been on prescription medicines in the past for this.  Will check vitamin D level today continue Caltrate over-the-counter      Relevant Medications   Cholecalciferol (D3) 25 MCG (1000 UT) capsule   Other Relevant Orders   VITAMIN D 25 Hydroxy (Vit-D Deficiency, Fractures)     Other   Constipation    History of the same on Benefiber and MiraLAX works well continue      Relevant Orders   TSH   Preventative health care - Primary    Discussed age-appropriate immunizations and screening exams.  Did review patient's personal, surgical, social, family histories.  Patient is up-to-date with all age-appropriate vaccinations.  Patient is up-to-date on CRC screening, breast cancer screening.  Patient is aged out for cervical cancer screening.  Patient was given information at discharge about preventative healthcare maintenance with anticipatory guidance      Relevant Orders   CBC   Comprehensive metabolic panel   Hyperlipidemia    Patient currently maintained on atorvastatin.  Continue pending lipid panel today      Relevant Medications   atorvastatin (LIPITOR) 20 MG tablet   Other Relevant Orders   Lipid panel   Paresthesia    Pending B12 level today      Relevant Orders   Vitamin B12    Return in about 1 year (around 01/19/2024) for CPE and Labs.    [name], NP

## 2023-01-19 NOTE — Assessment & Plan Note (Signed)
Patient currently maintained on levothyroxine.  Pending TSH today

## 2023-01-19 NOTE — Assessment & Plan Note (Signed)
History of the same on Benefiber and MiraLAX works well continue

## 2023-01-19 NOTE — Patient Instructions (Signed)
Nice to see you today I will be in touch with the labs once I have reviewed them Follow up with me in 1 year, sooner if you need me  

## 2023-01-19 NOTE — Assessment & Plan Note (Signed)
History of the same.  Patient has been on prescription medicines in the past for this.  Will check vitamin D level today continue Caltrate over-the-counter

## 2023-01-19 NOTE — Assessment & Plan Note (Signed)
Pending B12 level today

## 2023-01-19 NOTE — Assessment & Plan Note (Signed)
Patient currently maintained on atorvastatin.  Continue pending lipid panel today

## 2023-02-03 ENCOUNTER — Telehealth: Payer: Self-pay | Admitting: Hematology and Oncology

## 2023-02-03 NOTE — Telephone Encounter (Signed)
Pt called and left vmail that she was under the weather and wanted to cancel 9/12 appt with Dr.Iruku. Called pt, left message for pt to call office when she was feeling better to reschedule office visit.

## 2023-02-04 ENCOUNTER — Inpatient Hospital Stay: Payer: Medicare HMO | Admitting: Hematology and Oncology

## 2023-02-10 ENCOUNTER — Encounter: Payer: Self-pay | Admitting: Nurse Practitioner

## 2023-02-10 ENCOUNTER — Encounter: Payer: Self-pay | Admitting: *Deleted

## 2023-02-10 ENCOUNTER — Ambulatory Visit (INDEPENDENT_AMBULATORY_CARE_PROVIDER_SITE_OTHER): Payer: Medicare HMO | Admitting: Nurse Practitioner

## 2023-02-10 ENCOUNTER — Ambulatory Visit (INDEPENDENT_AMBULATORY_CARE_PROVIDER_SITE_OTHER)
Admission: RE | Admit: 2023-02-10 | Discharge: 2023-02-10 | Disposition: A | Discharge status: Home / Self Care | Payer: Medicare HMO | Source: Ambulatory Visit | Attending: Nurse Practitioner

## 2023-02-10 VITALS — BP 110/80 | HR 74 | Temp 97.6°F | Ht 62.0 in | Wt 146.6 lb

## 2023-02-10 DIAGNOSIS — W19XXXA Unspecified fall, initial encounter: Secondary | ICD-10-CM | POA: Diagnosis not present

## 2023-02-10 DIAGNOSIS — S82832A Other fracture of upper and lower end of left fibula, initial encounter for closed fracture: Secondary | ICD-10-CM | POA: Diagnosis not present

## 2023-02-10 DIAGNOSIS — S82402A Unspecified fracture of shaft of left fibula, initial encounter for closed fracture: Secondary | ICD-10-CM

## 2023-02-10 DIAGNOSIS — M25572 Pain in left ankle and joints of left foot: Secondary | ICD-10-CM

## 2023-02-10 DIAGNOSIS — M19072 Primary osteoarthritis, left ankle and foot: Secondary | ICD-10-CM | POA: Diagnosis not present

## 2023-02-10 DIAGNOSIS — M79672 Pain in left foot: Secondary | ICD-10-CM | POA: Diagnosis not present

## 2023-02-10 DIAGNOSIS — S82432A Displaced oblique fracture of shaft of left fibula, initial encounter for closed fracture: Secondary | ICD-10-CM | POA: Diagnosis not present

## 2023-02-10 DIAGNOSIS — M7732 Calcaneal spur, left foot: Secondary | ICD-10-CM | POA: Diagnosis not present

## 2023-02-10 NOTE — Assessment & Plan Note (Signed)
Fall with unknown etiology.  Likely secondary to dehydration given fever and decreased oral intake at the time.  Neurological exam completely benign in office.  Pending x-rays

## 2023-02-10 NOTE — Progress Notes (Signed)
Acute Office Visit  Subjective:     Patient ID: Maria Gomez, female    DOB: Feb 22, 1946, 77 y.o.   MRN: [id number]  Chief Complaint  Patient presents with   Ankle Pain    Pt complains of falling on the way to her bed twice. Symptoms started a week ago.  Left ankle appears swollen.      Patient is in today for fall with history of  thyroid disease, osteoporsis, HLD,   States that she fell twice on her way to the bed. States that she was sick last week. States that her son was out of town and DIL was working so she was by herself during that time. States last Tuesday night she had a fever of over 102. Sttes that she got up and walked down the hall and ended up in the floor. States she does not know why she fell. Staets that she got up and then fell again. States that she was able to get up and go to bed. States that it was swollen and she was using ice on it. States that ever since she has been using the walker in the house and has it with her today. States that she is still swollen. States that she does have pain with weight bearing. States that non weight bearing it does not. States that she has been taking ibuprofen for the pain    Review of Systems  Constitutional:  Negative for chills and fever.  Respiratory:  Negative for shortness of breath.   Cardiovascular:  Negative for chest pain.  Musculoskeletal:  Positive for joint pain.  Neurological:  Negative for dizziness, tingling, weakness and headaches.        Objective:    BP 110/80   Pulse 74   Temp 97.6 F (36.4 C) (Temporal)   Ht 5\' 2"  (1.575 m)   Wt 146 lb 9.6 oz (66.5 kg)   SpO2 96%   BMI 26.81 kg/m    Physical Exam Vitals and nursing note reviewed.  Constitutional:      Appearance: Normal appearance.  HENT:     Mouth/Throat:     Mouth: Mucous membranes are moist.     Pharynx: Oropharynx is clear.  Eyes:     Extraocular Movements: Extraocular movements intact.     Pupils: Pupils are equal,  round, and reactive to light.  Cardiovascular:     Rate and Rhythm: Normal rate and regular rhythm.     Heart sounds: Normal heart sounds.  Pulmonary:     Effort: Pulmonary effort is normal.     Breath sounds: Normal breath sounds.  Musculoskeletal:        General: Tenderness and signs of injury present.     Left lower leg: Edema present.     Right ankle: Normal pulse.     Left ankle: Swelling and ecchymosis present. Tenderness present over the lateral malleolus. No base of 5th metatarsal tenderness. Decreased range of motion. Normal pulse.  Neurological:     General: No focal deficit present.     Mental Status: She is alert.     Comments: Bilateral upper and lower extremity strength 5/5     No results found for any visits on 02/10/23.      Assessment & Plan:   Problem List Items Addressed This Visit       Musculoskeletal and Integument   Closed fracture of shaft of left fibula    Tall cam walker boot applied in office.  Patient states she is okay doing over-the-counter analgesics as needed referral to orthopedics made today      Relevant Orders   Ambulatory referral to Orthopedic Surgery     Other   Pain of joint of left ankle and foot    Pending imaging of ankle and foot.      Relevant Orders   DG Ankle Complete Left (Completed)   DG Foot Complete Left (Completed)   Fall - Primary    Fall with unknown etiology.  Likely secondary to dehydration given fever and decreased oral intake at the time.  Neurological exam completely benign in office.  Pending x-rays      Relevant Orders   DG Ankle Complete Left (Completed)   DG Foot Complete Left (Completed)    No orders of the defined types were placed in this encounter.   Return if symptoms worsen or fail to improve.  [name], NP

## 2023-02-10 NOTE — Patient Instructions (Signed)
Nice to see you today I will be in touch with the xrays once I have the results

## 2023-02-10 NOTE — Assessment & Plan Note (Signed)
Pending imaging of ankle and foot.

## 2023-02-10 NOTE — Assessment & Plan Note (Signed)
Tall cam walker boot applied in office.  Patient states she is okay doing over-the-counter analgesics as needed referral to orthopedics made today

## 2023-02-11 ENCOUNTER — Telehealth: Payer: Self-pay | Admitting: Nurse Practitioner

## 2023-02-11 NOTE — Telephone Encounter (Signed)
Called patient we were not able to process her order for boot due error in signature. She is not going to be able to come by today. She will come by in the morning. She will call office when she gets here and ask for Maria Gomez or Maria Gomez and we will take it out to her for signature.

## 2023-02-12 DIAGNOSIS — S82832A Other fracture of upper and lower end of left fibula, initial encounter for closed fracture: Secondary | ICD-10-CM | POA: Diagnosis not present

## 2023-02-12 NOTE — Telephone Encounter (Signed)
Patient has came by today and signed tablet for order.  No further action needed at this time.

## 2023-02-18 DIAGNOSIS — H2513 Age-related nuclear cataract, bilateral: Secondary | ICD-10-CM | POA: Diagnosis not present

## 2023-02-18 DIAGNOSIS — H524 Presbyopia: Secondary | ICD-10-CM | POA: Diagnosis not present

## 2023-02-22 DIAGNOSIS — S82832D Other fracture of upper and lower end of left fibula, subsequent encounter for closed fracture with routine healing: Secondary | ICD-10-CM | POA: Diagnosis not present

## 2023-02-23 ENCOUNTER — Telehealth: Payer: Self-pay | Admitting: Hematology and Oncology

## 2023-02-23 NOTE — Telephone Encounter (Signed)
Per IB message 02/23/23; I rescheduled the patients appointment. I called and left the patient a voice message with the new date and time.

## 2023-03-02 ENCOUNTER — Inpatient Hospital Stay: Payer: Medicare HMO | Attending: Hematology and Oncology | Admitting: Hematology and Oncology

## 2023-03-02 ENCOUNTER — Encounter: Payer: Self-pay | Admitting: Hematology and Oncology

## 2023-03-02 VITALS — BP 139/73 | HR 77 | Temp 97.5°F | Resp 18 | Wt 147.3 lb

## 2023-03-02 DIAGNOSIS — Z923 Personal history of irradiation: Secondary | ICD-10-CM | POA: Insufficient documentation

## 2023-03-02 DIAGNOSIS — D0512 Intraductal carcinoma in situ of left breast: Secondary | ICD-10-CM

## 2023-03-02 DIAGNOSIS — Z79810 Long term (current) use of selective estrogen receptor modulators (SERMs): Secondary | ICD-10-CM | POA: Diagnosis not present

## 2023-03-02 NOTE — Progress Notes (Signed)
BRIEF ONCOLOGIC HISTORY:  Oncology History  Ductal carcinoma in situ (DCIS) of left breast  12/30/2021 Mammogram   Screening mammogram showed new calcifications in the left breast warranting further evaluation, no findings suspicious for malignancy in the right breast.  She then had a diagnostic mammogram as well as biopsy.   01/28/2022 Pathology Results   Left breast needle core biopsy showed DCIS, grade 2-3 out of 3, ER 100% positive strong staining PR 70% positive strong staining   02/18/2022 Definitive Surgery   Left breast lumpectomy on September 27 showed DCIS, intermediate to high nuclear grade, negative for invasive carcinoma, margins free of carcinoma fibrocystic changes.  Prognostics not repeated   02/18/2022 Cancer Staging   Staging form: Breast, AJCC 8th Edition - Pathologic stage from 02/18/2022: Stage 0 (pTis (DCIS), pN0, cM0) - Signed by Loa Socks, NP on 08/04/2022 Stage prefix: Initial diagnosis   03/30/2022 - 04/27/2022 Radiation Therapy   Plan Name: Breast_L_BH Site: Breast, Left Technique: 3D Mode: Photon Dose Per Fraction: 2.67 Gy Prescribed Dose (Delivered / Prescribed): 40.05 Gy / 40.05 Gy Prescribed Fxs (Delivered / Prescribed): 15 / 15   Plan Name: Brst_L_BH_Bst Site: Breast, Left Technique: 3D Mode: Photon Dose Per Fraction: 2 Gy Prescribed Dose (Delivered / Prescribed): 10 Gy / 10 Gy Prescribed Fxs (Delivered / Prescribed): 5 / 5   04/2022 -  Anti-estrogen oral therapy   Tamoxifen x 5 years     INTERVAL HISTORY:   Discussed the use of AI scribe software for clinical note transcription with the patient, who gave verbal consent to proceed.  History of Present Illness   The patient, with a history of breast cancer and osteopenia, presents after a recent fall resulting in a leg injury. She reports feeling 'helpless' and is concerned about the possibility of future falls, especially given her living situation. She lives alone, but her son and  daughter-in-law live next door and visit once a day. She is considering a life alert system for added safety.  The patient's leg injury is improving, with decreased swelling and discoloration. She is managing the pain with ibuprofen and Tylenol, and finds that icing the leg provides relief. She is due for a follow-up with the orthopedist.  The patient's breast cancer is well-managed with tamoxifen, which she tolerates well. The only side effect is occasional hot flashes, particularly at night, which sometimes wake her from sleep. She is diligent about taking her prescribed vitamin D and calcium supplements. She recently had a mammogram, which showed calcifications in the right breast.  The patient was previously active, walking two miles a day, but has been unable to continue this since the fall. She has been doing some weight training with her arms while seated.       REVIEW OF SYSTEMS:  Review of Systems  Constitutional:  Negative for appetite change, chills, fatigue, fever and unexpected weight change.  HENT:   Negative for hearing loss, lump/mass and trouble swallowing.   Eyes:  Negative for eye problems and icterus.  Respiratory:  Negative for chest tightness, cough and shortness of breath.   Cardiovascular:  Negative for chest pain, leg swelling and palpitations.  Gastrointestinal:  Negative for abdominal distention, abdominal pain, constipation, diarrhea, nausea and vomiting.  Endocrine: Negative for hot flashes.  Genitourinary:  Negative for difficulty urinating.   Musculoskeletal:  Negative for arthralgias.  Skin:  Negative for itching and rash.  Neurological:  Negative for dizziness, extremity weakness, headaches and numbness.  Hematological:  Negative for adenopathy.  Does not bruise/bleed easily.  Psychiatric/Behavioral:  Negative for depression. The patient is not nervous/anxious.   Breast: Denies any new nodularity, masses, tenderness, nipple changes, or nipple discharge.       PAST MEDICAL/SURGICAL HISTORY:  Past Medical History:  Diagnosis Date   Cancer (HCC) 12/2021   left breast DCIS   History of radiation therapy    Left breast- 03/30/22-04/27/22-Dr. Antony Blackbird   Hypothyroidism    Mitral regurgitation    Osteopenia    Personal history of radiation therapy    S/P minimally invasive mitral valve repair 03/10/2018   Complex valvuloplasty including triangular resection of posterior leaflet, artificial Gore-tex neochord x6 and Size 30 Sorin Memo 4D Ring SN [id number] via right mini thoracotomy approach   Thyroid nodule 03/07/2018   Incidental - noted on CT scan, atypical cells found on fine-needle aspiration.  General surgery consultation and excision recommended February 2020   Past Surgical History:  Procedure Laterality Date   BREAST EXCISIONAL BIOPSY Bilateral    BREAST LUMPECTOMY Left 02/18/2022   dcis   BREAST LUMPECTOMY WITH RADIOACTIVE SEED LOCALIZATION Left 02/18/2022   Procedure: LEFT BREAST LUMPECTOMY WITH RADIOACTIVE SEED LOCALIZATION;  Surgeon: Griselda Miner, MD;  Location: [facility];  Service: General;  Laterality: Left;   MITRAL VALVE REPAIR Right 03/10/2018   Procedure: MINIMALLY INVASIVE MITRAL VALVE REPAIR (MVR):  -Triangular Resection of Posterior Leaflet P2; -Shortening Plasty of Posterior Leaflet P2; -Placement of Goretex Neo Chords x 6 using Chord-X system; -Ring Annuloplasty with a 30 mm Sorin Memo 4D Ring;  Surgeon: Purcell Nails, MD;  Location: [facility] OR;  Service: Open Heart Surgery;  Laterality: Right;   RIGHT/LEFT HEART CATH AND CORONARY ANGIOGRAPHY N/A 01/18/2018   Procedure: RIGHT/LEFT HEART CATH AND CORONARY ANGIOGRAPHY;  Surgeon: Swazil[place], Peter M, MD;  Location: [facility] INVASIVE CV LAB;  Service: Cardiovascular;  Laterality: N/A;   TEE WITHOUT CARDIOVERSION N/A 11/09/2017   Procedure: TRANSESOPHAGEAL ECHOCARDIOGRAM (TEE);  Surgeon: Laurey Morale, MD;  Location: [facility] ENDOSCOPY;  Service: Cardiovascular;   Laterality: N/A;   TEE WITHOUT CARDIOVERSION N/A 03/10/2018   Procedure: TRANSESOPHAGEAL ECHOCARDIOGRAM (TEE);  Surgeon: Purcell Nails, MD;  Location: [facility] OR;  Service: Open Heart Surgery;  Laterality: N/A;     ALLERGIES:  Allergies  Allergen Reactions   Niaspan [Niacin Er] Other (See Comments)    Severe flushing   Phenazopyridine Hcl Other (See Comments)    Made sick    Statins     Severe flushing     CURRENT MEDICATIONS:  Outpatient Encounter Medications as of 03/02/2023  Medication Sig   amoxicillin (AMOXIL) 500 MG capsule SMARTSIG:4 Capsule(s) By Mouth   atorvastatin (LIPITOR) 20 MG tablet Take 1 tablet (20 mg total) by mouth daily.   Biotin 5000 MCG TABS Take 5,000 mcg by mouth daily.    Calcium Carb-Cholecalciferol (CALTRATE 600+D3) 600-20 MG-MCG TABS 2 tablet with a meal   Cholecalciferol (D3) 25 MCG (1000 UT) capsule 1 capsule Orally Once a day   Coenzyme Q10 (CO Q 10 PO) Take 800 mg by mouth daily.    diphenhydrAMINE HCl, Sleep, (UNISOM SLEEPGELS) 50 MG CAPS 1 capsule at bedtime as needed   Fiber Adult Gummies 2 g CHEW 2 tablets   levothyroxine (SYNTHROID) 25 MCG tablet TAKE 1 TABLET BY MOUTH EVERY DAY BEFORE BREAKFAST   Melatonin 10-10 MG TBCR 2 tablet at bedtime as needed   Multiple Vitamin (MULTIVITAMIN) tablet Take 1 tablet by mouth daily. Centrum Silver   Multiple Vitamins-Minerals (  CENTRUM ADULT 50+ MULTIGUMMIES) CHEW as directed Orally   Polyethylene Glycol 400 0.25 % SOLN Apply to eye.   polyethylene glycol powder (GLYCOLAX/MIRALAX) 17 GM/SCOOP powder Take 1 Container by mouth once.   tamoxifen (NOLVADEX) 20 MG tablet Take 1 tablet (20 mg total) by mouth daily.   vitamin B-12 (CYANOCOBALAMIN) 1000 MCG tablet Take 1,000 mcg by mouth daily.   Wheat Dextrin (BENEFIBER) POWD as directed Orally   zinc gluconate 50 MG tablet Take 50 mg by mouth daily.   No facility-administered encounter medications on file as of 03/02/2023.     ONCOLOGIC FAMILY HISTORY:   Family History  Problem Relation Age of Onset   Dementia Mother    Heart disease Father    Breast cancer Maternal Grandmother    Cancer Maternal Grandmother 41   Stroke Maternal Grandfather 26   Stroke Paternal Grandfather    Cancer Brother 69       ColoRectal Cancer   Cancer Brother 72       prostates to bone metz     SOCIAL HISTORY:  Social History   Socioeconomic History   Marital status: Widowed    Spouse name: Not on file   Number of children: 1   Years of education: Not on file   Highest education level: Not on file  Occupational History   Not on file  Tobacco Use   Smoking status: Never   Smokeless tobacco: Never  Vaping Use   Vaping status: Never Used  Substance and Sexual Activity   Alcohol use: No   Drug use: No   Sexual activity: Not Currently    Birth control/protection: Post-menopausal  Other Topics Concern   Not on file  Social History Narrative   Stays alone. Mother in memory care unit will be 20      Bryan (50) bio son      Clydie Braun (16)   Greg (56)      Hobbies: was taking care of her mother. Taking care of the house. Walks 2 miles a day She does have a 3 are yard that she mows weeds   Social Determinants of Health   Financial Resource Strain: Low Risk  (12/31/2022)   Overall Financial Resource Strain (CARDIA)    Difficulty of Paying Living Expenses: Not hard at all  Food Insecurity: No Food Insecurity (12/31/2022)   Hunger Vital Sign    Worried About Running Out of Food in the Last Year: Never true    Ran Out of Food in the Last Year: Never true  Transportation Needs: No Transportation Needs (12/31/2022)   PRAPARE - Administrator, Civil Service (Medical): No    Lack of Transportation (Non-Medical): No  Physical Activity: Sufficiently Active (12/31/2022)   Exercise Vital Sign    Days of Exercise per Week: 7 days    Minutes of Exercise per Session: 30 min  Stress: No Stress Concern Present (12/31/2022)   [employer] of  Occupational Health - Occupational Stress Questionnaire    Feeling of Stress : Not at all  Social Connections: Moderately Isolated (12/31/2022)   Social Connection and Isolation Panel [NHANES]    Frequency of Communication with Friends and Family: More than three times a week    Frequency of Social Gatherings with Friends and Family: More than three times a week    Attends Religious Services: More than 4 times per year    Active Member of [employer] or Organizations: No    Attends Banker Meetings:  Never    Marital Status: Widowed  Intimate Partner Violence: Not At Risk (12/31/2022)   Humiliation, Afraid, Rape, and Kick questionnaire    Fear of Current or Ex-Partner: No    Emotionally Abused: No    Physically Abused: No    Sexually Abused: No     OBSERVATIONS/OBJECTIVE:  BP 139/73 (BP Location: Left Arm, Patient Position: Sitting)   Pulse 77   Temp (!) 97.5 F (36.4 C) (Temporal)   Resp 18   Wt 147 lb 4.8 oz (66.8 kg)   SpO2 99%   BMI 26.94 kg/m  GENERAL: Patient is a well appearing female in no acute distress Neck: No palpable LN Breasts: Bilateral breasts inspected, no palpable masses or regional adenopathy.   LABORATORY DATA:  None for this visit.  DIAGNOSTIC IMAGING:  None for this visit.   ASSESSMENT AND PLAN:  Ms.. Maultsby is a pleasant 77 y.o. female with Stage 0 left breast DCIS, ER+/PR+, diagnosed in September 2023, treated with lumpectomy, adjuvant radiation therapy, and anti-estrogen therapy with tamoxifen beginning in December 2023.    Breast Cancer On Tamoxifen with occasional hot flashes, no other side effects. Mammogram follow-up scheduled for February 17th for right breast calcifications. -Continue Tamoxifen daily. -Continue regular mammogram follow-ups.  Lower Extremity Injury Healing with some residual swelling and pain. Patient is managing with ibuprofen, Tylenol, and ice. Orthopedic follow-up scheduled for Monday. -Continue current pain  management regimen. -Attend orthopedic follow-up appointment.  Osteopenia Patient is taking Vitamin D and Calcium. Tamoxifen also beneficial for bone density. Patient was previously active with walking and light weight lifting. -Continue Vitamin D and Calcium. -Resume walking and light weight lifting once lower extremity injury is healed.  Fall Risk Patient lives alone but has family nearby. Discussed the benefits of a life alert system. -Consider obtaining a life alert system or similar for safety.  General Health Maintenance -Ensure adequate hydration when taking ibuprofen. -Continue with current support from family for groceries and other needs. -Follow-up in six months.  Total time: 30 min  *Total Encounter Time as defined by the Centers for Medicare and Medicaid Services includes, in addition to the face-to-face time of a patient visit (documented in the note above) non-face-to-face time: obtaining and reviewing outside history, ordering and reviewing medications, tests or procedures, care coordination (communications with other health care professionals or caregivers) and documentation in the medical record.

## 2023-03-08 DIAGNOSIS — S82832D Other fracture of upper and lower end of left fibula, subsequent encounter for closed fracture with routine healing: Secondary | ICD-10-CM | POA: Diagnosis not present

## 2023-04-01 ENCOUNTER — Telehealth: Payer: Self-pay | Admitting: Nurse Practitioner

## 2023-04-01 MED ORDER — LEVOTHYROXINE SODIUM 25 MCG PO TABS: ORAL_TABLET | ORAL | 1 refills | Status: DC

## 2023-04-01 NOTE — Telephone Encounter (Signed)
Prescription Request  04/01/2023  LOV: 02/10/2023  What is the name of the medication or equipment? levothyroxine (SYNTHROID) 25 MCG tablet   Have you contacted your pharmacy to request a refill? Yes   Which pharmacy would you like this sent to?  [id number] Sheen, [place] - [address] [place] [address] Phone: [phone] Fax: [phone]    Patient notified that their request is being sent to the clinical staff for review and that they should receive a response within 2 business days.   Please advise at [facility] [phone]

## 2023-04-01 NOTE — Telephone Encounter (Signed)
LAST APPOINTMENT DATE: 02/10/2023   NEXT APPOINTMENT DATE: 01/06/2024  Levothyroxine 25 mcg     LAST REFILL: 11/03/2022  QTY: #90 1RF

## 2023-04-01 NOTE — Telephone Encounter (Signed)
Refill provided

## 2023-04-05 ENCOUNTER — Other Ambulatory Visit: Payer: Self-pay

## 2023-04-05 DIAGNOSIS — S82832D Other fracture of upper and lower end of left fibula, subsequent encounter for closed fracture with routine healing: Secondary | ICD-10-CM | POA: Diagnosis not present

## 2023-04-05 MED ORDER — TAMOXIFEN CITRATE 20 MG PO TABS: 20.0000 mg | ORAL_TABLET | Freq: Every day | ORAL | 3 refills | Status: DC

## 2023-05-03 DIAGNOSIS — S82832D Other fracture of upper and lower end of left fibula, subsequent encounter for closed fracture with routine healing: Secondary | ICD-10-CM | POA: Diagnosis not present

## 2023-06-08 DIAGNOSIS — K5900 Constipation, unspecified: Secondary | ICD-10-CM | POA: Diagnosis not present

## 2023-06-27 ENCOUNTER — Other Ambulatory Visit: Payer: Self-pay | Admitting: Nurse Practitioner

## 2023-06-27 NOTE — Progress Notes (Signed)
 Cardiology Office Note    Date:  06/30/2023   ID:  Maria Gomez, Maria Gomez 1946-03-15, MRN [id number]  PCP:  Wendee Lynwood HERO, NP  Cardiologist:  Dr. Alberto Pina  Chief Complaint  Patient presents with   Mitral Valve Prolapse    History of Present Illness:  Maria Gomez is a 78 y.o. female seen for followup Mitral insufficiency. She was intially evaluated on 11/03/17 for evaluation of a heart murmur. She has a  PMH of hypothyroidism and HLD but no prior cardiac history.  On the previous office visit with his primary care provider in March 2018, a heart murmur was heard.  Echocardiogram obtained on 09/01/2016 showed EF 60 to 65%, mild LVH with moderate focal basal septal hypertrophy, grade 1 DD, thickened mitral leaflet with posterior leaflet prolapse, mild MR, moderate LAE, PA peak pressure was 21 mmHg.  Carotid Doppler obtained on 09/04/2016 showed no significant disease bilaterally.  Echocardiogram was repeated on 09/20/2017, this showed EF 55 to 60%, bileaflet prolapse of mitral valve with at least moderate to severe MR directed anteriorly into the left atrium, PA peak pressure 35 mmHg. She subsequently underwent TEE showing bileaflet MVP with a flail P2 segment and severe MR. She subsequently underwent [facility] on 01/18/18 confirming severe MR. Normal coronary arteries. Normal right heart pressures.   She had minimally invasive mitral valve repair on 03/10/2018 by Dr. Dusty. She did very well with this and repeat Echo showed resolution of her MR.   On follow up she is doing very well. She did suffer a fall with left fibular fracture. This is being treated conservatively. She is walking regularly. No dyspnea, palpitations, chest pain.  Past Medical History:  Diagnosis Date   Cancer (HCC) 12/2021   left breast DCIS   History of radiation therapy    Left breast- 03/30/22-04/27/22-Dr. Lynwood Nasuti   Hypothyroidism    Mitral regurgitation    Osteopenia    Personal history of radiation  therapy    S/P minimally invasive mitral valve repair 03/10/2018   Complex valvuloplasty including triangular resection of posterior leaflet, artificial Gore-tex neochord x6 and Size 30 Sorin Memo 4D Ring SN [id number] via right mini thoracotomy approach   Thyroid  nodule 03/07/2018   Incidental - noted on CT scan, atypical cells found on fine-needle aspiration.  General surgery consultation and excision recommended February 2020    Past Surgical History:  Procedure Laterality Date   BREAST EXCISIONAL BIOPSY Bilateral    BREAST LUMPECTOMY Left 02/18/2022   dcis   BREAST LUMPECTOMY WITH RADIOACTIVE SEED LOCALIZATION Left 02/18/2022   Procedure: LEFT BREAST LUMPECTOMY WITH RADIOACTIVE SEED LOCALIZATION;  Surgeon: Curvin Deward MOULD, MD;  Location: [facility];  Service: General;  Laterality: Left;   MITRAL VALVE REPAIR Right 03/10/2018   Procedure: MINIMALLY INVASIVE MITRAL VALVE REPAIR (MVR):  -Triangular Resection of Posterior Leaflet P2; -Shortening Plasty of Posterior Leaflet P2; -Placement of Goretex Neo Chords x 6 using Chord-X system; -Ring Annuloplasty with a 30 mm Sorin Memo 4D Ring;  Surgeon: Dusty Sudie DEL, MD;  Location: [facility] OR;  Service: Open Heart Surgery;  Laterality: Right;   RIGHT/LEFT HEART CATH AND CORONARY ANGIOGRAPHY N/A 01/18/2018   Procedure: RIGHT/LEFT HEART CATH AND CORONARY ANGIOGRAPHY;  Surgeon: Tomasz Steeves M, MD;  Location: [facility] INVASIVE CV LAB;  Service: Cardiovascular;  Laterality: N/A;   TEE WITHOUT CARDIOVERSION N/A 11/09/2017   Procedure: TRANSESOPHAGEAL ECHOCARDIOGRAM (TEE);  Surgeon: Rolan Ezra RAMAN, MD;  Location: [facility] ENDOSCOPY;  Service: Cardiovascular;  Laterality: N/A;   TEE WITHOUT CARDIOVERSION N/A 03/10/2018   Procedure: TRANSESOPHAGEAL ECHOCARDIOGRAM (TEE);  Surgeon: Dusty Sudie DEL, MD;  Location: [facility] OR;  Service: Open Heart Surgery;  Laterality: N/A;    Current Medications: Outpatient Medications Prior to Visit  Medication Sig Dispense  Refill   amoxicillin (AMOXIL) 500 MG capsule SMARTSIG:4 Capsule(s) By Mouth     atorvastatin  (LIPITOR) 20 MG tablet TAKE 1 TABLET BY MOUTH EVERY DAY 90 tablet 1   Biotin 5000 MCG TABS Take 5,000 mcg by mouth daily.      Calcium  Carb-Cholecalciferol (CALTRATE 600+D3) 600-20 MG-MCG TABS 2 tablet with a meal     Cholecalciferol (D3) 25 MCG (1000 UT) capsule 1 capsule Orally Once a day     Coenzyme Q10 (CO Q 10 PO) Take 800 mg by mouth daily.      levothyroxine  (SYNTHROID ) 25 MCG tablet TAKE 1 TABLET BY MOUTH EVERY DAY BEFORE BREAKFAST 90 tablet 1   Melatonin 10-10 MG TBCR 2 tablet at bedtime as needed     Multiple Vitamin (MULTIVITAMIN) tablet Take 1 tablet by mouth daily. Centrum Silver     Polyethylene Glycol 400 0.25 % SOLN Apply to eye.     polyethylene glycol powder (GLYCOLAX/MIRALAX) 17 GM/SCOOP powder Take 1 Container by mouth once.     tamoxifen  (NOLVADEX ) 20 MG tablet Take 1 tablet (20 mg total) by mouth daily. 90 tablet 3   vitamin B-12 (CYANOCOBALAMIN ) 1000 MCG tablet Take 1,000 mcg by mouth daily.     Wheat Dextrin (BENEFIBER) POWD as directed Orally     zinc gluconate 50 MG tablet Take 50 mg by mouth daily.     diphenhydrAMINE HCl, Sleep, (UNISOM SLEEPGELS) 50 MG CAPS 1 capsule at bedtime as needed (Patient not taking: Reported on 06/30/2023)     Fiber Adult Gummies 2 g CHEW 2 tablets (Patient not taking: Reported on 06/30/2023)     Multiple Vitamins-Minerals (CENTRUM ADULT 50+ MULTIGUMMIES) CHEW as directed Orally (Patient not taking: Reported on 06/30/2023)     No facility-administered medications prior to visit.     Allergies:   Niaspan [niacin er (antihyperlipidemic)], Phenazopyridine hcl, and Statins   Social History   Socioeconomic History   Marital status: Widowed    Spouse name: Not on file   Number of children: 1   Years of education: Not on file   Highest education level: Not on file  Occupational History   Not on file  Tobacco Use   Smoking status: Never    Smokeless tobacco: Never  Vaping Use   Vaping status: Never Used  Substance and Sexual Activity   Alcohol use: No   Drug use: No   Sexual activity: Not Currently    Birth control/protection: Post-menopausal  Other Topics Concern   Not on file  Social History Narrative   Stays alone. Mother in memory care unit will be 86      Bryan (50) bio son      Darice (38)   Greg (56)      Hobbies: was taking care of her mother. Taking care of the house. Walks 2 miles a day She does have a 3 are yard that she mows weeds   Social Drivers of Corporate Investment Banker Strain: Low Risk  (12/31/2022)   Overall Financial Resource Strain (CARDIA)    Difficulty of Paying Living Expenses: Not hard at all  Food Insecurity: No Food Insecurity (12/31/2022)   Hunger Vital Sign    Worried About Running  Out of Food in the Last Year: Never true    Ran Out of Food in the Last Year: Never true  Transportation Needs: No Transportation Needs (12/31/2022)   PRAPARE - Administrator, Civil Service (Medical): No    Lack of Transportation (Non-Medical): No  Physical Activity: Sufficiently Active (12/31/2022)   Exercise Vital Sign    Days of Exercise per Week: 7 days    Minutes of Exercise per Session: 30 min  Stress: No Stress Concern Present (12/31/2022)   [employer] of Occupational Health - Occupational Stress Questionnaire    Feeling of Stress : Not at all  Social Connections: Moderately Isolated (12/31/2022)   Social Connection and Isolation Panel [NHANES]    Frequency of Communication with Friends and Family: More than three times a week    Frequency of Social Gatherings with Friends and Family: More than three times a week    Attends Religious Services: More than 4 times per year    Active Member of [employer] or Organizations: No    Attends Banker Meetings: Never    Marital Status: Widowed     Family History:  The patient's family history includes Breast cancer in her maternal  grandmother; Cancer (age of onset: 20) in her brother; Cancer (age of onset: 51) in her brother; Cancer (age of onset: 45) in her maternal grandmother; Dementia in her mother; Heart disease in her father; Stroke in her paternal grandfather; Stroke (age of onset: 5) in her maternal grandfather.   ROS:   Please see the history of present illness.    ROS All other systems reviewed and are negative.   PHYSICAL EXAM:   VS:  BP 108/72   Pulse 75   Ht 5' 2 (1.575 m)   Wt 141 lb (64 kg)   SpO2 99%   BMI 25.79 kg/m    GENERAL:  NAD HEENT:  normal.  NECK:  No jugular venous distention, carotid upstroke brisk and symmetric, no bruits, no thyromegaly or adenopathy LUNGS:  Clear to auscultation bilaterally CHEST:  Unremarkable HEART:  RRR,  PMI not displaced or sustained,S1 and S2 within normal limits, no S3, no S4: no clicks, no rubs, no murmur ABD:  Soft, nontender. BS +, no masses or bruits. No hepatomegaly, no splenomegaly EXT:  2 + pulses throughout, no edema, no cyanosis no clubbing. Radial cath site has healed well. SKIN:  Warm and dry.  No rashes NEURO:  Alert and oriented x 3. Cranial nerves II through XII intact. PSYCH:  Cognitively intact    Wt Readings from Last 3 Encounters:  06/30/23 141 lb (64 kg)  03/02/23 147 lb 4.8 oz (66.8 kg)  02/10/23 146 lb 9.6 oz (66.5 kg)      Studies/Labs Reviewed:   EKG Interpretation Date/Time:  Wednesday June 30 2023 10:09:03 EST Ventricular Rate:  71 PR Interval:  152 QRS Duration:  68 QT Interval:  414 QTC Calculation: 449 R Axis:   -5  Text Interpretation: Sinus rhythm with Premature atrial complexes When compared with ECG of Jul 03, 2022 Premature atrial complexes are now Present Confirmed by Aolanis Crispen [phone]) on 06/30/2023 10:27:10 AM     Recent Labs: 01/19/2023: ALT 14; BUN 15; Creatinine, Ser 1.07; Hemoglobin 14.0; Platelets 220.0; Potassium 4.4; Sodium 139; TSH 3.14   Lipid Panel    Component Value Date/Time   CHOL  138 01/19/2023 1221   TRIG 279.0 (H) 01/19/2023 1221   HDL 49.70 01/19/2023 1221   CHOLHDL  3 01/19/2023 1221   VLDL 55.8 (H) 01/19/2023 1221   LDLCALC 69 03/09/2022 0954   LDLDIRECT 66.0 01/19/2023 1221    Additional studies/ records that were reviewed today include:   Echo 09/01/2016 LV EF: 60% -   65% Study Conclusions   - Left ventricle: The cavity size was normal. Wall thickness was   increased in a pattern of mild LVH. There was moderate focal   basal hypertrophy of the septum. Systolic function was normal.   The estimated ejection fraction was in the range of 60% to 65%.   Wall motion was normal; there were no regional wall motion   abnormalities. Doppler parameters are consistent with abnormal   left ventricular relaxation (grade 1 diastolic dysfunction). The   E/e&' ratio is >15, suggesting elevated LV filling pressure. - Aortic valve: Trileaflet. Sclerosis without stenosis. There was   no regurgitation. - Mitral valve: Thickened leaflets with posterior leaflet prolapse.   Trace to mild regurgitation. - Left atrium: The atrium was moderately dilated. - Tricuspid valve: There was trivial regurgitation. - Pulmonary arteries: PA peak pressure: 21 mm Hg (S). - Inferior vena cava: The vessel was normal in size. The   respirophasic diameter changes were in the normal range (>= 50%),   consistent with normal central venous pressure.   Impressions:   - LVEF 60-65%, mild LVH with moderate focal basal septal   hypertrophy, normal wall motion, grade 1 DD with elevated LV   filling pressure, aortic valve sclerosis, posterior mitral valve   leaflet prolapse with mild to moderate anteriorly directed mitral   regurgitation, moderate LAE, trivial TR, RVSP 21 mmHg, normal   IVC.   Echo 09/20/2017 LV EF: 55% -   60% Study Conclusions   - Left ventricle: The cavity size was normal. Wall thickness was   increased in a pattern of mild LVH. Systolic function was normal.   The  estimated ejection fraction was in the range of 55% to 60%. - Mitral valve: There is bileaflet prolapse of the MV MR is   directed anteirorly into the left atrium IT is at least   moderately severe . - Left atrium: The atrium was mildly dilated. - Pulmonary arteries: PA peak pressure: 35 mm Hg (S).  TEE 11/09/17: Study Conclusions   - Left ventricle: The cavity size was normal. Wall thickness was   normal. The estimated ejection fraction was 60%. Wall motion was   normal; there were no regional wall motion abnormalities. - Aortic valve: There was no stenosis. There was trivial   regurgitation. - Aorta: Normal caliber aorta with minimal plaque. - Left atrium: The atrium was mildly dilated. No evidence of   thrombus in the atrial cavity or appendage. - Right ventricle: The cavity size was normal. Systolic function   was normal. - Right atrium: No evidence of thrombus in the atrial cavity or   appendage. - Atrial septum: No ASD/PFO by color doppler. - Tricuspid valve: Peak RV-RA gradient (S): 18 mm Hg. - Impressions: Severe mitral regurgitation in setting of bileaflet   MVP and partial flail P2 scallop.   Impressions:   - Severe mitral regurgitation in setting of bileaflet MVP and   partial flail P2 scallop.  Cardiac cath 01/18/18: Procedures   RIGHT/LEFT HEART CATH AND CORONARY ANGIOGRAPHY  Conclusion     The left ventricular systolic function is normal. LV end diastolic pressure is normal. The left ventricular ejection fraction is 55-65% by visual estimate. There is severe (4+) mitral regurgitation  and moderate mitral valve prolapse.   1. Normal coronary anatomy 2. Normal LV function 3. Mitral valve prolapse with severe MR 3-4+ 4. Normal right heart pressures 5. Normal LV filling pressures   No indication for antiplatelet therapy at this time.    Will refer to Dr. Dusty for consideration of MV repair   Minimally-Invasive Mitral Valve Repair             Complex  valvuloplasty including triangular resection of flail segment (P2) of posterior leaflet             Shortening plasty of posterior leaflet             Artificial Gore-tex neochord placement x6             Sorin Memo 4D Ring Annuloplasty (size 30mm, catalog # [id number], serial # [id number])   Echo 05/12/18: Study Conclusions   - Left ventricle: The cavity size was normal. Wall thickness was    normal. Systolic function was normal. The estimated ejection    fraction was in the range of 50% to 55%. Wall motion was normal;    there were no regional wall motion abnormalities. Doppler    parameters are consistent with abnormal left ventricular    relaxation (grade 1 diastolic dysfunction).  - Aortic valve: There was trivial regurgitation.  - Mitral valve: Prior procedures included surgical repair.   Impressions:   - Low normal LV systolic function; mild diastolic dysfunction;    trace AI; s/p MV repair with no MR.   ASSESSMENT:    1. Screening for cardiovascular condition       PLAN:  In order of problems listed above:  Mitral valve regurgitation secondary to partially flail P2 segment of the MV with MV prolapse. Now s/p MV repair in October 2019. Follow up Echo showed excellent result. Exam is normal. Clinically doing very well.   Hyperlipidemia: excellent control on lipitor. Needs to reduce sweet intake to treat triglycerides.   Hypothyroidism: TSH normal.   Follow up in one year.      Medication Adjustments/Labs and Tests Ordered: Current medicines are reviewed at length with the patient today.  Concerns regarding medicines are outlined above.  Medication changes, Labs and Tests ordered today are listed in the Patient Instructions below. Patient Instructions  Medication Instructions:  Continue same medications *If you need a refill on your cardiac medications before your next appointment, please call your pharmacy*   Lab Work: None ordered   Testing/Procedures: None  ordered   Follow-Up: At [facility], you and your health needs are our priority.  As part of our continuing mission to provide you with exceptional heart care, we have created designated Provider Care Teams.  These Care Teams include your primary Cardiologist (physician) and Advanced Practice Providers (APPs -  Physician Assistants and Nurse Practitioners) who all work together to provide you with the care you need, when you need it.  We recommend signing up for the patient portal called MyChart.  Sign up information is provided on this After Visit Summary.  MyChart is used to connect with patients for Virtual Visits (Telemedicine).  Patients are able to view lab/test results, encounter notes, upcoming appointments, etc.  Non-urgent messages can be sent to your provider as well.   To learn more about what you can do with MyChart, go to [email or web address].    Your next appointment:  1 year   Call in Oct to schedule Feb appointment     Provider:  Dr.Keaghan Staton          Signed, Demareon Coldwell, MD  06/30/2023 10:31 AM    [facility] [address] [place], [place], [place]  [address] Phone: [phone]; Fax: [phone]

## 2023-06-30 ENCOUNTER — Ambulatory Visit: Payer: Medicare HMO | Attending: Cardiology | Admitting: Cardiology

## 2023-06-30 ENCOUNTER — Encounter: Payer: Self-pay | Admitting: Cardiology

## 2023-06-30 DIAGNOSIS — Z9889 Other specified postprocedural states: Secondary | ICD-10-CM

## 2023-06-30 DIAGNOSIS — E785 Hyperlipidemia, unspecified: Secondary | ICD-10-CM | POA: Diagnosis not present

## 2023-06-30 DIAGNOSIS — Z136 Encounter for screening for cardiovascular disorders: Secondary | ICD-10-CM | POA: Diagnosis not present

## 2023-06-30 DIAGNOSIS — I340 Nonrheumatic mitral (valve) insufficiency: Secondary | ICD-10-CM

## 2023-06-30 NOTE — Patient Instructions (Signed)
 Medication Instructions:  Continue same medications *If you need a refill on your cardiac medications before your next appointment, please call your pharmacy*   Lab Work: None ordered   Testing/Procedures: None ordered   Follow-Up: At [facility], you and your health needs are our priority.  As part of our continuing mission to provide you with exceptional heart care, we have created designated Provider Care Teams.  These Care Teams include your primary Cardiologist (physician) and Advanced Practice Providers (APPs -  Physician Assistants and Nurse Practitioners) who all work together to provide you with the care you need, when you need it.  We recommend signing up for the patient portal called "MyChart".  Sign up information is provided on this After Visit Summary.  MyChart is used to connect with patients for Virtual Visits (Telemedicine).  Patients are able to view lab/test results, encounter notes, upcoming appointments, etc.  Non-urgent messages can be sent to your provider as well.   To learn more about what you can do with MyChart, go to [email or web address].    Your next appointment:  1 year    Call in Oct to schedule Feb appointment     Provider: Dr.Jordan

## 2023-07-12 ENCOUNTER — Ambulatory Visit
Admission: RE | Admit: 2023-07-12 | Discharge: 2023-07-12 | Disposition: A | Discharge status: Home / Self Care | Payer: Medicare HMO | Source: Ambulatory Visit | Attending: Hematology and Oncology

## 2023-07-12 ENCOUNTER — Other Ambulatory Visit: Payer: Self-pay | Admitting: Hematology and Oncology

## 2023-07-12 DIAGNOSIS — R921 Mammographic calcification found on diagnostic imaging of breast: Secondary | ICD-10-CM

## 2023-07-12 DIAGNOSIS — R92331 Mammographic heterogeneous density, right breast: Secondary | ICD-10-CM | POA: Diagnosis not present

## 2023-07-14 ENCOUNTER — Other Ambulatory Visit: Payer: Self-pay | Admitting: Hematology and Oncology

## 2023-07-14 DIAGNOSIS — D0512 Intraductal carcinoma in situ of left breast: Secondary | ICD-10-CM

## 2023-07-14 NOTE — Progress Notes (Signed)
 Repeat mammogram ordered, 6 month follow up recommended.  Maria Gomez

## 2023-07-26 ENCOUNTER — Ambulatory Visit: Payer: Medicare HMO | Admitting: Dermatology

## 2023-08-16 DIAGNOSIS — Z800 Family history of malignant neoplasm of digestive organs: Secondary | ICD-10-CM | POA: Diagnosis not present

## 2023-08-16 DIAGNOSIS — Z09 Encounter for follow-up examination after completed treatment for conditions other than malignant neoplasm: Secondary | ICD-10-CM | POA: Diagnosis not present

## 2023-08-16 DIAGNOSIS — Z860101 Personal history of adenomatous and serrated colon polyps: Secondary | ICD-10-CM | POA: Diagnosis not present

## 2023-08-16 DIAGNOSIS — K5730 Diverticulosis of large intestine without perforation or abscess without bleeding: Secondary | ICD-10-CM | POA: Diagnosis not present

## 2023-08-16 LAB — HM COLONOSCOPY

## 2023-08-30 NOTE — Progress Notes (Unsigned)
 BRIEF ONCOLOGIC HISTORY:  Oncology History  Ductal carcinoma in situ (DCIS) of left breast  12/30/2021 Mammogram   Screening mammogram showed new calcifications in the left breast warranting further evaluation, no findings suspicious for malignancy in the right breast.  She then had a diagnostic mammogram as well as biopsy.   01/28/2022 Pathology Results   Left breast needle core biopsy showed DCIS, grade 2-3 out of 3, ER 100% positive strong staining PR 70% positive strong staining   02/18/2022 Definitive Surgery   Left breast lumpectomy on September 27 showed DCIS, intermediate to high nuclear grade, negative for invasive carcinoma, margins free of carcinoma fibrocystic changes.  Prognostics not repeated   02/18/2022 Cancer Staging   Staging form: Breast, AJCC 8th Edition - Pathologic stage from 02/18/2022: Stage 0 (pTis (DCIS), pN0, cM0) - Signed by Loa Socks, NP on 08/04/2022 Stage prefix: Initial diagnosis   03/30/2022 - 04/27/2022 Radiation Therapy   Plan Name: Breast_L_BH Site: Breast, Left Technique: 3D Mode: Photon Dose Per Fraction: 2.67 Gy Prescribed Dose (Delivered / Prescribed): 40.05 Gy / 40.05 Gy Prescribed Fxs (Delivered / Prescribed): 15 / 15   Plan Name: Brst_L_BH_Bst Site: Breast, Left Technique: 3D Mode: Photon Dose Per Fraction: 2 Gy Prescribed Dose (Delivered / Prescribed): 10 Gy / 10 Gy Prescribed Fxs (Delivered / Prescribed): 5 / 5   04/2022 -  Anti-estrogen oral therapy   Tamoxifen x 5 years     INTERVAL HISTORY:   Discussed the use of AI scribe software for clinical note transcription with the patient, who gave verbal consent to proceed.  History of Present Illness   The patient, with a history of breast cancer and osteopenia, here for follow up on tamoxifen.  Discussed the use of AI scribe software for clinical note transcription with the patient, who gave verbal consent to proceed.  History of Present Illness Maria Gomez  "Maria Gomez" is a 78 year old female who presents for follow-up on tamoxifen therapy and mammogram results.  She had a mammogram in February which showed calcifications in the right breast, believed to be benign. A follow-up mammogram is scheduled for August 18th.  She is currently taking tamoxifen daily without any side effects. Her bone density was last assessed in December 2023, revealing osteopenia. She is engaging in weight-bearing exercises and taking vitamin D and calcium to manage her bone health.  She recently underwent a colonoscopy which showed no signs of cancer or polyps. She has been having colonoscopies every five years due to her brother's history of colorectal cancer, but was informed that she no longer needs to continue with them as she is now 78 years old.  She recalls a previous fall resulting in a broken leg and has considered obtaining a fall alert system, although she has not yet done so. No current swelling in her legs and denies any recent falls.    REVIEW OF SYSTEMS:  Review of Systems  Constitutional:  Negative for appetite change, chills, fatigue, fever and unexpected weight change.  HENT:   Negative for hearing loss, lump/mass and trouble swallowing.   Eyes:  Negative for eye problems and icterus.  Respiratory:  Negative for chest tightness, cough and shortness of breath.   Cardiovascular:  Negative for chest pain, leg swelling and palpitations.  Gastrointestinal:  Negative for abdominal distention, abdominal pain, constipation, diarrhea, nausea and vomiting.  Endocrine: Negative for hot flashes.  Genitourinary:  Negative for difficulty urinating.   Musculoskeletal:  Negative for arthralgias.  Skin:  Negative  for itching and rash.  Neurological:  Negative for dizziness, extremity weakness, headaches and numbness.  Hematological:  Negative for adenopathy. Does not bruise/bleed easily.  Psychiatric/Behavioral:  Negative for depression. The patient is not nervous/anxious.    Breast: Denies any new nodularity, masses, tenderness, nipple changes, or nipple discharge.      PAST MEDICAL/SURGICAL HISTORY:  Past Medical History:  Diagnosis Date   Cancer (HCC) 12/2021   left breast DCIS   History of radiation therapy    Left breast- 03/30/22-04/27/22-Dr. Antony Blackbird   Hypothyroidism    Mitral regurgitation    Osteopenia    Personal history of radiation therapy    S/P minimally invasive mitral valve repair 03/10/2018   Complex valvuloplasty including triangular resection of posterior leaflet, artificial Gore-tex neochord x6 and Size 30 Sorin Memo 4D Ring SN [id number] via right mini thoracotomy approach   Thyroid nodule 03/07/2018   Incidental - noted on CT scan, atypical cells found on fine-needle aspiration.  General surgery consultation and excision recommended February 2020   Past Surgical History:  Procedure Laterality Date   BREAST EXCISIONAL BIOPSY Bilateral    BREAST LUMPECTOMY Left 02/18/2022   dcis   BREAST LUMPECTOMY WITH RADIOACTIVE SEED LOCALIZATION Left 02/18/2022   Procedure: LEFT BREAST LUMPECTOMY WITH RADIOACTIVE SEED LOCALIZATION;  Surgeon: Griselda Miner, MD;  Location: [facility];  Service: General;  Laterality: Left;   MITRAL VALVE REPAIR Right 03/10/2018   Procedure: MINIMALLY INVASIVE MITRAL VALVE REPAIR (MVR):  -Triangular Resection of Posterior Leaflet P2; -Shortening Plasty of Posterior Leaflet P2; -Placement of Goretex Neo Chords x 6 using Chord-X system; -Ring Annuloplasty with a 30 mm Sorin Memo 4D Ring;  Surgeon: Purcell Nails, MD;  Location: [facility] OR;  Service: Open Heart Surgery;  Laterality: Right;   RIGHT/LEFT HEART CATH AND CORONARY ANGIOGRAPHY N/A 01/18/2018   Procedure: RIGHT/LEFT HEART CATH AND CORONARY ANGIOGRAPHY;  Surgeon: [place], Peter M, MD;  Location: [facility] INVASIVE CV LAB;  Service: Cardiovascular;  Laterality: N/A;   TEE WITHOUT CARDIOVERSION N/A 11/09/2017   Procedure: TRANSESOPHAGEAL ECHOCARDIOGRAM  (TEE);  Surgeon: Laurey Morale, MD;  Location: [facility] ENDOSCOPY;  Service: Cardiovascular;  Laterality: N/A;   TEE WITHOUT CARDIOVERSION N/A 03/10/2018   Procedure: TRANSESOPHAGEAL ECHOCARDIOGRAM (TEE);  Surgeon: Purcell Nails, MD;  Location: [facility] OR;  Service: Open Heart Surgery;  Laterality: N/A;     ALLERGIES:  Allergies  Allergen Reactions   Niaspan [Niacin Er (Antihyperlipidemic)] Other (See Comments)    Severe flushing   Phenazopyridine Hcl Other (See Comments)    Made sick    Statins     Severe flushing     CURRENT MEDICATIONS:  Outpatient Encounter Medications as of 08/31/2023  Medication Sig   amoxicillin (AMOXIL) 500 MG capsule SMARTSIG:4 Capsule(s) By Mouth   atorvastatin (LIPITOR) 20 MG tablet TAKE 1 TABLET BY MOUTH EVERY DAY   Biotin 5000 MCG TABS Take 5,000 mcg by mouth daily.    Calcium Carb-Cholecalciferol (CALTRATE 600+D3) 600-20 MG-MCG TABS 2 tablet with a meal   Cholecalciferol (D3) 25 MCG (1000 UT) capsule 1 capsule Orally Once a day   Coenzyme Q10 (CO Q 10 PO) Take 800 mg by mouth daily.    diphenhydrAMINE HCl, Sleep, (UNISOM SLEEPGELS) 50 MG CAPS 1 capsule at bedtime as needed (Patient not taking: Reported on 06/30/2023)   levothyroxine (SYNTHROID) 25 MCG tablet TAKE 1 TABLET BY MOUTH EVERY DAY BEFORE BREAKFAST   Melatonin 10-10 MG TBCR 2 tablet at bedtime as needed  Multiple Vitamin (MULTIVITAMIN) tablet Take 1 tablet by mouth daily. Centrum Silver   Polyethylene Glycol 400 0.25 % SOLN Apply to eye.   polyethylene glycol powder (GLYCOLAX/MIRALAX) 17 GM/SCOOP powder Take 1 Container by mouth once.   tamoxifen (NOLVADEX) 20 MG tablet Take 1 tablet (20 mg total) by mouth daily.   vitamin B-12 (CYANOCOBALAMIN) 1000 MCG tablet Take 1,000 mcg by mouth daily.   Wheat Dextrin (BENEFIBER) POWD as directed Orally   zinc gluconate 50 MG tablet Take 50 mg by mouth daily.   No facility-administered encounter medications on file as of 08/31/2023.     ONCOLOGIC FAMILY  HISTORY:  Family History  Problem Relation Age of Onset   Dementia Mother    Heart disease Father    Breast cancer Maternal Grandmother    Cancer Maternal Grandmother 28   Stroke Maternal Grandfather 65   Stroke Paternal Grandfather    Cancer Brother 15       ColoRectal Cancer   Cancer Brother 72       prostates to bone metz     SOCIAL HISTORY:  Social History   Socioeconomic History   Marital status: Widowed    Spouse name: Not on file   Number of children: 1   Years of education: Not on file   Highest education level: Not on file  Occupational History   Not on file  Tobacco Use   Smoking status: Never   Smokeless tobacco: Never  Vaping Use   Vaping status: Never Used  Substance and Sexual Activity   Alcohol use: No   Drug use: No   Sexual activity: Not Currently    Birth control/protection: Post-menopausal  Other Topics Concern   Not on file  Social History Narrative   Stays alone. Mother in memory care unit will be 27      Bryan (50) bio son      Clydie Braun (40)   Greg (56)      Hobbies: was taking care of her mother. Taking care of the house. Walks 2 miles a day She does have a 3 are yard that she mows weeds   Social Drivers of Corporate investment banker Strain: Low Risk  (12/31/2022)   Overall Financial Resource Strain (CARDIA)    Difficulty of Paying Living Expenses: Not hard at all  Food Insecurity: No Food Insecurity (12/31/2022)   Hunger Vital Sign    Worried About Running Out of Food in the Last Year: Never true    Ran Out of Food in the Last Year: Never true  Transportation Needs: No Transportation Needs (12/31/2022)   PRAPARE - Administrator, Civil Service (Medical): No    Lack of Transportation (Non-Medical): No  Physical Activity: Sufficiently Active (12/31/2022)   Exercise Vital Sign    Days of Exercise per Week: 7 days    Minutes of Exercise per Session: 30 min  Stress: No Stress Concern Present (12/31/2022)   [employer] of  Occupational Health - Occupational Stress Questionnaire    Feeling of Stress : Not at all  Social Connections: Moderately Isolated (12/31/2022)   Social Connection and Isolation Panel [NHANES]    Frequency of Communication with Friends and Family: More than three times a week    Frequency of Social Gatherings with Friends and Family: More than three times a week    Attends Religious Services: More than 4 times per year    Active Member of Clubs or Organizations: No    Attends  Club or Organization Meetings: Never    Marital Status: Widowed  Intimate Partner Violence: Not At Risk (12/31/2022)   Humiliation, Afraid, Rape, and Kick questionnaire    Fear of Current or Ex-Partner: No    Emotionally Abused: No    Physically Abused: No    Sexually Abused: No     OBSERVATIONS/OBJECTIVE:  There were no vitals taken for this visit. GENERAL: Patient is a well appearing female in no acute distress Neck: No palpable LN Breasts: Bilateral breasts inspected, no palpable masses or regional adenopathy. No LE edema CTA bilaterally.   LABORATORY DATA:  None for this visit.  DIAGNOSTIC IMAGING:  None for this visit.   ASSESSMENT AND PLAN:  Ms.. Topete is a pleasant 78 y.o. female with Stage 0 left breast DCIS, ER+/PR+, diagnosed in September 2023, treated with lumpectomy, adjuvant radiation therapy, and anti-estrogen therapy with tamoxifen beginning in December 2023.   Assessment & Plan Breast calcifications Calcifications in the right breast suspected benign. Follow-up mammogram scheduled to confirm. - Perform follow-up mammogram on August 18th. - If calcifications confirmed benign, transition to annual mammograms.  Tamoxifen therapy On tamoxifen with no side effects. Discussed risks of thromboembolism and endometrial changes. Benefits include potential bone density improvement. Informed to monitor for complications. - Continue tamoxifen therapy. - Monitor for signs of thromboembolism and  report sudden leg swelling, chest pain, or dyspnea. - Report any vaginal bleeding immediately.  Osteopenia Osteopenia diagnosed. Tamoxifen may improve bone density. Recommended exercises and supplements. - Continue weight-bearing exercises. - Continue vitamin D and calcium supplementation.  Fall risk Fall resulted in broken leg. Discussed fall alert system for safety when alone. - Consider obtaining a fall alert system for safety.  Colorectal cancer screening Recent colonoscopy showed no cancer or polyps. No further colonoscopies required as per guidelines.  Follow-up Coordinating follow-up care with breast surgeon Dr. Chevis Pretty. Alternating visits every six months. - Coordinate with Dr. Chevis Pretty to schedule follow-up in the fall. - Plan to see her in spring of next year.  Total time: 30 min  *Total Encounter Time as defined by the Centers for Medicare and Medicaid Services includes, in addition to the face-to-face time of a patient visit (documented in the note above) non-face-to-face time: obtaining and reviewing outside history, ordering and reviewing medications, tests or procedures, care coordination (communications with other health care professionals or caregivers) and documentation in the medical record.

## 2023-08-31 ENCOUNTER — Inpatient Hospital Stay: Payer: Medicare HMO | Attending: Hematology and Oncology | Admitting: Hematology and Oncology

## 2023-08-31 VITALS — BP 111/64 | HR 84 | Temp 97.7°F | Resp 18 | Wt 139.9 lb

## 2023-08-31 DIAGNOSIS — Z79899 Other long term (current) drug therapy: Secondary | ICD-10-CM | POA: Diagnosis not present

## 2023-08-31 DIAGNOSIS — Z79810 Long term (current) use of selective estrogen receptor modulators (SERMs): Secondary | ICD-10-CM | POA: Diagnosis not present

## 2023-08-31 DIAGNOSIS — Z923 Personal history of irradiation: Secondary | ICD-10-CM | POA: Insufficient documentation

## 2023-08-31 DIAGNOSIS — M8580 Other specified disorders of bone density and structure, unspecified site: Secondary | ICD-10-CM | POA: Insufficient documentation

## 2023-08-31 DIAGNOSIS — D0512 Intraductal carcinoma in situ of left breast: Secondary | ICD-10-CM | POA: Diagnosis not present

## 2023-09-01 ENCOUNTER — Telehealth: Payer: Self-pay | Admitting: Hematology and Oncology

## 2023-09-01 NOTE — Telephone Encounter (Signed)
 Spoke with patient confirming upcoming appointment

## 2023-09-24 ENCOUNTER — Other Ambulatory Visit: Payer: Self-pay | Admitting: Nurse Practitioner

## 2023-12-18 ENCOUNTER — Other Ambulatory Visit: Payer: Self-pay | Admitting: Nurse Practitioner

## 2024-01-07 ENCOUNTER — Ambulatory Visit: Payer: Medicare HMO

## 2024-01-07 VITALS — Ht 62.0 in | Wt 139.0 lb

## 2024-01-07 DIAGNOSIS — Z0000 Encounter for general adult medical examination without abnormal findings: Secondary | ICD-10-CM

## 2024-01-07 NOTE — Progress Notes (Unsigned)
 Subjective:   Maria Gomez is a 78 y.o. who presents for a Medicare Wellness preventive visit.  As a reminder, Annual Wellness Visits don't include a physical exam, and some assessments may be limited, especially if this visit is performed virtually. We may recommend an in-person follow-up visit with your provider if needed.  Visit Complete: Virtual I connected with  Maria Gomez on 01/07/24 by a audio enabled telemedicine application and verified that I am speaking with the correct person using two identifiers.  Patient Location: Home  Provider Location: Office/Clinic  I discussed the limitations of evaluation and management by telemedicine. The patient expressed understanding and agreed to proceed.  Vital Signs: Because this visit was a virtual/telehealth visit, some criteria may be missing or patient reported. Any vitals not documented were not able to be obtained and vitals that have been documented are patient reported.  VideoDeclined- This patient declined Librarian, academic. Therefore the visit was completed with audio only.  Persons Participating in Visit: Patient.  AWV Questionnaire: No: Patient Medicare AWV questionnaire was not completed prior to this visit.  Cardiac Risk Factors include: advanced age (>92men, >26 women);dyslipidemia     Objective:    Today's Vitals   01/07/24 1013  Weight: 139 lb (63 kg)  Height: 5' 2 (1.575 m)   Body mass index is 25.42 kg/m.     01/07/2024   10:23 AM 12/31/2022   10:17 AM 08/04/2022   12:06 PM 05/28/2022   10:48 AM 03/18/2022   12:32 PM 02/18/2022   12:00 PM 02/16/2022    1:27 PM  Advanced Directives  Does Patient Have a Medical Advance Directive? Yes Yes Yes Yes Yes Yes Yes  Type of Estate agent of [place];Living will Healthcare Power of [place];Living will Healthcare Power of [place];Living will Living will Healthcare Power of [place];Living will  Healthcare Power of [place];Living will Healthcare Power of [place];Living will  Does patient want to make changes to medical advance directive?  No - Patient declined No - Patient declined   No - Patient declined   Copy of Healthcare Power of Attorney in Chart? Yes - validated most recent copy scanned in chart (See row information) Yes - validated most recent copy scanned in chart (See row information)  No - copy requested  No - copy requested No - copy requested    Current Medications (verified) Outpatient Encounter Medications as of 01/07/2024  Medication Sig   amoxicillin (AMOXIL) 500 MG capsule SMARTSIG:4 Capsule(s) By Mouth   atorvastatin  (LIPITOR) 20 MG tablet TAKE 1 TABLET BY MOUTH EVERY DAY   Biotin 5000 MCG TABS Take 5,000 mcg by mouth daily.    Calcium  Carb-Cholecalciferol (CALTRATE 600+D3) 600-20 MG-MCG TABS 2 tablet with a meal   Cholecalciferol (D3) 25 MCG (1000 UT) capsule 1 capsule Orally Once a day   Coenzyme Q10 (CO Q 10 PO) Take 800 mg by mouth daily.    levothyroxine  (SYNTHROID ) 25 MCG tablet TAKE 1 TABLET BY MOUTH EVERY DAY BEFORE BREAKFAST   Melatonin 10-10 MG TBCR 2 tablet at bedtime as needed   Multiple Vitamin (MULTIVITAMIN) tablet Take 1 tablet by mouth daily. Centrum Silver   Polyethylene Glycol 400 0.25 % SOLN Apply to eye.   polyethylene glycol powder (GLYCOLAX/MIRALAX) 17 GM/SCOOP powder Take 1 Container by mouth once.   tamoxifen  (NOLVADEX ) 20 MG tablet Take 1 tablet (20 mg total) by mouth daily.   vitamin B-12 (CYANOCOBALAMIN ) 1000 MCG tablet Take 1,000 mcg by mouth daily.  Wheat Dextrin (BENEFIBER) POWD as directed Orally   zinc gluconate 50 MG tablet Take 50 mg by mouth daily.   diphenhydrAMINE HCl, Sleep, (UNISOM SLEEPGELS) 50 MG CAPS 1 capsule at bedtime as needed (Patient not taking: Reported on 01/07/2024)   No facility-administered encounter medications on file as of 01/07/2024.    Allergies (verified) Niaspan [niacin er (antihyperlipidemic)],  Phenazopyridine hcl, and Statins   History: Past Medical History:  Diagnosis Date   Cancer (HCC) 12/2021   left breast DCIS   History of radiation therapy    Left breast- 03/30/22-04/27/22-Dr. Lynwood Nasuti   Hypothyroidism    Mitral regurgitation    Osteopenia    Personal history of radiation therapy    S/P minimally invasive mitral valve repair 03/10/2018   Complex valvuloplasty including triangular resection of posterior leaflet, artificial Gore-tex neochord x6 and Size 30 Sorin Memo 4D Ring SN [id number] via right mini thoracotomy approach   Thyroid  nodule 03/07/2018   Incidental - noted on CT scan, atypical cells found on fine-needle aspiration.  General surgery consultation and excision recommended February 2020   Past Surgical History:  Procedure Laterality Date   BREAST EXCISIONAL BIOPSY Bilateral    BREAST LUMPECTOMY Left 02/18/2022   dcis   BREAST LUMPECTOMY WITH RADIOACTIVE SEED LOCALIZATION Left 02/18/2022   Procedure: LEFT BREAST LUMPECTOMY WITH RADIOACTIVE SEED LOCALIZATION;  Surgeon: Curvin Deward MOULD, MD;  Location: [facility];  Service: General;  Laterality: Left;   MITRAL VALVE REPAIR Right 03/10/2018   Procedure: MINIMALLY INVASIVE MITRAL VALVE REPAIR (MVR):  -Triangular Resection of Posterior Leaflet P2; -Shortening Plasty of Posterior Leaflet P2; -Placement of Goretex Neo Chords x 6 using Chord-X system; -Ring Annuloplasty with a 30 mm Sorin Memo 4D Ring;  Surgeon: Dusty Sudie DEL, MD;  Location: [facility] OR;  Service: Open Heart Surgery;  Laterality: Right;   RIGHT/LEFT HEART CATH AND CORONARY ANGIOGRAPHY N/A 01/18/2018   Procedure: RIGHT/LEFT HEART CATH AND CORONARY ANGIOGRAPHY;  Surgeon: Swaziland, Peter M, MD;  Location[facility] INVASIVE CV LAB;  Service: Cardiovascular;  Laterality: N/A;   TEE WITHOUT CARDIOVERSION N/A 11/09/2017   Procedure: TRANSESOPHAGEAL ECHOCARDIOGRAM (TEE);  Surgeon: Rolan Ezra RAMAN, MD;  Location: [facility]OSCOPY;  Service: Cardiovascular;   Laterality: N/A;   TEE WITHOUT CARDIOVERSION N/A 03/10/2018   Procedure: TRANSESOPHAGEAL ECHOCARDIOGRAM (TEE);  Surgeon: Dusty Sudie DEL, MD;  Location: [facility] OR;  Service: Open Heart Surgery;  Laterality: N/A;   Family History  Problem Relation Age of Onset   Dementia Mother    Heart disease Father    Breast cancer Maternal Grandmother    Cancer Maternal Grandmother 19   Stroke Maternal Grandfather 45   Stroke Paternal Grandfather    Cancer Brother 80       ColoRectal Cancer   Cancer Brother 72       prostates to bone Estate manager/land agent   Social History   Socioeconomic History   Marital status: Widowed    Spouse name: Not on file   Number of children: 1   Years of education: Not on file   Highest education level: Not on file  Occupational History   Not on file  Tobacco Use   Smoking status: Never   Smokeless tobacco: Never  Vaping Use   Vaping status: Never Used  Substance and Sexual Activity   Alcohol use: No   Drug use: No   Sexual activity: Not Currently    Birth control/protection: Post-menopausal  Other Topics Concern   Not on file  Social History  Narrative   Stays alone. Mother in memory care unit will be 4      Bryan (50) bio son      Darice (38)   Greg (56)      Hobbies: was taking care of her mother. Taking care of the house. Walks 2 miles a day She does have a 3 are yard that she mows weeds   Social Drivers of Corporate investment banker Strain: Low Risk  (01/07/2024)   Overall Financial Resource Strain (CARDIA)    Difficulty of Paying Living Expenses: Not hard at all  Food Insecurity: No Food Insecurity (01/07/2024)   Hunger Vital Sign    Worried About Running Out of Food in the Last Year: Never true    Ran Out of Food in the Last Year: Never true  Transportation Needs: No Transportation Needs (01/07/2024)   PRAPARE - Administrator, Civil Service (Medical): No    Lack of Transportation (Non-Medical): No  Physical Activity: Sufficiently Active  (01/07/2024)   Exercise Vital Sign    Days of Exercise per Week: 7 days    Minutes of Exercise per Session: 50 min  Stress: No Stress Concern Present (01/07/2024)   [employer] of Occupational Health - Occupational Stress Questionnaire    Feeling of Stress: Not at all  Social Connections: Moderately Isolated (01/07/2024)   Social Connection and Isolation Panel    Frequency of Communication with Friends and Family: More than three times a week    Frequency of Social Gatherings with Friends and Family: More than three times a week    Attends Religious Services: More than 4 times per year    Active Member of [employer] or Organizations: No    Attends Banker Meetings: Never    Marital Status: Widowed    Tobacco Counseling Counseling given: Not Answered    Clinical Intake:  Pre-visit preparation completed: Yes  Pain : No/denies pain     BMI - recorded: 25.42 Nutritional Status: BMI 25 -29 Overweight Nutritional Risks: None Diabetes: No  Lab Results  Component Value Date   HGBA1C 5.5 03/07/2018     How often do you need to have someone help you when you read instructions, pamphlets, or other written materials from your doctor or pharmacy?: 1 - Never  Interpreter Needed?: No  Comments: lives with alone Information entered by :: B.Cindy Fullman,LPN   Activities of Daily Living ***    01/07/2024   10:23 AM  In your present state of health, do you have any difficulty performing the following activities:  Hearing? 0  Vision? 0  Difficulty concentrating or making decisions? 0  Walking or climbing stairs? 0  Dressing or bathing? 0  Doing errands, shopping? 0  Preparing Food and eating ? N  Using the Toilet? N  In the past six months, have you accidently leaked urine? N  Do you have problems with loss of bowel control? N  Managing your Medications? N  Managing your Finances? N  Housekeeping or managing your Housekeeping? N    Patient Care Team: Wendee Lynwood HERO, NP as PCP - General (Nurse Practitioner) Swazil[place], Peter M, MD as PCP - Cardiology (Cardiology) Loretha Ash, MD as Consulting Physician (Hematology and Oncology) Shannon Lynwood, MD as Consulting Physician (Radiation Oncology) Curvin Deward MOULD, MD as Consulting Physician (General Surgery) Waylan Cain, MD as Consulting Physician (Ophthalmology) *** I have updated your Care Teams any recent Medical Services you may have received from other providers in  the past year.     Assessment:   This is a routine wellness examination for Maria Gomez.  Hearing/Vision screen Hearing Screening - Comments:: Pt says her hearing is good Vision Screening - Comments:: Pt says her vision is good;readers only Dr Waylan   Goals Addressed             This Visit's Progress    Patient Stated   On track    01/07/24-Maintain current exercise regimen     Patient Stated   On track    01/07/24-To remain active and independent       Depression Screen ***    01/07/2024   10:20 AM 01/19/2023   11:45 AM 12/31/2022   10:22 AM 09/16/2022    3:45 PM 03/12/2022   10:02 AM 11/06/2021   10:23 AM 10/24/2020   10:51 AM  PHQ 2/9 Scores  PHQ - 2 Score 0 0 0 0 0 0 0  PHQ- 9 Score  2  1       Fall Risk ***    01/07/2024   10:15 AM 01/19/2023   11:46 AM 12/31/2022   10:26 AM 03/12/2022   10:02 AM 11/06/2021   10:31 AM  Fall Risk   Falls in the past year? 1 0 0 0 0  Number falls in past yr: 0 0 0 0 0  Injury with Fall? 1 0 0 0 0  Comment broke leg in two places/lft leg      Risk for fall due to : No Fall Risks No Fall Risks No Fall Risks No Fall Risks No Fall Risks  Follow up Education provided;Falls prevention discussed Falls evaluation completed Falls prevention discussed Falls prevention discussed  Falls prevention discussed      Data saved with a previous flowsheet row definition    MEDICARE RISK AT HOME: *** Medicare Risk at Home Any stairs in or around the home?: Yes If so, are there any without  handrails?: Yes Home free of loose throw rugs in walkways, pet beds, electrical cords, etc?: Yes Adequate lighting in your home to reduce risk of falls?: Yes Life alert?: No Use of a cane, walker or w/c?: No Grab bars in the bathroom?: Yes Shower chair or bench in shower?: Yes Elevated toilet seat or a handicapped toilet?: No  TIMED UP AND GO:  Was the test performed?  No  Cognitive Function: 6CIT completed        01/07/2024   10:25 AM 12/31/2022   10:20 AM 11/06/2021   10:38 AM  6CIT Screen  What Year? 0 points 0 points 0 points  What month? 0 points 0 points 0 points  What time? 0 points 0 points 0 points  Count back from 20 0 points 0 points 0 points  Months in reverse 0 points 0 points 0 points  Repeat phrase 0 points 0 points 0 points  Total Score 0 points 0 points 0 points    Immunizations Immunization History  Administered Date(s) Administered   Fluad Quad(high Dose 65+) 01/20/2019   Fluad Trivalent(High Dose 65+) 03/31/2023   Influenza, High Dose Seasonal PF 03/01/2017, 02/15/2018, 01/05/2019, 03/11/2021, 03/03/2022   Influenza-Unspecified 04/08/2012, 03/25/2013, 03/29/2014   PFIZER(Purple Top)SARS-COV-2 Vaccination 06/19/2019, 07/10/2019, 03/01/2020   Pfizer(Comirnaty)Fall Seasonal Vaccine 12 years and older 03/20/2022   Pneumococcal Conjugate-13 06/26/2013   Pneumococcal Polysaccharide-23 12/06/2018   Td 10/24/1996   Tdap 04/08/2012   Zoster Recombinant(Shingrix) 01/15/2021, 03/29/2021   Zoster, Live 05/20/2012    Screening Tests Health Maintenance  Topic  Date Due   DTaP/Tdap/Td (3 - Td or Tdap) 04/08/2022   COVID-19 Vaccine (5 - 2024-25 season) 01/24/2023   INFLUENZA VACCINE  12/24/2023   Medicare Annual Wellness (AWV)  01/06/2025   Pneumococcal Vaccine: 50+ Years  Completed   DEXA SCAN  Completed   Hepatitis C Screening  Completed   Zoster Vaccines- Shingrix  Completed   HPV VACCINES  Aged Out   Meningococcal B Vaccine  Aged Out   Pneumococcal  Vaccine  Discontinued   Colonoscopy  Discontinued    Health Maintenance  Health Maintenance Due  Topic Date Due   DTaP/Tdap/Td (3 - Td or Tdap) 04/08/2022   COVID-19 Vaccine (5 - 2024-25 season) 01/24/2023   INFLUENZA VACCINE  12/24/2023   Health Maintenance Items Addressed: None at this time  Additional Screening:  Vision Screening: Recommended annual ophthalmology exams for early detection of glaucoma and other disorders of the eye. Would you like a referral to an eye doctor? No    Dental Screening: Recommended annual dental exams for proper oral hygiene  Community Resource Referral / Chronic Care Management: CRR required this visit?  No   CCM required this visit?  {CCM Required choices:[phone]}   Plan:    I have personally reviewed and noted the following in the patient's chart:   Medical and social history Use of alcohol, tobacco or illicit drugs  Current medications and supplements including opioid prescriptions. Patient is not currently taking opioid prescriptions. Functional ability and status Nutritional status Physical activity Advanced directives List of other physicians Hospitalizations, surgeries, and ER visits in previous 12 months Vitals Screenings to include cognitive, depression, and falls Referrals and appointments  In addition, I have reviewed and discussed with patient certain preventive protocols, quality metrics, and best practice recommendations. A written personalized care plan for preventive services as well as general preventive health recommendations were provided to patient.   Erminio LITTIE Saris, LPN   1/84/7974   After Visit Summary: (MyChart) Due to this being a telephonic visit, the after visit summary with patients personalized plan was offered to patient via MyChart   Notes: {Nurse Notes:[id number]}

## 2024-01-07 NOTE — Patient Instructions (Signed)
 Maria Gomez , Thank you for taking time out of your busy schedule to complete your Annual Wellness Visit with me. I enjoyed our conversation and look forward to speaking with you again next year. I, as well as your care team,  appreciate your ongoing commitment to your health goals. Please review the following plan we discussed and let me know if I can assist you in the future. Your Game plan/ To Do List    Referrals: If you haven't heard from the office you've been referred to, please reach out to them at the phone provided.   Follow up Visits: We will see or speak with you next year for your Next Medicare AWV with our clinical staff-01/09/25 @ 10:10am televisit Have you seen your provider in the last 6 months (3 months if uncontrolled diabetes)? No  Clinician Recommendations:  Aim for 30 minutes of exercise or brisk walking, 6-8 glasses of water, and 5 servings of fruits and vegetables each day.       This is a list of the screenings recommended for you:  Health Maintenance  Topic Date Due   DTaP/Tdap/Td vaccine (3 - Td or Tdap) 04/08/2022   COVID-19 Vaccine (5 - 2024-25 season) 01/24/2023   Flu Shot  12/24/2023   Medicare Annual Wellness Visit  01/06/2025   Pneumococcal Vaccine for age over 4  Completed   DEXA scan (bone density measurement)  Completed   Hepatitis C Screening  Completed   Zoster (Shingles) Vaccine  Completed   HPV Vaccine  Aged Out   Meningitis B Vaccine  Aged Out   Pneumococcal Vaccine  Discontinued   Colon Cancer Screening  Discontinued    Advanced directives: (In Chart) A copy of your advanced directives are scanned into your chart should your provider ever need it. Advance Care Planning is important because it:  [x]  Makes sure you receive the medical care that is consistent with your values, goals, and preferences  [x]  It provides guidance to your family and loved ones and reduces their decisional burden about whether or not they are making the right  decisions based on your wishes.  Follow the link provided in your after visit summary or read over the paperwork we have mailed to you to help you started getting your Advance Directives in place. If you need assistance in completing these, please reach out to us  so that we can help you!

## 2024-01-10 ENCOUNTER — Ambulatory Visit
Admission: RE | Admit: 2024-01-10 | Discharge: 2024-01-10 | Disposition: A | Discharge status: Home / Self Care | Payer: Medicare HMO | Source: Ambulatory Visit | Attending: Hematology and Oncology | Admitting: Hematology and Oncology

## 2024-01-10 DIAGNOSIS — D0512 Intraductal carcinoma in situ of left breast: Secondary | ICD-10-CM

## 2024-01-10 DIAGNOSIS — R921 Mammographic calcification found on diagnostic imaging of breast: Secondary | ICD-10-CM | POA: Diagnosis not present

## 2024-01-17 ENCOUNTER — Other Ambulatory Visit: Payer: Self-pay | Admitting: Nurse Practitioner

## 2024-02-21 DIAGNOSIS — H2513 Age-related nuclear cataract, bilateral: Secondary | ICD-10-CM | POA: Diagnosis not present

## 2024-02-21 DIAGNOSIS — H524 Presbyopia: Secondary | ICD-10-CM | POA: Diagnosis not present

## 2024-02-21 DIAGNOSIS — H04123 Dry eye syndrome of bilateral lacrimal glands: Secondary | ICD-10-CM | POA: Diagnosis not present

## 2024-02-22 DIAGNOSIS — D0512 Intraductal carcinoma in situ of left breast: Secondary | ICD-10-CM | POA: Diagnosis not present

## 2024-03-16 ENCOUNTER — Other Ambulatory Visit: Payer: Self-pay

## 2024-03-16 MED ORDER — TAMOXIFEN CITRATE 20 MG PO TABS: 20.0000 mg | ORAL_TABLET | Freq: Every day | ORAL | 3 refills | Status: AC

## 2024-05-19 ENCOUNTER — Telehealth: Payer: Self-pay | Admitting: Nurse Practitioner

## 2024-05-19 NOTE — Telephone Encounter (Signed)
 Called lvm to call back. [employer] doe not do prior labs for physical they will be done at her physical appt. Cancel lab appt if patient calls back.

## 2024-05-30 NOTE — Telephone Encounter (Unsigned)
 Copied from CRM [id number]. Topic: Clinical - Request for Lab/Test Order >> May 30, 2024  8:30 AM Maria Gomez ORN wrote: Reason for CRM: Patient's last blood work was in August 2024. Needs new labs ordered for her annual physical for 01/22. Please call back the patient to discuss.

## 2024-05-31 ENCOUNTER — Other Ambulatory Visit

## 2024-05-31 NOTE — Telephone Encounter (Signed)
 Left detailed voicemail for patient to call the office back if there are any concerns.

## 2024-05-31 NOTE — Telephone Encounter (Signed)
 Traditionally do the labs same day of the appointment if she is ok with that

## 2024-06-01 NOTE — Telephone Encounter (Signed)
 Left detailed voicemail for patient to call the office back.

## 2024-06-02 NOTE — Telephone Encounter (Signed)
 Left message to return call to our office.  This is third call to patient no call back. Ok to [employer]

## 2024-06-05 NOTE — Telephone Encounter (Signed)
 Mailed letter to pt' address on file/.

## 2024-06-05 NOTE — Telephone Encounter (Signed)
 Letter is fine.

## 2024-06-08 ENCOUNTER — Other Ambulatory Visit: Payer: Self-pay | Admitting: Nurse Practitioner

## 2024-06-09 NOTE — Telephone Encounter (Signed)
 Patient is overdue for an office visit/CPE. Please schedule CPE in nest 30 days to continue getting refills

## 2024-06-15 ENCOUNTER — Encounter: Payer: Self-pay | Admitting: Nurse Practitioner

## 2024-06-15 ENCOUNTER — Ambulatory Visit: Admitting: Nurse Practitioner

## 2024-06-15 VITALS — BP 100/78 | HR 78 | Temp 98.5°F | Ht 61.0 in | Wt 141.8 lb

## 2024-06-15 DIAGNOSIS — M8580 Other specified disorders of bone density and structure, unspecified site: Secondary | ICD-10-CM

## 2024-06-15 DIAGNOSIS — Z0000 Encounter for general adult medical examination without abnormal findings: Secondary | ICD-10-CM

## 2024-06-15 DIAGNOSIS — E039 Hypothyroidism, unspecified: Secondary | ICD-10-CM | POA: Diagnosis not present

## 2024-06-15 DIAGNOSIS — D0512 Intraductal carcinoma in situ of left breast: Secondary | ICD-10-CM

## 2024-06-15 DIAGNOSIS — E785 Hyperlipidemia, unspecified: Secondary | ICD-10-CM | POA: Diagnosis not present

## 2024-06-15 LAB — LIPID PANEL
Cholesterol: 121 mg/dL (ref 28–200)
HDL: 53.2 mg/dL
LDL Cholesterol: 34 mg/dL (ref 10–99)
NonHDL: 67.39
Total CHOL/HDL Ratio: 2
Triglycerides: 166 mg/dL — ABNORMAL HIGH (ref 10.0–149.0)
VLDL: 33.2 mg/dL (ref 0.0–40.0)

## 2024-06-15 LAB — COMPREHENSIVE METABOLIC PANEL WITH GFR
ALT: 12 U/L (ref 3–35)
AST: 16 U/L (ref 5–37)
Albumin: 4.1 g/dL (ref 3.5–5.2)
Alkaline Phosphatase: 46 U/L (ref 39–117)
BUN: 14 mg/dL (ref 6–23)
CO2: 27 meq/L (ref 19–32)
Calcium: 9.2 mg/dL (ref 8.4–10.5)
Chloride: 105 meq/L (ref 96–112)
Creatinine, Ser: 1.1 mg/dL (ref 0.40–1.20)
GFR: 48.11 mL/min — ABNORMAL LOW
Glucose, Bld: 99 mg/dL (ref 70–99)
Potassium: 4.4 meq/L (ref 3.5–5.1)
Sodium: 140 meq/L (ref 135–145)
Total Bilirubin: 0.4 mg/dL (ref 0.2–1.2)
Total Protein: 6.7 g/dL (ref 6.0–8.3)

## 2024-06-15 LAB — CBC WITH DIFFERENTIAL/PLATELET
Basophils Absolute: 0 K/uL (ref 0.0–0.1)
Basophils Relative: 0.3 % (ref 0.0–3.0)
Eosinophils Absolute: 0 K/uL (ref 0.0–0.7)
Eosinophils Relative: 0.6 % (ref 0.0–5.0)
HCT: 42.3 % (ref 36.0–46.0)
Hemoglobin: 14 g/dL (ref 12.0–15.0)
Lymphocytes Relative: 17.2 % (ref 12.0–46.0)
Lymphs Abs: 1.2 K/uL (ref 0.7–4.0)
MCHC: 33.1 g/dL (ref 30.0–36.0)
MCV: 95.3 fl (ref 78.0–100.0)
Monocytes Absolute: 0.7 K/uL (ref 0.1–1.0)
Monocytes Relative: 9.9 % (ref 3.0–12.0)
Neutro Abs: 5.1 K/uL (ref 1.4–7.7)
Neutrophils Relative %: 72 % (ref 43.0–77.0)
Platelets: 194 K/uL (ref 150.0–400.0)
RBC: 4.44 Mil/uL (ref 3.87–5.11)
RDW: 13.5 % (ref 11.5–15.5)
WBC: 7.1 K/uL (ref 4.0–10.5)

## 2024-06-15 LAB — VITAMIN D 25 HYDROXY (VIT D DEFICIENCY, FRACTURES): VITD: 91.33 ng/mL (ref 30.00–100.00)

## 2024-06-15 LAB — TSH: TSH: 3.15 u[IU]/mL (ref 0.35–5.50)

## 2024-06-15 NOTE — Assessment & Plan Note (Signed)
 Maintained on atorvastatin  20 mg daily.  Pending lipid panel today

## 2024-06-15 NOTE — Assessment & Plan Note (Signed)
 History of same.  Maintained on levo thyroxine 25 mcg daily.  Pending TSH today

## 2024-06-15 NOTE — Assessment & Plan Note (Signed)
 Discussed age-appropriate immunizations and screening exams.  Did review patient's personal, surgical, social, family histories.  Patient up-to-date with all age-appropriate vaccinations she would like.  Patient is up-to-date and aged out of CRC screening.  Up-to-date on mammogram.  Patient along with the cervical cancer screening.  Patient was given information at discharge about preventative healthcare maintenance with anticipatory guidance.

## 2024-06-15 NOTE — Patient Instructions (Signed)
 Nice to see you today I will be in touch with the labs once I have them  Follow up with me in 1 year, sooner If you need me  Call and schedule your bone density scan at  [facility] [address] ( on hospital grounds) [place], [place]  [phone]

## 2024-06-15 NOTE — Assessment & Plan Note (Signed)
 Most recent T-score -2.3.  Patient overdue for bone density scan.  Order placed and patient given information to call and set up at her convenience.  Patient currently on vitamin D  and calcium  supplementation

## 2024-06-15 NOTE — Progress Notes (Signed)
 "  Established Patient Office Visit  Subjective   Patient ID: Maria Gomez, female    DOB: 12-Jan-1946  Age: 79 y.o. MRN: [id number]  Chief Complaint  Patient presents with   Annual Exam    HPI  HLD: Patient currently maintained on atorvastatin  20 mg daily.  Hypothyroidism: Currently maintained on levothyroxine  25 mcg daily  Breast cancer: Currently maintained on tamoxifen  20 mg daily.  Followed by oncology Dr. Loretha. Patient is also followed by general surgery Dr. Deward Null  for complete physical and follow up of chronic conditions.  Immunizations: -Tetanus: Completed in 2024 -Influenza: 03/07/2024 -Shingles: Completed Shingrix series -Pneumonia: Completed   Diet: Fair diet. She is eating breakfast, lunch, she may eat dinner if she is hungry. She is drinking coffee in the am and coke zero, water Exercise:  She was walking 2 miles prior to her leg fracture but only walking 1 mile a day   Eye exam: Completes annually.  Dental exam: Completes semi-annually    Colonoscopy: Completed in 08/16/2023, no repeat due to patient's age Lung Cancer Screening: N/A  Pap smear: Aged out  Mammogram: 01/10/2024, recommend follow-up diagnostic mammogram in 1 year  DEXA: 04/30/2022 showed osteopenia with a T-score of -2.3. ordered today  Sleep: going to bed aroud 9-930 and will get up around 6 she does have nocturia and can go back to sleep. Does feel rested   Advance directive: Does have a living will       Review of Systems  Constitutional:  Negative for chills and fever.  Respiratory:  Negative for shortness of breath.   Cardiovascular:  Negative for chest pain and leg swelling.  Gastrointestinal:  Negative for abdominal pain, blood in stool, constipation, diarrhea, nausea and vomiting.       Bm daily to every other day   Genitourinary:  Negative for dysuria and hematuria.  Neurological:  Negative for tingling and headaches.  Psychiatric/Behavioral:  Negative for  hallucinations and suicidal ideas.       Objective:     BP 100/78   Pulse 78   Temp 98.5 F (36.9 C) (Oral)   Ht 5' 1 (1.549 m)   Wt 141 lb 12.8 oz (64.3 kg)   SpO2 97%   BMI 26.79 kg/m  BP Readings from Last 3 Encounters:  06/15/24 100/78  08/31/23 111/64  06/30/23 108/72   Wt Readings from Last 3 Encounters:  06/15/24 141 lb 12.8 oz (64.3 kg)  01/07/24 139 lb (63 kg)  08/31/23 139 lb 14.4 oz (63.5 kg)   SpO2 Readings from Last 3 Encounters:  06/15/24 97%  08/31/23 93%  06/30/23 99%      Physical Exam Vitals and nursing note reviewed.  Constitutional:      Appearance: Normal appearance.  HENT:     Right Ear: Tympanic membrane, ear canal and external ear normal.     Left Ear: Tympanic membrane, ear canal and external ear normal.     Mouth/Throat:     Mouth: Mucous membranes are moist.     Pharynx: Oropharynx is clear.  Eyes:     Extraocular Movements: Extraocular movements intact.     Pupils: Pupils are equal, round, and reactive to light.  Cardiovascular:     Rate and Rhythm: Normal rate and regular rhythm.     Pulses: Normal pulses.     Heart sounds: Normal heart sounds.  Pulmonary:     Effort: Pulmonary effort is normal.     Breath sounds: Normal breath sounds.  Abdominal:     General: Bowel sounds are normal. There is no distension.     Palpations: There is no mass.     Tenderness: There is no abdominal tenderness.     Hernia: No hernia is present.  Musculoskeletal:     Right lower leg: No edema.     Left lower leg: No edema.  Lymphadenopathy:     Cervical: No cervical adenopathy.  Skin:    General: Skin is warm.  Neurological:     General: No focal deficit present.     Mental Status: She is alert.     Deep Tendon Reflexes:     Reflex Scores:      Bicep reflexes are 2+ on the right side and 2+ on the left side.      Patellar reflexes are 2+ on the right side and 2+ on the left side.    Comments: Bilateral upper and lower extremity strength  5/5  Psychiatric:        Mood and Affect: Mood normal.        Behavior: Behavior normal.        Thought Content: Thought content normal.        Judgment: Judgment normal.      No results found for any visits on 06/15/24.    The 10-year ASCVD risk score (Arnett DK, et al., 2019) is: 14%    Assessment & Plan:   Problem List Items Addressed This Visit       Endocrine   Hypothyroidism   History of same.  Maintained on levo thyroxine 25 mcg daily.  Pending TSH today      Relevant Orders   TSH     Musculoskeletal and Integument   Osteopenia   Most recent T-score -2.3.  Patient overdue for bone density scan.  Order placed and patient given information to call and set up at her convenience.  Patient currently on vitamin D  and calcium  supplementation      Relevant Orders   VITAMIN D  25 Hydroxy (Vit-D Deficiency, Fractures)   DG Bone Density     Other   Ductal carcinoma in situ (DCIS) of left breast   History of the same followed by general surgery ecology.  Patient currently maintained on tamoxifen  20 mg daily.  Continue following with specialist as recommended.  Mammogram up-to-date      Preventative health care - Primary   Discussed age-appropriate immunizations and screening exams.  Did review patient's personal, surgical, social, family histories.  Patient up-to-date with all age-appropriate vaccinations she would like.  Patient is up-to-date and aged out of CRC screening.  Up-to-date on mammogram.  Patient along with the cervical cancer screening.  Patient was given information at discharge about preventative healthcare maintenance with anticipatory guidance.      Relevant Orders   CBC with Differential/Platelet   Comprehensive metabolic panel with GFR   TSH   Hyperlipidemia   Maintained on atorvastatin  20 mg daily.  Pending lipid panel today      Relevant Orders   Lipid panel    No follow-ups on file.    Adina Crandall, NP  "

## 2024-06-15 NOTE — Assessment & Plan Note (Signed)
 History of the same followed by general surgery ecology.  Patient currently maintained on tamoxifen  20 mg daily.  Continue following with specialist as recommended.  Mammogram up-to-date

## 2024-06-19 ENCOUNTER — Ambulatory Visit: Payer: Self-pay | Admitting: Nurse Practitioner

## 2024-06-24 NOTE — Progress Notes (Unsigned)
 "   Cardiology Office Note    Date:  06/28/2024   ID:  Genevieve, Arbaugh May 24, 1946, MRN [id number]  PCP:  Wendee Lynwood HERO, NP  Cardiologist:  Dr. Maria Gallicchio  Chief Complaint  Patient presents with   s/p MV repair    History of Present Illness:  Malky Rudzinski is a 79 y.o. female seen for followup Mitral insufficiency. She was intially evaluated on 11/03/17 for evaluation of a heart murmur. She has a  PMH of hypothyroidism and HLD but no prior cardiac history.  On the previous office visit with his primary care provider in March 2018, a heart murmur was heard.  Echocardiogram obtained on 09/01/2016 showed EF 60 to 65%, mild LVH with moderate focal basal septal hypertrophy, grade 1 DD, thickened mitral leaflet with posterior leaflet prolapse, mild MR, moderate LAE, PA peak pressure was 21 mmHg.  Carotid Doppler obtained on 09/04/2016 showed no significant disease bilaterally.  Echocardiogram was repeated on 09/20/2017, this showed EF 55 to 60%, bileaflet prolapse of mitral valve with at least moderate to severe MR directed anteriorly into the left atrium, PA peak pressure 35 mmHg. She subsequently underwent TEE showing bileaflet MVP with a flail P2 segment and severe MR. She subsequently underwent [facility] on 01/18/18 confirming severe MR. Normal coronary arteries. Normal right heart pressures.   She had minimally invasive mitral valve repair on 03/10/2018 by Dr. Dusty. She did very well with this and repeat Echo showed resolution of her MR.   On follow up today she is doing well. No chest pain or SOB. No palpitations. No limitations. Notes her mother passed this year age 52.  Past Medical History:  Diagnosis Date   Cancer (HCC) 12/2021   left breast DCIS   History of radiation therapy    Left breast- 03/30/22-04/27/22-Dr. Lynwood Nasuti   Hypothyroidism    Mitral regurgitation    Osteopenia    Personal history of radiation therapy    S/P minimally invasive mitral valve repair  03/10/2018   Complex valvuloplasty including triangular resection of posterior leaflet, artificial Gore-tex neochord x6 and Size 30 Sorin Memo 4D Ring SN [id number] via right mini thoracotomy approach   Thyroid  nodule 03/07/2018   Incidental - noted on CT scan, atypical cells found on fine-needle aspiration.  General surgery consultation and excision recommended February 2020    Past Surgical History:  Procedure Laterality Date   BREAST EXCISIONAL BIOPSY Bilateral    BREAST LUMPECTOMY Left 02/18/2022   dcis   BREAST LUMPECTOMY WITH RADIOACTIVE SEED LOCALIZATION Left 02/18/2022   Procedure: LEFT BREAST LUMPECTOMY WITH RADIOACTIVE SEED LOCALIZATION;  Surgeon: Curvin Deward MOULD, MD;  Location: [facility];  Service: General;  Laterality: Left;   MITRAL VALVE REPAIR Right 03/10/2018   Procedure: MINIMALLY INVASIVE MITRAL VALVE REPAIR (MVR):  -Triangular Resection of Posterior Leaflet P2; -Shortening Plasty of Posterior Leaflet P2; -Placement of Goretex Neo Chords x 6 using Chord-X system; -Ring Annuloplasty with a 30 mm Sorin Memo 4D Ring;  Surgeon: Dusty Sudie DEL, MD;  Location: [facility] OR;  Service: Open Heart Surgery;  Laterality: Right;   RIGHT/LEFT HEART CATH AND CORONARY ANGIOGRAPHY N/A 01/18/2018   Procedure: RIGHT/LEFT HEART CATH AND CORONARY ANGIOGRAPHY;  Surgeon: Zena Vitelli M, MD;  Location: [facility] INVASIVE CV LAB;  Service: Cardiovascular;  Laterality: N/A;   TEE WITHOUT CARDIOVERSION N/A 11/09/2017   Procedure: TRANSESOPHAGEAL ECHOCARDIOGRAM (TEE);  Surgeon: Rolan Ezra RAMAN, MD;  Location: [facility]OSCOPY;  Service: Cardiovascular;  Laterality: N/A;  TEE WITHOUT CARDIOVERSION N/A 03/10/2018   Procedure: TRANSESOPHAGEAL ECHOCARDIOGRAM (TEE);  Surgeon: Dusty Sudie DEL, MD;  Location: [facility] OR;  Service: Open Heart Surgery;  Laterality: N/A;    Current Medications: Outpatient Medications Prior to Visit  Medication Sig Dispense Refill   atorvastatin  (LIPITOR) 20 MG tablet TAKE 1 TABLET  BY MOUTH EVERY DAY 90 tablet 0   Biotin 5000 MCG TABS Take 5,000 mcg by mouth daily.      Calcium  Carb-Cholecalciferol (CALTRATE 600+D3) 600-20 MG-MCG TABS 2 tablet with a meal     Cholecalciferol (D3) 25 MCG (1000 UT) capsule 1 capsule Orally Once a day     Coenzyme Q10 (CO Q 10 PO) Take 800 mg by mouth daily.      levothyroxine  (SYNTHROID ) 25 MCG tablet TAKE 1 TABLET BY MOUTH EVERY DAY BEFORE BREAKFAST 90 tablet 1   Melatonin 10-10 MG TBCR 2 tablet at bedtime as needed     Multiple Vitamin (MULTIVITAMIN) tablet Take 1 tablet by mouth daily. Centrum Silver     polyethylene glycol powder (GLYCOLAX/MIRALAX) 17 GM/SCOOP powder Take 17 g by mouth daily. Dissolve 1 capful (17g) in 4-8 ounces of liquid and take by mouth daily.     tamoxifen  (NOLVADEX ) 20 MG tablet Take 1 tablet (20 mg total) by mouth daily. 90 tablet 3   vitamin B-12 (CYANOCOBALAMIN ) 1000 MCG tablet Take 1,000 mcg by mouth daily.     Wheat Dextrin (BENEFIBER) POWD as directed Orally     zinc gluconate 50 MG tablet Take 50 mg by mouth daily.     amoxicillin (AMOXIL) 500 MG capsule SMARTSIG:4 Capsule(s) By Mouth (Patient not taking: Reported on 06/28/2024)     diphenhydrAMINE HCl, Sleep, (UNISOM SLEEPGELS) 50 MG CAPS 1 capsule at bedtime as needed (Patient not taking: Reported on 06/28/2024)     No facility-administered medications prior to visit.     Allergies:   Niaspan [niacin er (antihyperlipidemic)], Phenazopyridine hcl, and Statins   Social History   Socioeconomic History   Marital status: Widowed    Spouse name: Not on file   Number of children: 1   Years of education: Not on file   Highest education level: Not on file  Occupational History   Not on file  Tobacco Use   Smoking status: Never   Smokeless tobacco: Never  Vaping Use   Vaping status: Never Used  Substance and Sexual Activity   Alcohol use: No   Drug use: No   Sexual activity: Not Currently    Birth control/protection: Post-menopausal  Other Topics  Concern   Not on file  Social History Narrative   Stays alone. Mother in memory care unit will be 71      Bryan (50) bio son      Darice (38)   Greg (56)      Hobbies: was taking care of her mother. Taking care of the house. Walks 2 miles a day She does have a 3 are yard that she mows weeds   Social Drivers of Health   Tobacco Use: Low Risk (06/28/2024)   Patient History    Smoking Tobacco Use: Never    Smokeless Tobacco Use: Never    Passive Exposure: Not on file  Financial Resource Strain: Low Risk (01/07/2024)   Overall Financial Resource Strain (CARDIA)    Difficulty of Paying Living Expenses: Not hard at all  Food Insecurity: No Food Insecurity (01/07/2024)   Epic    Worried About Running Out of Food in the Last  Year: Never true    Ran Out of Food in the Last Year: Never true  Transportation Needs: No Transportation Needs (01/07/2024)   Epic    Lack of Transportation (Medical): No    Lack of Transportation (Non-Medical): No  Physical Activity: Sufficiently Active (01/07/2024)   Exercise Vital Sign    Days of Exercise per Week: 7 days    Minutes of Exercise per Session: 50 min  Stress: No Stress Concern Present (01/07/2024)   [employer] of Occupational Health - Occupational Stress Questionnaire    Feeling of Stress: Not at all  Social Connections: Moderately Isolated (01/07/2024)   Social Connection and Isolation Panel    Frequency of Communication with Friends and Family: More than three times a week    Frequency of Social Gatherings with Friends and Family: More than three times a week    Attends Religious Services: More than 4 times per year    Active Member of [employer] or Organizations: No    Attends Banker Meetings: Never    Marital Status: Widowed  Depression (PHQ2-9): Low Risk (06/15/2024)   Depression (PHQ2-9)    PHQ-2 Score: 2  Alcohol Screen: Low Risk (01/07/2024)   Alcohol Screen    Last Alcohol Screening Score (AUDIT): 0  Housing: Unknown  (02/22/2024)   Received from [facility]   Epic    Unable to Pay for Housing in the Last Year: Not on file    Number of Times Moved in the Last Year: Not on file    At any time in the past 12 months, were you homeless or living in a shelter (including now)?: No  Utilities: Not At Risk (01/07/2024)   Epic    Threatened with loss of utilities: No  Health Literacy: Adequate Health Literacy (01/07/2024)   B1300 Health Literacy    Frequency of need for help with medical instructions: Never     Family History:  The patient's family history includes Breast cancer in her maternal grandmother; Cancer (age of onset: 37) in her brother; Cancer (age of onset: 50) in her brother; Cancer (age of onset: 19) in her maternal grandmother; Dementia in her mother; Heart disease in her father; Stroke in her paternal grandfather; Stroke (age of onset: 46) in her maternal grandfather.   ROS:   Please see the history of present illness.    ROS All other systems reviewed and are negative.   PHYSICAL EXAM:   VS:  BP 97/66 (BP Location: Left Arm, Patient Position: Sitting, Cuff Size: Normal)   Pulse 71   Ht 5' 1 (1.549 m)   Wt 144 lb 8 oz (65.5 kg)   SpO2 97%   BMI 27.30 kg/m    GENERAL:  NAD HEENT:  normal.  NECK:  No jugular venous distention, carotid upstroke brisk and symmetric, no bruits, no thyromegaly or adenopathy LUNGS:  Clear to auscultation bilaterally CHEST:  Unremarkable HEART:  RRR,  PMI not displaced or sustained,S1 and S2 within normal limits, no S3, no S4: no clicks, no rubs, no murmur ABD:  Soft, nontender. BS +, no masses or bruits. No hepatomegaly, no splenomegaly EXT:  2 + pulses throughout, no edema, no cyanosis no clubbing. Radial cath site has healed well. SKIN:  Warm and dry.  No rashes NEURO:  Alert and oriented x 3. Cranial nerves II through XII intact. PSYCH:  Cognitively intact    Wt Readings from Last 3 Encounters:  06/28/24 144 lb 8 oz (65.5 kg)  06/15/24 141 lb 12.8 oz (64.3 kg)  01/07/24 139 lb (63 kg)      Studies/Labs Reviewed:   EKG Interpretation Date/Time:  Wednesday June 28 2024 14:38:07 EST Ventricular Rate:  70 PR Interval:  154 QRS Duration:  62 QT Interval:  430 QTC Calculation: 464 R Axis:   -38  Text Interpretation: Sinus rhythm with Premature atrial complexes Left axis deviation Low voltage QRS When compared with ECG of 30-Jun-2023 10:09, QRS axis Shifted left Confirmed by Breylen Agyeman [phone]) on 06/28/2024 2:43:37 PM     Recent Labs: 06/15/2024: ALT 12; BUN 14; Creatinine, Ser 1.10; Hemoglobin 14.0; Platelets 194.0; Potassium 4.4; Sodium 140; TSH 3.15   Lipid Panel    Component Value Date/Time   CHOL 121 06/15/2024 1041   TRIG 166.0 (H) 06/15/2024 1041   HDL 53.20 06/15/2024 1041   CHOLHDL 2 06/15/2024 1041   VLDL 33.2 06/15/2024 1041   LDLCALC 34 06/15/2024 1041   LDLCALC 69 03/09/2022 0954   LDLDIRECT 66.0 01/19/2023 1221  EKG Interpretation Date/Time:  Wednesday June 28 2024 14:38:07 EST Ventricular Rate:  70 PR Interval:  154 QRS Duration:  62 QT Interval:  430 QTC Calculation: 464 R Axis:   -38  Text Interpretation: Sinus rhythm with Premature atrial complexes Left axis deviation Low voltage QRS When compared with ECG of 30-Jun-2023 10:09, QRS axis Shifted left Confirmed by Thelbert Gartin [phone]) on 06/28/2024 2:43:37 PM    Additional studies/ records that were reviewed today include:   Echo 09/01/2016 LV EF: 60% -   65% Study Conclusions   - Left ventricle: The cavity size was normal. Wall thickness was   increased in a pattern of mild LVH. There was moderate focal   basal hypertrophy of the septum. Systolic function was normal.   The estimated ejection fraction was in the range of 60% to 65%.   Wall motion was normal; there were no regional wall motion   abnormalities. Doppler parameters are consistent with abnormal   left ventricular relaxation (grade 1 diastolic  dysfunction). The   E/e&' ratio is >15, suggesting elevated LV filling pressure. - Aortic valve: Trileaflet. Sclerosis without stenosis. There was   no regurgitation. - Mitral valve: Thickened leaflets with posterior leaflet prolapse.   Trace to mild regurgitation. - Left atrium: The atrium was moderately dilated. - Tricuspid valve: There was trivial regurgitation. - Pulmonary arteries: PA peak pressure: 21 mm Hg (S). - Inferior vena cava: The vessel was normal in size. The   respirophasic diameter changes were in the normal range (>= 50%),   consistent with normal central venous pressure.   Impressions:   - LVEF 60-65%, mild LVH with moderate focal basal septal   hypertrophy, normal wall motion, grade 1 DD with elevated LV   filling pressure, aortic valve sclerosis, posterior mitral valve   leaflet prolapse with mild to moderate anteriorly directed mitral   regurgitation, moderate LAE, trivial TR, RVSP 21 mmHg, normal   IVC.   Echo 09/20/2017 LV EF: 55% -   60% Study Conclusions   - Left ventricle: The cavity size was normal. Wall thickness was   increased in a pattern of mild LVH. Systolic function was normal.   The estimated ejection fraction was in the range of 55% to 60%. - Mitral valve: There is bileaflet prolapse of the MV MR is   directed anteirorly into the left atrium IT is at least   moderately severe . - Left atrium: The atrium was mildly dilated. - Pulmonary  arteries: PA peak pressure: 35 mm Hg (S).  TEE 11/09/17: Study Conclusions   - Left ventricle: The cavity size was normal. Wall thickness was   normal. The estimated ejection fraction was 60%. Wall motion was   normal; there were no regional wall motion abnormalities. - Aortic valve: There was no stenosis. There was trivial   regurgitation. - Aorta: Normal caliber aorta with minimal plaque. - Left atrium: The atrium was mildly dilated. No evidence of   thrombus in the atrial cavity or appendage. - Right  ventricle: The cavity size was normal. Systolic function   was normal. - Right atrium: No evidence of thrombus in the atrial cavity or   appendage. - Atrial septum: No ASD/PFO by color doppler. - Tricuspid valve: Peak RV-RA gradient (S): 18 mm Hg. - Impressions: Severe mitral regurgitation in setting of bileaflet   MVP and partial flail P2 scallop.   Impressions:   - Severe mitral regurgitation in setting of bileaflet MVP and   partial flail P2 scallop.  Cardiac cath 01/18/18: Procedures   RIGHT/LEFT HEART CATH AND CORONARY ANGIOGRAPHY  Conclusion     The left ventricular systolic function is normal. LV end diastolic pressure is normal. The left ventricular ejection fraction is 55-65% by visual estimate. There is severe (4+) mitral regurgitation and moderate mitral valve prolapse.   1. Normal coronary anatomy 2. Normal LV function 3. Mitral valve prolapse with severe MR 3-4+ 4. Normal right heart pressures 5. Normal LV filling pressures   No indication for antiplatelet therapy at this time.    Will refer to Dr. Dusty for consideration of MV repair   Minimally-Invasive Mitral Valve Repair             Complex valvuloplasty including triangular resection of flail segment (P2) of posterior leaflet             Shortening plasty of posterior leaflet             Artificial Gore-tex neochord placement x6             Sorin Memo 4D Ring Annuloplasty (size 30mm, catalog # [id number], serial # [id number])   Echo 05/12/18: Study Conclusions   - Left ventricle: The cavity size was normal. Wall thickness was    normal. Systolic function was normal. The estimated ejection    fraction was in the range of 50% to 55%. Wall motion was normal;    there were no regional wall motion abnormalities. Doppler    parameters are consistent with abnormal left ventricular    relaxation (grade 1 diastolic dysfunction).  - Aortic valve: There was trivial regurgitation.  - Mitral valve: Prior procedures  included surgical repair.   Impressions:   - Low normal LV systolic function; mild diastolic dysfunction;    trace AI; s/p MV repair with no MR.   ASSESSMENT:    1. Hyperlipidemia, unspecified hyperlipidemia type   2. Severe mitral valve regurgitation   3. S/P minimally invasive mitral valve repair        PLAN:  In order of problems listed above:  Mitral valve regurgitation secondary to partially flail P2 segment of the MV with MV prolapse. Now s/p MV repair in October 2019. Follow up Echo showed excellent result. Exam is normal.   Hyperlipidemia: excellent control on lipitor.  Hypothyroidism: TSH normal.   Follow up in one year.      Medication Adjustments/Labs and Tests Ordered: Current medicines are reviewed at length with the patient today.  Concerns  regarding medicines are outlined above.  Medication changes, Labs and Tests ordered today are listed in the Patient Instructions below. Patient Instructions  Medication Instructions:   *If you need a refill on your cardiac medications before your next appointment, please call your pharmacy*  Lab Work:    Testing/Procedures:   Follow-Up: At [facility], you and your health needs are our priority.  As part of our continuing mission to provide you with exceptional heart care, our providers are all part of one team.  This team includes your primary Cardiologist (physician) and Advanced Practice Providers or APPs (Physician Assistants and Nurse Practitioners) who all work together to provide you with the care you need, when you need it.  Your next appointment:      Provider:     We recommend signing up for the patient portal called MyChart.  Sign up information is provided on this After Visit Summary.  MyChart is used to connect with patients for Virtual Visits (Telemedicine).  Patients are able to view lab/test results, encounter notes, upcoming appointments, etc.  Non-urgent messages can be sent to your  provider as well.   To learn more about what you can do with MyChart, go to [email or web address].             Signed, Ariatna Jester, MD  06/28/2024 2:50 PM    [facility] [address] [place], [place], [place]  [address] Phone: [phone]; Fax: [phone]   "

## 2024-06-28 ENCOUNTER — Ambulatory Visit: Admitting: Cardiology

## 2024-06-28 ENCOUNTER — Encounter: Payer: Self-pay | Admitting: Cardiology

## 2024-06-28 VITALS — BP 97/66 | HR 71 | Ht 61.0 in | Wt 144.5 lb

## 2024-06-28 DIAGNOSIS — E785 Hyperlipidemia, unspecified: Secondary | ICD-10-CM | POA: Diagnosis not present

## 2024-06-28 DIAGNOSIS — Z9889 Other specified postprocedural states: Secondary | ICD-10-CM

## 2024-06-28 DIAGNOSIS — I340 Nonrheumatic mitral (valve) insufficiency: Secondary | ICD-10-CM | POA: Diagnosis not present

## 2024-06-28 NOTE — Patient Instructions (Addendum)
 Medication Instructions:  Continue same medications *If you need a refill on your cardiac medications before your next appointment, please call your pharmacy*  Lab Work: None ordered   Testing/Procedures: None ordered  Follow-Up: At [facility], you and your health needs are our priority.  As part of our continuing mission to provide you with exceptional heart care, our providers are all part of one team.  This team includes your primary Cardiologist (physician) and Advanced Practice Providers or APPs (Physician Assistants and Nurse Practitioners) who all work together to provide you with the care you need, when you need it.  Your next appointment:  1 year   Call in Nov to schedule Feb appointment     Provider:  Dr.Jordan   We recommend signing up for the patient portal called MyChart.  Sign up information is provided on this After Visit Summary.  MyChart is used to connect with patients for Virtual Visits (Telemedicine).  Patients are able to view lab/test results, encounter notes, upcoming appointments, etc.  Non-urgent messages can be sent to your provider as well.   To learn more about what you can do with MyChart, go to [email or web address].

## 2024-07-12 ENCOUNTER — Other Ambulatory Visit

## 2024-08-31 ENCOUNTER — Ambulatory Visit: Admitting: Hematology and Oncology

## 2025-01-09 ENCOUNTER — Ambulatory Visit

## 2025-06-18 ENCOUNTER — Encounter: Admitting: Nurse Practitioner
